# Patient Record
Sex: Female | Born: 1990 | Race: White | Hispanic: No | Smoking: Current every day smoker
Health system: Southern US, Community
[De-identification: ages and names within clinical notes are randomized; demographics above are authoritative.]

## PROBLEM LIST (undated history)

## (undated) ENCOUNTER — Emergency Department (HOSPITAL_COMMUNITY): Admission: EM | Payer: Medicaid Other | Source: Home / Self Care

## (undated) DIAGNOSIS — F329 Major depressive disorder, single episode, unspecified: Secondary | ICD-10-CM

## (undated) DIAGNOSIS — K219 Gastro-esophageal reflux disease without esophagitis: Secondary | ICD-10-CM

## (undated) DIAGNOSIS — K589 Irritable bowel syndrome without diarrhea: Secondary | ICD-10-CM

## (undated) DIAGNOSIS — R519 Headache, unspecified: Secondary | ICD-10-CM

## (undated) DIAGNOSIS — Z8614 Personal history of Methicillin resistant Staphylococcus aureus infection: Secondary | ICD-10-CM

## (undated) DIAGNOSIS — K8020 Calculus of gallbladder without cholecystitis without obstruction: Secondary | ICD-10-CM

## (undated) DIAGNOSIS — F32A Depression, unspecified: Secondary | ICD-10-CM

## (undated) DIAGNOSIS — J45909 Unspecified asthma, uncomplicated: Secondary | ICD-10-CM

## (undated) DIAGNOSIS — F419 Anxiety disorder, unspecified: Secondary | ICD-10-CM

## (undated) DIAGNOSIS — R51 Headache: Secondary | ICD-10-CM

## (undated) DIAGNOSIS — N83209 Unspecified ovarian cyst, unspecified side: Secondary | ICD-10-CM

## (undated) DIAGNOSIS — L309 Dermatitis, unspecified: Secondary | ICD-10-CM

## (undated) HISTORY — DX: Irritable bowel syndrome, unspecified: K58.9

## (undated) HISTORY — PX: WISDOM TOOTH EXTRACTION: SHX21

## (undated) HISTORY — DX: Dermatitis, unspecified: L30.9

## (undated) HISTORY — PX: TUBAL LIGATION: SHX77

## (undated) HISTORY — PX: OVARIAN CYST REMOVAL: SHX89

---
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
## 2010-03-14 ENCOUNTER — Emergency Department (HOSPITAL_COMMUNITY): Admission: EM | Admit: 2010-03-14 | Discharge: 2010-03-14 | Payer: Self-pay | Admitting: Emergency Medicine

## 2010-12-06 LAB — URINALYSIS, ROUTINE W REFLEX MICROSCOPIC
Bilirubin Urine: NEGATIVE
Glucose, UA: NEGATIVE mg/dL
Hgb urine dipstick: NEGATIVE
Nitrite: NEGATIVE
Protein, ur: NEGATIVE mg/dL

## 2010-12-06 LAB — URINE CULTURE

## 2010-12-06 LAB — URINE MICROSCOPIC-ADD ON

## 2011-02-09 ENCOUNTER — Encounter: Payer: Self-pay | Admitting: Advanced Practice Midwife

## 2011-02-09 ENCOUNTER — Encounter: Payer: Self-pay | Admitting: Obstetrics & Gynecology

## 2011-02-09 ENCOUNTER — Other Ambulatory Visit: Payer: Self-pay | Admitting: Obstetrics & Gynecology

## 2011-02-09 DIAGNOSIS — N83202 Unspecified ovarian cyst, left side: Secondary | ICD-10-CM | POA: Insufficient documentation

## 2011-02-09 MED ORDER — VICODIN 5-500 MG PO TABS: 1.0000 | ORAL_TABLET | Freq: Four times a day (QID) | ORAL | Status: DC | PRN

## 2011-02-09 NOTE — Progress Notes (Signed)
  G1P1001 had Mirena IUD placed 2/11.  Ever since then, she has experienced intermittent abdominal pain, which she describes as crampy "like a period".  She also c/o gaining 15 lbs and abd swelling.  U/S shows what appears to be a large cyst.  Dr. Despina Hidden called in for consult.

## 2011-02-09 NOTE — Progress Notes (Signed)
  Drenda Freeze Cresenzo-Dishmon,CNM requested that I perform a vaginal ultrasound on the patient. Vaginal probe ultrasound revealed a normal uterus anteverted with a Mirena IUD appropriately in the endometrial cavity the left ovary was dramatically somewhat complex cyst measuring 5.6 x 5.6 cm was present.  Another smaller cyst was also present on the ovary approximately 1-1/2 cm. Air was no free fluid in the pelvis the right ovary could not be visualized  The patient has been having increasing lower abdominal pain over the past several months she had been relating to her IUD but it is my impression that is due to the ovarian cyst. Also this complex echogenic ultrasound findings and incised and this is very unlikely to be a moderately responsive functional ovarian cyst. As a result her talking with the patient we will proceed with a I prescribed that evaluation on Wednesday, May 30 plans to do a laparoscopic left ovarian cystectomy the patient understands that she may require an oophorectomy but we will certainly try to avoid this.  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 At this point there is no need to remove the Mirena IUD and the patient was to keep it in as well all questions were answered she was scheduled for Wednesday, May 30 at [facility].

## 2011-02-12 ENCOUNTER — Other Ambulatory Visit: Payer: Self-pay | Admitting: Obstetrics & Gynecology

## 2011-02-12 ENCOUNTER — Encounter (HOSPITAL_COMMUNITY): Payer: Medicaid Other

## 2011-02-12 ENCOUNTER — Other Ambulatory Visit: Payer: Self-pay | Admitting: Infectious Diseases

## 2011-02-12 LAB — URINALYSIS, ROUTINE W REFLEX MICROSCOPIC
Ketones, ur: NEGATIVE mg/dL
Nitrite: NEGATIVE
Protein, ur: NEGATIVE mg/dL
Specific Gravity, Urine: 1.03 (ref 1.005–1.030)
Urobilinogen, UA: 0.2 mg/dL (ref 0.0–1.0)
pH: 6 (ref 5.0–8.0)

## 2011-02-12 LAB — COMPREHENSIVE METABOLIC PANEL
AST: 18 U/L (ref 0–37)
Calcium: 10.1 mg/dL (ref 8.4–10.5)
Creatinine, Ser: 0.63 mg/dL (ref 0.4–1.2)
GFR calc Af Amer: >60 mL/min (ref >60)
Glucose, Bld: 105 mg/dL — ABNORMAL HIGH (ref 70–99)
Sodium: 141 mEq/L (ref 135–145)
Total Protein: 6.4 g/dL (ref 6.0–8.3)

## 2011-02-12 LAB — CBC
Hemoglobin: 12.7 g/dL (ref 12.0–15.0)
MCH: 30.5 pg (ref 26.0–34.0)
MCHC: 33.7 g/dL (ref 30.0–36.0)
RBC: 4.16 MIL/uL (ref 3.87–5.11)
WBC: 10.8 10*3/uL — ABNORMAL HIGH (ref 4.0–10.5)

## 2011-02-12 LAB — URINE MICROSCOPIC-ADD ON

## 2011-02-16 ENCOUNTER — Encounter: Payer: Self-pay | Admitting: Obstetrics & Gynecology

## 2011-02-17 ENCOUNTER — Ambulatory Visit (HOSPITAL_COMMUNITY)
Admission: RE | Admit: 2011-02-17 | Discharge: 2011-02-17 | Disposition: A | Discharge status: Home / Self Care | Payer: Medicaid Other | Source: Ambulatory Visit | Attending: Obstetrics & Gynecology | Admitting: Obstetrics & Gynecology

## 2011-02-17 ENCOUNTER — Other Ambulatory Visit: Payer: Self-pay | Admitting: Obstetrics & Gynecology

## 2011-02-17 DIAGNOSIS — N83209 Unspecified ovarian cyst, unspecified side: Secondary | ICD-10-CM | POA: Insufficient documentation

## 2011-03-03 NOTE — Op Note (Signed)
Karen, Shields              ACCOUNT NO.:  [id number]  MEDICAL RECORD NO.:  [id number]           PATIENT TYPE:  O  LOCATION:  DAYP                          FACILITY:  [facility]  PHYSICIAN:  Lazaro Arms, M.D.   DATE OF BIRTH:  12-01-1990  DATE OF PROCEDURE:  02/17/2011 DATE OF DISCHARGE:                              OPERATIVE REPORT   PREOPERATIVE DIAGNOSIS:  A 9-cm left ovarian cyst, complex.  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  PROCEDURE:  Laparoscopic left ovarian cystectomy.  SURGEON:  Lazaro Arms, MD  ANESTHESIA:  General endotracheal.  FINDINGS:  The patient had a 9-cm left ovarian cyst complex found in the office.  It was heterogeneous, no internal septations.  I thought it was probably consistent with a serous cystadenoma.  Today, at surgery that was confirmed.  It was indeed in the left, and I think it was a serous cystadenoma.  The right ovary had a small probably 2 cm simple cyst onit, which was benign.  The uterus was normal.  The intraperitoneal cavity was otherwise normal.  DESCRIPTION OF OPERATION:  The patient was taken to the operating room, placed in supine position where she underwent general endotracheal anesthesia.  She was then placed in lithotomy position, prepped and draped in usual sterile fashion.  A Foley catheter was placed.  Incision was made in the umbilicus, carried down sharply to rectus fascia. Veress needle was placed in peritoneal cavity with one pass without any difficulty.  The peritoneal cavity was insufflated.  A non-bladed video laparoscope trocar was then placed into the peritoneal cavity with one pass without any difficulty, and incision was made in the lower left lower quadrant and also in the midline just above the pubis and non- bladed trocars were then placed into the peritoneal cavity under direct visualization without difficulty.  The Harmonic scalpel counter traction and pressurized  hydrodissection was used and the ovarian shell, cortex shell was opened up with no cyst rupture.  I was able to basically bivalve the ovary and use the hydrodissection in an avascular fashion to shell out the ovarian cyst.  I then ruptured the cyst and removed the fluid and retrieved the ovarian cyst using the EndoCatch without difficulty.  The entire cyst was removed.  The ovarian bed where the cyst was bleeding minimally, little pressure took care of that and no ovarian tissue had to be removed during the procedure.  After I found it to be hemostatic, I folded it back together and put it down in the posterior cul-de-sac and left fluid in there to try to allow healing without adhesion formation.  There was a small probably 2-3 cm simple cyst of the right ovary, which was ovarian was just opened and the fluid removed, and it was straw colored fluid consistent with a corpus luteum. The patient does have an IUD in place.  The instruments were then removed.  All the gas was allowed to escape.  The umbilical fascia was closed with single 0 Vicryl suture, and the skin staples were closed with staples.  Unfortunately, I was going to close it with  subcu suture and Dermabond, but they were all sort of the oozing, so I brought it in her best interest to close it with staples.  The patient tolerated the procedure well.  She experienced about 50 mL blood loss and was taken to the recovery room in good stable condition.  All counts were correct.  She received Ancef and Toradol preoperatively prophylactically.     Lazaro Arms, M.D.     Loraine Maple  D:  02/17/2011  T:  02/18/2011  Job:  [id number]  Electronically Signed by Duane Lope M.D. on 03/03/2011 02:51:08 PM

## 2011-06-11 LAB — URINALYSIS, ROUTINE W REFLEX MICROSCOPIC
Bilirubin Urine: NEGATIVE
Glucose, UA: NEGATIVE
Nitrite: POSITIVE — AB
Protein, ur: NEGATIVE
pH: 7

## 2011-06-11 LAB — BASIC METABOLIC PANEL
CO2: 23
Calcium: 8.8
Chloride: 108
Glucose, Bld: 75
Sodium: 135

## 2011-06-11 LAB — URINE MICROSCOPIC-ADD ON

## 2011-06-15 LAB — URINALYSIS, ROUTINE W REFLEX MICROSCOPIC
Glucose, UA: NEGATIVE
Nitrite: NEGATIVE
Protein, ur: NEGATIVE
Urobilinogen, UA: 0.2

## 2011-06-15 LAB — URINE MICROSCOPIC-ADD ON

## 2011-06-18 LAB — CBC
HCT: 39.1
MCHC: 34.2
MCV: 91.6
RDW: 12.2
WBC: 10

## 2011-06-18 LAB — WET PREP, GENITAL

## 2011-06-18 LAB — URINALYSIS, ROUTINE W REFLEX MICROSCOPIC
Bilirubin Urine: NEGATIVE
Nitrite: NEGATIVE

## 2011-06-18 LAB — DIFFERENTIAL
Basophils Absolute: 0
Eosinophils Absolute: 0.5
Lymphocytes Relative: 35
Monocytes Absolute: 0.8
Monocytes Relative: 8
Neutro Abs: 5.2
Neutrophils Relative %: 52

## 2011-06-18 LAB — BASIC METABOLIC PANEL: Chloride: 108

## 2011-06-18 LAB — URINE MICROSCOPIC-ADD ON

## 2011-06-18 LAB — RPR: RPR Ser Ql: NONREACTIVE

## 2011-08-31 ENCOUNTER — Emergency Department (HOSPITAL_COMMUNITY)
Admission: EM | Admit: 2011-08-31 | Discharge: 2011-09-01 | Disposition: A | Discharge status: Home / Self Care | Payer: BC Managed Care – PPO | Attending: Emergency Medicine | Admitting: Emergency Medicine

## 2011-08-31 ENCOUNTER — Encounter (HOSPITAL_COMMUNITY): Payer: Self-pay | Admitting: *Deleted

## 2011-08-31 DIAGNOSIS — F17200 Nicotine dependence, unspecified, uncomplicated: Secondary | ICD-10-CM | POA: Insufficient documentation

## 2011-08-31 DIAGNOSIS — R1032 Left lower quadrant pain: Secondary | ICD-10-CM | POA: Insufficient documentation

## 2011-08-31 DIAGNOSIS — J45909 Unspecified asthma, uncomplicated: Secondary | ICD-10-CM | POA: Insufficient documentation

## 2011-08-31 HISTORY — DX: Unspecified ovarian cyst, unspecified side: N83.209

## 2011-08-31 NOTE — ED Notes (Signed)
Reports history of ovarian cycts, and bladder pressure.  Reports that she presently is experiencing same cramping as previously experienced with cysts.

## 2011-09-01 ENCOUNTER — Encounter (HOSPITAL_COMMUNITY): Payer: Self-pay | Admitting: Emergency Medicine

## 2011-09-01 LAB — URINALYSIS, ROUTINE W REFLEX MICROSCOPIC
Bilirubin Urine: NEGATIVE
Glucose, UA: NEGATIVE mg/dL
Nitrite: NEGATIVE
Specific Gravity, Urine: >1.03 — ABNORMAL HIGH (ref 1.005–1.030)
pH: 5.5 (ref 5.0–8.0)

## 2011-09-01 LAB — URINE MICROSCOPIC-ADD ON

## 2011-09-01 LAB — PREGNANCY, URINE: Preg Test, Ur: NEGATIVE

## 2011-09-01 MED ORDER — IBUPROFEN 800 MG PO TABS: 800.0000 mg | ORAL_TABLET | Freq: Three times a day (TID) | ORAL | Status: AC

## 2011-09-01 MED ORDER — ONDANSETRON HCL 4 MG PO TABS
4.0000 mg | ORAL_TABLET | Freq: Once | ORAL | Status: AC
  Administered 2011-09-01: 4 mg via ORAL
  Filled 2011-09-01: qty 1

## 2011-09-01 MED ORDER — HYDROCODONE-ACETAMINOPHEN 5-325 MG PO TABS
2.0000 | ORAL_TABLET | Freq: Once | ORAL | Status: AC
  Administered 2011-09-01: 2 via ORAL
  Filled 2011-09-01: qty 2

## 2011-09-01 MED ORDER — IBUPROFEN 800 MG PO TABS
800.0000 mg | ORAL_TABLET | Freq: Once | ORAL | Status: AC
  Administered 2011-09-01: 800 mg via ORAL
  Filled 2011-09-01: qty 1

## 2011-09-01 MED ORDER — HYDROCODONE-ACETAMINOPHEN 5-325 MG PO TABS: ORAL_TABLET | ORAL | Status: DC

## 2011-09-01 NOTE — ED Provider Notes (Signed)
History     CSN: [id number] Arrival date & time: 08/31/2011 11:57 PM   None     Chief Complaint  Patient presents with  . Pelvic Pain    (Consider location/radiation/quality/duration/timing/severity/associated sxs/prior treatment) HPI Comments: Patient reports cramping type pain in the left lower abdomen similar to previous cramping type pain with ovarian cyst. The patient denies any fever or injury to the lower abdomen area. She has a pressure sensation in the bladder area. Denies hematuria. Pain is currently 6/10.  Patient is a 20 y.o. female presenting with pelvic pain. The history is provided by the patient.  Pelvic Pain The current episode started more than 1 month ago. The problem occurs intermittently. The problem has been unchanged. Associated symptoms include abdominal pain. Pertinent negatives include no arthralgias, chest pain, coughing, nausea, neck pain or vomiting. The symptoms are aggravated by walking. She has tried nothing for the symptoms. The treatment provided no relief.    Past Medical History  Diagnosis Date  . Eczema 2008  . Asthma   . Ovarian cyst     History reviewed. No pertinent past surgical history.  Family History  Problem Relation Age of Onset  . Diabetes Mother     History  Substance Use Topics  . Smoking status: Current Everyday Smoker -- 0.1 packs/day for 5 years    Types: Cigarettes  . Smokeless tobacco: Never Used  . Alcohol Use: No    OB History    Grav Para Term Preterm Abortions TAB SAB Ect Mult Living   1 1 0 0 0 0 0 0 0 1       Review of Systems  Constitutional: Negative for activity change.       All ROS Neg except as noted in HPI  HENT: Negative for nosebleeds and neck pain.   Eyes: Negative for photophobia and discharge.  Respiratory: Negative for cough, shortness of breath and wheezing.   Cardiovascular: Negative for chest pain and palpitations.  Gastrointestinal: Positive for abdominal pain. Negative for nausea,  vomiting and blood in stool.  Genitourinary: Positive for pelvic pain. Negative for dysuria, frequency and hematuria.  Musculoskeletal: Negative for back pain and arthralgias.  Skin: Negative.   Neurological: Negative for dizziness, seizures and speech difficulty.  Psychiatric/Behavioral: Negative for hallucinations and confusion.    Allergies  Review of patient's allergies indicates no known allergies.  Home Medications   Current Outpatient Rx  Name Route Sig Dispense Refill  . ALPRAZOLAM 0.5 MG PO TABS Oral Take 0.5 mg by mouth at bedtime as needed.      . ATOMOXETINE HCL 80 MG PO CAPS Oral Take 80 mg by mouth daily.      Marland Kitchen ESCITALOPRAM OXALATE 10 MG PO TABS Oral Take 20 mg by mouth daily.      Marland Kitchen VICODIN 5-500 MG PO TABS Oral Take 1 tablet by mouth every 6 (six) hours as needed for pain. 24 tablet 0    Dispense as written.    BP 134/76  Pulse 80  Temp 98.6 F (37 C)  Resp 18  Ht 5\' 4"  (1.626 m)  Wt 145 lb (65.772 kg)  BMI 24.89 kg/m2  SpO2 100%  LMP 08/17/2011  Physical Exam  Nursing note and vitals reviewed. Constitutional: She is oriented to person, place, and time. She appears well-developed and well-nourished.  Non-toxic appearance.  HENT:  Head: Normocephalic.  Right Ear: Tympanic membrane and external ear normal.  Left Ear: Tympanic membrane and external ear normal.  Eyes: EOM and  lids are normal. Pupils are equal, round, and reactive to light.  Neck: Normal range of motion. Neck supple. Carotid bruit is not present.  Cardiovascular: Normal rate, regular rhythm, normal heart sounds, intact distal pulses and normal pulses.   Pulmonary/Chest: Breath sounds normal. No respiratory distress.  Abdominal: Soft. Bowel sounds are normal. There is no tenderness. There is no guarding and no CVA tenderness.       Mild-to-moderate left lower quadrant abdomen pain extending to the supra-pubic area. No guarding and no rebound.  Musculoskeletal: Normal range of motion.    Lymphadenopathy:       Head (right side): No submandibular adenopathy present.       Head (left side): No submandibular adenopathy present.    She has no cervical adenopathy.  Neurological: She is alert and oriented to person, place, and time. She has normal strength. No cranial nerve deficit or sensory deficit.  Skin: Skin is warm and dry.  Psychiatric: She has a normal mood and affect. Her speech is normal.    ED Course  Procedures (including critical care time)  Labs Reviewed - No data to display No results found.   UJ:WJXB Lower abd pain   MDM  I have examined the patient reviewed the vital signs and the lab work for today's visit. The patient presented with pain similar to ovarian cyst pain in the pass she has not had nausea or vomiting. She's not had hematuria. There is no diarrhea or constipation reported. No fever. No history of trauma. During the visit in the emergency room the patient has been calm has been conversing with family and takes things on the phone without major pain or complication. The urine pregnancy test is negative. The urinalysis is negative for urinary tract infection and or kidney stone. Prescription for ibuprofen 800 mg 13 times daily and Norco one every 4 hours for pain given. Patient to see her OB/GYN physician or physician at the [facility] clinic for ultrasound evaluation. Patient to return to the emergency department if any changes problems or concerns.        Kathie Dike, [place] 09/01/11 [phone]

## 2011-09-01 NOTE — ED Provider Notes (Signed)
Medical screening examination/treatment/procedure(s) were performed by non-physician practitioner and as supervising physician I was immediately available for consultation/collaboration.   Shelda Jakes, MD 09/01/11 [phone]

## 2011-09-03 ENCOUNTER — Emergency Department (HOSPITAL_COMMUNITY): Payer: BC Managed Care – PPO

## 2011-09-03 ENCOUNTER — Encounter (HOSPITAL_COMMUNITY): Payer: Self-pay

## 2011-09-03 ENCOUNTER — Emergency Department (HOSPITAL_COMMUNITY)
Admission: EM | Admit: 2011-09-03 | Discharge: 2011-09-03 | Disposition: A | Discharge status: Home / Self Care | Payer: BC Managed Care – PPO | Attending: Emergency Medicine | Admitting: Emergency Medicine

## 2011-09-03 DIAGNOSIS — J45909 Unspecified asthma, uncomplicated: Secondary | ICD-10-CM | POA: Insufficient documentation

## 2011-09-03 DIAGNOSIS — R1031 Right lower quadrant pain: Secondary | ICD-10-CM | POA: Insufficient documentation

## 2011-09-03 DIAGNOSIS — F17200 Nicotine dependence, unspecified, uncomplicated: Secondary | ICD-10-CM | POA: Insufficient documentation

## 2011-09-03 DIAGNOSIS — N83209 Unspecified ovarian cyst, unspecified side: Secondary | ICD-10-CM | POA: Insufficient documentation

## 2011-09-03 LAB — CBC
MCH: 31.2 pg (ref 26.0–34.0)
MCHC: 33.9 g/dL (ref 30.0–36.0)
Platelets: 340 10*3/uL (ref 150–400)
RBC: 4.43 MIL/uL (ref 3.87–5.11)

## 2011-09-03 LAB — DIFFERENTIAL
Basophils Relative: 0 % (ref 0–1)
Eosinophils Absolute: 0.1 10*3/uL (ref 0.0–0.7)
Neutro Abs: 8.4 10*3/uL — ABNORMAL HIGH (ref 1.7–7.7)
Neutrophils Relative %: 74 % (ref 43–77)

## 2011-09-03 LAB — URINALYSIS, ROUTINE W REFLEX MICROSCOPIC: Protein, ur: NEGATIVE mg/dL

## 2011-09-03 LAB — BASIC METABOLIC PANEL
BUN: 5 mg/dL — ABNORMAL LOW (ref 6–23)
Calcium: 9.7 mg/dL (ref 8.4–10.5)
GFR calc Af Amer: >90 mL/min (ref >90)
GFR calc non Af Amer: >90 mL/min (ref >90)
Glucose, Bld: 92 mg/dL (ref 70–99)
Sodium: 139 mEq/L (ref 135–145)

## 2011-09-03 MED ORDER — ONDANSETRON HCL 4 MG/2ML IJ SOLN
4.0000 mg | Freq: Once | INTRAMUSCULAR | Status: AC
  Administered 2011-09-03: 4 mg via INTRAVENOUS
  Filled 2011-09-03: qty 2

## 2011-09-03 MED ORDER — OXYCODONE-ACETAMINOPHEN 5-325 MG PO TABS: 2.0000 | ORAL_TABLET | ORAL | Status: AC | PRN

## 2011-09-03 MED ORDER — SODIUM CHLORIDE 0.9 % IV SOLN: INTRAVENOUS | Status: DC

## 2011-09-03 MED ORDER — ONDANSETRON HCL 4 MG PO TABS: 4.0000 mg | ORAL_TABLET | Freq: Four times a day (QID) | ORAL | Status: AC

## 2011-09-03 MED ORDER — IOHEXOL 300 MG/ML  SOLN
100.0000 mL | Freq: Once | INTRAMUSCULAR | Status: AC | PRN
  Administered 2011-09-03: 100 mL via INTRAVENOUS

## 2011-09-03 MED ORDER — SODIUM CHLORIDE 0.9 % IV BOLUS (SEPSIS)
500.0000 mL | Freq: Once | INTRAVENOUS | Status: AC
  Administered 2011-09-03: 500 mL via INTRAVENOUS

## 2011-09-03 MED ORDER — FENTANYL CITRATE 0.05 MG/ML IJ SOLN
50.0000 ug | Freq: Once | INTRAMUSCULAR | Status: AC
  Administered 2011-09-03: 50 ug via INTRAVENOUS
  Filled 2011-09-03: qty 2

## 2011-09-03 NOTE — ED Notes (Signed)
Pt to US.

## 2011-09-03 NOTE — ED Provider Notes (Signed)
Scribed for Donnetta Hutching, MD, the patient was seen in room [facility] . This chart was scribed by Ellie Lunch.   CSN: [id number] Arrival date & time: 09/03/2011  3:40 PM   First MD Initiated Contact with Patient 09/03/11 1549      Chief Complaint  Patient presents with  . Abdominal Pain    (Consider location/radiation/quality/duration/timing/severity/associated sxs/prior treatment) HPI KINZA GOUVEIA is a 20 y.o. female who presents to the Emergency Department complaining of 1 month of intermittent RLQ abdominal. Pain does not radiate and is not associated with vaginal discharge, vaginal bleeding, n/v/d, or dysuria. Pt seen for pain in ED 4 days ago and was discharged with planned follow up at [facility] hospital for [place]. Pt says she returns to ED b/c the pain has gradually worsened in the past 4 days. Pt treats pain with hydrocodone (Rx'd in ED) with mild improvement. Pt reports h/o of similar Sx 7 months ago when she was Dx with L ovarian cyst. Pt had cyst removed by 02/17/2011 by Dr. Despina Hidden. There are no other associated symptoms and no other alleviating or aggravating factors.    Past Medical History  Diagnosis Date  . Eczema 2008  . Asthma   . Ovarian cyst       History reviewed. No pertinent past surgical history. 5/30 Laparoscopic cystectomy on L. Ovary  Family History  Problem Relation Age of Onset  . Diabetes Mother     History  Substance Use Topics  . Smoking status: Current Everyday Smoker -- 0.1 packs/day for 5 years    Types: Cigarettes  . Smokeless tobacco: Never Used  . Alcohol Use: No    OB History    Grav Para Term Preterm Abortions TAB SAB Ect Mult Living   1 1 0 0 0 0 0 0 0 1       Review of Systems 10 Systems reviewed and are negative for acute change except as noted in the HPI.   Allergies  Review of patient's allergies indicates no known allergies.  Home Medications   Current Outpatient Rx  Name Route Sig Dispense Refill  . ALPRAZOLAM 0.5 MG  PO TABS Oral Take 0.5 mg by mouth at bedtime as needed.      . ATOMOXETINE HCL 80 MG PO CAPS Oral Take 80 mg by mouth daily.      Marland Kitchen ESCITALOPRAM OXALATE 10 MG PO TABS Oral Take 20 mg by mouth daily.      Marland Kitchen HYDROCODONE-ACETAMINOPHEN 5-325 MG PO TABS  1 po q4h prn pain 15 tablet 0  . IBUPROFEN 800 MG PO TABS Oral Take 1 tablet (800 mg total) by mouth 3 (three) times daily. 21 tablet 0  . VICODIN 5-500 MG PO TABS Oral Take 1 tablet by mouth every 6 (six) hours as needed for pain. 24 tablet 0    Dispense as written.    BP 143/76  Pulse 92  Temp(Src) 98.2 F (36.8 C) (Oral)  Resp 20  SpO2 100%  LMP 08/17/2011  Physical Exam  Nursing note and vitals reviewed. Constitutional: She is oriented to person, place, and time. She appears well-developed and well-nourished.  HENT:  Head: Normocephalic and atraumatic.  Eyes: Conjunctivae and EOM are normal. Pupils are equal, round, and reactive to light.  Neck: Normal range of motion. Neck supple.  Cardiovascular: Normal rate and regular rhythm.   Pulmonary/Chest: Effort normal and breath sounds normal.  Abdominal: Soft. Bowel sounds are normal. There is tenderness.       Minimal  tenderness RLQ  Musculoskeletal: Normal range of motion.  Neurological: She is alert and oriented to person, place, and time.  Skin: Skin is warm and dry.  Psychiatric: She has a normal mood and affect.    ED Course  Procedures (including critical care time) DIAGNOSTIC STUDIES: Oxygen Saturation is 100% on room air, normal by my interpretation.    COORDINATION OF CARE:  Labs Reviewed  BASIC METABOLIC PANEL - Abnormal; Notable for the following:    Potassium 3.4 (*)    BUN 5 (*)    All other components within normal limits  CBC - Abnormal; Notable for the following:    WBC 11.4 (*)    All other components within normal limits  DIFFERENTIAL - Abnormal; Notable for the following:    Neutro Abs 8.4 (*)    All other components within normal limits  URINALYSIS,  ROUTINE W REFLEX MICROSCOPIC  PREGNANCY, URINE   US Transvaginal Non-ob 09/03/2011 IMPRESSION:  1.  Cystic structure or structures anterior to the uterus in the midline of the pelvis.  It is not clear whether this represents a bladder abnormality, bowel abnormality or possible adnexal pathology.  Further evaluation with CT of the abdomen and pelvis with contrast is recommended. 2.  Normal-appearing uterus and right ovary. 3.  Left ovary appears normal but is not well seen.  Original Report Authenticated By: Patterson Hammersmith, M.D.   US Pelvis Complete 09/03/2011 IMPRESSION:  1.  Cystic structure or structures anterior to the uterus in the midline of the pelvis.  It is not clear whether this represents a bladder abnormality, bowel abnormality or possible adnexal pathology.  Further evaluation with CT of the abdomen and pelvis with contrast is recommended. 2.  Normal-appearing uterus and right ovary. 3.  Left ovary appears normal but is not well seen.  Original Report Authenticated By: Patterson Hammersmith, M.D.    ED MEDICATIONS  Medications  ondansetron [facility]) injection 4 mg  fentaNYL (SUBLIMAZE) injection 50 mcg   sodium chloride 0.9 % bolus 500 mL  0.9 %  sodium chloride infusion     No diagnosis found.    MDM  Patient is ambulatory with no acute abdomen. CT scan shows corpus luteum cyst and free fluid in the pelvis. Normal vital signs.  Discharge home with pain medicine and nausea medicine   I personally performed the services described in this documentation, which was scribed in my presence. The recorded information has been reviewed and considered.         Donnetta Hutching, MD 09/03/11 2251

## 2011-09-03 NOTE — ED Notes (Signed)
Pt c/o pain in her right lower quadrant off and on since October. Pt denies nausea, vomiting, diarrhea, urinary symptoms and vaginal discharge. States that the pain is worse when she lies down. Pt alert and oriented x 3. Skin warm and dry. Color pink. Breath sounds clear and equal bilaterally. Sitting on stretcher texting. Family at bedside.

## 2011-09-03 NOTE — ED Notes (Signed)
Pt presents with RLQ abd pain. Pt states she was recently seen by [facility] and was supposed to get and OP [place]. Pt states her OB/GYN wont be able to see her until Monday. Pt is requesting an [place] today.

## 2011-09-03 NOTE — ED Notes (Signed)
Pt ready for ct

## 2011-09-03 NOTE — ED Notes (Signed)
Ct states it will be 9:30 pm before they can do abdominal ct.

## 2011-10-01 ENCOUNTER — Encounter (HOSPITAL_COMMUNITY): Payer: Self-pay

## 2011-10-01 DIAGNOSIS — N83209 Unspecified ovarian cyst, unspecified side: Secondary | ICD-10-CM | POA: Insufficient documentation

## 2011-10-01 DIAGNOSIS — J45909 Unspecified asthma, uncomplicated: Secondary | ICD-10-CM | POA: Insufficient documentation

## 2011-10-01 DIAGNOSIS — N949 Unspecified condition associated with female genital organs and menstrual cycle: Secondary | ICD-10-CM | POA: Insufficient documentation

## 2011-10-01 DIAGNOSIS — R1031 Right lower quadrant pain: Secondary | ICD-10-CM | POA: Insufficient documentation

## 2011-10-01 LAB — URINALYSIS, ROUTINE W REFLEX MICROSCOPIC
Glucose, UA: NEGATIVE mg/dL
Hgb urine dipstick: NEGATIVE
Leukocytes, UA: NEGATIVE
Specific Gravity, Urine: 1.015 (ref 1.005–1.030)
pH: 6.5 (ref 5.0–8.0)

## 2011-10-01 LAB — PREGNANCY, URINE: Preg Test, Ur: NEGATIVE

## 2011-10-01 NOTE — ED Notes (Signed)
Pt presents with right low abdominal/ pelvic pain x 4 days. Pt states she had a ruptured ovarian cyst the last time she was here and this time it feels the same.

## 2011-10-02 ENCOUNTER — Emergency Department (HOSPITAL_COMMUNITY)
Admission: EM | Admit: 2011-10-02 | Discharge: 2011-10-02 | Disposition: A | Discharge status: Home / Self Care | Payer: BC Managed Care – PPO | Attending: Emergency Medicine | Admitting: Emergency Medicine

## 2011-10-02 DIAGNOSIS — R109 Unspecified abdominal pain: Secondary | ICD-10-CM

## 2011-10-02 DIAGNOSIS — N83209 Unspecified ovarian cyst, unspecified side: Secondary | ICD-10-CM

## 2011-10-02 LAB — DIFFERENTIAL
Basophils Absolute: 0 10*3/uL (ref 0.0–0.1)
Basophils Relative: 0 % (ref 0–1)
Lymphocytes Relative: 37 % (ref 12–46)
Monocytes Absolute: 0.9 10*3/uL (ref 0.1–1.0)
Monocytes Relative: 8 % (ref 3–12)
Neutro Abs: 6.1 10*3/uL (ref 1.7–7.7)
Neutrophils Relative %: 52 % (ref 43–77)

## 2011-10-02 LAB — CBC
HCT: 38.9 % (ref 36.0–46.0)
Hemoglobin: 13.3 g/dL (ref 12.0–15.0)
WBC: 11.7 10*3/uL — ABNORMAL HIGH (ref 4.0–10.5)

## 2011-10-02 MED ORDER — HYDROCODONE-ACETAMINOPHEN 5-325 MG PO TABS
2.0000 | ORAL_TABLET | Freq: Once | ORAL | Status: AC
  Administered 2011-10-02: 2 via ORAL
  Filled 2011-10-02: qty 2

## 2011-10-02 MED ORDER — HYDROCODONE-ACETAMINOPHEN 5-500 MG PO TABS: 1.0000 | ORAL_TABLET | Freq: Four times a day (QID) | ORAL | Status: AC | PRN

## 2011-10-02 NOTE — ED Notes (Signed)
Pt stable at discharge and ambulatory. Verbalizes understanding of discharge instructions

## 2011-10-02 NOTE — ED Provider Notes (Signed)
History     CSN: [id number]  Arrival date & time 10/01/11  2200   First MD Initiated Contact with Patient 10/02/11 0144      Chief Complaint  Patient presents with  . Pelvic Pain    (Consider location/radiation/quality/duration/timing/severity/associated sxs/prior treatment) HPI Comments: Was diagnosed with ovarian cyst rupture last month, got better.  Pain returned 3-4 days ago.  No fevers or chills.  No urinary complaints.    Patient is a 21 y.o. female presenting with abdominal pain. The history is provided by the patient.  Abdominal Pain The primary symptoms of the illness include abdominal pain. The primary symptoms of the illness do not include fatigue, nausea, vomiting, diarrhea, dysuria or vaginal discharge. The current episode started more than 2 days ago. The onset of the illness was sudden. The problem has not changed since onset. The patient states that she believes she is currently not pregnant. The patient has not had a change in bowel habit. Symptoms associated with the illness do not include anorexia, constipation, urgency, hematuria or frequency.    Past Medical History  Diagnosis Date  . Eczema 2008  . Asthma   . Ovarian cyst     History reviewed. No pertinent past surgical history.  Family History  Problem Relation Age of Onset  . Diabetes Mother     History  Substance Use Topics  . Smoking status: Current Everyday Smoker -- 0.1 packs/day for 5 years    Types: Cigarettes  . Smokeless tobacco: Never Used  . Alcohol Use: No    OB History    Grav Para Term Preterm Abortions TAB SAB Ect Mult Living   1 1 0 0 0 0 0 0 0 1       Review of Systems  Constitutional: Negative for fatigue.  Gastrointestinal: Positive for abdominal pain. Negative for nausea, vomiting, diarrhea, constipation and anorexia.  Genitourinary: Negative for dysuria, urgency, frequency, hematuria and vaginal discharge.  All other systems reviewed and are negative.    Allergies    Review of patient's allergies indicates no known allergies.  Home Medications   Current Outpatient Rx  Name Route Sig Dispense Refill  . ALPRAZOLAM 0.5 MG PO TABS Oral Take 0.5 mg by mouth at bedtime as needed. For sleep/anxiety    . HYDROCODONE-ACETAMINOPHEN 5-325 MG PO TABS Oral Take 0.5-1 tablets by mouth every 4 (four) hours as needed. 1 po q4h prn pain       BP 117/70  Pulse 77  Temp(Src) 98.6 F (37 C) (Oral)  Resp 20  Ht 5\' 3"  (1.6 m)  Wt 140 lb (63.504 kg)  BMI 24.80 kg/m2  SpO2 100%  LMP 09/18/2011  Physical Exam  Nursing note and vitals reviewed. Constitutional: She is oriented to person, place, and time. She appears well-developed and well-nourished. No distress.  HENT:  Head: Normocephalic and atraumatic.  Neck: Normal range of motion. Neck supple.  Cardiovascular: Normal rate and regular rhythm.  Exam reveals no gallop and no friction rub.   No murmur heard. Pulmonary/Chest: Effort normal and breath sounds normal. No respiratory distress. She has no wheezes.  Abdominal: Soft. Bowel sounds are normal. She exhibits no distension. There is no tenderness.       Very mild rlq ttp.  No rebound or guarding.  Musculoskeletal: Normal range of motion.  Neurological: She is alert and oriented to person, place, and time.  Skin: Skin is warm and dry. She is not diaphoretic.    ED Course  Procedures (including critical  care time)  Labs Reviewed  URINALYSIS, ROUTINE W REFLEX MICROSCOPIC - Abnormal; Notable for the following:    APPearance HAZY (*)    All other components within normal limits  PREGNANCY, URINE   No results found.   No diagnosis found.    MDM  The wbc is mildly elevated.  I am unsure as to why.  I will discharge her to home using the wbc as a comparison should she worsen.  There is no significant ttp at McBurney's point and her symptoms are atypical for appendicitis.  I will prescribe pain medications and arrange an ultrasound for Monday.  She is  to return if she worsens or becomes febrile.          Geoffery Lyons, MD 10/02/11 [phone]

## 2011-10-02 NOTE — ED Notes (Signed)
Triage reassessment:  Patient continues to c/o right lower abdominal pain; states had a cyst rupture 2 weeks ago.

## 2011-10-04 ENCOUNTER — Other Ambulatory Visit (HOSPITAL_COMMUNITY): Payer: Self-pay | Admitting: Emergency Medicine

## 2011-10-04 ENCOUNTER — Ambulatory Visit (HOSPITAL_COMMUNITY)
Admission: RE | Admit: 2011-10-04 | Discharge: 2011-10-04 | Disposition: A | Discharge status: Home / Self Care | Payer: BC Managed Care – PPO | Source: Ambulatory Visit | Attending: Emergency Medicine | Admitting: Emergency Medicine

## 2011-10-04 ENCOUNTER — Inpatient Hospital Stay (HOSPITAL_COMMUNITY): Admit: 2011-10-04 | Payer: Medicaid Other

## 2011-10-04 DIAGNOSIS — R52 Pain, unspecified: Secondary | ICD-10-CM

## 2011-10-04 DIAGNOSIS — R9389 Abnormal findings on diagnostic imaging of other specified body structures: Secondary | ICD-10-CM | POA: Insufficient documentation

## 2011-10-04 DIAGNOSIS — R1011 Right upper quadrant pain: Secondary | ICD-10-CM | POA: Insufficient documentation

## 2011-12-17 ENCOUNTER — Emergency Department (HOSPITAL_COMMUNITY): Payer: Managed Care, Other (non HMO)

## 2011-12-17 ENCOUNTER — Encounter (HOSPITAL_COMMUNITY): Payer: Self-pay

## 2011-12-17 ENCOUNTER — Emergency Department (HOSPITAL_COMMUNITY)
Admission: EM | Admit: 2011-12-17 | Discharge: 2011-12-17 | Disposition: A | Discharge status: Home / Self Care | Payer: Managed Care, Other (non HMO) | Attending: Emergency Medicine | Admitting: Emergency Medicine

## 2011-12-17 DIAGNOSIS — R109 Unspecified abdominal pain: Secondary | ICD-10-CM | POA: Insufficient documentation

## 2011-12-17 DIAGNOSIS — R10819 Abdominal tenderness, unspecified site: Secondary | ICD-10-CM | POA: Insufficient documentation

## 2011-12-17 DIAGNOSIS — F17200 Nicotine dependence, unspecified, uncomplicated: Secondary | ICD-10-CM | POA: Insufficient documentation

## 2011-12-17 DIAGNOSIS — Z975 Presence of (intrauterine) contraceptive device: Secondary | ICD-10-CM | POA: Insufficient documentation

## 2011-12-17 DIAGNOSIS — K5900 Constipation, unspecified: Secondary | ICD-10-CM | POA: Insufficient documentation

## 2011-12-17 DIAGNOSIS — N949 Unspecified condition associated with female genital organs and menstrual cycle: Secondary | ICD-10-CM | POA: Insufficient documentation

## 2011-12-17 DIAGNOSIS — J45909 Unspecified asthma, uncomplicated: Secondary | ICD-10-CM | POA: Insufficient documentation

## 2011-12-17 LAB — URINALYSIS, ROUTINE W REFLEX MICROSCOPIC
Ketones, ur: NEGATIVE mg/dL
Leukocytes, UA: NEGATIVE
Nitrite: NEGATIVE
Protein, ur: NEGATIVE mg/dL
Urobilinogen, UA: 0.2 mg/dL (ref 0.0–1.0)

## 2011-12-17 LAB — WET PREP, GENITAL: Yeast Wet Prep HPF POC: NONE SEEN

## 2011-12-17 LAB — POCT PREGNANCY, URINE: Preg Test, Ur: NEGATIVE

## 2011-12-17 MED ORDER — POLYETHYLENE GLYCOL 3350 17 GM/SCOOP PO POWD: 17.0000 g | Freq: Every day | ORAL | Status: AC

## 2011-12-17 MED ORDER — NAPROXEN 500 MG PO TABS: 500.0000 mg | ORAL_TABLET | Freq: Two times a day (BID) | ORAL | Status: DC

## 2011-12-17 MED ORDER — OXYCODONE-ACETAMINOPHEN 5-325 MG PO TABS
2.0000 | ORAL_TABLET | Freq: Once | ORAL | Status: AC
  Administered 2011-12-17: 2 via ORAL
  Filled 2011-12-17: qty 2

## 2011-12-17 NOTE — Discharge Instructions (Signed)
Abdominal Pain Abdominal pain can be caused by many things. Your caregiver decides the seriousness of your pain by an examination and possibly blood tests and X-rays. Many cases can be observed and treated at home. Most abdominal pain is not caused by a disease and will probably improve without treatment. However, in many cases, more time must pass before a clear cause of the pain can be found. Before that point, it may not be known if you need more testing, or if hospitalization or surgery is needed. HOME CARE INSTRUCTIONS   Do not take laxatives unless directed by your caregiver.   Take pain medicine only as directed by your caregiver.   Only take over-the-counter or prescription medicines for pain, discomfort, or fever as directed by your caregiver.   Try a clear liquid diet (broth, tea, or water) for as long as directed by your caregiver. Slowly move to a bland diet as tolerated.  SEEK IMMEDIATE MEDICAL CARE IF:   The pain does not go away.   You have a fever.   You keep throwing up (vomiting).   The pain is felt only in portions of the abdomen. Pain in the right side could possibly be appendicitis. In an adult, pain in the left lower portion of the abdomen could be colitis or diverticulitis.   You pass bloody or black tarry stools.  MAKE SURE YOU:   Understand these instructions.   Will watch your condition.   Will get help right away if you are not doing well or get worse.  Document Released: 06/16/2005 Document Revised: 08/26/2011 Document Reviewed: 04/24/2008 [facility] Patient Information 2012 [place], [place].  Constipation in Adults Constipation is having fewer than 2 bowel movements per week. Usually, the stools are hard. As we grow older, constipation is more common. If you try to fix constipation with laxatives, the problem may get worse. This is because laxatives taken over a long period of time make the colon muscles weaker. A low-fiber diet, not taking in enough fluids,  and taking some medicines may make these problems worse. MEDICATIONS THAT MAY CAUSE CONSTIPATION  Water pills (diuretics).   Calcium channel blockers (used to control blood pressure and for the heart).   Certain pain medicines (narcotics).   Anticholinergics.   Anti-inflammatory agents.   Antacids that contain aluminum.  DISEASES THAT CONTRIBUTE TO CONSTIPATION  Diabetes.   Parkinson's disease.   Dementia.   Stroke.   Depression.   Illnesses that cause problems with salt and water metabolism.  HOME CARE INSTRUCTIONS   Constipation is usually best cared for without medicines. Increasing dietary fiber and eating more fruits and vegetables is the best way to manage constipation.   Slowly increase fiber intake to 25 to 38 grams per day. Whole grains, fruits, vegetables, and legumes are good sources of fiber. A dietitian can further help you incorporate high-fiber foods into your diet.   Drink enough water and fluids to keep your urine clear or pale yellow.   A fiber supplement may be added to your diet if you cannot get enough fiber from foods.   Increasing your activities also helps improve regularity.   Suppositories, as suggested by your caregiver, will also help. If you are using antacids, such as aluminum or calcium containing products, it will be helpful to switch to products containing magnesium if your caregiver says it is okay.   If you have been given a liquid injection (enema) today, this is only a temporary measure. It should not be relied  on for treatment of longstanding (chronic) constipation.   Stronger measures, such as magnesium sulfate, should be avoided if possible. This may cause uncontrollable diarrhea. Using magnesium sulfate may not allow you time to make it to the bathroom.  SEEK IMMEDIATE MEDICAL CARE IF:   There is bright red blood in the stool.   The constipation stays for more than 4 days.   There is belly (abdominal) or rectal pain.   You  do not seem to be getting better.   You have any questions or concerns.  MAKE SURE YOU:   Understand these instructions.   Will watch your condition.   Will get help right away if you are not doing well or get worse.  Document Released: 06/04/2004 Document Revised: 08/26/2011 Document Reviewed: 08/10/2011 [facility] Patient Information 2012 [place], [place].  Mix an entire bottle of miralax (255g) with 64 oz gatorade and drink the entire contents.

## 2011-12-17 NOTE — ED Notes (Signed)
Pt states she had ovarian cyst. Complain of pain and pressure in her pelvic area

## 2011-12-17 NOTE — ED Provider Notes (Signed)
History     CSN: [id number]  Arrival date & time 12/17/11  1645   First MD Initiated Contact with Patient 12/17/11 1705      Chief Complaint  Patient presents with  . Pelvic Pain    (Consider location/radiation/quality/duration/timing/severity/associated sxs/prior treatment) Patient is a 21 y.o. female presenting with abdominal pain. The history is provided by the patient. No language interpreter was used.  Abdominal Pain The primary symptoms of the illness include abdominal pain. The primary symptoms of the illness do not include fever, fatigue, shortness of breath, nausea, vomiting, diarrhea, hematochezia, dysuria, vaginal discharge or vaginal bleeding. The current episode started 2 days ago. The onset of the illness was gradual. The problem has been gradually worsening.  The pain came on gradually. The abdominal pain has been gradually worsening since its onset. The abdominal pain is located in the suprapubic region. The abdominal pain does not radiate.  The patient states that she believes she is currently not pregnant. The patient has not had a change in bowel habit. Symptoms associated with the illness do not include chills, anorexia, constipation, urgency, frequency or back pain.    Past Medical History  Diagnosis Date  . Eczema 2008  . Asthma   . Ovarian cyst     History reviewed. No pertinent past surgical history.  Family History  Problem Relation Age of Onset  . Diabetes Mother     History  Substance Use Topics  . Smoking status: Current Everyday Smoker -- 0.1 packs/day for 5 years    Types: Cigarettes  . Smokeless tobacco: Never Used  . Alcohol Use: Yes    OB History    Grav Para Term Preterm Abortions TAB SAB Ect Mult Living   1 1 0 0 0 0 0 0 0 1       Review of Systems  Constitutional: Negative for fever, chills, activity change, appetite change and fatigue.  HENT: Negative for congestion, sore throat, rhinorrhea, neck pain and neck stiffness.     Respiratory: Negative for cough and shortness of breath.   Cardiovascular: Negative for chest pain and palpitations.  Gastrointestinal: Positive for abdominal pain. Negative for nausea, vomiting, diarrhea, constipation, hematochezia and anorexia.  Genitourinary: Negative for dysuria, urgency, frequency, flank pain, vaginal bleeding and vaginal discharge.  Musculoskeletal: Negative for myalgias, back pain and arthralgias.  Neurological: Negative for dizziness, weakness, light-headedness, numbness and headaches.  All other systems reviewed and are negative.    Allergies  Review of patient's allergies indicates no known allergies.  Home Medications   Current Outpatient Rx  Name Route Sig Dispense Refill  . CLONAZEPAM 0.5 MG PO TABS Oral Take 0.25-1 mg by mouth 2 (two) times daily as needed.    Marland Kitchen FLUOXETINE HCL 10 MG PO CAPS Oral Take 10 mg by mouth daily.    Marland Kitchen LEVONORGESTREL 20 MCG/24HR IU IUD Intrauterine 1 each by Intrauterine route once.    Marland Kitchen NAPROXEN 500 MG PO TABS Oral Take 1 tablet (500 mg total) by mouth 2 (two) times daily. 30 tablet 0  . POLYETHYLENE GLYCOL 3350 PO POWD Oral Take 17 g by mouth daily. 255 g 0  . PREDNISONE 20 MG PO TABS Oral Take 20 mg by mouth daily. Take three tablets daily for 2 days, then two tablets for 2 days, then one tablet for 2 days, then stop      BP 133/79  Pulse 111  Temp(Src) 97.8 F (36.6 C) (Oral)  Resp 20  Ht 5\' 3"  (1.6 m)  Wt 144 lb (65.318 kg)  BMI 25.51 kg/m2  SpO2 98%  Physical Exam  Nursing note and vitals reviewed. Constitutional: She is oriented to person, place, and time. She appears well-developed and well-nourished. No distress.  HENT:  Head: Normocephalic and atraumatic.  Mouth/Throat: Oropharynx is clear and moist.  Eyes: Conjunctivae and EOM are normal. Pupils are equal, round, and reactive to light.  Neck: Normal range of motion. Neck supple.  Cardiovascular: Normal rate, regular rhythm, normal heart sounds and intact  distal pulses.  Exam reveals no gallop and no friction rub.   No murmur heard. Pulmonary/Chest: Effort normal and breath sounds normal. No respiratory distress. She exhibits no tenderness.  Abdominal: Soft. Bowel sounds are normal. There is tenderness (suprapubic).  Genitourinary: Cervix exhibits discharge (physiologic). Cervix exhibits no motion tenderness. Right adnexum displays no mass, no tenderness and no fullness. Left adnexum displays no mass, no tenderness and no fullness.  Musculoskeletal: Normal range of motion. She exhibits no tenderness.  Neurological: She is alert and oriented to person, place, and time. No cranial nerve deficit.  Skin: Skin is warm and dry. No rash noted.    ED Course  Procedures (including critical care time)  Labs Reviewed  WET PREP, GENITAL - Abnormal; Notable for the following:    Clue Cells Wet Prep HPF POC RARE (*)    WBC, Wet Prep HPF POC FEW (*)    All other components within normal limits  URINALYSIS, ROUTINE W REFLEX MICROSCOPIC  POCT PREGNANCY, URINE  GC/CHLAMYDIA PROBE AMP, GENITAL   Dg Abd 1 View  12/17/2011  *RADIOLOGY REPORT*  Clinical Data: Pelvic pain  ABDOMEN - 1 VIEW  Comparison: 09/03/2011  Findings: The bowel gas pattern appears non-obstructed.  There are no dilated loops of small bowel or fluid levels identified.  IUD is identified within the central portion of the pelvis.  No abnormal abdominal or pelvic calcifications.  The visualized bony structures appear normal.  IMPRESSION:  1.  Normal appearance of the bowel gas pattern.  Original Report Authenticated By: Rosealee Albee, M.D.     1. Constipation   2. Abdominal pain       MDM  Rare clue cells therefore feel this is secondary to normal flora. Do not feel this is secondary to bacterial vaginosis. Gonorrhea and Chlamydia cultures were sent. She has constipation evident on her KUB. She'll be treated for constipation. Naprosyn provided for abdominal pain and ovarian cyst for  comfort. She had no evidence of abnormalities on pelvic examination. There is no indication for ultrasound or CT scan at this time. She is no indication for laboratory studies Esther pain is low. I have no concern about appendicitis as she has no right lower quadrant pain at McBurney's point. She is provided abdominal pain return precautions. Instructed to followup        Dayton Bailiff, MD 12/17/11 [phone]

## 2012-04-02 ENCOUNTER — Emergency Department (HOSPITAL_COMMUNITY)
Admission: EM | Admit: 2012-04-02 | Discharge: 2012-04-03 | Disposition: A | Discharge status: Home / Self Care | Payer: Managed Care, Other (non HMO) | Attending: Emergency Medicine | Admitting: Emergency Medicine

## 2012-04-02 ENCOUNTER — Encounter (HOSPITAL_COMMUNITY): Payer: Self-pay | Admitting: Emergency Medicine

## 2012-04-02 ENCOUNTER — Emergency Department (HOSPITAL_COMMUNITY): Payer: Managed Care, Other (non HMO)

## 2012-04-02 DIAGNOSIS — N83209 Unspecified ovarian cyst, unspecified side: Secondary | ICD-10-CM | POA: Insufficient documentation

## 2012-04-02 DIAGNOSIS — S61219A Laceration without foreign body of unspecified finger without damage to nail, initial encounter: Secondary | ICD-10-CM

## 2012-04-02 DIAGNOSIS — W268XXA Contact with other sharp object(s), not elsewhere classified, initial encounter: Secondary | ICD-10-CM | POA: Insufficient documentation

## 2012-04-02 DIAGNOSIS — S61209A Unspecified open wound of unspecified finger without damage to nail, initial encounter: Secondary | ICD-10-CM | POA: Insufficient documentation

## 2012-04-02 DIAGNOSIS — L259 Unspecified contact dermatitis, unspecified cause: Secondary | ICD-10-CM | POA: Insufficient documentation

## 2012-04-02 DIAGNOSIS — F17200 Nicotine dependence, unspecified, uncomplicated: Secondary | ICD-10-CM | POA: Insufficient documentation

## 2012-04-02 DIAGNOSIS — J45909 Unspecified asthma, uncomplicated: Secondary | ICD-10-CM | POA: Insufficient documentation

## 2012-04-02 DIAGNOSIS — Z833 Family history of diabetes mellitus: Secondary | ICD-10-CM | POA: Insufficient documentation

## 2012-04-02 MED ORDER — LIDOCAINE HCL (PF) 1 % IJ SOLN
5.0000 mL | Freq: Once | INTRAMUSCULAR | Status: AC
  Administered 2012-04-03: 5 mL
  Filled 2012-04-02: qty 5

## 2012-04-02 NOTE — ED Notes (Signed)
Patient states that she cut left third metacarpal on glass while washing dishes. States she feels like glass is stuck in finger.

## 2012-04-02 NOTE — ED Notes (Signed)
Pt had a drinking glass slip out of her hand & broke, 1cm lac to the 3rd left finger. States tetanus is up to date.

## 2012-04-02 NOTE — ED Provider Notes (Signed)
History     CSN: [id number]  Arrival date & time 04/02/12  2140   First MD Initiated Contact with Patient 04/02/12 2300      Chief Complaint  Patient presents with  . Laceration    (Consider location/radiation/quality/duration/timing/severity/associated sxs/prior treatment) Patient is a 21 y.o. female presenting with skin laceration. The history is provided by the patient.  Laceration  The incident occurred 1 to 2 hours ago. The laceration is located on the left hand. The laceration is 1 cm in size. The laceration mechanism was a broken glass. The pain is moderate. The pain has been constant since onset. Her tetanus status is UTD.    Past Medical History  Diagnosis Date  . Eczema 2008  . Asthma   . Ovarian cyst     Past Surgical History  Procedure Date  . Ovarian cyst removal     Family History  Problem Relation Age of Onset  . Diabetes Mother     History  Substance Use Topics  . Smoking status: Current Everyday Smoker -- 0.1 packs/day for 5 years    Types: Cigarettes  . Smokeless tobacco: Never Used  . Alcohol Use: Yes    OB History    Grav Para Term Preterm Abortions TAB SAB Ect Mult Living   1 1 0 0 0 0 0 0 0 1       Review of Systems  Constitutional: Negative for activity change.       All ROS Neg except as noted in HPI  HENT: Negative for nosebleeds and neck pain.   Eyes: Negative for photophobia and discharge.  Respiratory: Positive for wheezing. Negative for cough and shortness of breath.   Cardiovascular: Negative for chest pain and palpitations.  Gastrointestinal: Negative for abdominal pain and blood in stool.  Genitourinary: Negative for dysuria, frequency and hematuria.  Musculoskeletal: Negative for back pain and arthralgias.  Skin: Positive for rash.  Neurological: Negative for dizziness, seizures and speech difficulty.  Psychiatric/Behavioral: Negative for hallucinations and confusion.    Allergies  Review of patient's allergies  indicates no known allergies.  Home Medications   Current Outpatient Rx  Name Route Sig Dispense Refill  . CLONAZEPAM 0.5 MG PO TABS Oral Take 0.25-1 mg by mouth 2 (two) times daily.     Marland Kitchen LEVONORGESTREL 20 MCG/24HR IU IUD Intrauterine 1 each by Intrauterine route once.      BP 141/84  Pulse 120  Temp 98.7 F (37.1 C) (Oral)  Resp 20  Ht 5\' 3"  (1.6 m)  Wt 150 lb (68.04 kg)  BMI 26.57 kg/m2  SpO2 99%  Physical Exam  Nursing note and vitals reviewed. Constitutional: She is oriented to person, place, and time. She appears well-developed and well-nourished.  Non-toxic appearance.  HENT:  Head: Normocephalic.  Right Ear: Tympanic membrane and external ear normal.  Left Ear: Tympanic membrane and external ear normal.  Eyes: EOM and lids are normal. Pupils are equal, round, and reactive to light.  Neck: Normal range of motion. Neck supple. Carotid bruit is not present.  Cardiovascular: Normal rate, regular rhythm, normal heart sounds, intact distal pulses and normal pulses.   Pulmonary/Chest: Breath sounds normal. No respiratory distress.  Abdominal: Soft. Bowel sounds are normal. There is no tenderness. There is no guarding.  Musculoskeletal: Normal range of motion.       1.3cm laceration of the mid left 3rd finger. Bleeding controlled. Good cap refill and sensory.  Lymphadenopathy:       Head (right side): No  submandibular adenopathy present.       Head (left side): No submandibular adenopathy present.    She has no cervical adenopathy.  Neurological: She is alert and oriented to person, place, and time. She has normal strength. No cranial nerve deficit or sensory deficit.  Skin: Skin is warm and dry.  Psychiatric: She has a normal mood and affect. Her speech is normal.    ED Course  Procedures : LACERATION REPAIR - patient identified by arm band. Permission for procedure given by the patient. Procedural time out taken before repair of laceration to the left third finger. The  third finger was painted with Betadine. The laceration site was infiltrated with 1% plain lidocaine. It was then irrigated with saline. No foreign body appreciated. No bone or tendon involvement noted. Capsule was not involved. The wound was then repaired with 3 interrupted sutures of 4-0 nylon. The and measured 1 cm. Sterile bandage applied by me. Patient tolerated procedure without problem.   Labs Reviewed - No data to display No results found.   No diagnosis found.    MDM  I have reviewed nursing notes, vital signs, and all appropriate lab and imaging results for this patient. Patient states she was washing dishes when she accidentally cut her left third finger. There no neurovascular deficits appreciated. X-ray of the finger was negative for and the capsule or foreign body. The patient is to have the sutures removed in 7 days. She is to return sooner if any changes consistent with infection.       Kathie Dike, [place] 04/03/12 [phone]

## 2012-04-02 NOTE — ED Notes (Signed)
Approximately 2 cm laceration noted to knuckle of left third metacarpal with mild swelling around knuckle. Bleeding controlled at this time.

## 2012-04-03 NOTE — ED Notes (Signed)
Pt alert & oriented x4, stable gait. Patient given discharge instructions, paperwork & prescription(s). Patient  instructed to stop at the registration desk to finish any additional paperwork. Patient verbalized understanding. Pt left department w/ no further questions. 

## 2012-04-03 NOTE — ED Provider Notes (Signed)
Medical screening examination/treatment/procedure(s) were performed by non-physician practitioner and as supervising physician I was immediately available for consultation/collaboration.  Sunnie Nielsen, MD 04/03/12 [phone]

## 2012-05-23 ENCOUNTER — Encounter (HOSPITAL_COMMUNITY): Payer: Self-pay

## 2012-05-23 ENCOUNTER — Emergency Department (HOSPITAL_COMMUNITY)
Admission: EM | Admit: 2012-05-23 | Discharge: 2012-05-23 | Disposition: A | Discharge status: Home / Self Care | Payer: Managed Care, Other (non HMO) | Attending: Emergency Medicine | Admitting: Emergency Medicine

## 2012-05-23 DIAGNOSIS — N939 Abnormal uterine and vaginal bleeding, unspecified: Secondary | ICD-10-CM | POA: Insufficient documentation

## 2012-05-23 DIAGNOSIS — F17200 Nicotine dependence, unspecified, uncomplicated: Secondary | ICD-10-CM | POA: Insufficient documentation

## 2012-05-23 DIAGNOSIS — J45909 Unspecified asthma, uncomplicated: Secondary | ICD-10-CM | POA: Insufficient documentation

## 2012-05-23 DIAGNOSIS — N926 Irregular menstruation, unspecified: Secondary | ICD-10-CM | POA: Insufficient documentation

## 2012-05-23 LAB — POCT PREGNANCY, URINE: Preg Test, Ur: NEGATIVE

## 2012-05-23 MED ORDER — ONDANSETRON 8 MG PO TBDP: 8.0000 mg | ORAL_TABLET | Freq: Three times a day (TID) | ORAL | Status: AC | PRN

## 2012-05-23 NOTE — ED Notes (Signed)
No answer in waiting room 

## 2012-05-23 NOTE — ED Provider Notes (Signed)
History  This chart was scribed for Karen Gaskins, MD by Bennett Scrape. This patient was seen in room APA09/APA09 and the patient's care was started at 5:55PM.  CSN: [id number]  Arrival date & time 05/23/12  1455   First MD Initiated Contact with Patient 05/23/12 1755      Chief Complaint  Patient presents with  . Menstrual Problem    Patient is a 21 y.o. female presenting with vaginal bleeding. The history is provided by the patient. No language interpreter was used.  Vaginal Bleeding This is a new problem. The current episode started 2 days ago. The problem occurs constantly. The problem has not changed since onset.Pertinent negatives include no abdominal pain. Nothing aggravates the symptoms. Nothing relieves the symptoms. She has tried nothing for the symptoms.    Karen Shields is a 21 y.o. female who presents to the Emergency Department complaining of 3 days of heavy vaginal bleeding with associated lower back pain, chills, nausea, one episode of emesis, and light-headedness. She reports changing her menstrual pads every 2 hours and states that she has been taking iron pills to help with the light-headedness. She states that she had her LNMP 2 weeks ago and became concerned when she started bleeding again. She reports one prior episode of similar bleeding after she had her daughter. She expresses concern over a possible pregnancy. She denies fever, sore throat, visual disturbance, CP, SOB, abdominal pain, diarrhea, urinary symptoms, HA, and rash as associated symptoms.  She reports that she had a mirena implant removed 2 months ago. She has a h/o asthma. She is a current everyday smoker and occasional alcohol user.  OB-GYN is Dr. Emelda Fear with [facility].  Past Medical History  Diagnosis Date  . Eczema 2008  . Asthma   . Ovarian cyst     Past Surgical History  Procedure Date  . Ovarian cyst removal     Family History  Problem Relation Age of Onset  . Diabetes Mother      History  Substance Use Topics  . Smoking status: Current Everyday Smoker -- 0.1 packs/day for 5 years    Types: Cigarettes  . Smokeless tobacco: Never Used  . Alcohol Use: Yes    OB History    Grav Para Term Preterm Abortions TAB SAB Ect Mult Living   1 1 0 0 0 0 0 0 0 1       Review of Systems  Constitutional: Positive for chills. Negative for fever.  Gastrointestinal: Positive for nausea and vomiting. Negative for abdominal pain and diarrhea.  Genitourinary: Positive for vaginal bleeding.  Musculoskeletal: Positive for back pain.  All other systems reviewed and are negative.    Allergies  Review of patient's allergies indicates no known allergies.  Home Medications   Current Outpatient Rx  Name Route Sig Dispense Refill  . CLONAZEPAM 0.5 MG PO TABS Oral Take 0.25-1 mg by mouth 2 (two) times daily.     Marland Kitchen LEVONORGESTREL 20 MCG/24HR IU IUD Intrauterine 1 each by Intrauterine route once.      Triage Vitals: BP 136/81  Pulse 103  Temp 98.3 F (36.8 C) (Oral)  Resp 20  Ht 5\' 3"  (1.6 m)  Wt 150 lb (68.04 kg)  BMI 26.57 kg/m2  SpO2 100%  LMP 05/03/2012  Physical Exam  Nursing note and vitals reviewed.  CONSTITUTIONAL: Well developed/well nourished HEAD AND FACE: Normocephalic/atraumatic EYES: EOMI/PERRL ENMT: Mucous membranes moist NECK: supple no meningeal signs SPINE:entire spine nontender CV: S1/S2 noted, no  murmurs/rubs/gallops noted LUNGS: Lungs are clear to auscultation bilaterally, no apparent distress ABDOMEN: soft, nontender, no rebound or guarding GU:no cva tenderness, os closed, clotted blood noted, no lacerations, chaperone present NEURO: Pt is awake/alert, moves all extremitiesx4 EXTREMITIES: pulses normal, full ROM SKIN: warm, color normal PSYCH: no abnormalities of mood noted  ED Course  Procedures   DIAGNOSTIC STUDIES: Oxygen Saturation is 100% on room air, normal by my interpretation.    COORDINATION OF CARE: 5:39PM-Informed pt  that her pregnancy test was negative. Discussed treatment plan which includes an pelvic exam with pt at bedside and pt agreed to plan. Pt to f/u with gyn as outpatient   Labs Reviewed  POCT PREGNANCY, URINE     MDM  Nursing notes including past medical history and social history reviewed and considered in documentation Labs/vital reviewed and considered   I personally performed the services described in this documentation, which was scribed in my presence. The recorded information has been reviewed and considered.          Karen Gaskins, MD 05/23/12 2035

## 2012-05-23 NOTE — ED Notes (Signed)
Pt says has been off of period for 2 weeks and started having vaginal bleeding again 4 days ago.  Reports bleeding is heavy, has been wearing pad and tampon.  Reports had IUD removed 2 months ago.    Also c/o pain in lower back.

## 2012-06-02 ENCOUNTER — Encounter (HOSPITAL_COMMUNITY): Payer: Self-pay | Admitting: *Deleted

## 2012-06-02 ENCOUNTER — Emergency Department (HOSPITAL_COMMUNITY)
Admission: EM | Admit: 2012-06-02 | Discharge: 2012-06-02 | Disposition: A | Discharge status: Home / Self Care | Payer: Managed Care, Other (non HMO) | Attending: Emergency Medicine | Admitting: Emergency Medicine

## 2012-06-02 DIAGNOSIS — R109 Unspecified abdominal pain: Secondary | ICD-10-CM

## 2012-06-02 DIAGNOSIS — N898 Other specified noninflammatory disorders of vagina: Secondary | ICD-10-CM | POA: Insufficient documentation

## 2012-06-02 DIAGNOSIS — J45909 Unspecified asthma, uncomplicated: Secondary | ICD-10-CM | POA: Insufficient documentation

## 2012-06-02 DIAGNOSIS — R197 Diarrhea, unspecified: Secondary | ICD-10-CM | POA: Insufficient documentation

## 2012-06-02 DIAGNOSIS — F17200 Nicotine dependence, unspecified, uncomplicated: Secondary | ICD-10-CM | POA: Insufficient documentation

## 2012-06-02 DIAGNOSIS — R1031 Right lower quadrant pain: Secondary | ICD-10-CM | POA: Insufficient documentation

## 2012-06-02 DIAGNOSIS — R112 Nausea with vomiting, unspecified: Secondary | ICD-10-CM | POA: Insufficient documentation

## 2012-06-02 LAB — URINALYSIS, MICROSCOPIC ONLY
Bilirubin Urine: NEGATIVE
Leukocytes, UA: NEGATIVE
Nitrite: NEGATIVE
Specific Gravity, Urine: 1.01 (ref 1.005–1.030)
Urobilinogen, UA: 0.2 mg/dL (ref 0.0–1.0)
pH: 6 (ref 5.0–8.0)

## 2012-06-02 LAB — COMPREHENSIVE METABOLIC PANEL
ALT: 11 U/L (ref 0–35)
AST: 14 U/L (ref 0–37)
Alkaline Phosphatase: 88 U/L (ref 39–117)
CO2: 25 mEq/L (ref 19–32)
Chloride: 104 mEq/L (ref 96–112)
Creatinine, Ser: 0.71 mg/dL (ref 0.50–1.10)
GFR calc non Af Amer: >90 mL/min (ref >90)
Potassium: 3.5 mEq/L (ref 3.5–5.1)
Total Bilirubin: 0.5 mg/dL (ref 0.3–1.2)

## 2012-06-02 LAB — CBC
Hemoglobin: 13.8 g/dL (ref 12.0–15.0)
MCH: 31.2 pg (ref 26.0–34.0)
MCHC: 34.4 g/dL (ref 30.0–36.0)
Platelets: 332 10*3/uL (ref 150–400)
RDW: 12.8 % (ref 11.5–15.5)

## 2012-06-02 LAB — WET PREP, GENITAL: Trich, Wet Prep: NONE SEEN

## 2012-06-02 MED ORDER — OXYCODONE-ACETAMINOPHEN 5-325 MG PO TABS
1.0000 | ORAL_TABLET | Freq: Once | ORAL | Status: AC
  Administered 2012-06-02: 1 via ORAL
  Filled 2012-06-02: qty 1

## 2012-06-02 MED ORDER — OXYCODONE-ACETAMINOPHEN 5-325 MG PO TABS: 1.0000 | ORAL_TABLET | Freq: Four times a day (QID) | ORAL | Status: AC | PRN

## 2012-06-02 NOTE — ED Notes (Signed)
RLQ pain, thinks it is ovarian cyst, Low back pain.  N/V/D .  Seen here recently for same sx.

## 2012-06-02 NOTE — ED Notes (Signed)
Pt is currently drinking water before collecting ua. Pt out of gown and fully clothed. Pt made aware that she needs to put gown on in order to have pelvic obtained.

## 2012-06-02 NOTE — ED Provider Notes (Signed)
History   This chart was scribed for Tobin Chad, MD by Toya Smothers. The patient was seen in room APA14/APA14. Patient's care was started at 1347.  CSN: [id number]  Arrival date & time 06/02/12  1347   First MD Initiated Contact with Patient 06/02/12 1420      Chief Complaint  Patient presents with  . Abdominal Pain   Patient is a 21 y.o. female presenting with abdominal pain. The history is provided by the patient. No language interpreter was used.  Abdominal Pain The primary symptoms of the illness include abdominal pain, nausea, vomiting and diarrhea. The primary symptoms of the illness do not include fever, fatigue, shortness of breath, dysuria, vaginal discharge or vaginal bleeding. The current episode started more than 2 days ago (2 weeks). The onset of the illness was gradual. The problem has been gradually worsening.  Nausea began 3 to 5 days ago.  The vomiting began more than 2 days ago. Vomiting occurs 2 to 5 times per day. The emesis contains stomach contents.  The patient states that she believes she is currently not pregnant. Symptoms associated with the illness do not include chills, urgency, hematuria, frequency or back pain. Significant associated medical issues do not include sickle cell disease.   TAIMI TOWE is a 21 y.o. female with a h/o ovarian cysts who presents to the Emergency Department complaining of 2 weeks of gradual onset moderate constant worsening RLQ abdominal pain. Pain is described similar to previous ovarian cysts. Pt endorses associated constant nausea and mild emesis twice in the past 2 days. Pt has recently discontinued the use of Mirana. She was last seen at [facility] for a heavy flow 2 weeks ago. Pt denies vaginal discharge, dysuria, urgency, and frequency.  Past Medical History  Diagnosis Date  . Eczema 2008  . Asthma   . Ovarian cyst     Past Surgical History  Procedure Date  . Ovarian cyst removal     Family History    Problem Relation Age of Onset  . Diabetes Mother     History  Substance Use Topics  . Smoking status: Current Every Day Smoker -- 0.1 packs/day for 5 years    Types: Cigarettes  . Smokeless tobacco: Never Used  . Alcohol Use: Yes    OB History    Grav Para Term Preterm Abortions TAB SAB Ect Mult Living   1 1 0 0 0 0 0 0 0 1       Review of Systems  Constitutional: Negative for fever, chills, activity change, appetite change and fatigue.  HENT: Negative for rhinorrhea and neck pain.   Eyes: Negative for pain.  Respiratory: Negative for cough and shortness of breath.   Cardiovascular: Negative for chest pain.  Gastrointestinal: Positive for nausea, vomiting, abdominal pain and diarrhea.  Genitourinary: Negative for dysuria, urgency, frequency, hematuria, vaginal bleeding, vaginal discharge and enuresis.  Musculoskeletal: Negative for back pain.  Skin: Negative for rash and wound.  Neurological: Negative for weakness and headaches.  Hematological: Negative.   Psychiatric/Behavioral: Negative.     Allergies  Review of patient's allergies indicates no known allergies.  Home Medications   Current Outpatient Rx  Name Route Sig Dispense Refill  . ALPRAZOLAM 1 MG PO TABS Oral Take 0.5 mg by mouth 2 (two) times daily.      BP 126/74  Pulse 86  Temp 98.2 F (36.8 C) (Oral)  Resp 18  Ht 5\' 3"  (1.6 m)  Wt 147 lb (66.679  kg)  BMI 26.04 kg/m2  SpO2 99%  LMP 05/03/2012  Physical Exam  Nursing note and vitals reviewed. Constitutional: She is oriented to person, place, and time. She appears well-developed and well-nourished. No distress.  HENT:  Head: Normocephalic and atraumatic.  Right Ear: External ear normal.  Left Ear: External ear normal.  Nose: Nose normal.  Mouth/Throat: Oropharynx is clear and moist. No oropharyngeal exudate.  Eyes: Conjunctivae normal and EOM are normal. Pupils are equal, round, and reactive to light. Right eye exhibits no discharge. Left eye  exhibits no discharge. No scleral icterus.  Neck: Normal range of motion. Neck supple. No JVD present. No tracheal deviation present. No thyromegaly present.  Cardiovascular: Normal rate, regular rhythm, normal heart sounds and intact distal pulses.  Exam reveals no gallop and no friction rub.   No murmur heard. Pulmonary/Chest: Effort normal and breath sounds normal. No stridor. No respiratory distress. She has no rales. She exhibits no tenderness.  Abdominal: Soft. Bowel sounds are normal. She exhibits no distension and no mass. There is tenderness. There is no rebound and no guarding. Hernia confirmed negative in the right inguinal area and confirmed negative in the left inguinal area.       Very vague lowe abdominal tenderness  Genitourinary: Uterus normal. Rectal exam shows no external hemorrhoid. Pelvic exam was performed with patient supine. No labial fusion. There is no rash, tenderness, lesion or injury on the right labia. There is no rash, tenderness, lesion or injury on the left labia. Uterus is not deviated, not enlarged, not fixed and not tender. Cervix exhibits no motion tenderness, no discharge and no friability. Right adnexum displays no mass, no tenderness and no fullness. Left adnexum displays no mass, no tenderness and no fullness. No erythema, tenderness or bleeding around the vagina. No foreign body around the vagina. No signs of injury around the vagina. Vaginal discharge found.       Scant discharge.  No adnexal fullness.  Very mild discomfort on palpation of rt adnexa.  Musculoskeletal: Normal range of motion. She exhibits no edema and no tenderness.  Lymphadenopathy:    She has no cervical adenopathy.       Right: No inguinal adenopathy present.       Left: No inguinal adenopathy present.  Neurological: She is alert and oriented to person, place, and time. No cranial nerve deficit.  Skin: Skin is warm and dry. No rash noted. She is not diaphoretic. No erythema. No pallor.    Psychiatric: She has a normal mood and affect. Her behavior is normal.    ED Course  Procedures (including critical care time) DIAGNOSTIC STUDIES: Oxygen Saturation is 99% on room air, normal by my interpretation.    COORDINATION OF CARE: 1720- Evaluated Pt. Pt is awake, alert, and oriented.   Labs Reviewed  URINALYSIS, WITH MICROSCOPIC - Abnormal; Notable for the following:    Squamous Epithelial / LPF FEW (*)     All other components within normal limits  COMPREHENSIVE METABOLIC PANEL - Abnormal; Notable for the following:    BUN 5 (*)     All other components within normal limits  PREGNANCY, URINE  CBC  GC/CHLAMYDIA PROBE AMP, GENITAL  WET PREP, GENITAL   No results found.   No diagnosis found.    MDM  Pt presents for evaluation of lower abdominal pain.  She has been seen in the ER within the last week for evaluation of vaginal bleeding which has since resolved.  Very nonspecific findings noted  on pelvic exam.  Exam is not consistent with a surgical abdomen.  Plan review lab results, U/A, and wet prep.  If normal, plan symptomatic care and close outpt f/u.     I personally performed the services described in this documentation, which was scribed in my presence. The recorded information has been reviewed and considered.      Tobin Chad, MD 06/02/12 [phone]

## 2012-08-18 ENCOUNTER — Encounter (HOSPITAL_COMMUNITY): Payer: Self-pay

## 2012-08-18 ENCOUNTER — Emergency Department (HOSPITAL_COMMUNITY)
Admission: EM | Admit: 2012-08-18 | Discharge: 2012-08-18 | Disposition: A | Discharge status: Home / Self Care | Payer: No Typology Code available for payment source | Attending: Emergency Medicine | Admitting: Emergency Medicine

## 2012-08-18 DIAGNOSIS — Y93I9 Activity, other involving external motion: Secondary | ICD-10-CM | POA: Insufficient documentation

## 2012-08-18 DIAGNOSIS — K219 Gastro-esophageal reflux disease without esophagitis: Secondary | ICD-10-CM | POA: Insufficient documentation

## 2012-08-18 DIAGNOSIS — F17200 Nicotine dependence, unspecified, uncomplicated: Secondary | ICD-10-CM | POA: Insufficient documentation

## 2012-08-18 DIAGNOSIS — M545 Low back pain, unspecified: Secondary | ICD-10-CM | POA: Insufficient documentation

## 2012-08-18 DIAGNOSIS — T148XXA Other injury of unspecified body region, initial encounter: Secondary | ICD-10-CM

## 2012-08-18 DIAGNOSIS — Z331 Pregnant state, incidental: Secondary | ICD-10-CM

## 2012-08-18 DIAGNOSIS — Y92410 Unspecified street and highway as the place of occurrence of the external cause: Secondary | ICD-10-CM | POA: Insufficient documentation

## 2012-08-18 DIAGNOSIS — F411 Generalized anxiety disorder: Secondary | ICD-10-CM | POA: Insufficient documentation

## 2012-08-18 HISTORY — DX: Anxiety disorder, unspecified: F41.9

## 2012-08-18 HISTORY — DX: Gastro-esophageal reflux disease without esophagitis: K21.9

## 2012-08-18 HISTORY — DX: Unspecified asthma, uncomplicated: J45.909

## 2012-08-18 LAB — URINALYSIS, ROUTINE W REFLEX MICROSCOPIC
Glucose, UA: NEGATIVE mg/dL
Hgb urine dipstick: NEGATIVE
Specific Gravity, Urine: >1.03 — ABNORMAL HIGH (ref 1.005–1.030)
pH: 6 (ref 5.0–8.0)

## 2012-08-18 MED ORDER — ACETAMINOPHEN 325 MG PO TABS
ORAL_TABLET | ORAL | Status: AC
  Filled 2012-08-18: qty 2

## 2012-08-18 MED ORDER — ACETAMINOPHEN 325 MG PO TABS
650.0000 mg | ORAL_TABLET | Freq: Once | ORAL | Status: AC
  Administered 2012-08-18: 650 mg via ORAL

## 2012-08-18 NOTE — ED Notes (Signed)
Pt alert & oriented x4, stable gait. Patient given discharge instructions, paperwork & prescription(s). Patient  instructed to stop at the registration desk to finish any additional paperwork. Patient verbalized understanding. Pt left department w/ no further questions. 
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
## 2012-08-21 ENCOUNTER — Encounter (HOSPITAL_COMMUNITY): Payer: Self-pay | Admitting: *Deleted

## 2012-08-21 LAB — URINE CULTURE: Colony Count: 95000

## 2012-09-20 NOTE — L&D Delivery Note (Signed)
Delivery Summary for Karen Shields  Labor Events:   Preterm labor:   Rupture date:   Rupture time:   Rupture type:   Fluid Color: Clear  Induction:   Augmentation:   Complications:   Cervical ripening:          Delivery:   Episiotomy:   Lacerations:   Repair suture:   Repair # of packets:   Blood loss (ml): 450   Information for the patient's newborn:  Camora, Tremain Girl Ameliana [[id number]]    Delivery 04/12/2013 3:54 PM by  Vaginal, Spontaneous Delivery Sex:  female Gestational Age: <None> Delivery Clinician:  Jolyn Lent Living?: Yes        APGARS  One minute Five minutes Ten minutes  Skin color: 1   1      Heart rate: 2   2      Grimace: 2   2      Muscle tone: 2   2      Breathing: 2   2      Totals: 9  9      Presentation/position: Vertex  Left Occiput Anterior Resuscitation:   Cord information: 3 vessels   Disposition of cord blood: No    Blood gases sent? No Complications: None  Placenta: Delivered: 04/12/2013 4:03 PM  Spontaneous  Intact appearance Newborn Measurements: Weight:   Height:   Head circumference:   Chest circumference:   Other providers: Delivery Nurse Delivery Assist Vevalyn S Hatch Hedda Slade  Additional  information: Forceps:   Vacuum:   Breech:   Observed anomalies        SVD of vigorous female infant over intact peritoneum. Active management of third stage of labor with pit and traction. EBL 450cc. Hemostatic after delivery of placenta. Appears intact 3v cord .Dorothea Ogle present for delviery. No repair required.   Tawana Scale, MD OB Fellow

## 2013-04-04 ENCOUNTER — Other Ambulatory Visit: Payer: Self-pay | Admitting: Family Medicine

## 2013-04-04 NOTE — Telephone Encounter (Signed)
Need chart tom

## 2013-04-04 NOTE — Telephone Encounter (Signed)
Not seen here this year- Patient is 30 + weeks pregnant

## 2013-04-05 NOTE — Telephone Encounter (Signed)
i thought i did this and the other two last eve--just in case my refills are somewhat different ok plus two ref

## 2013-04-06 ENCOUNTER — Telehealth: Payer: Self-pay | Admitting: Family Medicine

## 2013-04-06 NOTE — Telephone Encounter (Signed)
RX called in for xanax to [facility]. Patient notified. Will call back on Monday to schedule OV.

## 2013-04-06 NOTE — Telephone Encounter (Signed)
Patient needs Rx for xanax to [facility]

## 2013-04-06 NOTE — Telephone Encounter (Signed)
Ok times one. No further rx until seen. rec f u ov with carolyn.

## 2013-04-06 NOTE — Telephone Encounter (Signed)
Last office visit (sick) 07/12/13

## 2013-04-12 ENCOUNTER — Encounter (HOSPITAL_COMMUNITY): Payer: Self-pay | Admitting: Anesthesiology

## 2013-04-12 ENCOUNTER — Inpatient Hospital Stay (HOSPITAL_COMMUNITY)
Admission: AD | Admit: 2013-04-12 | Discharge: 2013-04-14 | DRG: 775 | Disposition: A | Discharge status: Home / Self Care | Payer: Medicaid Other | Source: Ambulatory Visit | Attending: Obstetrics & Gynecology | Admitting: Obstetrics & Gynecology

## 2013-04-12 ENCOUNTER — Encounter (HOSPITAL_COMMUNITY): Payer: Self-pay

## 2013-04-12 ENCOUNTER — Inpatient Hospital Stay (HOSPITAL_COMMUNITY): Payer: Medicaid Other | Admitting: Anesthesiology

## 2013-04-12 DIAGNOSIS — IMO0002 Reserved for concepts with insufficient information to code with codable children: Secondary | ICD-10-CM | POA: Clinically undetermined

## 2013-04-12 DIAGNOSIS — Z8619 Personal history of other infectious and parasitic diseases: Secondary | ICD-10-CM | POA: Diagnosis not present

## 2013-04-12 DIAGNOSIS — O99320 Drug use complicating pregnancy, unspecified trimester: Secondary | ICD-10-CM | POA: Clinically undetermined

## 2013-04-12 DIAGNOSIS — IMO0001 Reserved for inherently not codable concepts without codable children: Secondary | ICD-10-CM

## 2013-04-12 LAB — COMPREHENSIVE METABOLIC PANEL
AST: 24 U/L (ref 0–37)
Albumin: 3 g/dL — ABNORMAL LOW (ref 3.5–5.2)
Alkaline Phosphatase: 178 U/L — ABNORMAL HIGH (ref 39–117)
BUN: 3 mg/dL — ABNORMAL LOW (ref 6–23)
Chloride: 98 mEq/L (ref 96–112)
Potassium: 3.6 mEq/L (ref 3.5–5.1)
Total Bilirubin: 0.4 mg/dL (ref 0.3–1.2)
Total Protein: 6.1 g/dL (ref 6.0–8.3)

## 2013-04-12 LAB — OB RESULTS CONSOLE RPR: RPR: NONREACTIVE

## 2013-04-12 LAB — RAPID URINE DRUG SCREEN, HOSP PERFORMED
Cocaine: NOT DETECTED
Opiates: NOT DETECTED

## 2013-04-12 LAB — OB RESULTS CONSOLE GBS: GBS: NEGATIVE

## 2013-04-12 LAB — RPR: RPR Ser Ql: NONREACTIVE

## 2013-04-12 LAB — OB RESULTS CONSOLE RUBELLA ANTIBODY, IGM: Rubella: IMMUNE

## 2013-04-12 LAB — CBC
MCV: 86.8 fL (ref 78.0–100.0)
Platelets: 233 10*3/uL (ref 150–400)
RBC: 3.93 MIL/uL (ref 3.87–5.11)
RDW: 13.4 % (ref 11.5–15.5)
WBC: 17.5 10*3/uL — ABNORMAL HIGH (ref 4.0–10.5)

## 2013-04-12 LAB — PROTEIN / CREATININE RATIO, URINE
Creatinine, Urine: 97.93 mg/dL
Total Protein, Urine: 11.8 mg/dL

## 2013-04-12 LAB — GROUP B STREP BY PCR: Group B strep by PCR: NEGATIVE

## 2013-04-12 LAB — OB RESULTS CONSOLE GC/CHLAMYDIA: Gonorrhea: NEGATIVE

## 2013-04-12 LAB — OB RESULTS CONSOLE ABO/RH: RH Type: POSITIVE

## 2013-04-12 MED ORDER — LIDOCAINE HCL (PF) 1 % IJ SOLN
30.0000 mL | INTRAMUSCULAR | Status: DC | PRN
  Filled 2013-04-12: qty 30

## 2013-04-12 MED ORDER — OXYCODONE-ACETAMINOPHEN 5-325 MG PO TABS: 1.0000 | ORAL_TABLET | ORAL | Status: DC | PRN

## 2013-04-12 MED ORDER — DIBUCAINE 1 % RE OINT: 1.0000 "application " | TOPICAL_OINTMENT | RECTAL | Status: DC | PRN

## 2013-04-12 MED ORDER — FENTANYL CITRATE 0.05 MG/ML IJ SOLN
INTRAMUSCULAR | Status: AC
  Administered 2013-04-12: 100 ug
  Filled 2013-04-12: qty 2

## 2013-04-12 MED ORDER — CITRIC ACID-SODIUM CITRATE 334-500 MG/5ML PO SOLN: 30.0000 mL | ORAL | Status: DC | PRN

## 2013-04-12 MED ORDER — PHENYLEPHRINE 40 MCG/ML (10ML) SYRINGE FOR IV PUSH (FOR BLOOD PRESSURE SUPPORT)
80.0000 ug | PREFILLED_SYRINGE | INTRAVENOUS | Status: DC | PRN
  Filled 2013-04-12: qty 2

## 2013-04-12 MED ORDER — DIPHENHYDRAMINE HCL 25 MG PO CAPS: 25.0000 mg | ORAL_CAPSULE | Freq: Four times a day (QID) | ORAL | Status: DC | PRN

## 2013-04-12 MED ORDER — ONDANSETRON HCL 4 MG/2ML IJ SOLN: 4.0000 mg | Freq: Four times a day (QID) | INTRAMUSCULAR | Status: DC | PRN

## 2013-04-12 MED ORDER — FENTANYL CITRATE 0.05 MG/ML IJ SOLN: 100.0000 ug | INTRAMUSCULAR | Status: DC | PRN

## 2013-04-12 MED ORDER — DIPHENHYDRAMINE HCL 50 MG/ML IJ SOLN: 12.5000 mg | INTRAMUSCULAR | Status: DC | PRN

## 2013-04-12 MED ORDER — FENTANYL 2.5 MCG/ML BUPIVACAINE 1/10 % EPIDURAL INFUSION (WH - ANES)
14.0000 mL/h | INTRAMUSCULAR | Status: DC | PRN
  Filled 2013-04-12: qty 125

## 2013-04-12 MED ORDER — LACTATED RINGERS IV SOLN
500.0000 mL | INTRAVENOUS | Status: DC | PRN
Start: 2013-04-12 — End: 2013-04-12

## 2013-04-12 MED ORDER — OXYCODONE-ACETAMINOPHEN 5-325 MG PO TABS
1.0000 | ORAL_TABLET | ORAL | Status: DC | PRN
  Administered 2013-04-12 – 2013-04-14 (×4): 1 via ORAL
  Filled 2013-04-12 (×4): qty 1

## 2013-04-12 MED ORDER — SENNOSIDES-DOCUSATE SODIUM 8.6-50 MG PO TABS
2.0000 | ORAL_TABLET | Freq: Every day | ORAL | Status: DC
  Administered 2013-04-12: 2 via ORAL

## 2013-04-12 MED ORDER — EPHEDRINE 5 MG/ML INJ
10.0000 mg | INTRAVENOUS | Status: DC | PRN
  Filled 2013-04-12: qty 2

## 2013-04-12 MED ORDER — ACETAMINOPHEN 325 MG PO TABS: 650.0000 mg | ORAL_TABLET | ORAL | Status: DC | PRN

## 2013-04-12 MED ORDER — SIMETHICONE 80 MG PO CHEW: 80.0000 mg | CHEWABLE_TABLET | ORAL | Status: DC | PRN

## 2013-04-12 MED ORDER — METHYLERGONOVINE MALEATE 0.2 MG/ML IJ SOLN
INTRAMUSCULAR | Status: AC
  Filled 2013-04-12: qty 1

## 2013-04-12 MED ORDER — ZOLPIDEM TARTRATE 5 MG PO TABS: 5.0000 mg | ORAL_TABLET | Freq: Every evening | ORAL | Status: DC | PRN

## 2013-04-12 MED ORDER — PHENYLEPHRINE 40 MCG/ML (10ML) SYRINGE FOR IV PUSH (FOR BLOOD PRESSURE SUPPORT)
80.0000 ug | PREFILLED_SYRINGE | INTRAVENOUS | Status: DC | PRN
  Filled 2013-04-12: qty 2
  Filled 2013-04-12: qty 5

## 2013-04-12 MED ORDER — BENZOCAINE-MENTHOL 20-0.5 % EX AERO
1.0000 "application " | INHALATION_SPRAY | CUTANEOUS | Status: DC | PRN
  Administered 2013-04-13: 1 via TOPICAL
  Filled 2013-04-12: qty 56

## 2013-04-12 MED ORDER — LANOLIN HYDROUS EX OINT: TOPICAL_OINTMENT | CUTANEOUS | Status: DC | PRN

## 2013-04-12 MED ORDER — LACTATED RINGERS IV SOLN
500.0000 mL | Freq: Once | INTRAVENOUS | Status: AC
  Administered 2013-04-12: 500 mL via INTRAVENOUS

## 2013-04-12 MED ORDER — LACTATED RINGERS IV SOLN
INTRAVENOUS | Status: DC
  Administered 2013-04-12: 125 mL/h via INTRAVENOUS

## 2013-04-12 MED ORDER — OXYTOCIN 40 UNITS IN LACTATED RINGERS INFUSION - SIMPLE MED
62.5000 mL/h | INTRAVENOUS | Status: DC
  Filled 2013-04-12: qty 1000

## 2013-04-12 MED ORDER — LIDOCAINE HCL (PF) 1 % IJ SOLN
INTRAMUSCULAR | Status: DC | PRN
  Administered 2013-04-12 (×2): 4 mL

## 2013-04-12 MED ORDER — PRENATAL MULTIVITAMIN CH
1.0000 | ORAL_TABLET | Freq: Every day | ORAL | Status: DC
  Administered 2013-04-13 – 2013-04-14 (×2): 1 via ORAL
  Filled 2013-04-12 (×2): qty 1

## 2013-04-12 MED ORDER — EPHEDRINE 5 MG/ML INJ
10.0000 mg | INTRAVENOUS | Status: DC | PRN
  Filled 2013-04-12: qty 4
  Filled 2013-04-12: qty 2

## 2013-04-12 MED ORDER — ONDANSETRON HCL 4 MG PO TABS
4.0000 mg | ORAL_TABLET | ORAL | Status: DC | PRN
  Administered 2013-04-14: 4 mg via ORAL
  Filled 2013-04-12: qty 1

## 2013-04-12 MED ORDER — WITCH HAZEL-GLYCERIN EX PADS: 1.0000 "application " | MEDICATED_PAD | CUTANEOUS | Status: DC | PRN

## 2013-04-12 MED ORDER — OXYTOCIN BOLUS FROM INFUSION: 500.0000 mL | INTRAVENOUS | Status: DC

## 2013-04-12 MED ORDER — FENTANYL CITRATE 0.05 MG/ML IJ SOLN
100.0000 ug | Freq: Once | INTRAMUSCULAR | Status: AC
  Administered 2013-04-12: 100 ug via INTRAVENOUS
  Filled 2013-04-12: qty 2

## 2013-04-12 MED ORDER — IBUPROFEN 600 MG PO TABS
600.0000 mg | ORAL_TABLET | Freq: Four times a day (QID) | ORAL | Status: DC
  Administered 2013-04-13 – 2013-04-14 (×8): 600 mg via ORAL
  Filled 2013-04-12 (×7): qty 1

## 2013-04-12 MED ORDER — IBUPROFEN 600 MG PO TABS
600.0000 mg | ORAL_TABLET | Freq: Four times a day (QID) | ORAL | Status: DC | PRN
  Administered 2013-04-12: 600 mg via ORAL
  Filled 2013-04-12: qty 1

## 2013-04-12 MED ORDER — ONDANSETRON HCL 4 MG/2ML IJ SOLN: 4.0000 mg | INTRAMUSCULAR | Status: DC | PRN

## 2013-04-12 MED ORDER — FENTANYL 2.5 MCG/ML BUPIVACAINE 1/10 % EPIDURAL INFUSION (WH - ANES)
INTRAMUSCULAR | Status: DC | PRN
  Administered 2013-04-12: 14 mL/h via EPIDURAL

## 2013-04-12 MED ORDER — TETANUS-DIPHTH-ACELL PERTUSSIS 5-2.5-18.5 LF-MCG/0.5 IM SUSP
0.5000 mL | Freq: Once | INTRAMUSCULAR | Status: AC
  Administered 2013-04-13: 0.5 mL via INTRAMUSCULAR

## 2013-04-12 MED ORDER — MISOPROSTOL 200 MCG PO TABS
ORAL_TABLET | ORAL | Status: AC
  Filled 2013-04-12: qty 4

## 2013-04-12 NOTE — Progress Notes (Signed)
Monitoring Bp q 5 minutes.  Pt. Remains in sitting position on BS.  S.O. In front of her at [facility].

## 2013-04-12 NOTE — Anesthesia Preprocedure Evaluation (Signed)
Anesthesia Evaluation  Patient identified by MRN, date of birth, ID band Patient awake    Reviewed: Allergy & Precautions, H&P , Patient's Chart, lab work & pertinent test results  Airway Mallampati: III TM Distance: >3 FB Neck ROM: full    Dental no notable dental hx. (+) Teeth Intact   Pulmonary neg pulmonary ROS, asthma , Current Smoker,  breath sounds clear to auscultation  Pulmonary exam normal       Cardiovascular negative cardio ROS  Rhythm:regular Rate:Normal     Neuro/Psych negative neurological ROS  negative psych ROS   GI/Hepatic Neg liver ROS, GERD-  Medicated and Controlled,  Endo/Other  negative endocrine ROS  Renal/GU negative Renal ROS  negative genitourinary   Musculoskeletal   Abdominal   Peds  Hematology negative hematology ROS (+)   Anesthesia Other Findings   Reproductive/Obstetrics (+) Pregnancy Ovarian Cyst                           Anesthesia Physical Anesthesia Plan  ASA: II  Anesthesia Plan: Epidural   Post-op Pain Management:    Induction:   Airway Management Planned:   Additional Equipment:   Intra-op Plan:   Post-operative Plan:   Informed Consent: I have reviewed the patients History and Physical, chart, labs and discussed the procedure including the risks, benefits and alternatives for the proposed anesthesia with the patient or authorized representative who has indicated his/her understanding and acceptance.     Plan Discussed with: Anesthesiologist  Anesthesia Plan Comments:         Anesthesia Quick Evaluation

## 2013-04-12 NOTE — Progress Notes (Signed)
Dr. Malen Gauze explained the effects of epidural therapy.  Pt. Instructed not to try to walk after epidural insertion.  Pt. Verbalized understanding of instructions and agreed to comply.

## 2013-04-12 NOTE — Progress Notes (Signed)
Pt. With small vaginal skin tears without repair.  Pitocin infusing at 999cc/hr for 500 cc on alaris pump.

## 2013-04-12 NOTE — H&P (Signed)
Karen Shields is a 22 y.o. female G2P0001 @ [redacted]w[redacted]d presenting for labor evaluation.  She started contracting regularly this morning, with contractions getting stronger over time.  She received her prenatal care in [place] but came to [facility] because her husband wants her to deliver here.  Her records were requested upon arrival. She reports good fetal movement, denies LOF, vaginal bleeding, vaginal itching/burning, urinary symptoms, h/a, dizziness, n/v, or fever/chills.    Per the pt, her hx is significant for an uncomplicated NSVD of her first child, anxiety which she takes Xanax for 3x/week, and mild asthma but she has not used an inhaler in months. She reports her GBS screening was negative a few weeks ago in her prenatal office.  She also reports a normal 1 hour GTT, denies HTN during the pregnancy, and reports her anatomy U/S was normal except they were not able to view the spine very well.  She was told she was low risk for Down's Syndrome after labs were drawn in early pregnancy.   Maternal Medical History:  Reason for admission: Contractions.     OB History   Grav Para Term Preterm Abortions TAB SAB Ect Mult Living   1 1 0 0 0 0 0 0 0 1      Past Medical History  Diagnosis Date  . Eczema 2008  . Asthma   . Ovarian cyst   . Asthma   . Anxiety   . GERD (gastroesophageal reflux disease)    Past Surgical History  Procedure Laterality Date  . Ovarian cyst removal    . Ovarian cyst removal  2011   Family History: family history includes Diabetes in her mother. Social History:  reports that she has been smoking Cigarettes.  She has a 2.5 pack-year smoking history. She does not have any smokeless tobacco history on file. She reports that she does not drink alcohol or use illicit drugs.   Prenatal Transfer Tool  Maternal Diabetes: No Genetic Screening: Normal Maternal Ultrasounds/Referrals: Normal Fetal Ultrasounds or other Referrals:  None Maternal Substance Abuse:   No Significant Maternal Medications:  None Significant Maternal Lab Results:  Lab values include: Group B Strep negative Other Comments:  History provided by pt, prenatal records requested upon admission  Review of Systems  Constitutional: Negative for fever, chills and malaise/fatigue.  Eyes: Negative for blurred vision.  Respiratory: Negative for cough and shortness of breath.   Cardiovascular: Negative for chest pain.  Gastrointestinal: Negative for heartburn and vomiting.  Genitourinary: Negative for dysuria, urgency and frequency.  Musculoskeletal: Negative.   Neurological: Negative for dizziness and headaches.  Psychiatric/Behavioral: Negative for depression.    Dilation: 4.5 Effacement (%): 80 Station: -1 Exam by:: Peace, rn Blood pressure 143/77, pulse 115, temperature 97 F (36.1 C), temperature source Oral, resp. rate 18, height 5' 3.5" (1.613 m), weight 73.846 kg (162 lb 12.8 oz), last menstrual period 07/03/2012, SpO2 99.00%. Maternal Exam:  Uterine Assessment: Contraction strength is moderate.  Contraction duration is 1 minute. Contraction frequency is regular.   Abdomen: Patient reports no abdominal tenderness. Fundal height is 39 cm.   Estimated fetal weight is ~7lbs by Leopolds.    Cervix: Cervix evaluated by digital exam.     Fetal Exam Fetal Monitor Review: Mode: ultrasound.   Baseline rate: 135.  Variability: moderate (6-25 bpm).   Pattern: accelerations present and no decelerations.    Fetal State Assessment: Category I - tracings are normal.     Physical Exam  Nursing note and vitals  reviewed. Constitutional: She is oriented to person, place, and time. She appears well-developed and well-nourished.  Neck: Normal range of motion.  Cardiovascular: Normal rate and regular rhythm.   Respiratory: Effort normal and breath sounds normal.  GI: Soft.  Musculoskeletal: Normal range of motion.  Neurological: She is alert and oriented to person, place, and  time. She has normal reflexes.  Skin: Skin is warm and dry.  Psychiatric: She has a normal mood and affect. Her behavior is normal. Judgment and thought content normal.      Prenatal labs (Records requested from [facility] practice): ABO, Rh:   Antibody:   Rubella:   RPR:    HBsAg:    HIV:    GBS:   Negative per pt  Assessment/Plan: Active labor at term GBS negative Elevated BP  Admit to [employer] Preeclampsia labs ordered IV pain medication per pt preference May have epidural when desired  Expectant management Anticipate NSVD   LEFTWICH-KIRBY, LISA 04/12/2013, 10:39 AM

## 2013-04-12 NOTE — Progress Notes (Signed)
Dr. Ike Bene here for delivery.

## 2013-04-12 NOTE — H&P (Signed)
Attestation of Attending Supervision of Advanced Practitioner (PA/CNM/NP): Evaluation and management procedures were performed by the Advanced Practitioner under my supervision and collaboration.  I have reviewed the Advanced Practitioner's note and chart, and I agree with the management and plan.  Euretha Najarro, MD, FACOG Attending Obstetrician & Gynecologist Faculty Practice, [facility]  

## 2013-04-12 NOTE — MAU Note (Signed)
Patient states she gets her prenatal care in [place]. States she is having contractions every 3-5 minutes. Denies bleeding or leaking and reports good fetal movement.

## 2013-04-12 NOTE — Anesthesia Procedure Notes (Signed)
Epidural Patient location during procedure: OB Start time: 04/12/2013 2:25 PM  Staffing Anesthesiologist: Teigan Sahli A. Performed by: anesthesiologist   Preanesthetic Checklist Completed: patient identified, site marked, surgical consent, pre-op evaluation, timeout performed, IV checked, risks and benefits discussed and monitors and equipment checked  Epidural Patient position: sitting Prep: site prepped and draped and DuraPrep Patient monitoring: continuous pulse ox and blood pressure Approach: midline Injection technique: LOR air  Needle:  Needle type: Tuohy  Needle gauge: 17 G Needle length: 9 cm and 9 Needle insertion depth: 5 cm cm Catheter type: closed end flexible Catheter size: 19 Gauge Catheter at skin depth: 10 cm Test dose: negative and Other  Assessment Events: blood not aspirated, injection not painful, no injection resistance, negative IV test and no paresthesia  Additional Notes Patient identified. Risks and benefits discussed including failed block, incomplete  Pain control, post dural puncture headache, nerve damage, paralysis, blood pressure Changes, nausea, vomiting, reactions to medications-both toxic and allergic and post Partum back pain. All questions were answered. Patient expressed understanding and wished to proceed. Sterile technique was used throughout procedure. Epidural site was Dressed with sterile barrier dressing. No paresthesias, signs of intravascular injection Or signs of intrathecal spread were encountered.  Patient was more comfortable after the epidural was dosed. Please see RN's note for documentation of vital signs and FHR which are stable.

## 2013-04-12 NOTE — Progress Notes (Signed)
Cervix exam performed by Dr. Ike Bene.  Cervix 9 with a lip and bulging bag.  Setting up the delivery table at present.  Anesthesia called b/c pt. Desires an epidural.

## 2013-04-12 NOTE — Progress Notes (Signed)
Dr Ike Bene notified of patient

## 2013-04-12 NOTE — Progress Notes (Signed)
Will come to see patient as soon as possible

## 2013-04-12 NOTE — Progress Notes (Addendum)
Transferred pt. To [facility] room 146 via w/c.  Accompanied by family and myself.  Assisted into bed.  Pt. Reports that her legs still feel weak when trying to stand up.  Reported that she did not need to void prior to transfer.  IV pitocin infusing well at 62.5 ml/hr.  No s/s of infiltration or c/o pain at IV site,.  Report given to nurse Lassiter, RN.

## 2013-04-12 NOTE — Progress Notes (Addendum)
Karen Shields is a 22 y.o. G2P0001 at [redacted]w[redacted]d admitted for active labor  PNL Neg on review of chart, O+, RI/HIV -/GBS neg/ HepBAb Neg/ GC/C neg Pt does have several UDS + for amphetamines, cannabis, opiates Pt on Valtrex? Not reported  Subjective: Pt in pain currently. Requests additional IV meds  Objective: BP 101/73  Pulse 97  Temp(Src) 97.9 F (36.6 C) (Oral)  Resp 22  Ht 5' 3.5" (1.613 m)  Wt 162 lb 12.8 oz (73.846 kg)  BMI 28.38 kg/m2  SpO2 96%  LMP 07/03/2012      FHT:  FHR: 130s bpm, variability: moderate,  accelerations:  Present,  decelerations:  Absent UC:   regular, every 3-4 minutes SVE:   Dilation: 8 Effacement (%): 90 Station: -2 Exam by:: Thayer Ohm by myself SSE: No HSV lesions seen on external or internal exam   Labs: Lab Results  Component Value Date   WBC 17.5* 04/12/2013   HGB 12.1 04/12/2013   HCT 34.1* 04/12/2013   MCV 86.8 04/12/2013   PLT 233 04/12/2013    Assessment / Plan: Spontaneous labor, progressing normally  Labor: Progressing normally Preeclampsia:  no signs or symptoms of toxicity Fetal Wellbeing:  Category I Pain Control:  Fentanyl, desires epidural I/D:  GBS Neg, 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 Continue valtrex. No lesions seen on exam Anticipated MOD:  NSVD Drug Hx:  UDS (prev + amphetamines, Opiates, Cannabis)   Cerissa Zeiger, RYAN 04/12/2013, 1:51 PM

## 2013-04-12 NOTE — Progress Notes (Signed)
Order given per Dr. Ike Bene to adm. Fentanyl 100 mcg. x1 for pain.  Pt. Reports pain as 8/10.    Dr. Ike Bene talking to her about POC in labor.  Marland Kitchen

## 2013-04-13 LAB — CBC
MCHC: 35 g/dL (ref 30.0–36.0)
RDW: 13.5 % (ref 11.5–15.5)

## 2013-04-13 NOTE — Progress Notes (Signed)
UR chart review completed.  

## 2013-04-13 NOTE — Progress Notes (Signed)
Clinical Social Work Department PSYCHOSOCIAL ASSESSMENT - MATERNAL/CHILD 04/13/2013  Patient:  Preyer,Caraline B  Account Number:  [id number]  Admit Date:  04/12/2013  Childs Name:   Journie Groene    Clinical Social Worker:  Halea Lieb, LCSW   Date/Time:  04/13/2013 03:00 PM  Date Referred:  04/13/2013   Referral source  CN     Referred reason  Substance Abuse  Behavioral Health Issues   Other referral source:    I:  FAMILY / HOME ENVIRONMENT Child's legal guardian:  PARENT  Guardian - Name Guardian - Age Guardian - Address  Jailyn Billingham [address] [place], [place] [address]  Scotty Galindez  same   Other household support members/support persons Name Relationship DOB  Arianna DAUGHTER 5  Kailey DAUGHTER 2.5   Other support:   MOB states she has a great support system.    II  PSYCHOSOCIAL DATA Information Source:  Patient Interview  Financial and Community Resources Employment:   Financial resources:  Medicaid If Medicaid - County:  [place]  School / Grade:   Maternity Care Coordinator / Child Services Coordination / Early Interventions:  Cultural issues impacting care:   None stated    III  STRENGTHS Strengths  Adequate Resources  Compliance with medical plan  Home prepared for Child (including basic supplies)  Other - See comment  Supportive family/friends   Strength comment:  Pediatric follow up will be with Dr. Scott Luking   IV  RISK FACTORS AND CURRENT PROBLEMS Current Problem:  YES   Risk Factor & Current Problem Patient Issue Family Issue Risk Factor / Current Problem Comment  Mental Illness Y N MOB-Anx  Substance Abuse Y N MOB-marijuana use   N N     V  SOCIAL WORK ASSESSMENT CSW met with MOB in her first floor room/146 to complete assessment for hx of anx and postive UDS during pregnancy for marijuana, amphetamines and opiates.  MOB was extremely pleasant and welcomed CSW in to speak with her.  She states she and baby are doing  well.  CSW inquired about her hx of anxiety and reviewed signs and sypmtoms of PPD.  She currently has an rx for Xanax and takes when needed.  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 CSW offered to make referral for outpatient counseling, but MOB declined at this time.  CSW asked about marijuana use and possible other drugs showing on a UDS during pregnancy. MOB states she smoked marijuana approximately 2-3 times during pregnancy for nausea and admits to taking a hydrocodone not prescribed to her for back pain once.  She does not know what would have caused a positive amphetamine and denies all other drug use.  (CSW notes in patient's record that she had an rx for Strattera at one time, which could cause a positive for amphetamine, but is concerned that patient may not be completely forthcoming with information as CSW noted a hx of Cocaine use in 2008.)  CSW explained hospital drug screen policy and mandatory reporting to Child Protective Services for positive results.  MOB was understanding, tearful and concerned. CSW asked if she has had a hx with CPS at any time and she said no. (CSW confirmed this with CPS in [place] CSW has no further questions at this time and does not identify barriers to discharge.  CSW will monitor MDS results and make report if necessary.        VI SOCIAL WORK PLAN Social Work Plan  No Further Intervention Required / No Barriers to Discharge  Patient/Family Education   Type of pt/family education:   PPD signs and symptoms  Hospital drug screen policy   If child protective services report - county:   If child protective services report - date:   Information/referral to community resources comment:   No referrals made at this time.   Other social work plan:   CSW will monitor MDS results.    

## 2013-04-13 NOTE — Lactation Note (Signed)
This note was copied from the chart of Karen Lorane Penaloza. Lactation Consultation Note Mom states br feeding is going well; states baby latches well; mom denies pain. Mom states she is already leaking milk and requests breast pads (provided). Mom also request hand pump (provided, with instructions). Reviewed Baby and Me book; discharge teaching completed, all questions answered. Enc mom to call lactation office if she has any concerns, and to attend the BFSG. Follow up PRN.  Patient Name: Karen Shields Date: 04/13/2013 Reason for consult: Follow-up assessment   Maternal Data    Feeding    LATCH Score/Interventions                      Lactation Tools Discussed/Used     Consult Status Consult Status: PRN    Lenard Forth 04/13/2013, 12:01 PM

## 2013-04-13 NOTE — Anesthesia Postprocedure Evaluation (Signed)
  Anesthesia Post-op Note  Patient: Karen Shields  Procedure(s) Performed: * No procedures listed *  Patient Location: PACU and Mother/Baby  Anesthesia Type:Epidural  Level of Consciousness: awake, alert , oriented and patient cooperative  Airway and Oxygen Therapy: Patient Spontanous Breathing  Post-op Pain: mild  Post-op Assessment: Patient's Cardiovascular Status Stable, Respiratory Function Stable, Patent Airway, No signs of Nausea or vomiting and Adequate PO intake  Post-op Vital Signs: Reviewed and stable  Complications: No apparent anesthesia complications

## 2013-04-13 NOTE — Progress Notes (Addendum)
Post Partum Day 1 Subjective: no complaints, up ad lib, voiding, tolerating PO and + flatus  Objective: Blood pressure 125/69, pulse 84, temperature 97.9 F (36.6 C), temperature source Oral, resp. rate 18, height 5' 3.5" (1.613 m), weight 162 lb 12.8 oz (73.846 kg), last menstrual period 07/03/2012, SpO2 100.00%.  Physical Exam:  General: alert, cooperative and no distress Lochia: appropriate Uterine Fundus: firm @U -1 Incision: NA DVT Evaluation: No evidence of DVT seen on physical exam. Negative Homan's sign. No cords or calf tenderness. No significant calf/ankle edema.   Recent Labs  04/12/13 1120 04/13/13 0545  HGB 12.1 11.0*  HCT 34.1* 31.4*   Drugs of Abuse     Component Value Date/Time   LABOPIA NONE DETECTED 04/12/2013 2005   COCAINSCRNUR NONE DETECTED 04/12/2013 2005   LABBENZ NONE DETECTED 04/12/2013 2005   AMPHETMU NONE DETECTED 04/12/2013 2005   THCU NONE DETECTED 04/12/2013 2005   LABBARB NONE DETECTED 04/12/2013 2005     Assessment/Plan: Plan for discharge tomorrow, Breastfeeding, Lactation consult and Social Work consult Plan for out patient BTL with primary OB UDS neg, pending social work consult Desires tubal but paperwork not with PN chart, may have as outpatient.   LOS: 1 day   Iqra Rotundo, RYAN 04/13/2013, 7:20 AM

## 2013-04-14 DIAGNOSIS — O99320 Drug use complicating pregnancy, unspecified trimester: Secondary | ICD-10-CM | POA: Clinically undetermined

## 2013-04-14 DIAGNOSIS — IMO0002 Reserved for concepts with insufficient information to code with codable children: Secondary | ICD-10-CM | POA: Clinically undetermined

## 2013-04-14 MED ORDER — IBUPROFEN 600 MG PO TABS: 600.0000 mg | ORAL_TABLET | Freq: Four times a day (QID) | ORAL | Status: DC

## 2013-04-14 NOTE — Discharge Summary (Signed)
Obstetric Discharge Summary Reason for Admission: onset of labor Prenatal Procedures: NST Intrapartum Procedures: spontaneous vaginal delivery Postpartum Procedures: none Complications-Operative and Postpartum: none Hemoglobin  Date Value Range Status  04/13/2013 11.0* 12.0 - 15.0 g/dL Final     HCT  Date Value Range Status  04/13/2013 31.4* 36.0 - 46.0 % Final  Hospital Course: Karen Shields is a 22 y.o. female G2P0001 @ [redacted]w[redacted]d presenting for labor evaluation. She started contracting regularly this morning, with contractions getting stronger over time. She received her prenatal care in [place] but came to [facility] because her husband wants her to deliver here. Her records were requested upon arrival. She reports good fetal movement, denies LOF, vaginal bleeding, vaginal itching/burning, urinary symptoms, h/a, dizziness, n/v, or fever/chills.  Per the pt, her hx is significant for an uncomplicated NSVD of her first child, anxiety which she takes Xanax for 3x/week, and mild asthma but she has not used an inhaler in months. She reports her GBS screening was negative a few weeks ago in her prenatal office. She also reports a normal 1 hour GTT, denies HTN during the pregnancy, and reports her anatomy U/S was normal except they were not able to view the spine very well. She was told she was low risk for Down's Syndrome after labs were drawn in early pregnancy.  Delivery  04/12/2013 3:54 PM by Vaginal, Spontaneous Delivery  Sex: female Gestational Age: <None>  Delivery Clinician: Jolyn Lent  Living?: Yes  APGARS  One minute  Five minutes  Ten minutes   Skin color:  1  1    Heart rate:  2  2    Grimace:  2  2    Muscle tone:  2  2    Breathing:  2  2    Totals:  9  9    Presentation/position: Vertex Left Occiput Anterior  Resuscitation:    Cord information: 3 vessels Disposition of cord blood: No Blood gases sent? No  Complications:  None   Placenta: Delivered: 04/12/2013 4:03 PM  Spontaneous Intact appearance     Physical Exam:  General: alert, cooperative and no distress Lochia: appropriate Uterine Fundus: firm Incision: n/a DVT Evaluation: No evidence of DVT seen on physical exam.  Discharge Diagnoses: Term Pregnancy-delivered  Discharge Information: Date: 04/14/2013 Activity: pelvic rest Diet: routine Medications: PNV and Ibuprofen Condition: stable and improved Instructions: refer to practice specific booklet Discharge to: home Follow-up Information   [facility] with [facility]. Schedule an appointment as soon as possible for a visit in 6 weeks.   Contact information:   [phone]       Newborn Data: Live born female  Birth Weight: 6 lb 4 oz (2835 g) APGAR: 9, 9  Home with mother.  Social work has seen her, will followup.  [facility] 04/14/2013, 6:47 AM

## 2013-04-15 NOTE — Discharge Summary (Signed)
Attestation of Attending Supervision of Advanced Practitioner: Evaluation and management procedures were performed by the PA/NP/CNM/OB Fellow under my supervision/collaboration. Chart reviewed and agree with management and plan.  Margree Gimbel V 04/15/2013 9:35 PM    

## 2013-04-16 ENCOUNTER — Ambulatory Visit (INDEPENDENT_AMBULATORY_CARE_PROVIDER_SITE_OTHER): Payer: Medicaid Other | Admitting: Family Medicine

## 2013-04-16 ENCOUNTER — Encounter: Payer: Self-pay | Admitting: Family Medicine

## 2013-04-16 VITALS — Temp 98.7°F | Wt 152.8 lb

## 2013-04-16 DIAGNOSIS — R300 Dysuria: Secondary | ICD-10-CM

## 2013-04-16 DIAGNOSIS — N390 Urinary tract infection, site not specified: Secondary | ICD-10-CM

## 2013-04-16 LAB — POCT URINALYSIS DIPSTICK: pH, UA: 8

## 2013-04-16 MED ORDER — CEFPROZIL 500 MG PO TABS: 500.0000 mg | ORAL_TABLET | Freq: Two times a day (BID) | ORAL | Status: DC

## 2013-04-16 NOTE — Progress Notes (Signed)
  Subjective:    Patient ID: Karen Shields, female    DOB: May 13, 1991, 22 y.o.   MRN: [id number]  HPI Having UTI symptoms x 2 days. Patient with some lower abdominal cramping discomfort recently gave birth. Having dysuria urinary free to see. Denies high fever chills or flank pain no other particular problems. No nausea vomiting or diarrhea. PMH benign recent birth of child family history noncontributory ROS see per above  Review of Systems     Objective:   Physical Exam  Lungs clear no crackles heart is regular abdomen is soft no guarding or rebound some lower abdominal tenderness minimal. Urinalysis with wbc's TNTC      Assessment & Plan:  UTI-urine culture recommended plus also recommend antibiotics Cefzil 500 mg twice a day for the next 5 days call us if problems. Warning signs were discussed.

## 2013-04-19 ENCOUNTER — Encounter (HOSPITAL_COMMUNITY): Payer: Self-pay | Admitting: *Deleted

## 2013-04-19 LAB — URINE CULTURE: Colony Count: >=100000

## 2013-05-18 ENCOUNTER — Telehealth: Payer: Self-pay | Admitting: Family Medicine

## 2013-05-18 NOTE — Telephone Encounter (Signed)
Pt is having an issues with hives/rash breaking out on her in the evening time on her arms an legs. She has run out of her xanax and her nights are stressful, she isn't sure if the two coralate with one another. She made an appt for next Thursday and wants to know if she can have a cream to try and possibly enough xanax to make it through to the appt on Thursday?  [facility]

## 2013-05-18 NOTE — Telephone Encounter (Signed)
Refilled Xanax 0.5 mg qd prn (30 no refills).  Clobetasol 0.05% cream BID to rash prn.  Follow up as planned.

## 2013-05-20 ENCOUNTER — Emergency Department (HOSPITAL_COMMUNITY)
Admission: EM | Admit: 2013-05-20 | Discharge: 2013-05-20 | Disposition: A | Discharge status: Home / Self Care | Payer: Medicaid Other | Attending: Emergency Medicine | Admitting: Emergency Medicine

## 2013-05-20 ENCOUNTER — Encounter (HOSPITAL_COMMUNITY): Payer: Self-pay | Admitting: *Deleted

## 2013-05-20 DIAGNOSIS — F17200 Nicotine dependence, unspecified, uncomplicated: Secondary | ICD-10-CM | POA: Insufficient documentation

## 2013-05-20 DIAGNOSIS — F411 Generalized anxiety disorder: Secondary | ICD-10-CM | POA: Insufficient documentation

## 2013-05-20 DIAGNOSIS — J45909 Unspecified asthma, uncomplicated: Secondary | ICD-10-CM | POA: Insufficient documentation

## 2013-05-20 DIAGNOSIS — Z8719 Personal history of other diseases of the digestive system: Secondary | ICD-10-CM | POA: Insufficient documentation

## 2013-05-20 DIAGNOSIS — Z8742 Personal history of other diseases of the female genital tract: Secondary | ICD-10-CM | POA: Insufficient documentation

## 2013-05-20 DIAGNOSIS — Z792 Long term (current) use of antibiotics: Secondary | ICD-10-CM | POA: Insufficient documentation

## 2013-05-20 DIAGNOSIS — L239 Allergic contact dermatitis, unspecified cause: Secondary | ICD-10-CM

## 2013-05-20 DIAGNOSIS — L259 Unspecified contact dermatitis, unspecified cause: Secondary | ICD-10-CM | POA: Insufficient documentation

## 2013-05-20 MED ORDER — HYDROXYZINE PAMOATE 25 MG PO CAPS
25.0000 mg | ORAL_CAPSULE | Freq: Three times a day (TID) | ORAL | Status: DC | PRN
Start: 2013-05-20 — End: 2013-06-11

## 2013-05-20 NOTE — ED Notes (Signed)
Pt c/o rash for the past 5 days, states that it is worse at night and that it itches, pt also states that she ran out of her xanax five days ago and she noticed that she has been scratching more since her last dose.

## 2013-05-20 NOTE — ED Notes (Signed)
According to [facility] notes, pt had prescription for xanax and a cream for the rash called into her pharmacy Friday evening,

## 2013-05-20 NOTE — ED Provider Notes (Signed)
CSN: [id number]     Arrival date & time 05/20/13  1517 History   First MD Initiated Contact with Patient 05/20/13 1638     Chief Complaint  Patient presents with  . Rash   (Consider location/radiation/quality/duration/timing/severity/associated sxs/prior Treatment) Patient is a 22 y.o. female presenting with rash. The history is provided by the patient.  Rash Location:  Leg (bilateral upper arms) Leg rash location:  L upper leg and R upper leg Quality: itchiness   Severity:  Moderate Onset quality:  Gradual Duration:  5 days Timing:  Constant Chronicity:  New Relieved by:  None tried Worsened by:  Nothing tried Ineffective treatments:  Topical steroids Associated symptoms: no abdominal pain, no fever, no headaches, no nausea, no sore throat and not vomiting    Karen Shields is a 22 y.o. female who presents to the ED with a rash that started five days ago. The rash is worse at night. The rash started after she ran out of her xanax five days ago and she thinks that she has hives due to stress. Her PCP called in a refill on her xanax and cream for the rash 2 days ago but the patient has not picked up the Rx. The itching is worse at night.  Past Medical History  Diagnosis Date  . Eczema 2008  . Asthma   . Ovarian cyst   . Asthma   . Anxiety   . GERD (gastroesophageal reflux disease)    Past Surgical History  Procedure Laterality Date  . Ovarian cyst removal    . Ovarian cyst removal  2011   Family History  Problem Relation Age of Onset  . Diabetes Mother    History  Substance Use Topics  . Smoking status: Current Every Day Smoker -- 0.50 packs/day for 5 years    Types: Cigarettes  . Smokeless tobacco: Not on file  . Alcohol Use: No   OB History   Grav Para Term Preterm Abortions TAB SAB Ect Mult Living   2 2 1  0 0 0 0 0 0 2     Review of Systems  Constitutional: Negative for fever and chills.  HENT: Negative for sore throat, trouble swallowing and neck pain.    Gastrointestinal: Negative for nausea, vomiting and abdominal pain.  Skin: Positive for rash.  Neurological: Negative for headaches.  Psychiatric/Behavioral: The patient is nervous/anxious.     Allergies  Review of patient's allergies indicates no known allergies.  Home Medications   Current Outpatient Rx  Name  Route  Sig  Dispense  Refill  . albuterol (PROVENTIL HFA;VENTOLIN HFA) 108 (90 BASE) MCG/ACT inhaler   Inhalation   Inhale 2 puffs into the lungs every 6 (six) hours as needed for wheezing.         Marland Kitchen ALPRAZolam (XANAX) 1 MG tablet   Oral   Take 0.5 mg by mouth daily as needed for anxiety.          . cefPROZIL (CEFZIL) 500 MG tablet   Oral   Take 1 tablet (500 mg total) by mouth 2 (two) times daily.   10 tablet   0   . ibuprofen (ADVIL,MOTRIN) 600 MG tablet   Oral   Take 1 tablet (600 mg total) by mouth every 6 (six) hours.   30 tablet   0    BP 136/84  Pulse 90  Temp(Src) 97.9 F (36.6 C) (Oral)  Resp 18  SpO2 100% Physical Exam  Nursing note and vitals reviewed. Constitutional: She  is oriented to person, place, and time. She appears well-developed and well-nourished. No distress.  HENT:  Head: Normocephalic.  Right Ear: Tympanic membrane normal.  Left Ear: Tympanic membrane normal.  Nose: Nose normal.  Mouth/Throat: Uvula is midline, oropharynx is clear and moist and mucous membranes are normal.  Eyes: Conjunctivae and EOM are normal. Pupils are equal, round, and reactive to light.  Neck: Neck supple.  Cardiovascular: Normal rate, regular rhythm and normal heart sounds.   Pulmonary/Chest: Effort normal and breath sounds normal. She has no wheezes.  Abdominal: Soft. There is no tenderness.  Musculoskeletal: Normal range of motion.  Neurological: She is alert and oriented to person, place, and time. No cranial nerve deficit.  Skin: Rash noted.  Rash noted to inner aspect of palmar aspect bilateral upper arms. Rash to anterior aspect of thighs and  right lower leg.  Psychiatric: She has a normal mood and affect. Her behavior is normal.    ED Course  Procedures   MDM  22 y.o. female with rash and itching. Will treat with Vistaril and she will pick up her prescriptions that are waiting for her at the pharmacy that were called in by her PCP 2 days ago.  Discussed with the patient and all questioned fully answered.    Medication List    TAKE these medications       hydrOXYzine 25 MG capsule  Commonly known as:  VISTARIL  Take 1 capsule (25 mg total) by mouth 3 (three) times daily as needed for itching.      ASK your doctor about these medications       albuterol 108 (90 BASE) MCG/ACT inhaler  Commonly known as:  PROVENTIL HFA;VENTOLIN HFA  Inhale 2 puffs into the lungs every 6 (six) hours as needed for wheezing.     ALPRAZolam 1 MG tablet  Commonly known as:  XANAX  Take 0.5 mg by mouth daily as needed for anxiety.         [facility] Orlene Och, NP 05/20/13 1700

## 2013-05-21 NOTE — ED Provider Notes (Signed)
Medical screening examination/treatment/procedure(s) were performed by non-physician practitioner and as supervising physician I was immediately available for consultation/collaboration. Malan Werk, MD, FACEP   Ghina Bittinger L Kimerly Rowand, MD 05/21/13 0023 

## 2013-05-24 ENCOUNTER — Ambulatory Visit: Payer: Self-pay | Admitting: Nurse Practitioner

## 2013-06-09 ENCOUNTER — Encounter: Payer: Self-pay | Admitting: *Deleted

## 2013-06-11 ENCOUNTER — Encounter: Payer: Self-pay | Admitting: Nurse Practitioner

## 2013-06-11 ENCOUNTER — Ambulatory Visit (INDEPENDENT_AMBULATORY_CARE_PROVIDER_SITE_OTHER): Payer: Medicaid Other | Admitting: Nurse Practitioner

## 2013-06-11 VITALS — BP 110/62 | Ht 63.0 in | Wt 145.0 lb

## 2013-06-11 DIAGNOSIS — F419 Anxiety disorder, unspecified: Secondary | ICD-10-CM

## 2013-06-11 DIAGNOSIS — F411 Generalized anxiety disorder: Secondary | ICD-10-CM

## 2013-06-11 MED ORDER — PAROXETINE HCL ER 25 MG PO TB24: 25.0000 mg | ORAL_TABLET | ORAL | Status: DC

## 2013-06-11 MED ORDER — ALPRAZOLAM 1 MG PO TABS: ORAL_TABLET | ORAL | Status: DC

## 2013-06-14 ENCOUNTER — Encounter: Payer: Self-pay | Admitting: Nurse Practitioner

## 2013-06-14 DIAGNOSIS — F419 Anxiety disorder, unspecified: Secondary | ICD-10-CM | POA: Insufficient documentation

## 2013-06-14 NOTE — Progress Notes (Signed)
Subjective:  Presents for complaints of feeling overwhelmed. Has a newborn. Not breast-feeding. Had a tubal ligation. Has 2 biological children and a stepdaughter. Complaints of emotional lability. Getting angry or crying at times. Does not take this out on her children, states she walks away when she gets angry or frustrated. Overall sleeping well. Denies any suicidal or homicidal thoughts or ideation. Has been treated for anxiety and panic attacks in the past.  Objective:   BP 110/62  Ht 5\' 3"  (1.6 m)  Wt 145 lb (65.772 kg)  BMI 25.69 kg/m2 NAD. Alert, oriented. Mildly anxious affect. Lungs clear. Heart regular rate rhythm.  Assessment:Anxiety  Plan: Meds ordered this encounter  Medications  . PARoxetine (PAXIL-CR) 25 MG 24 hr tablet    Sig: Take 1 tablet (25 mg total) by mouth every morning.    Dispense:  30 tablet    Refill:  2    Order Specific Question:  Supervising Provider    Answer:  Merlyn Albert [[id number]]  . ALPRAZolam (XANAX) 1 MG tablet    Sig: 1/2-1 po BID prn anxiety    Dispense:  60 tablet    Refill:  2    May refill monthly    Order Specific Question:  Supervising Provider    Answer:  Merlyn Albert [[id number]]   Patient defers counseling at this time. Recheck in one month, call back sooner if any problems. Note the patient is taken Paxil without difficulty in the past.

## 2013-06-14 NOTE — Assessment & Plan Note (Signed)
.   PARoxetine (PAXIL-CR) 25 MG 24 hr tablet    Sig: Take 1 tablet (25 mg total) by mouth every morning.    Dispense:  30 tablet    Refill:  2    Order Specific Question:  Supervising Provider    Answer:  Merlyn Albert [[id number]]  . ALPRAZolam (XANAX) 1 MG tablet    Sig: 1/2-1 po BID prn anxiety    Dispense:  60 tablet    Refill:  2    May refill monthly    Order Specific Question:  Supervising Provider    Answer:  Merlyn Albert [[id number]]   Patient defers counseling at this time. Recheck in one month, call back sooner if any problems. Note the patient is taken Paxil without difficulty in the past.

## 2013-09-01 IMAGING — CT CT ABD-PELV W/ CM
2 of 3 series · 16 of 46 positions shown, 18 images · IV contrast (Omnipaque 300)
Comparison: Ultrasound 09/03/2011

CLINICAL DATA: Right lower quadrant pain

CT ABDOMEN AND PELVIS WITH CONTRAST
TECHNIQUE: Multidetector CT imaging of the abdomen and pelvis was
performed following the standard protocol during bolus
administration of intravenous contrast.
Contrast: 100mL OMNIPAQUE IOHEXOL 300 MG/ML IV SOLN

[Series 2: abd_pel_with 5.0 b40f · axial · 0.61mm/px · z∈[+810,+1200]mm · 13 of 90 slices shown, 15 images]
[im 6/90  soft-tissue]
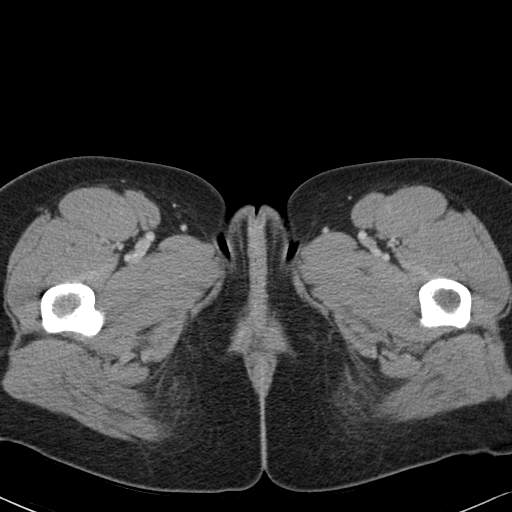
[im 6/90  bone]
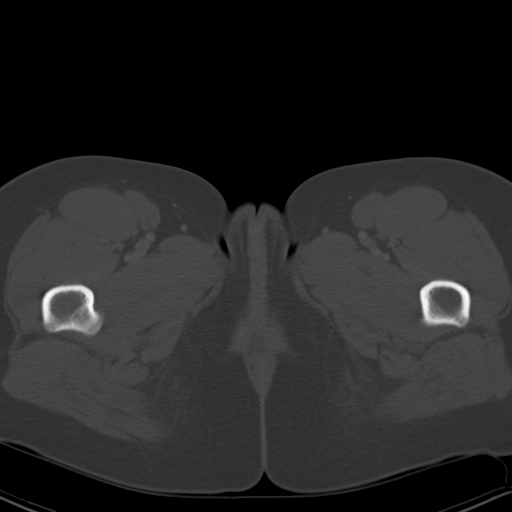
[im 12/90  soft-tissue]
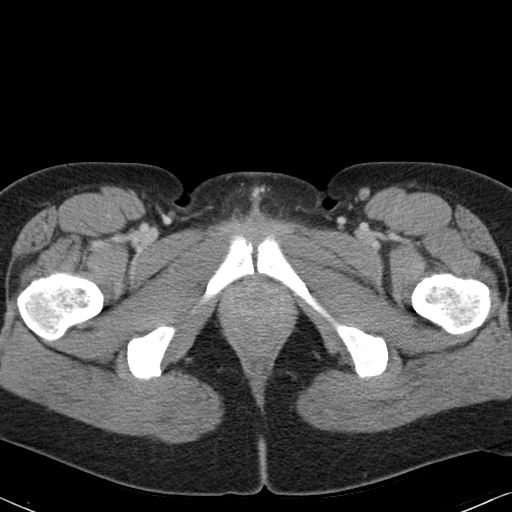
[im 18/90  soft-tissue]
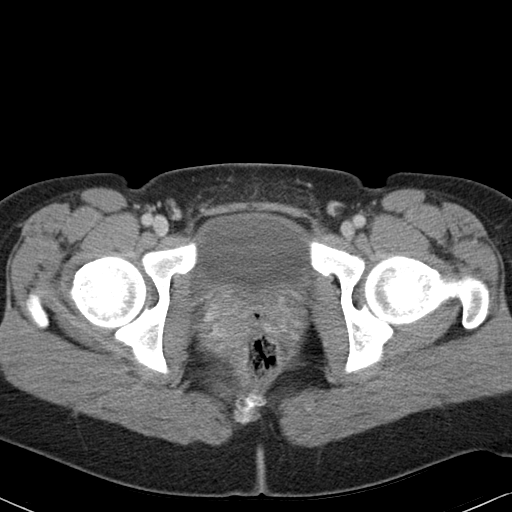
[im 26/90  soft-tissue]
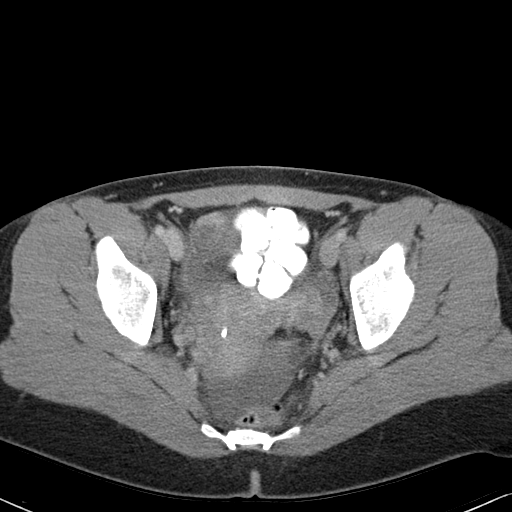
[im 32/90  soft-tissue]
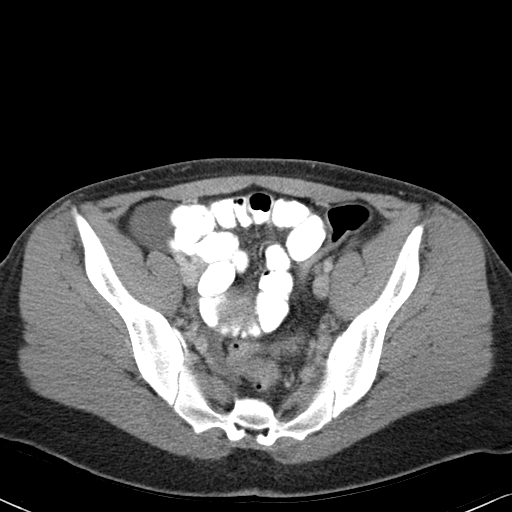
[im 38/90  soft-tissue]
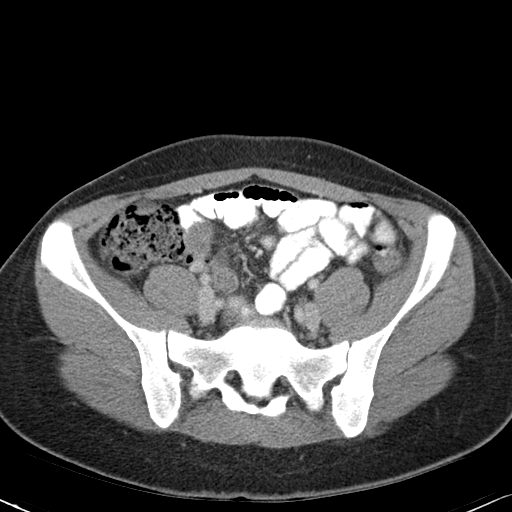
[im 46/90  soft-tissue]
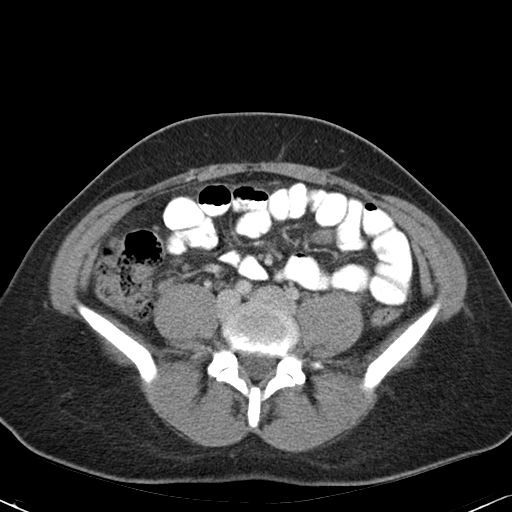
[im 52/90  soft-tissue]
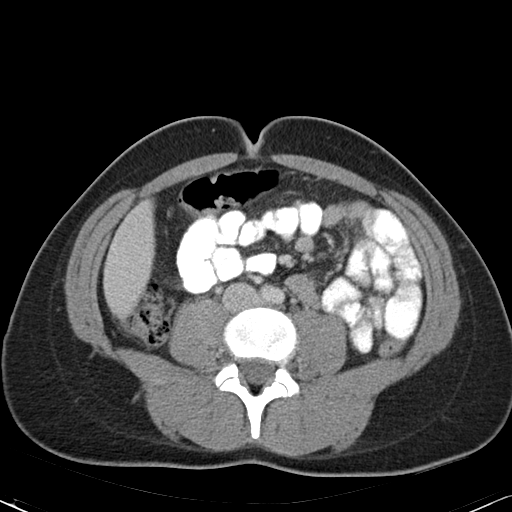
[im 58/90  soft-tissue]
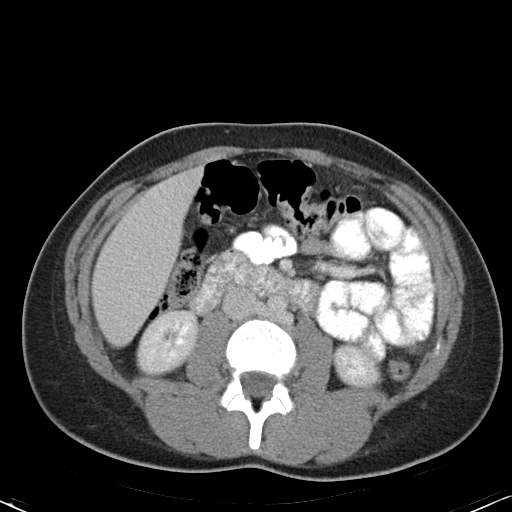
[im 58/90  bone]
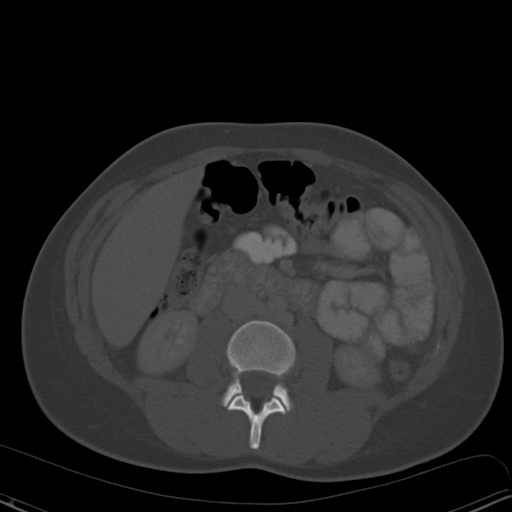
[im 64/90  soft-tissue]
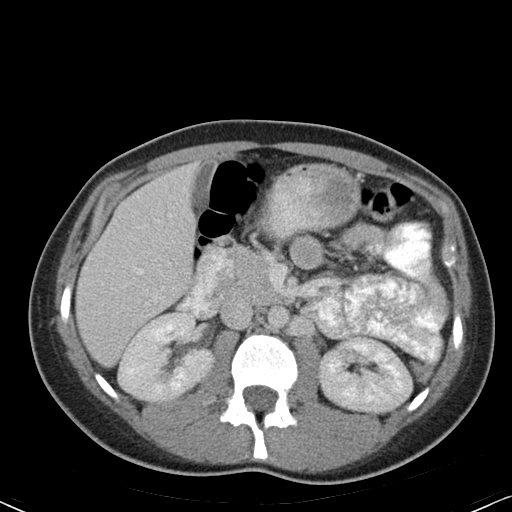
[im 72/90  soft-tissue]
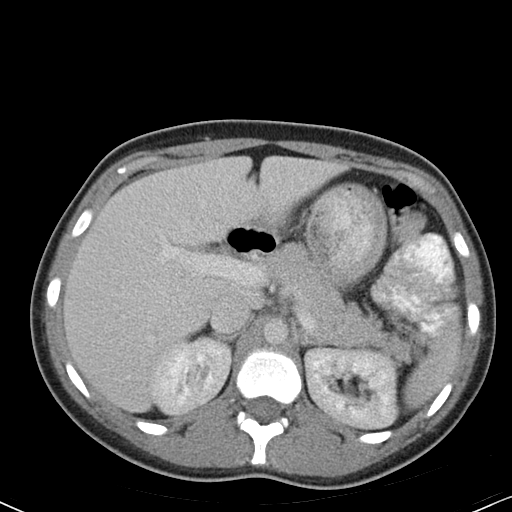
[im 78/90  soft-tissue]
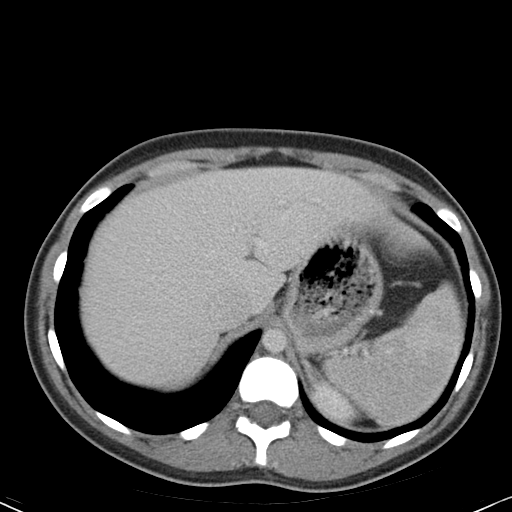
[im 84/90  soft-tissue]
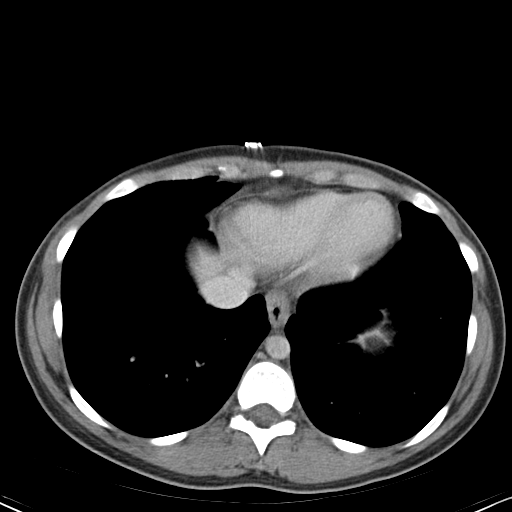

[Series 4: abd_pel_with 3.0 spo cor · coronal · 0.67mm/px · 3 of 80 slices shown]
[im 27/80  soft-tissue]
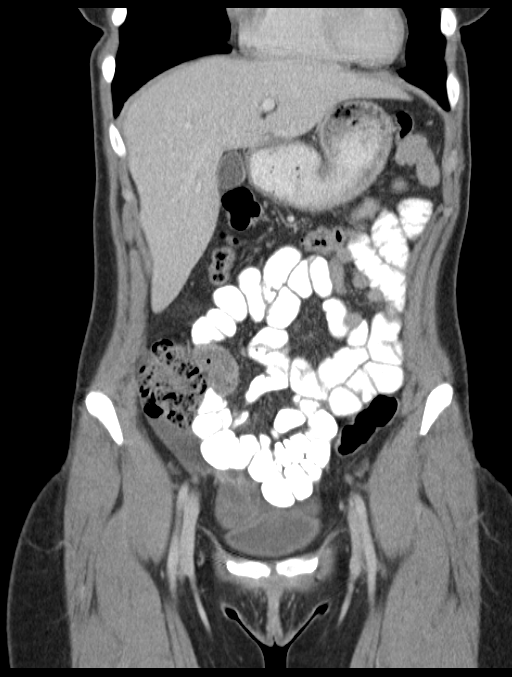
[im 36/80  soft-tissue]
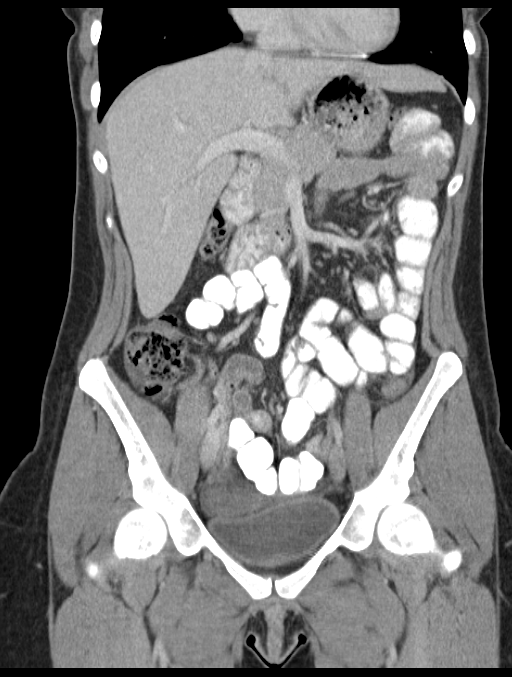
[im 44/80  soft-tissue]
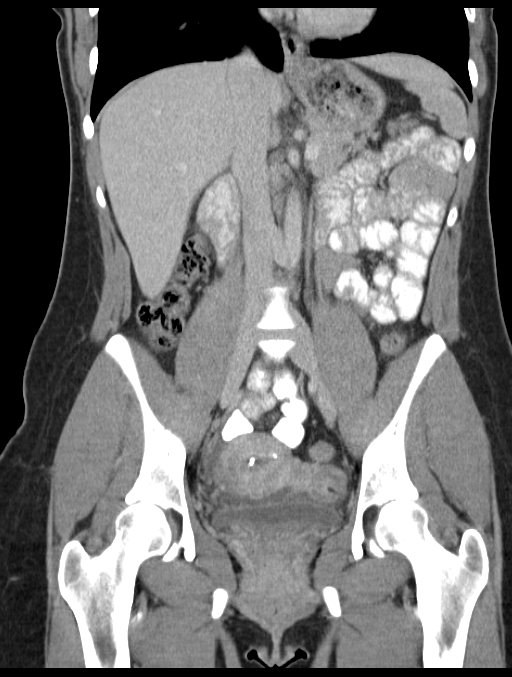

[16 of 46 positions shown; findings below may reference images not displayed]

FINDINGS: As noted on the ultrasound, there is a moderate amount of
free fluid in the pelvis.  There is a cystic structure in the right
anterior pelvis located superior to the bladder anteriorly.  This
has enhancement of the wall and measures approximately 2.8 cm in
diameter.  This may represent a corpus luteum cyst with associated
free fluid from leaking fluid or hemorrhage.

Uterus is normal.  IUD is in good position.

Appendix is normal.  No bowel obstruction.

Liver is normal.  Mild gallbladder wall thickening may be within
normal limits.  No definite gallstones or biliary dilatation.
Pancreas spleen kidneys are normal.
IMPRESSION: Moderate free fluid in the pelvis.  2.8 cm cystic structure right
anterior pelvis may represent a corpus luteum cyst.

Normal appendix.

## 2013-09-11 ENCOUNTER — Other Ambulatory Visit: Payer: Self-pay | Admitting: Nurse Practitioner

## 2013-09-14 ENCOUNTER — Telehealth: Payer: Self-pay | Admitting: Family Medicine

## 2013-09-14 MED ORDER — ALPRAZOLAM 1 MG PO TABS: ORAL_TABLET | ORAL | Status: DC

## 2013-09-14 NOTE — Telephone Encounter (Signed)
Left message on voicemail notifying patient script faxed to pharmacy and to schedule follow up for any further refills.

## 2013-09-14 NOTE — Telephone Encounter (Signed)
Patients needs Rx for Xanax-She is hoping Dr Lorin Picket would do this for her since Eber Jones is not in. Also, she would like a phone call when this is complete.   [place]

## 2013-09-14 NOTE — Telephone Encounter (Signed)
1 refill, needs follow up last seen sept with carolyn

## 2013-09-14 NOTE — Telephone Encounter (Signed)
Patients needs Rx for Xanax-She is hoping Dr Scott would do this for her since Carolyn is not in. Also, she would like a phone call when this is complete. ° ° °[facility] °

## 2013-09-15 NOTE — Telephone Encounter (Signed)
Ok times one 

## 2013-11-14 ENCOUNTER — Telehealth: Payer: Self-pay | Admitting: Family Medicine

## 2013-11-14 ENCOUNTER — Other Ambulatory Visit: Payer: Self-pay

## 2013-11-14 ENCOUNTER — Other Ambulatory Visit: Payer: Self-pay | Admitting: *Deleted

## 2013-11-14 MED ORDER — ALPRAZOLAM 1 MG PO TABS: ORAL_TABLET | ORAL | Status: DC

## 2013-11-14 NOTE — Telephone Encounter (Signed)
Need to know which dose patient is on (1 mg or 0.5 mg). Left message on voicemail for patient to return my call.

## 2013-11-14 NOTE — Telephone Encounter (Signed)
Refill on xanax 0.1mg  to [facility].

## 2013-11-14 NOTE — Telephone Encounter (Signed)
Write only for 15, will need to f u with carolyn before any furher written (we said that in dec, too)

## 2013-11-14 NOTE — Telephone Encounter (Signed)
Left message notifying patient that RX was faxed to pharmacy.

## 2013-11-16 ENCOUNTER — Encounter (HOSPITAL_COMMUNITY): Payer: Self-pay | Admitting: Emergency Medicine

## 2013-11-16 ENCOUNTER — Emergency Department (HOSPITAL_COMMUNITY)
Admission: EM | Admit: 2013-11-16 | Discharge: 2013-11-16 | Disposition: A | Discharge status: Home / Self Care | Payer: Medicaid Other | Attending: Emergency Medicine | Admitting: Emergency Medicine

## 2013-11-16 DIAGNOSIS — Z8719 Personal history of other diseases of the digestive system: Secondary | ICD-10-CM | POA: Insufficient documentation

## 2013-11-16 DIAGNOSIS — Z0000 Encounter for general adult medical examination without abnormal findings: Secondary | ICD-10-CM

## 2013-11-16 DIAGNOSIS — Z711 Person with feared health complaint in whom no diagnosis is made: Secondary | ICD-10-CM | POA: Insufficient documentation

## 2013-11-16 DIAGNOSIS — J45909 Unspecified asthma, uncomplicated: Secondary | ICD-10-CM | POA: Insufficient documentation

## 2013-11-16 DIAGNOSIS — F17200 Nicotine dependence, unspecified, uncomplicated: Secondary | ICD-10-CM | POA: Insufficient documentation

## 2013-11-16 DIAGNOSIS — Z8742 Personal history of other diseases of the female genital tract: Secondary | ICD-10-CM | POA: Insufficient documentation

## 2013-11-16 DIAGNOSIS — Z872 Personal history of diseases of the skin and subcutaneous tissue: Secondary | ICD-10-CM | POA: Insufficient documentation

## 2013-11-16 DIAGNOSIS — Z79899 Other long term (current) drug therapy: Secondary | ICD-10-CM | POA: Insufficient documentation

## 2013-11-16 DIAGNOSIS — F411 Generalized anxiety disorder: Secondary | ICD-10-CM | POA: Insufficient documentation

## 2013-11-16 LAB — RAPID URINE DRUG SCREEN, HOSP PERFORMED
Amphetamines: NOT DETECTED
BARBITURATES: NOT DETECTED
BENZODIAZEPINES: POSITIVE — AB
COCAINE: NOT DETECTED
Opiates: NOT DETECTED
TETRAHYDROCANNABINOL: NOT DETECTED

## 2013-11-16 NOTE — ED Notes (Signed)
Patient requesting drug screen.

## 2013-11-16 NOTE — ED Provider Notes (Signed)
CSN: [id number]     Arrival date & time 11/16/13  0051 History   First MD Initiated Contact with Patient 11/16/13 [phone]     Chief complaint: Wants drug screen done  (Consider location/radiation/quality/duration/timing/severity/associated sxs/prior Treatment) The history is provided by the patient.   23 year old female comes in requesting a drug screen and. She states that she has a prescription for alprazolam and keeps it in her bra so that people might when a steel do not have access to it. Her husband thought that she might have had another medication hidden in her proximal and looking her nose and thought he saw cocaine powder. Patient denies using cocaine but she says she did use a narcotic painkiller about one to 2 days ago. She states she's not use cocaine since she was 16 or 17 and she denies other drug use.  Past Medical History  Diagnosis Date  . Eczema 2008  . Asthma   . Ovarian cyst   . Asthma   . Anxiety   . GERD (gastroesophageal reflux disease)    Past Surgical History  Procedure Laterality Date  . Ovarian cyst removal    . Ovarian cyst removal  2011   Family History  Problem Relation Age of Onset  . Diabetes Mother    History  Substance Use Topics  . Smoking status: Current Every Day Smoker -- 0.50 packs/day for 5 years    Types: Cigarettes  . Smokeless tobacco: Not on file  . Alcohol Use: No   OB History   Grav Para Term Preterm Abortions TAB SAB Ect Mult Living   2 2 1  0 0 0 0 0 0 2     Review of Systems  All other systems reviewed and are negative.      Allergies  Review of patient's allergies indicates no known allergies.  Home Medications   Current Outpatient Rx  Name  Route  Sig  Dispense  Refill  . albuterol (PROVENTIL HFA;VENTOLIN HFA) 108 (90 BASE) MCG/ACT inhaler   Inhalation   Inhale 2 puffs into the lungs every 6 (six) hours as needed for wheezing.         Marland Kitchen. ALPRAZolam (XANAX) 1 MG tablet      1/2-1 po BID prn anxiety   15  tablet   0     NEEDS OFFICE VISIT BEFORE FURTHER REFILLS   . PARoxetine (PAXIL-CR) 25 MG 24 hr tablet   Oral   Take 1 tablet (25 mg total) by mouth every morning.   30 tablet   2    BP 112/59  Pulse 85  Temp(Src) 97.8 F (36.6 C) (Oral)  Resp 20  Ht 5\' 4"  (1.626 m)  Wt 150 lb (68.04 kg)  BMI 25.73 kg/m2  SpO2 100%  LMP 10/19/2013 Physical Exam  Nursing note and vitals reviewed.  23 year old female, resting comfortably and in no acute distress. Vital signs are normal. Oxygen saturation is 100%, which is normal. Head is normocephalic and atraumatic. PERRLA, EOMI. Oropharynx is clear. Nasal cavity appears normal. Neck is nontender and supple without adenopathy or JVD. Back is nontender and there is no CVA tenderness. Lungs are clear without rales, wheezes, or rhonchi. Chest is nontender. Heart has regular rate and rhythm without murmur. Abdomen is soft, flat, nontender without masses or hepatosplenomegaly and peristalsis is normoactive. Extremities have no cyanosis or edema, full range of motion is present. Skin is warm and dry without rash. Neurologic: Mental status is normal, cranial nerves are intact,  there are no motor or sensory deficits.  ED Course  Procedures (including critical care time) Labs Review Results for orders placed during the hospital encounter of 11/16/13  URINE RAPID DRUG SCREEN (HOSP PERFORMED)      Result Value Ref Range   Opiates NONE DETECTED  NONE DETECTED   Cocaine NONE DETECTED  NONE DETECTED   Benzodiazepines POSITIVE (*) NONE DETECTED   Amphetamines NONE DETECTED  NONE DETECTED   Tetrahydrocannabinol NONE DETECTED  NONE DETECTED   Barbiturates NONE DETECTED  NONE DETECTED    MDM   Final diagnoses:  None    Patient wishing a drug screen to prove no use of cocaine. Advised her that alprazolam and narcotic painkillers would likely show up as positives in her drug screen. Urine is sent for drug screen.  Drug screen is positive for  benzodiazepines which is consistent with her a prescription for alprazolam. It is negative for cocaine.  Dione Booze, MD 11/16/13 [phone]

## 2013-11-16 NOTE — Discharge Instructions (Signed)
Your drug screen did not show any cocaine in your system.

## 2014-01-04 ENCOUNTER — Telehealth: Payer: Self-pay | Admitting: Family Medicine

## 2014-01-04 NOTE — Telephone Encounter (Signed)
ERROR

## 2014-01-11 ENCOUNTER — Ambulatory Visit: Payer: Medicaid Other | Admitting: Nurse Practitioner

## 2014-01-17 ENCOUNTER — Encounter: Payer: Self-pay | Admitting: Nurse Practitioner

## 2014-01-17 ENCOUNTER — Ambulatory Visit (INDEPENDENT_AMBULATORY_CARE_PROVIDER_SITE_OTHER): Payer: Medicaid Other | Admitting: Nurse Practitioner

## 2014-01-17 VITALS — BP 132/82 | Ht 63.0 in | Wt 153.0 lb

## 2014-01-17 DIAGNOSIS — R300 Dysuria: Secondary | ICD-10-CM

## 2014-01-17 DIAGNOSIS — L301 Dyshidrosis [pompholyx]: Secondary | ICD-10-CM

## 2014-01-17 DIAGNOSIS — F411 Generalized anxiety disorder: Secondary | ICD-10-CM

## 2014-01-17 DIAGNOSIS — F419 Anxiety disorder, unspecified: Secondary | ICD-10-CM

## 2014-01-17 MED ORDER — PAROXETINE HCL 20 MG PO TABS: 20.0000 mg | ORAL_TABLET | Freq: Every day | ORAL | Status: DC

## 2014-01-17 MED ORDER — TRIAMCINOLONE ACETONIDE 0.1 % EX CREA
1.0000 | TOPICAL_CREAM | Freq: Two times a day (BID) | CUTANEOUS | Status: DC
Start: 2014-01-17 — End: 2014-08-22

## 2014-01-17 MED ORDER — ALPRAZOLAM 1 MG PO TABS: ORAL_TABLET | ORAL | Status: DC

## 2014-01-17 MED ORDER — SULFAMETHOXAZOLE-TMP DS 800-160 MG PO TABS: 1.0000 | ORAL_TABLET | Freq: Two times a day (BID) | ORAL | Status: DC

## 2014-01-19 ENCOUNTER — Encounter: Payer: Self-pay | Admitting: Nurse Practitioner

## 2014-01-20 LAB — POCT UA - MICROSCOPIC ONLY
Bacteria, U Microscopic: POSITIVE
RBC, urine, microscopic: 0
WBC, Ur, HPF, POC: 0

## 2014-01-20 NOTE — Progress Notes (Signed)
Subjective:  Presents for several complaints. Dry itchy rash on fingers and hands, small clear fluid filled bumps at times. Mainly on right hand. No known allergens. Foul smelling, dark, cloudy urine off/on x 3 months. No fever. No dysuria, urgency or frequency. Same sexual partner. occas mid left back pain, none this week. No vaginal discharge or pelvic pain. BTL for birth control. Off Paxil x 3 months. Has increased anxiety. Also some relationship issues. Out of xanax, takes 0-3 per day depending on level of anxiety. Uninsured.   Objective:   BP 132/82  Ht 5\' 3"  (1.6 m)  Wt 153 lb (69.4 kg)  BMI 27.11 kg/m2 NAD. Alert, oriented. Mildly anxious affect. Lungs clear. No CVA or flank tenderness. Heart RRR. Abdomen soft, nondistended, nontender. Small areas of nonerythematous dry skin on right hand and fingers. Rare small papule.   Results for orders placed in visit on 01/17/14  POCT UA - MICROSCOPIC ONLY      Result Value Ref Range   WBC, Ur, HPF, POC 0     RBC, urine, microscopic 0     Bacteria, U Microscopic pos     Mucus, UA       Epithelial cells, urine per micros rare     Crystals, Ur, HPF, POC       Casts, Ur, LPF, POC       Yeast, UA        Assessment:  Problem List Items Addressed This Visit     Other   Anxiety - Primary   Relevant Medications      PARoxetine (PAXIL) tablet      ALPRAZolam Prudy Feeler(XANAX) tablet    Other Visit Diagnoses   Dysuria        Relevant Orders       POCT UA - Microscopic Only       POCT urinalysis dipstick    Dyshidrotic eczema        Relevant Medications       triamcinolone cream (KENALOG) 0.1 %      Plan: Meds ordered this encounter  Medications  . triamcinolone cream (KENALOG) 0.1 %    Sig: Apply 1 application topically 2 (two) times daily. Prn rash; use up to 2 weeks    Dispense:  30 g    Refill:  0    Order Specific Question:  Supervising Provider    Answer:  Merlyn AlbertLUKING, WILLIAM S [[id number]]  . PARoxetine (PAXIL) 20 MG tablet    Sig: Take 1  tablet (20 mg total) by mouth daily.    Dispense:  90 tablet    Refill:  1    Order Specific Question:  Supervising Provider    Answer:  Merlyn AlbertLUKING, WILLIAM S [[id number]]  . ALPRAZolam (XANAX) 1 MG tablet    Sig: 1/2-1 po BID prn anxiety    Dispense:  60 tablet    Refill:  2    May refill monthly    Order Specific Question:  Supervising Provider    Answer:  Merlyn AlbertLUKING, WILLIAM S [[id number]]  . sulfamethoxazole-trimethoprim (BACTRIM DS) 800-160 MG per tablet    Sig: Take 1 tablet by mouth 2 (two) times daily.    Dispense:  14 tablet    Refill:  0    Order Specific Question:  Supervising Provider    Answer:  Merlyn AlbertLUKING, WILLIAM S [[id number]]  due to finances, hold on urine culture. Also recommend STD testing at local HD if that becomes a concern. Limit Xanax to no more than 2 per  day. Restart Paxil as directed. Call back if worsens or persists. Return in about 6 months (around 07/19/2014).

## 2014-01-21 LAB — POCT URINALYSIS DIPSTICK
Spec Grav, UA: 1.015
pH, UA: 6

## 2014-03-18 ENCOUNTER — Encounter (HOSPITAL_COMMUNITY): Payer: Self-pay | Admitting: Emergency Medicine

## 2014-03-18 ENCOUNTER — Emergency Department (HOSPITAL_COMMUNITY)
Admission: EM | Admit: 2014-03-18 | Discharge: 2014-03-18 | Disposition: A | Discharge status: Home / Self Care | Payer: Medicaid Other | Attending: Emergency Medicine | Admitting: Emergency Medicine

## 2014-03-18 DIAGNOSIS — IMO0002 Reserved for concepts with insufficient information to code with codable children: Secondary | ICD-10-CM | POA: Insufficient documentation

## 2014-03-18 DIAGNOSIS — F17200 Nicotine dependence, unspecified, uncomplicated: Secondary | ICD-10-CM | POA: Insufficient documentation

## 2014-03-18 DIAGNOSIS — Z8742 Personal history of other diseases of the female genital tract: Secondary | ICD-10-CM | POA: Insufficient documentation

## 2014-03-18 DIAGNOSIS — Z792 Long term (current) use of antibiotics: Secondary | ICD-10-CM | POA: Insufficient documentation

## 2014-03-18 DIAGNOSIS — Z8719 Personal history of other diseases of the digestive system: Secondary | ICD-10-CM | POA: Insufficient documentation

## 2014-03-18 DIAGNOSIS — H7201 Central perforation of tympanic membrane, right ear: Secondary | ICD-10-CM

## 2014-03-18 DIAGNOSIS — H60399 Other infective otitis externa, unspecified ear: Secondary | ICD-10-CM | POA: Insufficient documentation

## 2014-03-18 DIAGNOSIS — H6091 Unspecified otitis externa, right ear: Secondary | ICD-10-CM

## 2014-03-18 DIAGNOSIS — F411 Generalized anxiety disorder: Secondary | ICD-10-CM | POA: Insufficient documentation

## 2014-03-18 DIAGNOSIS — H7200 Central perforation of tympanic membrane, unspecified ear: Secondary | ICD-10-CM | POA: Insufficient documentation

## 2014-03-18 DIAGNOSIS — Z79899 Other long term (current) drug therapy: Secondary | ICD-10-CM | POA: Insufficient documentation

## 2014-03-18 DIAGNOSIS — Z872 Personal history of diseases of the skin and subcutaneous tissue: Secondary | ICD-10-CM | POA: Insufficient documentation

## 2014-03-18 DIAGNOSIS — J45909 Unspecified asthma, uncomplicated: Secondary | ICD-10-CM | POA: Insufficient documentation

## 2014-03-18 MED ORDER — HYDROCODONE-ACETAMINOPHEN 5-325 MG PO TABS: 1.0000 | ORAL_TABLET | ORAL | Status: DC | PRN

## 2014-03-18 MED ORDER — NEOMYCIN-POLYMYXIN-HC 3.5-10000-1 OT SUSP: 4.0000 [drp] | Freq: Three times a day (TID) | OTIC | Status: DC

## 2014-03-18 MED ORDER — NAPROXEN 375 MG PO TABS: 375.0000 mg | ORAL_TABLET | Freq: Two times a day (BID) | ORAL | Status: DC

## 2014-03-18 MED ORDER — AMOXICILLIN-POT CLAVULANATE 875-125 MG PO TABS: 1.0000 | ORAL_TABLET | Freq: Two times a day (BID) | ORAL | Status: DC

## 2014-03-18 NOTE — ED Provider Notes (Signed)
CSN: [id number]     Arrival date & time 03/18/14  1446 History   First MD Initiated Contact with Patient 03/18/14 1702  This chart was scribed for non-physician practitioner Janne NapoleonHope M Neese working with Hilario Quarryanielle S Ray, MD by Valera CastleSteven Perry, ED scribe. This patient was seen in room APFT21/APFT21 and the patient's care was started at 5:53 PM.    Chief Complaint  Patient presents with  . Otalgia    (Consider location/radiation/quality/duration/timing/severity/associated sxs/prior Treatment) Patient is a 23 y.o. female presenting with ear pain. The history is provided by the patient. No language interpreter was used.  Otalgia Location:  Right Quality:  Sore Severity:  Moderate Onset quality:  Sudden Duration:  1 week Timing:  Intermittent Progression:  Unchanged Chronicity:  New Context: water   Relieved by: vinegar. Associated symptoms: hearing loss   Associated symptoms: no fever    HPI Comments: Titus DubinKendall B Spells is a 23 y.o. female who presents to the Emergency Department complaining of intermittent right otalgia, onset 1 week ago, with an associated echo in her ear and decreased hearing. She reports swimming recently. She states she did not spend much time under the water, stating she mostly sun bathed. She states she put 1 ml vinegar in her ear with some relief, but states a relative squired a syringe of hot water in her ear at that time. She states prior to this incident she was only experiencing the hearing loss, but since she has experienced the pain. She reports h/o swimmers ear when she was younger. She denies fever, chills, and any other associated symptoms.   Past Medical History  Diagnosis Date  . Eczema 2008  . Asthma   . Ovarian cyst   . Asthma   . Anxiety   . GERD (gastroesophageal reflux disease)    Past Surgical History  Procedure Laterality Date  . Ovarian cyst removal    . Ovarian cyst removal  2011  . Tubal ligation     Family History  Problem Relation Age of  Onset  . Diabetes Mother    History  Substance Use Topics  . Smoking status: Current Every Day Smoker -- 0.50 packs/day for 5 years    Types: Cigarettes  . Smokeless tobacco: Not on file  . Alcohol Use: No   OB History   Grav Para Term Preterm Abortions TAB SAB Ect Mult Living   2 2 1  0 0 0 0 0 0 2     Review of Systems  Constitutional: Negative for fever and chills.  HENT: Positive for ear pain and hearing loss.   all other systems negative  Allergies  Review of patient's allergies indicates no known allergies.  Home Medications   Prior to Admission medications   Medication Sig Start Date End Date Taking? Authorizing Provider  albuterol (PROVENTIL HFA;VENTOLIN HFA) 108 (90 BASE) MCG/ACT inhaler Inhale 2 puffs into the lungs every 6 (six) hours as needed for wheezing.    Historical Provider, MD  ALPRAZolam Prudy Feeler(XANAX) 1 MG tablet 1/2-1 po BID prn anxiety 01/17/14   Campbell Richesarolyn C Hoskins, NP  PARoxetine (PAXIL) 20 MG tablet Take 1 tablet (20 mg total) by mouth daily. 01/17/14   Campbell Richesarolyn C Hoskins, NP  sulfamethoxazole-trimethoprim (BACTRIM DS) 800-160 MG per tablet Take 1 tablet by mouth 2 (two) times daily. 01/17/14   Campbell Richesarolyn C Hoskins, NP  triamcinolone cream (KENALOG) 0.1 % Apply 1 application topically 2 (two) times daily. Prn rash; use up to 2 weeks 01/17/14   Presley Raddlearolyn C  Hoskins, NP   BP 123/106  Pulse 94  Temp(Src) 97.8 F (36.6 C)  Resp 20  Ht 5\' 4"  (1.626 m)  Wt 155 lb (70.308 kg)  BMI 26.59 kg/m2  SpO2 98%  LMP 03/08/2014  Breastfeeding? No Physical Exam  Nursing note and vitals reviewed. Constitutional: She is oriented to person, place, and time. She appears well-developed and well-nourished. No distress.  HENT:  Head: Normocephalic and atraumatic.  Right Ear: There is swelling. Tympanic membrane is perforated.  Mouth/Throat: Uvula is midline, oropharynx is clear and moist and mucous membranes are normal.  Eyes: Conjunctivae and EOM are normal.  Neck: Neck supple. No  tracheal deviation present.  Cardiovascular: Normal rate.   Pulmonary/Chest: Effort normal. No respiratory distress.  Musculoskeletal: Normal range of motion.  Neurological: She is alert and oriented to person, place, and time.  Skin: Skin is warm and dry.  Psychiatric: She has a normal mood and affect. Her behavior is normal.    ED Course  Procedures (including critical care time)  DIAGNOSTIC STUDIES: Oxygen Saturation is 98% on room air, normal by my interpretation.    COORDINATION OF CARE: 5:56 PM-Discussed treatment plan which includes antibiotics with pt at bedside and pt agreed to plan.    MDM  23 y.o. female with acute otitis externa and TM perforation will treat with antibiotics and ear drop. She will follow up with her PCP to be sure the ear is improving and for recheck of her elevated BP. She will return here for worsening symptoms. Stable for discharge without fever.    Medication List    TAKE these medications       amoxicillin-clavulanate 875-125 MG per tablet  Commonly known as:  AUGMENTIN  Take 1 tablet by mouth 2 (two) times daily.     HYDROcodone-acetaminophen 5-325 MG per tablet  Commonly known as:  NORCO/VICODIN  Take 1 tablet by mouth every 4 (four) hours as needed.     naproxen 375 MG tablet  Commonly known as:  NAPROSYN  Take 1 tablet (375 mg total) by mouth 2 (two) times daily.     neomycin-polymyxin-hydrocortisone 3.5-10000-1 otic suspension  Commonly known as:  CORTISPORIN  Place 4 drops into the right ear 3 (three) times daily.      ASK your doctor about these medications       albuterol 108 (90 BASE) MCG/ACT inhaler  Commonly known as:  PROVENTIL HFA;VENTOLIN HFA  Inhale 2 puffs into the lungs every 6 (six) hours as needed for wheezing.     ALPRAZolam 1 MG tablet  Commonly known as:  XANAX  1/2-1 po BID prn anxiety     PARoxetine 20 MG tablet  Commonly known as:  PAXIL  Take 1 tablet (20 mg total) by mouth daily.      sulfamethoxazole-trimethoprim 800-160 MG per tablet  Commonly known as:  BACTRIM DS  Take 1 tablet by mouth 2 (two) times daily.     triamcinolone cream 0.1 %  Commonly known as:  KENALOG  Apply 1 application topically 2 (two) times daily. Prn rash; use up to 2 weeks       I personally performed the services described in this documentation, which was scribed in my presence. The recorded information has been reviewed and is accurate.     [facility] Orlene OchM Neese, [place]NP 03/19/14 [phone]

## 2014-03-18 NOTE — Discharge Instructions (Signed)
Follow up with your doctor in the next week or 2 to be sure the ear has healed. Return here as needed.

## 2014-03-18 NOTE — ED Notes (Signed)
Decreased hearing rt ear, has put vinegar, peroxide, and hot water into her ear, Now her ear is "sore"

## 2014-03-18 NOTE — ED Notes (Signed)
Pt verbalized understanding of no driving within 4 hours of taking vicodin due to med causes drowsiness  

## 2014-03-19 NOTE — ED Provider Notes (Signed)
History/physical exam/procedure(s) were performed by non-physician practitioner and as supervising physician I was immediately available for consultation/collaboration. I have reviewed all notes and am in agreement with care and plan.   Hilario Quarryanielle S Ray, MD 03/19/14 1328

## 2014-04-11 ENCOUNTER — Telehealth: Payer: Self-pay | Admitting: Family Medicine

## 2014-04-11 ENCOUNTER — Other Ambulatory Visit: Payer: Self-pay | Admitting: Nurse Practitioner

## 2014-04-11 MED ORDER — ALPRAZOLAM 1 MG PO TABS: ORAL_TABLET | ORAL | Status: DC

## 2014-04-11 NOTE — Telephone Encounter (Signed)
Will send in one refill for one month. Needs office visit.

## 2014-04-11 NOTE — Telephone Encounter (Signed)
Patient needs Rx for [facility]razolam to [facility]

## 2014-04-11 NOTE — Telephone Encounter (Signed)
Seen 01/17/14

## 2014-04-12 NOTE — Telephone Encounter (Signed)
Notified patient via VM stating we sent in the medicine.

## 2014-04-28 ENCOUNTER — Emergency Department (HOSPITAL_COMMUNITY)
Admission: EM | Admit: 2014-04-28 | Discharge: 2014-04-29 | Disposition: A | Discharge status: Home / Self Care | Payer: Medicaid Other | Attending: Emergency Medicine | Admitting: Emergency Medicine

## 2014-04-28 DIAGNOSIS — Z3202 Encounter for pregnancy test, result negative: Secondary | ICD-10-CM | POA: Insufficient documentation

## 2014-04-28 DIAGNOSIS — Z8739 Personal history of other diseases of the musculoskeletal system and connective tissue: Secondary | ICD-10-CM | POA: Insufficient documentation

## 2014-04-28 DIAGNOSIS — Z791 Long term (current) use of non-steroidal anti-inflammatories (NSAID): Secondary | ICD-10-CM | POA: Insufficient documentation

## 2014-04-28 DIAGNOSIS — Z79899 Other long term (current) drug therapy: Secondary | ICD-10-CM | POA: Insufficient documentation

## 2014-04-28 DIAGNOSIS — Z8719 Personal history of other diseases of the digestive system: Secondary | ICD-10-CM | POA: Insufficient documentation

## 2014-04-28 DIAGNOSIS — J45909 Unspecified asthma, uncomplicated: Secondary | ICD-10-CM | POA: Insufficient documentation

## 2014-04-28 DIAGNOSIS — F17200 Nicotine dependence, unspecified, uncomplicated: Secondary | ICD-10-CM | POA: Insufficient documentation

## 2014-04-28 DIAGNOSIS — Z8742 Personal history of other diseases of the female genital tract: Secondary | ICD-10-CM | POA: Insufficient documentation

## 2014-04-28 DIAGNOSIS — N760 Acute vaginitis: Secondary | ICD-10-CM

## 2014-04-28 DIAGNOSIS — F411 Generalized anxiety disorder: Secondary | ICD-10-CM | POA: Insufficient documentation

## 2014-04-28 DIAGNOSIS — Z872 Personal history of diseases of the skin and subcutaneous tissue: Secondary | ICD-10-CM | POA: Insufficient documentation

## 2014-04-28 DIAGNOSIS — M549 Dorsalgia, unspecified: Secondary | ICD-10-CM | POA: Insufficient documentation

## 2014-04-29 ENCOUNTER — Encounter (HOSPITAL_COMMUNITY): Payer: Self-pay | Admitting: Emergency Medicine

## 2014-04-29 LAB — CBC WITH DIFFERENTIAL/PLATELET
BASOS ABS: 0 10*3/uL (ref 0.0–0.1)
BASOS PCT: 0 % (ref 0–1)
EOS ABS: 0.4 10*3/uL (ref 0.0–0.7)
Eosinophils Relative: 3 % (ref 0–5)
HCT: 38.9 % (ref 36.0–46.0)
Hemoglobin: 13.3 g/dL (ref 12.0–15.0)
LYMPHS ABS: 3.3 10*3/uL (ref 0.7–4.0)
Lymphocytes Relative: 30 % (ref 12–46)
MCH: 30 pg (ref 26.0–34.0)
MCHC: 34.2 g/dL (ref 30.0–36.0)
MCV: 87.6 fL (ref 78.0–100.0)
Monocytes Absolute: 0.6 10*3/uL (ref 0.1–1.0)
Monocytes Relative: 5 % (ref 3–12)
NEUTROS PCT: 62 % (ref 43–77)
Neutro Abs: 6.9 10*3/uL (ref 1.7–7.7)
PLATELETS: 323 10*3/uL (ref 150–400)
RBC: 4.44 MIL/uL (ref 3.87–5.11)
RDW: 12.7 % (ref 11.5–15.5)
WBC: 11.2 10*3/uL — ABNORMAL HIGH (ref 4.0–10.5)

## 2014-04-29 LAB — BASIC METABOLIC PANEL
ANION GAP: 12 (ref 5–15)
BUN: 10 mg/dL (ref 6–23)
CO2: 23 mEq/L (ref 19–32)
Calcium: 8.9 mg/dL (ref 8.4–10.5)
Chloride: 104 mEq/L (ref 96–112)
Creatinine, Ser: 0.93 mg/dL (ref 0.50–1.10)
GFR, EST NON AFRICAN AMERICAN: 86 mL/min — AB (ref >90)
GLUCOSE: 102 mg/dL — AB (ref 70–99)
POTASSIUM: 4.1 meq/L (ref 3.7–5.3)
SODIUM: 139 meq/L (ref 137–147)

## 2014-04-29 LAB — PREGNANCY, URINE: PREG TEST UR: NEGATIVE

## 2014-04-29 LAB — URINALYSIS, ROUTINE W REFLEX MICROSCOPIC
Bilirubin Urine: NEGATIVE
Glucose, UA: NEGATIVE mg/dL
Hgb urine dipstick: NEGATIVE
KETONES UR: NEGATIVE mg/dL
LEUKOCYTES UA: NEGATIVE
Nitrite: NEGATIVE
PROTEIN: NEGATIVE mg/dL
Specific Gravity, Urine: <1.005 — ABNORMAL LOW (ref 1.005–1.030)
UROBILINOGEN UA: 0.2 mg/dL (ref 0.0–1.0)
pH: 6 (ref 5.0–8.0)

## 2014-04-29 LAB — WET PREP, GENITAL
CLUE CELLS WET PREP: NONE SEEN
TRICH WET PREP: NONE SEEN
WBC WET PREP: NONE SEEN
Yeast Wet Prep HPF POC: NONE SEEN

## 2014-04-29 LAB — HIV ANTIBODY (ROUTINE TESTING W REFLEX): HIV 1&2 Ab, 4th Generation: NONREACTIVE

## 2014-04-29 LAB — RPR

## 2014-04-29 MED ORDER — HYDROCODONE-ACETAMINOPHEN 5-325 MG PO TABS
1.0000 | ORAL_TABLET | Freq: Once | ORAL | Status: AC
  Administered 2014-04-29: 1 via ORAL
  Filled 2014-04-29: qty 1

## 2014-04-29 MED ORDER — METRONIDAZOLE 500 MG PO TABS
2000.0000 mg | ORAL_TABLET | Freq: Once | ORAL | Status: DC
Start: 2014-04-29 — End: 2014-07-05

## 2014-04-29 MED ORDER — AZITHROMYCIN 250 MG PO TABS
1000.0000 mg | ORAL_TABLET | Freq: Once | ORAL | Status: AC
  Administered 2014-04-29: 1000 mg via ORAL
  Filled 2014-04-29: qty 4

## 2014-04-29 NOTE — ED Provider Notes (Signed)
CSN: [id number]     Arrival date & time 04/28/14  2352 History   First MD Initiated Contact with Patient 04/29/14 0016     Chief Complaint  Patient presents with  . Back Pain    HPI Pt has noticed a foul odor to her urine.  She has been having trouble off an on since June.  Last night she started to have pain in her left flank, kidney area.  In May she was treated for a urinary tract infection.  She felt her symptoms started again after taking antibiotics for one week.  She never saw anyone again after that until tonight.  No vaginal bleeding.  No discharge right now although she has noticed some off an on. Past Medical History  Diagnosis Date  . Eczema 2008  . Asthma   . Ovarian cyst   . Asthma   . Anxiety   . GERD (gastroesophageal reflux disease)    Past Surgical History  Procedure Laterality Date  . Ovarian cyst removal    . Ovarian cyst removal  2011  . Tubal ligation     Family History  Problem Relation Age of Onset  . Diabetes Mother    History  Substance Use Topics  . Smoking status: Current Every Day Smoker -- 0.50 packs/day for 5 years    Types: Cigarettes  . Smokeless tobacco: Not on file  . Alcohol Use: No   OB History   Grav Para Term Preterm Abortions TAB SAB Ect Mult Living   2 2 1  0 0 0 0 0 0 2     Review of Systems  All other systems reviewed and are negative.     Allergies  Review of patient's allergies indicates no known allergies.  Home Medications   Prior to Admission medications   Medication Sig Start Date End Date Taking? Authorizing Provider  albuterol (PROVENTIL HFA;VENTOLIN HFA) 108 (90 BASE) MCG/ACT inhaler Inhale 2 puffs into the lungs every 6 (six) hours as needed for wheezing.   Yes Historical Provider, MD  ALPRAZolam Prudy Feeler(XANAX) 1 MG tablet 1/2-1 po BID prn anxiety 04/11/14  Yes Campbell Richesarolyn C Hoskins, NP  PARoxetine (PAXIL) 20 MG tablet Take 1 tablet (20 mg total) by mouth daily. 01/17/14  Yes Campbell Richesarolyn C Hoskins, NP  amoxicillin-clavulanate  (AUGMENTIN) 875-125 MG per tablet Take 1 tablet by mouth 2 (two) times daily. 03/18/14   Hope Orlene OchM Neese, NP  HYDROcodone-acetaminophen (NORCO/VICODIN) 5-325 MG per tablet Take 1 tablet by mouth every 4 (four) hours as needed. 03/18/14   Hope Orlene OchM Neese, NP  metroNIDAZOLE (FLAGYL) 500 MG tablet Take 4 tablets (2,000 mg total) by mouth once. 04/29/14   Md Linwood DibblesJon Nikko Quast, MD  naproxen (NAPROSYN) 375 MG tablet Take 1 tablet (375 mg total) by mouth 2 (two) times daily. 03/18/14   Hope Orlene OchM Neese, NP  neomycin-polymyxin-hydrocortisone (CORTISPORIN) 3.5-10000-1 otic suspension Place 4 drops into the right ear 3 (three) times daily. 03/18/14   Hope Orlene OchM Neese, NP  sulfamethoxazole-trimethoprim (BACTRIM DS) 800-160 MG per tablet Take 1 tablet by mouth 2 (two) times daily. 01/17/14   Campbell Richesarolyn C Hoskins, NP  triamcinolone cream (KENALOG) 0.1 % Apply 1 application topically 2 (two) times daily. Prn rash; use up to 2 weeks 01/17/14   Campbell Richesarolyn C Hoskins, NP   BP 134/91  Pulse 108  Temp(Src) 98.9 F (37.2 C) (Oral)  Resp 16  Ht 5\' 4"  (1.626 m)  Wt 150 lb (68.04 kg)  BMI 25.73 kg/m2  SpO2 100%  LMP  04/07/2014 Physical Exam  Nursing note and vitals reviewed. Constitutional: She appears well-developed and well-nourished. No distress.  HENT:  Head: Normocephalic and atraumatic.  Right Ear: External ear normal.  Left Ear: External ear normal.  Eyes: Conjunctivae are normal. Right eye exhibits no discharge. Left eye exhibits no discharge. No scleral icterus.  Neck: Neck supple. No tracheal deviation present.  Cardiovascular: Normal rate, regular rhythm and intact distal pulses.   Pulmonary/Chest: Effort normal and breath sounds normal. No stridor. No respiratory distress. She has no wheezes. She has no rales.  Abdominal: Soft. Bowel sounds are normal. She exhibits no distension. There is no tenderness. There is CVA tenderness (left sided). There is no rebound and no guarding.  Genitourinary: Uterus is not enlarged. Cervix exhibits  discharge (small amount of white discharge). Cervix exhibits no motion tenderness. Right adnexum displays no mass and no tenderness. Left adnexum displays no mass and no tenderness. No erythema or bleeding around the vagina. No foreign body around the vagina. Vaginal discharge found.  Musculoskeletal: She exhibits no edema and no tenderness.  Neurological: She is alert. She has normal strength. No cranial nerve deficit (no facial droop, extraocular movements intact, no slurred speech) or sensory deficit. She exhibits normal muscle tone. She displays no seizure activity. Coordination normal.  Skin: Skin is warm and dry. No rash noted.  Psychiatric: She has a normal mood and affect.    ED Course  Procedures (including critical care time) Labs Review Labs Reviewed  CBC WITH DIFFERENTIAL - Abnormal; Notable for the following:    WBC 11.2 (*)    All other components within normal limits  BASIC METABOLIC PANEL - Abnormal; Notable for the following:    Glucose, Bld 102 (*)    GFR calc non Af Amer 86 (*)    All other components within normal limits  URINALYSIS, ROUTINE W REFLEX MICROSCOPIC - Abnormal; Notable for the following:    Specific Gravity, Urine <1.005 (*)    All other components within normal limits  WET PREP, GENITAL  GC/CHLAMYDIA PROBE AMP  PREGNANCY, URINE  RPR  HIV ANTIBODY (ROUTINE TESTING)    Medications  azithromycin (ZITHROMAX) tablet 1,000 mg (not administered)  HYDROcodone-acetaminophen (NORCO/VICODIN) 5-325 MG per tablet 1 tablet (1 tablet Oral Given 04/29/14 0119)    MDM   Final diagnoses:  Vaginitis   UA does not suggest a UTI.  Pt has noticed some urine and vaginal odor.  Will treat for vaginitis.  Rx flagyl. Will add zithromax for possible chlamydial urethritis.  GC and Chlam culture sent off.  Follow up with PCP     Md Linwood Dibbles, MD 04/29/14 [phone]

## 2014-04-29 NOTE — Discharge Instructions (Signed)
Bacterial Vaginosis Bacterial vaginosis is an infection of the vagina. It happens when too many of certain germs (bacteria) grow in the vagina. HOME CARE  Take your medicine as told by your doctor.  Finish your medicine even if you start to feel better.  Do not have sex until you finish your medicine and are better.  Tell your sex partner that you have an infection. They should see their doctor for treatment.  Practice safe sex. Use condoms. Have only one sex partner. GET HELP IF:  You are not getting better after 3 days of treatment.  You have more grey fluid (discharge) coming from your vagina than before.  You have more pain than before.  You have a fever. MAKE SURE YOU:   Understand these instructions.  Will watch your condition.  Will get help right away if you are not doing well or get worse. Document Released: 06/15/2008 Document Revised: 06/27/2013 Document Reviewed: 04/18/2013 Exit[employer] Patient Information 2015 [employer]. This information is not intended to replace advice given to you by your health care provider. Make sure you discuss any questions you have with your health care provider.  

## 2014-04-29 NOTE — ED Notes (Signed)
Pt reports chronic uti's in the past, states her usual symptoms are lower back pain and foul smelling urine.  Pt states she started hurting in her back last night.

## 2014-04-29 NOTE — ED Notes (Signed)
Pt alert & oriented x4, stable gait. Patient given discharge instructions, paperwork & prescription(s). Patient  instructed to stop at the registration desk to finish any additional paperwork. Patient verbalized understanding. Pt left department w/ no further questions. 

## 2014-04-30 LAB — GC/CHLAMYDIA PROBE AMP
CT Probe RNA: NEGATIVE
GC Probe RNA: NEGATIVE

## 2014-07-05 ENCOUNTER — Ambulatory Visit (INDEPENDENT_AMBULATORY_CARE_PROVIDER_SITE_OTHER): Payer: Medicaid Other | Admitting: Nurse Practitioner

## 2014-07-05 ENCOUNTER — Encounter: Payer: Self-pay | Admitting: Nurse Practitioner

## 2014-07-05 VITALS — BP 128/74 | Ht 63.0 in | Wt 154.0 lb

## 2014-07-05 DIAGNOSIS — N943 Premenstrual tension syndrome: Secondary | ICD-10-CM

## 2014-07-05 DIAGNOSIS — G43829 Menstrual migraine, not intractable, without status migrainosus: Secondary | ICD-10-CM

## 2014-07-05 DIAGNOSIS — F419 Anxiety disorder, unspecified: Secondary | ICD-10-CM

## 2014-07-05 MED ORDER — PAROXETINE HCL 20 MG PO TABS: 20.0000 mg | ORAL_TABLET | Freq: Every day | ORAL | Status: DC

## 2014-07-05 MED ORDER — ALPRAZOLAM 1 MG PO TABS: ORAL_TABLET | ORAL | Status: DC

## 2014-07-05 MED ORDER — NORGESTIMATE-ETH ESTRADIOL 0.25-35 MG-MCG PO TABS: 1.0000 | ORAL_TABLET | Freq: Every day | ORAL | Status: DC

## 2014-07-10 ENCOUNTER — Encounter: Payer: Self-pay | Admitting: Nurse Practitioner

## 2014-07-10 DIAGNOSIS — G43829 Menstrual migraine, not intractable, without status migrainosus: Secondary | ICD-10-CM | POA: Insufficient documentation

## 2014-07-10 NOTE — Progress Notes (Signed)
Subjective:  Presents for several issues. Having a headache starting about 5 days before cycle, lasting most of the cycle. Describes as a "squeezing" pain 7/10 on a pain scale. Nausea, no vomiting. Photosensitivity and phonophobia. Slight blurred vision. Only occurs with cycle. Has had a tubal ligation. Cycle regular with some spotting in between. Married, same sexual partner. No relief with Aleve, Hydrocodone. Has stress being a mother at home. Paxil and xanax working well for anxiety. Limits xanax to no more than 2 per day. Uninsured at this time, cannot afford physical.   Objective:   BP 128/74  Ht 5\' 3"  (1.6 m)  Wt 154 lb (69.854 kg)  BMI 27.29 kg/m2 NAD. Alert, oriented. Lungs clear. Heart RRR.   Assessment:  Problem List Items Addressed This Visit     Cardiovascular and Mediastinum   Menstrual migraine without status migrainosus, not intractable   Relevant Medications      PARoxetine (PAXIL) tablet     Other   Anxiety - Primary   Relevant Medications      ALPRAZolam (XANAX) tablet      PARoxetine (PAXIL) tablet       Plan:  Meds ordered this encounter  Medications  . ALPRAZolam (XANAX) 1 MG tablet    Sig: 1/2-1 po BID prn anxiety    Dispense:  60 tablet    Refill:  5    May refill monthly    Order Specific Question:  Supervising Provider    Answer:  Merlyn AlbertLUKING, WILLIAM S [[id number]]  . PARoxetine (PAXIL) 20 MG tablet    Sig: Take 1 tablet (20 mg total) by mouth daily.    Dispense:  90 tablet    Refill:  1    Order Specific Question:  Supervising Provider    Answer:  Merlyn AlbertLUKING, WILLIAM S [[id number]]  . norgestimate-ethinyl estradiol (ORTHO-CYCLEN,SPRINTEC,PREVIFEM) 0.25-35 MG-MCG tablet    Sig: Take 1 tablet by mouth daily.    Dispense:  2 Package    Refill:  6    Order Specific Question:  Supervising Provider    Answer:  Merlyn AlbertLUKING, WILLIAM S [[id number]]   Discussed options for migraines including: continuous oc's or pills with estrogen patch at time of cycle. Abortive therapy such as  imitrex or midrin. Daily med for migraines such as topamax or antihypertensive.  Trial of continuous oc's. Call back if headaches worsen or persist.  Return in about 6 months (around 01/04/2015).

## 2014-07-16 ENCOUNTER — Encounter (HOSPITAL_COMMUNITY): Payer: Self-pay | Admitting: Emergency Medicine

## 2014-07-16 ENCOUNTER — Emergency Department (HOSPITAL_COMMUNITY)
Admission: EM | Admit: 2014-07-16 | Discharge: 2014-07-16 | Disposition: A | Discharge status: Home / Self Care | Payer: Medicaid Other | Attending: Emergency Medicine | Admitting: Emergency Medicine

## 2014-07-16 DIAGNOSIS — Y929 Unspecified place or not applicable: Secondary | ICD-10-CM | POA: Insufficient documentation

## 2014-07-16 DIAGNOSIS — Z8719 Personal history of other diseases of the digestive system: Secondary | ICD-10-CM | POA: Insufficient documentation

## 2014-07-16 DIAGNOSIS — T148XXA Other injury of unspecified body region, initial encounter: Secondary | ICD-10-CM

## 2014-07-16 DIAGNOSIS — W458XXA Other foreign body or object entering through skin, initial encounter: Secondary | ICD-10-CM | POA: Insufficient documentation

## 2014-07-16 DIAGNOSIS — Z7952 Long term (current) use of systemic steroids: Secondary | ICD-10-CM | POA: Insufficient documentation

## 2014-07-16 DIAGNOSIS — Y9389 Activity, other specified: Secondary | ICD-10-CM | POA: Insufficient documentation

## 2014-07-16 DIAGNOSIS — S71131A Puncture wound without foreign body, right thigh, initial encounter: Secondary | ICD-10-CM | POA: Insufficient documentation

## 2014-07-16 DIAGNOSIS — J45909 Unspecified asthma, uncomplicated: Secondary | ICD-10-CM | POA: Insufficient documentation

## 2014-07-16 DIAGNOSIS — L03115 Cellulitis of right lower limb: Secondary | ICD-10-CM

## 2014-07-16 DIAGNOSIS — Z8742 Personal history of other diseases of the female genital tract: Secondary | ICD-10-CM | POA: Insufficient documentation

## 2014-07-16 DIAGNOSIS — Z79899 Other long term (current) drug therapy: Secondary | ICD-10-CM | POA: Insufficient documentation

## 2014-07-16 DIAGNOSIS — F419 Anxiety disorder, unspecified: Secondary | ICD-10-CM | POA: Insufficient documentation

## 2014-07-16 DIAGNOSIS — Z720 Tobacco use: Secondary | ICD-10-CM | POA: Insufficient documentation

## 2014-07-16 MED ORDER — OXYCODONE-ACETAMINOPHEN 5-325 MG PO TABS
2.0000 | ORAL_TABLET | Freq: Once | ORAL | Status: AC
  Administered 2014-07-16: 2 via ORAL
  Filled 2014-07-16: qty 2

## 2014-07-16 MED ORDER — IBUPROFEN 400 MG PO TABS
600.0000 mg | ORAL_TABLET | Freq: Once | ORAL | Status: AC
  Administered 2014-07-16: 600 mg via ORAL
  Filled 2014-07-16: qty 2

## 2014-07-16 MED ORDER — CLINDAMYCIN HCL 150 MG PO CAPS
600.0000 mg | ORAL_CAPSULE | Freq: Once | ORAL | Status: AC
  Administered 2014-07-16: 600 mg via ORAL
  Filled 2014-07-16: qty 4

## 2014-07-16 MED ORDER — OXYCODONE-ACETAMINOPHEN 5-325 MG PO TABS: 1.0000 | ORAL_TABLET | ORAL | Status: DC | PRN

## 2014-07-16 MED ORDER — CLINDAMYCIN HCL 150 MG PO CAPS: 300.0000 mg | ORAL_CAPSULE | Freq: Three times a day (TID) | ORAL | Status: DC

## 2014-07-16 NOTE — ED Notes (Addendum)
MD at the bedside with u/s

## 2014-07-16 NOTE — ED Notes (Signed)
Patient given discharge instruction, verbalized understand. Patient ambulatory out of the department.  

## 2014-07-16 NOTE — Discharge Instructions (Signed)

## 2014-07-16 NOTE — ED Notes (Signed)
Patient c/o right thigh pain. Per patient fell on a piece of wood Saturday with a puncture wound to right inner thigh. Red and warm to touch. Patient reports yellow-green drainage.

## 2014-07-16 NOTE — ED Provider Notes (Signed)
CSN: [id number]     Arrival date & time 07/16/14  1246 History  This chart was scribed for Raeford RazorStephen Handsome Anglin, MD by Annye AsaAnna Dorsett, ED Scribe. This patient was seen in room APA19/[facility]19 and the patient's care was started at 3:49 PM.    Chief Complaint  Patient presents with  . Puncture Wound   The history is provided by the patient. No language interpreter was used.    HPI Comments: Karen Shields is a 23 y.o. female who presents to the Emergency Department complaining of right thigh pain. Patient fell on a piece of wood 4 days PTA; she reports a puncture wound to the area that is red and warm to the touch. She explains that after a shower last night she saw pus around the wound. She reports occasional chills. She denies a fever. She denies any significant medical problems.    Past Medical History  Diagnosis Date  . Eczema 2008  . Asthma   . Ovarian cyst   . Asthma   . Anxiety   . GERD (gastroesophageal reflux disease)    Past Surgical History  Procedure Laterality Date  . Ovarian cyst removal    . Ovarian cyst removal  2011  . Tubal ligation     Family History  Problem Relation Age of Onset  . Diabetes Mother    History  Substance Use Topics  . Smoking status: Current Every Day Smoker -- 0.50 packs/day for 5 years    Types: Cigarettes  . Smokeless tobacco: Never Used  . Alcohol Use: No   OB History   Grav Para Term Preterm Abortions TAB SAB Ect Mult Living   2 2 1  0 0 0 0 0 0 2     Review of Systems  Skin: Positive for wound.  All other systems reviewed and are negative.    Allergies  Review of patient's allergies indicates no known allergies.  Home Medications   Prior to Admission medications   Medication Sig Start Date End Date Taking? Authorizing Provider  albuterol (PROVENTIL HFA;VENTOLIN HFA) 108 (90 BASE) MCG/ACT inhaler Inhale 2 puffs into the lungs every 6 (six) hours as needed for wheezing.   Yes Historical Provider, MD  ALPRAZolam Prudy Feeler(XANAX) 1 MG tablet  Take 0.5-1 mg by mouth 2 (two) times daily as needed for anxiety.   Yes Historical Provider, MD  norgestimate-ethinyl estradiol (ORTHO-CYCLEN,SPRINTEC,PREVIFEM) 0.25-35 MG-MCG tablet Take 1 tablet by mouth daily. 07/05/14  Yes Campbell Richesarolyn C Hoskins, NP  PARoxetine (PAXIL) 20 MG tablet Take 1 tablet (20 mg total) by mouth daily. 07/05/14  Yes Campbell Richesarolyn C Hoskins, NP  triamcinolone cream (KENALOG) 0.1 % Apply 1 application topically 2 (two) times daily. Prn rash; use up to 2 weeks 01/17/14  Yes Campbell Richesarolyn C Hoskins, NP   BP 124/76  Pulse 88  Resp 14  SpO2 100%  LMP 06/25/2014 Physical Exam  Nursing note and vitals reviewed. Constitutional: She is oriented to person, place, and time. She appears well-developed and well-nourished.  HENT:  Head: Normocephalic and atraumatic.  Neck: No tracheal deviation present.  Cardiovascular: Normal rate.   Pulmonary/Chest: Effort normal and breath sounds normal. No respiratory distress. She has no wheezes. She has no rales.  Neurological: She is alert and oriented to person, place, and time.  Skin: Skin is warm and dry.  Small scabbed wound to mid aspect of posterior right thigh with surrounding erythema, tenderness; increased swelling consistent with cellulitis   Psychiatric: She has a normal mood and affect. Her behavior  is normal.    ED Course  Procedures   DIAGNOSTIC STUDIES: Oxygen Saturation is 100% on RA, normal by my interpretation.    COORDINATION OF CARE: 3:50 PM Discussed treatment plan with pt at bedside and pt agreed to plan.   Labs Review Labs Reviewed - No data to display  Imaging Review No results found.   EKG Interpretation None      MDM   Final diagnoses:  Puncture wound  Cellulitis of right thigh    23 year old female with cellulitis to her right thigh most likely related to small wound she sustained while falling on a piece of wood. She reports her tetanus is current. Bedside ultrasound did not show any significant  underlying fluid collection or foreign body. Plan ST depression medication. Antibiotics. Continued wound care and return precautions were discussed.  I personally preformed the services scribed in my presence. The recorded information has been reviewed is accurate. Raeford RazorStephen Tosca Pletz, MD.      Raeford RazorStephen Jarica Plass, MD 07/17/14 1130

## 2014-07-22 ENCOUNTER — Encounter (HOSPITAL_COMMUNITY): Payer: Self-pay | Admitting: Emergency Medicine

## 2014-08-22 ENCOUNTER — Encounter (HOSPITAL_COMMUNITY): Payer: Self-pay | Admitting: Emergency Medicine

## 2014-08-22 ENCOUNTER — Emergency Department (HOSPITAL_COMMUNITY)
Admission: EM | Admit: 2014-08-22 | Discharge: 2014-08-23 | Disposition: A | Discharge status: Home / Self Care | Payer: Medicaid Other | Attending: Emergency Medicine | Admitting: Emergency Medicine

## 2014-08-22 DIAGNOSIS — Z8719 Personal history of other diseases of the digestive system: Secondary | ICD-10-CM | POA: Insufficient documentation

## 2014-08-22 DIAGNOSIS — Z79899 Other long term (current) drug therapy: Secondary | ICD-10-CM | POA: Insufficient documentation

## 2014-08-22 DIAGNOSIS — Z720 Tobacco use: Secondary | ICD-10-CM | POA: Insufficient documentation

## 2014-08-22 DIAGNOSIS — J45909 Unspecified asthma, uncomplicated: Secondary | ICD-10-CM | POA: Insufficient documentation

## 2014-08-22 DIAGNOSIS — F419 Anxiety disorder, unspecified: Secondary | ICD-10-CM | POA: Insufficient documentation

## 2014-08-22 DIAGNOSIS — R112 Nausea with vomiting, unspecified: Secondary | ICD-10-CM

## 2014-08-22 DIAGNOSIS — Z8742 Personal history of other diseases of the female genital tract: Secondary | ICD-10-CM | POA: Insufficient documentation

## 2014-08-22 DIAGNOSIS — Z3202 Encounter for pregnancy test, result negative: Secondary | ICD-10-CM | POA: Insufficient documentation

## 2014-08-22 DIAGNOSIS — Z872 Personal history of diseases of the skin and subcutaneous tissue: Secondary | ICD-10-CM | POA: Insufficient documentation

## 2014-08-22 LAB — COMPREHENSIVE METABOLIC PANEL
ALBUMIN: 4.9 g/dL (ref 3.5–5.2)
ALT: 25 U/L (ref 0–35)
ANION GAP: 15 (ref 5–15)
AST: 20 U/L (ref 0–37)
Alkaline Phosphatase: 106 U/L (ref 39–117)
BILIRUBIN TOTAL: 0.3 mg/dL (ref 0.3–1.2)
BUN: 10 mg/dL (ref 6–23)
CO2: 24 mEq/L (ref 19–32)
CREATININE: 0.59 mg/dL (ref 0.50–1.10)
Calcium: 10.1 mg/dL (ref 8.4–10.5)
Chloride: 100 mEq/L (ref 96–112)
GFR calc non Af Amer: >90 mL/min (ref >90)
GLUCOSE: 104 mg/dL — AB (ref 70–99)
Potassium: 3.9 mEq/L (ref 3.7–5.3)
Sodium: 139 mEq/L (ref 137–147)
Total Protein: 8.5 g/dL — ABNORMAL HIGH (ref 6.0–8.3)

## 2014-08-22 LAB — CBC WITH DIFFERENTIAL/PLATELET
BASOS PCT: 0 % (ref 0–1)
Basophils Absolute: 0 10*3/uL (ref 0.0–0.1)
EOS ABS: 0 10*3/uL (ref 0.0–0.7)
Eosinophils Relative: 0 % (ref 0–5)
HEMATOCRIT: 42.1 % (ref 36.0–46.0)
HEMOGLOBIN: 14.5 g/dL (ref 12.0–15.0)
LYMPHS ABS: 2.3 10*3/uL (ref 0.7–4.0)
Lymphocytes Relative: 13 % (ref 12–46)
MCH: 29.8 pg (ref 26.0–34.0)
MCHC: 34.4 g/dL (ref 30.0–36.0)
MCV: 86.4 fL (ref 78.0–100.0)
MONOS PCT: 4 % (ref 3–12)
Monocytes Absolute: 0.8 10*3/uL (ref 0.1–1.0)
Neutro Abs: 15.6 10*3/uL — ABNORMAL HIGH (ref 1.7–7.7)
Neutrophils Relative %: 83 % — ABNORMAL HIGH (ref 43–77)
Platelets: 396 10*3/uL (ref 150–400)
RBC: 4.87 MIL/uL (ref 3.87–5.11)
RDW: 12.5 % (ref 11.5–15.5)
WBC: 18.8 10*3/uL — ABNORMAL HIGH (ref 4.0–10.5)

## 2014-08-22 LAB — URINALYSIS, ROUTINE W REFLEX MICROSCOPIC
BILIRUBIN URINE: NEGATIVE
GLUCOSE, UA: NEGATIVE mg/dL
Hgb urine dipstick: NEGATIVE
Ketones, ur: NEGATIVE mg/dL
Leukocytes, UA: NEGATIVE
Nitrite: NEGATIVE
PH: 6.5 (ref 5.0–8.0)
Protein, ur: NEGATIVE mg/dL
Specific Gravity, Urine: 1.01 (ref 1.005–1.030)
Urobilinogen, UA: 0.2 mg/dL (ref 0.0–1.0)

## 2014-08-22 LAB — POC URINE PREG, ED: PREG TEST UR: NEGATIVE

## 2014-08-22 MED ORDER — ONDANSETRON HCL 4 MG/2ML IJ SOLN
INTRAMUSCULAR | Status: AC
  Filled 2014-08-22: qty 2

## 2014-08-22 MED ORDER — SODIUM CHLORIDE 0.9 % IV BOLUS (SEPSIS)
1000.0000 mL | Freq: Once | INTRAVENOUS | Status: AC
  Administered 2014-08-22: 1000 mL via INTRAVENOUS

## 2014-08-22 MED ORDER — FAMOTIDINE IN NACL 20-0.9 MG/50ML-% IV SOLN
20.0000 mg | Freq: Once | INTRAVENOUS | Status: AC
  Administered 2014-08-22: 20 mg via INTRAVENOUS

## 2014-08-22 MED ORDER — FAMOTIDINE IN NACL 20-0.9 MG/50ML-% IV SOLN
INTRAVENOUS | Status: AC
  Filled 2014-08-22: qty 50

## 2014-08-22 MED ORDER — ONDANSETRON HCL 4 MG/2ML IJ SOLN
4.0000 mg | Freq: Once | INTRAMUSCULAR | Status: AC
  Administered 2014-08-22: 4 mg via INTRAVENOUS

## 2014-08-22 NOTE — ED Notes (Signed)
Pt c/o vomiting since 1030 this am.

## 2014-08-22 NOTE — ED Notes (Signed)
Pt given ginger ale to sip 

## 2014-08-22 NOTE — ED Provider Notes (Signed)
CSN: [id number]     Arrival date & time 08/22/14  2122 History   First MD Initiated Contact with Patient 08/22/14 2154     Chief Complaint  Patient presents with  . Emesis     (Consider location/radiation/quality/duration/timing/severity/associated sxs/prior Treatment) Patient is a 23 y.o. female presenting with vomiting. The history is provided by the patient.  Emesis Severity:  Moderate Duration:  10 hours Timing:  Intermittent Quality:  Stomach contents Progression:  Worsening Chronicity:  New Relieved by:  None tried Associated symptoms: chills    Karen Shields is a 23 y.o. female who presents to the ED with nausea and vomiting that started earlier today. She states that she has vomited every 30 minutes throughout the day. She last ate early this morning. She has tried to take liquids but vomits after. She ate chicken salad but her daughter did also and she is not sick.   Past Medical History  Diagnosis Date  . Eczema 2008  . Asthma   . Ovarian cyst   . Asthma   . Anxiety   . GERD (gastroesophageal reflux disease)    Past Surgical History  Procedure Laterality Date  . Ovarian cyst removal    . Ovarian cyst removal  2011  . Tubal ligation     Family History  Problem Relation Age of Onset  . Diabetes Mother    History  Substance Use Topics  . Smoking status: Current Every Day Smoker -- 0.50 packs/day for 5 years    Types: Cigarettes  . Smokeless tobacco: Never Used  . Alcohol Use: No   OB History    Gravida Para Term Preterm AB TAB SAB Ectopic Multiple Living   2 2 1  0 0 0 0 0 0 2     Review of Systems  Constitutional: Positive for chills. Fever: ?  Gastrointestinal: Positive for nausea and vomiting.  Genitourinary: Positive for urgency. Negative for dysuria.  Neurological: Weakness: generalized   all other systems negative    Allergies  Review of patient's allergies indicates no known allergies.  Home Medications   Prior to Admission  medications   Medication Sig Start Date End Date Taking? Authorizing Provider  albuterol (PROVENTIL HFA;VENTOLIN HFA) 108 (90 BASE) MCG/ACT inhaler Inhale 2 puffs into the lungs every 6 (six) hours as needed for wheezing.   Yes Historical Provider, MD  ALPRAZolam Prudy Feeler) 1 MG tablet Take 0.5-1 mg by mouth 2 (two) times daily as needed for anxiety.   Yes Historical Provider, MD  clindamycin (CLEOCIN) 150 MG capsule Take 2 capsules (300 mg total) by mouth 3 (three) times daily. Patient not taking: Reported on 08/22/2014 07/16/14   Raeford Razor, MD  norgestimate-ethinyl estradiol (ORTHO-CYCLEN,SPRINTEC,PREVIFEM) 0.25-35 MG-MCG tablet Take 1 tablet by mouth daily. Patient not taking: Reported on 08/22/2014 07/05/14   Campbell Riches, NP  oxyCODONE-acetaminophen (PERCOCET/ROXICET) 5-325 MG per tablet Take 1 tablet by mouth every 4 (four) hours as needed for severe pain. Patient not taking: Reported on 08/22/2014 07/16/14   Raeford Razor, MD  PARoxetine (PAXIL) 20 MG tablet Take 1 tablet (20 mg total) by mouth daily. Patient not taking: Reported on 08/22/2014 07/05/14   Campbell Riches, NP   BP 116/88 mmHg  Pulse 116  Temp(Src) 98.2 F (36.8 C) (Oral)  Resp 18  Ht 5\' 4"  (1.626 m)  Wt 156 lb 1.6 oz (70.806 kg)  BMI 26.78 kg/m2  SpO2 100%  LMP 07/28/2014 Physical Exam  Constitutional: She is oriented to person, place, and  time. She appears well-developed and well-nourished. No distress.  HENT:  Head: Normocephalic.  Mouth/Throat: Uvula is midline, oropharynx is clear and moist and mucous membranes are normal.  Eyes: EOM are normal.  Neck: Neck supple.  Cardiovascular: Tachycardia present.   Pulmonary/Chest: Effort normal.  Abdominal: Soft. Bowel sounds are normal. There is tenderness in the epigastric area.  Musculoskeletal: Normal range of motion.  Neurological: She is alert and oriented to person, place, and time. No cranial nerve deficit.  Skin: Skin is warm and dry.  Psychiatric: She  has a normal mood and affect. Her behavior is normal.  Nursing note and vitals reviewed.   ED Course  Procedures  IV NS, Zofran, Pepcid  Results for orders placed or performed during the hospital encounter of 08/22/14 (from the past 24 hour(s))  Urinalysis, Routine w reflex microscopic     Status: None   Collection Time: 08/22/14  9:58 PM  Result Value Ref Range   Color, Urine YELLOW YELLOW   APPearance CLEAR CLEAR   Specific Gravity, Urine 1.010 1.005 - 1.030   pH 6.5 5.0 - 8.0   Glucose, UA NEGATIVE NEGATIVE mg/dL   Hgb urine dipstick NEGATIVE NEGATIVE   Bilirubin Urine NEGATIVE NEGATIVE   Ketones, ur NEGATIVE NEGATIVE mg/dL   Protein, ur NEGATIVE NEGATIVE mg/dL   Urobilinogen, UA 0.2 0.0 - 1.0 mg/dL   Nitrite NEGATIVE NEGATIVE   Leukocytes, UA NEGATIVE NEGATIVE  POC urine preg, ED ([facility])     Status: None   Collection Time: 08/22/14 10:29 PM  Result Value Ref Range   Preg Test, Ur NEGATIVE NEGATIVE  CBC with Differential     Status: Abnormal   Collection Time: 08/22/14 10:33 PM  Result Value Ref Range   WBC 18.8 (H) 4.0 - 10.5 K/uL   RBC 4.87 3.87 - 5.11 MIL/uL   Hemoglobin 14.5 12.0 - 15.0 g/dL   HCT 40.942.1 81.136.0 - 91.446.0 %   MCV 86.4 78.0 - 100.0 fL   MCH 29.8 26.0 - 34.0 pg   MCHC 34.4 30.0 - 36.0 g/dL   RDW 78.212.5 95.611.5 - 21.315.5 %   Platelets 396 150 - 400 K/uL   Neutrophils Relative % 83 (H) 43 - 77 %   Neutro Abs 15.6 (H) 1.7 - 7.7 K/uL   Lymphocytes Relative 13 12 - 46 %   Lymphs Abs 2.3 0.7 - 4.0 K/uL   Monocytes Relative 4 3 - 12 %   Monocytes Absolute 0.8 0.1 - 1.0 K/uL   Eosinophils Relative 0 0 - 5 %   Eosinophils Absolute 0.0 0.0 - 0.7 K/uL   Basophils Relative 0 0 - 1 %   Basophils Absolute 0.0 0.0 - 0.1 K/uL  Comprehensive metabolic panel     Status: Abnormal   Collection Time: 08/22/14 10:33 PM  Result Value Ref Range   Sodium 139 137 - 147 mEq/L   Potassium 3.9 3.7 - 5.3 mEq/L   Chloride 100 96 - 112 mEq/L   CO2 24 19 - 32 mEq/L   Glucose, Bld  104 (H) 70 - 99 mg/dL   BUN 10 6 - 23 mg/dL   Creatinine, Ser 0.860.59 0.50 - 1.10 mg/dL   Calcium 57.810.1 8.4 - 46.910.5 mg/dL   Total Protein 8.5 (H) 6.0 - 8.3 g/dL   Albumin 4.9 3.5 - 5.2 g/dL   AST 20 0 - 37 U/L   ALT 25 0 - 35 U/L   Alkaline Phosphatase 106 39 - 117 U/L  Total Bilirubin 0.3 0.3 - 1.2 mg/dL   GFR calc non Af Amer >90 >90 mL/min   GFR calc Af Amer >90 >90 mL/min   Anion gap 15 5 - 15    Dr. Bebe ShaggyWickline in to examine the patient. She is feeling better and taking PO fluids without difficulty.  MDM  23 y.o. female with n/v that started earlier today. Stable for discharge without lower abdominal pain and no n/v at this time. Detailed instructions given to the patient by Dr. Bebe ShaggyWickline regarding abdominal pain and appendicitis and signs and symptoms that she would need to return for.     Medication List    TAKE these medications        promethazine 12.5 MG tablet  Commonly known as:  PHENERGAN  Take 1 tablet (12.5 mg total) by mouth every 6 (six) hours as needed for nausea or vomiting.     ranitidine 150 MG tablet  Commonly known as:  ZANTAC  Take 1 tablet (150 mg total) by mouth 2 (two) times daily.      ASK your doctor about these medications        albuterol 108 (90 BASE) MCG/ACT inhaler  Commonly known as:  PROVENTIL HFA;VENTOLIN HFA  Inhale 2 puffs into the lungs every 6 (six) hours as needed for wheezing.     ALPRAZolam 1 MG tablet  Commonly known as:  XANAX  Take 0.5-1 mg by mouth 2 (two) times daily as needed for anxiety.     clindamycin 150 MG capsule  Commonly known as:  CLEOCIN  Take 2 capsules (300 mg total) by mouth 3 (three) times daily.     norgestimate-ethinyl estradiol 0.25-35 MG-MCG tablet  Commonly known as:  ORTHO-CYCLEN,SPRINTEC,PREVIFEM  Take 1 tablet by mouth daily.     oxyCODONE-acetaminophen 5-325 MG per tablet  Commonly known as:  PERCOCET/ROXICET  Take 1 tablet by mouth every 4 (four) hours as needed for severe pain.     PARoxetine 20  MG tablet  Commonly known as:  PAXIL  Take 1 tablet (20 mg total) by mouth daily.            [address]Hope LafontaineM Neese, NP 08/23/14 [id number]  Joya Gaskinsonald W Wickline, MD 08/23/14 [phone]

## 2014-08-23 MED ORDER — RANITIDINE HCL 150 MG PO TABS: 150.0000 mg | ORAL_TABLET | Freq: Two times a day (BID) | ORAL | Status: DC

## 2014-08-23 MED ORDER — PROMETHAZINE HCL 12.5 MG PO TABS: 12.5000 mg | ORAL_TABLET | Freq: Four times a day (QID) | ORAL | Status: DC | PRN

## 2014-08-23 NOTE — Discharge Instructions (Signed)

## 2014-08-23 NOTE — ED Notes (Signed)
Pt verbalized discharge instructions, but e sign pad is not working at this time.

## 2014-08-23 NOTE — ED Provider Notes (Signed)
Patient seen/examined in the Emergency Department in conjunction with Midlevel Provider Neese Patient reports vomiting Exam : awake/alert, drinking ginger ale, no focal RLQ tenderness Plan: appropriate for d/c home.  We discussed strict return precautions including abdominal pain that migrates to RLQ, fever >100.28F with repetitive vomiting over next 8-12 hours   Joya Gaskinsonald W Fidelis Loth, MD 08/23/14 0003

## 2014-09-20 DIAGNOSIS — Z8614 Personal history of Methicillin resistant Staphylococcus aureus infection: Secondary | ICD-10-CM

## 2014-09-20 HISTORY — DX: Personal history of Methicillin resistant Staphylococcus aureus infection: Z86.14

## 2014-09-28 ENCOUNTER — Encounter (HOSPITAL_COMMUNITY): Payer: Self-pay | Admitting: Emergency Medicine

## 2014-09-28 ENCOUNTER — Emergency Department (HOSPITAL_COMMUNITY)
Admission: EM | Admit: 2014-09-28 | Discharge: 2014-09-28 | Disposition: A | Discharge status: Home / Self Care | Payer: 59 | Attending: Emergency Medicine | Admitting: Emergency Medicine

## 2014-09-28 DIAGNOSIS — L0291 Cutaneous abscess, unspecified: Secondary | ICD-10-CM

## 2014-09-28 DIAGNOSIS — F419 Anxiety disorder, unspecified: Secondary | ICD-10-CM | POA: Diagnosis not present

## 2014-09-28 DIAGNOSIS — J45909 Unspecified asthma, uncomplicated: Secondary | ICD-10-CM | POA: Diagnosis not present

## 2014-09-28 DIAGNOSIS — L02213 Cutaneous abscess of chest wall: Secondary | ICD-10-CM | POA: Insufficient documentation

## 2014-09-28 DIAGNOSIS — Z8719 Personal history of other diseases of the digestive system: Secondary | ICD-10-CM | POA: Diagnosis not present

## 2014-09-28 DIAGNOSIS — Z792 Long term (current) use of antibiotics: Secondary | ICD-10-CM | POA: Insufficient documentation

## 2014-09-28 DIAGNOSIS — Z79899 Other long term (current) drug therapy: Secondary | ICD-10-CM | POA: Insufficient documentation

## 2014-09-28 MED ORDER — HYDROCODONE-ACETAMINOPHEN 5-325 MG PO TABS: 1.0000 | ORAL_TABLET | ORAL | Status: DC | PRN

## 2014-09-28 MED ORDER — LIDOCAINE HCL (PF) 1 % IJ SOLN
5.0000 mL | Freq: Once | INTRAMUSCULAR | Status: AC
  Administered 2014-09-28: 17:00:00

## 2014-09-28 MED ORDER — SULFAMETHOXAZOLE-TRIMETHOPRIM 800-160 MG PO TABS: 1.0000 | ORAL_TABLET | Freq: Two times a day (BID) | ORAL | Status: DC

## 2014-09-28 MED ORDER — LIDOCAINE HCL (PF) 1 % IJ SOLN
INTRAMUSCULAR | Status: AC
  Filled 2014-09-28: qty 5

## 2014-09-28 NOTE — Discharge Instructions (Signed)

## 2014-09-28 NOTE — ED Notes (Signed)
Abscess noted to right sided upper abdomen x7 days. PT states some drainage from area today in the shower. PT denies any fevers.

## 2014-09-28 NOTE — ED Provider Notes (Signed)
CSN: [id number]     Arrival date & time 09/28/14  1430 History   First MD Initiated Contact with Patient 09/28/14 1621     Chief Complaint  Patient presents with  . Abscess     (Consider location/radiation/quality/duration/timing/severity/associated sxs/prior Treatment) Patient is a 24 y.o. female presenting with abscess. The history is provided by the patient.  Abscess Location:  Torso Torso abscess location:  R chest Size:  2 cm Abscess quality: draining, fluctuance and painful   Red streaking: no   Duration:  7 days Progression:  Worsening Pain details:    Quality:  Throbbing and sharp   Severity:  Moderate   Duration:  3 days   Timing:  Constant   Progression:  Worsening Chronicity:  New Context: not diabetes, not injected drug use, not insect bite/sting and not skin injury   Relieved by:  Nothing Worsened by:  Nothing tried Ineffective treatments:  Draining/squeezing and warm compresses Associated symptoms: no fever, no nausea and no vomiting     Past Medical History  Diagnosis Date  . Eczema 2008  . Asthma   . Ovarian cyst   . Asthma   . Anxiety   . GERD (gastroesophageal reflux disease)    Past Surgical History  Procedure Laterality Date  . Ovarian cyst removal    . Ovarian cyst removal  2011  . Tubal ligation     Family History  Problem Relation Age of Onset  . Diabetes Mother    History  Substance Use Topics  . Smoking status: Current Every Day Smoker -- 0.50 packs/day for 5 years    Types: Cigarettes  . Smokeless tobacco: Never Used  . Alcohol Use: No   OB History    Gravida Para Term Preterm AB TAB SAB Ectopic Multiple Living   0 0 0 0 0 0 2     Review of Systems  Constitutional: Negative for fever and chills.  Respiratory: Negative for shortness of breath and wheezing.   Gastrointestinal: Negative for nausea and vomiting.  Skin: Positive for wound.  Neurological: Negative for numbness.      Allergies  Review of patient's  allergies indicates no known allergies.  Home Medications   Prior to Admission medications   Medication Sig Start Date End Date Taking? Authorizing Provider  albuterol (PROVENTIL HFA;VENTOLIN HFA) 108 (90 BASE) MCG/ACT inhaler Inhale 2 puffs into the lungs every 6 (six) hours as needed for wheezing.    Historical Provider, MD  ALPRAZolam Prudy Feeler) 1 MG tablet Take 0.5-1 mg by mouth 2 (two) times daily as needed for anxiety.    Historical Provider, MD  clindamycin (CLEOCIN) 150 MG capsule Take 2 capsules (300 mg total) by mouth 3 (three) times daily. Patient not taking: Reported on 08/22/2014 07/16/14   Raeford Razor, MD  HYDROcodone-acetaminophen (NORCO/VICODIN) 5-325 MG per tablet Take 1 tablet by mouth every 4 (four) hours as needed. 09/28/14   Burgess Amor, PA-C  norgestimate-ethinyl estradiol (ORTHO-CYCLEN,SPRINTEC,PREVIFEM) 0.25-35 MG-MCG tablet Take 1 tablet by mouth daily. Patient not taking: Reported on 08/22/2014 07/05/14   Campbell Riches, NP  oxyCODONE-acetaminophen (PERCOCET/ROXICET) 5-325 MG per tablet Take 1 tablet by mouth every 4 (four) hours as needed for severe pain. Patient not taking: Reported on 08/22/2014 07/16/14   Raeford Razor, MD  PARoxetine (PAXIL) 20 MG tablet Take 1 tablet (20 mg total) by mouth daily. Patient not taking: Reported on 08/22/2014 07/05/14   Campbell Riches, NP  promethazine (PHENERGAN) 12.5 MG tablet Take 1  tablet (12.5 mg total) by mouth every 6 (six) hours as needed for nausea or vomiting. 08/23/14   Hope Orlene OchM Neese, NP  ranitidine (ZANTAC) 150 MG tablet Take 1 tablet (150 mg total) by mouth 2 (two) times daily. 08/23/14   Hope Orlene OchM Neese, NP  sulfamethoxazole-trimethoprim (SEPTRA DS) 800-160 MG per tablet Take 1 tablet by mouth every 12 (twelve) hours. 09/28/14   Burgess AmorJulie Lorey Pallett, PA-C   BP 130/86 mmHg  Pulse 96  Temp(Src) 98.9 F (37.2 C) (Core (Comment))  Resp 18  Ht 5\' 3"  (1.6 m)  Wt 160 lb (72.576 kg)  BMI 28.35 kg/m2  SpO2 100%  LMP 09/20/2014 Physical  Exam  Constitutional: She appears well-developed and well-nourished. No distress.  HENT:  Head: Normocephalic.  Neck: Neck supple.  Cardiovascular: Normal rate.   Pulmonary/Chest: Effort normal. She has no wheezes.  Musculoskeletal: Normal range of motion. She exhibits no edema.  Skin:  2 cm fluctuant raised abscess right lower chest wall.  No red streaking, well circumscribed with no surrounding erythema.    ED Course  Procedures (including critical care time)  INCISION AND DRAINAGE Performed by: Burgess AmorIDOL, Keigo Whalley Consent: Verbal consent obtained. Risks and benefits: risks, benefits and alternatives were discussed Type: abscess  Body area: right chest wall  Anesthesia: local infiltration  Incision was made with a scalpel.  Local anesthetic: lidocaine 1% without epinephrine  Anesthetic total: 4 ml  Complexity: complex Blunt dissection to break up loculations  Drainage: purulent  Drainage amount: moderate  Packing material: no gauze  Patient tolerance: Patient tolerated the procedure well with no immediate complications.    Labs Review Labs Reviewed  CULTURE, ROUTINE-ABSCESS    Imaging Review No results found.   EKG Interpretation None      MDM   Final diagnoses:  Abscess    Bactrim,  Hydrocodone, warm soaks.  Prn f/u with pcp and/or return here for any worsening sx.    Burgess AmorJulie Najae Rathert, PA-C 09/28/14 1723  Donnetta HutchingBrian Cook, MD 09/28/14 2130

## 2014-10-01 ENCOUNTER — Telehealth (HOSPITAL_BASED_OUTPATIENT_CLINIC_OR_DEPARTMENT_OTHER): Payer: Self-pay | Admitting: Emergency Medicine

## 2014-10-01 LAB — CULTURE, ROUTINE-ABSCESS: Gram Stain: NONE SEEN

## 2014-10-01 NOTE — Telephone Encounter (Signed)
Post ED Visit - Positive Culture Follow-up  Culture report reviewed by antimicrobial stewardship pharmacist: []  Karen Shields, Pharm.D., BCPS []  Karen Shields, Pharm.D., BCPS [x]  Karen Shields, [address] BCPS []  Karen Shields, [place] BCPS, AAHIVP []  Karen Shields, Pharm.D., BCPS, AAHIVP []  Karen Shields, [address]Ph[address] BCPS  Positive abcess culture + MRSA Treated with Bactrim, organism sensitive to the same and no further patient follow-up is required at this time.  Karen Shields, Karen Shields 10/01/2014, 10:02 AM

## 2014-10-02 ENCOUNTER — Telehealth (HOSPITAL_BASED_OUTPATIENT_CLINIC_OR_DEPARTMENT_OTHER): Payer: Self-pay | Admitting: Emergency Medicine

## 2014-10-02 NOTE — Telephone Encounter (Signed)
Post ED Visit - Positive Culture Follow-up  Culture report reviewed by antimicrobial stewardship pharmacist: []  Karen Shields, Pharm.D., BCPS []  Karen Shields, Pharm.D., BCPS [x]  Karen Shields, Pharm.D., BCPS []  Karen Shields, [address]Ph[address] BCPS, AAHIVP []  Karen Shields, Pharm.D., BCPS, AAHIVP []  Karen Shields, [address]Ph[address] BCPS  Positive abcess culture MRSA Treated with sulfamethoxazole-trimethoprim 800-160mg  po tabs one tab q 12 x 20 tabs, organism sensitive to the same and no further patient follow-up is required at this time.  Karen Shields, Karen Shields 10/02/2014, 3:02 PM

## 2014-11-05 ENCOUNTER — Emergency Department (HOSPITAL_COMMUNITY)
Admission: EM | Admit: 2014-11-05 | Discharge: 2014-11-05 | Disposition: A | Discharge status: Home / Self Care | Payer: 59 | Attending: Emergency Medicine | Admitting: Emergency Medicine

## 2014-11-05 ENCOUNTER — Encounter (HOSPITAL_COMMUNITY): Payer: Self-pay | Admitting: Emergency Medicine

## 2014-11-05 DIAGNOSIS — F419 Anxiety disorder, unspecified: Secondary | ICD-10-CM | POA: Diagnosis not present

## 2014-11-05 DIAGNOSIS — Z79899 Other long term (current) drug therapy: Secondary | ICD-10-CM | POA: Diagnosis not present

## 2014-11-05 DIAGNOSIS — K219 Gastro-esophageal reflux disease without esophagitis: Secondary | ICD-10-CM | POA: Insufficient documentation

## 2014-11-05 DIAGNOSIS — Z792 Long term (current) use of antibiotics: Secondary | ICD-10-CM | POA: Diagnosis not present

## 2014-11-05 DIAGNOSIS — Z720 Tobacco use: Secondary | ICD-10-CM | POA: Insufficient documentation

## 2014-11-05 DIAGNOSIS — L309 Dermatitis, unspecified: Secondary | ICD-10-CM | POA: Insufficient documentation

## 2014-11-05 DIAGNOSIS — J45901 Unspecified asthma with (acute) exacerbation: Secondary | ICD-10-CM | POA: Diagnosis not present

## 2014-11-05 DIAGNOSIS — Z8742 Personal history of other diseases of the female genital tract: Secondary | ICD-10-CM | POA: Diagnosis not present

## 2014-11-05 DIAGNOSIS — R21 Rash and other nonspecific skin eruption: Secondary | ICD-10-CM | POA: Diagnosis present

## 2014-11-05 MED ORDER — TRIAMCINOLONE ACETONIDE 0.025 % EX OINT: 1.0000 "application " | TOPICAL_OINTMENT | Freq: Two times a day (BID) | CUTANEOUS | Status: DC

## 2014-11-05 MED ORDER — DEXAMETHASONE 4 MG PO TABS: 4.0000 mg | ORAL_TABLET | Freq: Two times a day (BID) | ORAL | Status: DC

## 2014-11-05 MED ORDER — ACETAMINOPHEN-CODEINE #3 300-30 MG PO TABS: 1.0000 | ORAL_TABLET | Freq: Four times a day (QID) | ORAL | Status: DC | PRN

## 2014-11-05 NOTE — ED Provider Notes (Signed)
CSN: [id number]     Arrival date & time 11/05/14  1714 History   First MD Initiated Contact with Patient 11/05/14 1910     Chief Complaint  Patient presents with  . Rash     (Consider location/radiation/quality/duration/timing/severity/associated sxs/prior Treatment) Patient is a 24 y.o. female presenting with rash. The history is provided by the patient.  Rash Location:  Hand and shoulder/arm Shoulder/arm rash location:  L hand, R hand, L forearm and R forearm Quality: dryness, itchiness, painful, peeling, redness and scaling   Pain details:    Severity:  Moderate   Onset quality:  Gradual   Duration:  1 month   Timing:  Intermittent   Progression:  Worsening Severity:  Moderate Onset quality:  Gradual Duration:  1 month Timing:  Intermittent Progression:  Worsening Chronicity:  Recurrent Context comment:  History of eczema Relieved by:  Nothing Ineffective treatments:  Moisturizers Associated symptoms: wheezing   Associated symptoms: no abdominal pain, no fever, no induration, no joint pain and no shortness of breath     Past Medical History  Diagnosis Date  . Eczema 2008  . Asthma   . Ovarian cyst   . Asthma   . Anxiety   . GERD (gastroesophageal reflux disease)    Past Surgical History  Procedure Laterality Date  . Ovarian cyst removal    . Ovarian cyst removal  2011  . Tubal ligation     Family History  Problem Relation Age of Onset  . Diabetes Mother    History  Substance Use Topics  . Smoking status: Current Every Day Smoker -- 0.50 packs/day for 5 years    Types: Cigarettes  . Smokeless tobacco: Never Used  . Alcohol Use: Yes     Comment: occassionally   OB History    Gravida Para Term Preterm AB TAB SAB Ectopic Multiple Living   2 2 1  0 0 0 0 0 0 2     Review of Systems  Constitutional: Negative for fever and activity change.       All ROS Neg except as noted in HPI  Eyes: Negative for photophobia and discharge.  Respiratory: Positive for  wheezing. Negative for cough and shortness of breath.   Cardiovascular: Negative for chest pain and palpitations.  Gastrointestinal: Negative for abdominal pain and blood in stool.  Genitourinary: Negative for dysuria, frequency and hematuria.  Musculoskeletal: Negative for back pain, arthralgias and neck pain.  Skin: Positive for rash.  Neurological: Negative for dizziness, seizures and speech difficulty.  Psychiatric/Behavioral: Negative for hallucinations and confusion. The patient is nervous/anxious.       Allergies  Review of patient's allergies indicates no known allergies.  Home Medications   Prior to Admission medications   Medication Sig Start Date End Date Taking? Authorizing Provider  albuterol (PROVENTIL HFA;VENTOLIN HFA) 108 (90 BASE) MCG/ACT inhaler Inhale 2 puffs into the lungs every 6 (six) hours as needed for wheezing.    Historical Provider, MD  ALPRAZolam Prudy Feeler(XANAX) 1 MG tablet Take 0.5-1 mg by mouth 2 (two) times daily as needed for anxiety.    Historical Provider, MD  clindamycin (CLEOCIN) 150 MG capsule Take 2 capsules (300 mg total) by mouth 3 (three) times daily. Patient not taking: Reported on 08/22/2014 07/16/14   Raeford RazorStephen Kohut, MD  HYDROcodone-acetaminophen (NORCO/VICODIN) 5-325 MG per tablet Take 1 tablet by mouth every 4 (four) hours as needed. 09/28/14   Burgess AmorJulie Idol, PA-C  norgestimate-ethinyl estradiol (ORTHO-CYCLEN,SPRINTEC,PREVIFEM) 0.25-35 MG-MCG tablet Take 1 tablet by mouth daily.  Patient not taking: Reported on 08/22/2014 07/05/14   Campbell Riches, NP  oxyCODONE-acetaminophen (PERCOCET/ROXICET) 5-325 MG per tablet Take 1 tablet by mouth every 4 (four) hours as needed for severe pain. Patient not taking: Reported on 08/22/2014 07/16/14   Raeford Razor, MD  PARoxetine (PAXIL) 20 MG tablet Take 1 tablet (20 mg total) by mouth daily. Patient not taking: Reported on 08/22/2014 07/05/14   Campbell Riches, NP  promethazine (PHENERGAN) 12.5 MG tablet Take 1  tablet (12.5 mg total) by mouth every 6 (six) hours as needed for nausea or vomiting. 08/23/14   Hope Orlene Och, NP  ranitidine (ZANTAC) 150 MG tablet Take 1 tablet (150 mg total) by mouth 2 (two) times daily. 08/23/14   Hope Orlene Och, NP  sulfamethoxazole-trimethoprim (SEPTRA DS) 800-160 MG per tablet Take 1 tablet by mouth every 12 (twelve) hours. 09/28/14   Burgess Amor, PA-C   BP 112/86 mmHg  Pulse 95  Temp(Src) 98.6 F (37 C) (Oral)  Resp 16  Ht  (1.626 m)  Wt 158 lb (71.668 kg)  BMI 27.11 kg/m2  SpO2 100%  LMP 10/07/2014 Physical Exam  Constitutional: She is oriented to person, place, and time. She appears well-developed and well-nourished.  Non-toxic appearance.  HENT:  Head: Normocephalic.  Right Ear: Tympanic membrane and external ear normal.  Left Ear: Tympanic membrane and external ear normal.  Eyes: EOM and lids are normal. Pupils are equal, round, and reactive to light.  Neck: Normal range of motion. Neck supple. Carotid bruit is not present.  Cardiovascular: Normal rate, regular rhythm, normal heart sounds, intact distal pulses and normal pulses.   Pulmonary/Chest: Breath sounds normal. No respiratory distress.  Abdominal: Soft. Bowel sounds are normal. There is no tenderness. There is no guarding.  Musculoskeletal: Normal range of motion.  Patient has splotches with rid, dry, scaling areas. These extend from the fingers to the mid forearm on the right and the left. There is no drainage noted. And no evidence of a secondary infection.  Patient has good range of motion of the fingers. Capillary refill is less than 2 seconds. Radial pulses are 2+.  Lymphadenopathy:       Head (right side): No submandibular adenopathy present.       Head (left side): No submandibular adenopathy present.    She has no cervical adenopathy.  Neurological: She is alert and oriented to person, place, and time. She has normal strength. No cranial nerve deficit or sensory deficit.  Skin: Skin is  warm and dry.  Psychiatric: She has a normal mood and affect. Her speech is normal.  Nursing note and vitals reviewed.   ED Course  Procedures (including critical care time) Labs Review Labs Reviewed - No data to display  Imaging Review No results found.   EKG Interpretation None      MDM  Examination is consistent with an acute exacerbation of eczema. The patient requests assistance also with pain and discomfort. The patient will be treated with Tylenol codeine, Decadron, and triamcinolone ointment.    Final diagnoses:  None    **I have reviewed nursing notes, vital signs, and all appropriate lab and imaging results for this patient.Kathie Dike, PA-C 11/05/14 1928  Samuel Jester, DO 11/07/14 1544

## 2014-11-05 NOTE — Discharge Instructions (Signed)
Eczema Eczema, also called atopic dermatitis, is a skin disorder that causes inflammation of the skin. It causes a red rash and dry, scaly skin. The skin becomes very itchy. Eczema is generally worse during the cooler winter months and often improves with the warmth of summer. Eczema usually starts showing signs in infancy. Some children outgrow eczema, but it may last through adulthood.  CAUSES  The exact cause of eczema is not known, but it appears to run in families. People with eczema often have a family history of eczema, allergies, asthma, or hay fever. Eczema is not contagious. Flare-ups of the condition may be caused by:   Contact with something you are sensitive or allergic to.   Stress. SIGNS AND SYMPTOMS  Dry, scaly skin.   Red, itchy rash.   Itchiness. This may occur before the skin rash and may be very intense.  DIAGNOSIS  The diagnosis of eczema is usually made based on symptoms and medical history. TREATMENT  Eczema cannot be cured, but symptoms usually can be controlled with treatment and other strategies. A treatment plan might include:  Controlling the itching and scratching.   Use over-the-counter antihistamines as directed for itching. This is especially useful at night when the itching tends to be worse.   Use over-the-counter steroid creams as directed for itching.   Avoid scratching. Scratching makes the rash and itching worse. It may also result in a skin infection (impetigo) due to a break in the skin caused by scratching.   Keeping the skin well moisturized with creams every day. This will seal in moisture and help prevent dryness. Lotions that contain alcohol and water should be avoided because they can dry the skin.   Limiting exposure to things that you are sensitive or allergic to (allergens).   Recognizing situations that cause stress.   Developing a plan to manage stress.  HOME CARE INSTRUCTIONS   Only take over-the-counter or  prescription medicines as directed by your health care provider.   Do not use anything on the skin without checking with your health care provider.   Keep baths or showers short (5 minutes) in warm (not hot) water. Use mild cleansers for bathing. These should be unscented. You may add nonperfumed bath oil to the bath water. It is best to avoid soap and bubble bath.   Immediately after a bath or shower, when the skin is still damp, apply a moisturizing ointment to the entire body. This ointment should be a petroleum ointment. This will seal in moisture and help prevent dryness. The thicker the ointment, the better. These should be unscented.   Keep fingernails cut short. Children with eczema may need to wear soft gloves or mittens at night after applying an ointment.   Dress in clothes made of cotton or cotton blends. Dress lightly, because heat increases itching.   A child with eczema should stay away from anyone with fever blisters or cold sores. The virus that causes fever blisters (herpes simplex) can cause a serious skin infection in children with eczema. SEEK MEDICAL CARE IF:   Your itching interferes with sleep.   Your rash gets worse or is not better within 1 week after starting treatment.   You see pus or soft yellow scabs in the rash area.   You have a fever.   You have a rash flare-up after contact with someone who has fever blisters.  Document Released: 09/03/2000 Document Revised: 06/27/2013 Document Reviewed: 04/09/2013 [employer] Patient Information 2015 [employer]. This information   is not intended to replace advice given to you by your health care provider. Make sure you discuss any questions you have with your health care provider.  

## 2014-11-05 NOTE — ED Notes (Signed)
PT c/o dry flaky and cracking skin to bilateral hands worsening x1 month with a rash extending to forearms x2 weeks. PT stated she was prescribed a cream x805months ago that helped her skin.

## 2014-11-08 ENCOUNTER — Ambulatory Visit (INDEPENDENT_AMBULATORY_CARE_PROVIDER_SITE_OTHER): Payer: 59 | Admitting: Nurse Practitioner

## 2014-11-08 ENCOUNTER — Encounter: Payer: Self-pay | Admitting: Nurse Practitioner

## 2014-11-08 VITALS — BP 152/90 | Temp 98.2°F | Ht 63.0 in | Wt 162.0 lb

## 2014-11-08 DIAGNOSIS — L301 Dyshidrosis [pompholyx]: Secondary | ICD-10-CM

## 2014-11-08 DIAGNOSIS — L209 Atopic dermatitis, unspecified: Secondary | ICD-10-CM

## 2014-11-08 MED ORDER — CEFPROZIL 500 MG PO TABS: 500.0000 mg | ORAL_TABLET | Freq: Two times a day (BID) | ORAL | Status: DC

## 2014-11-08 MED ORDER — ALBUTEROL SULFATE HFA 108 (90 BASE) MCG/ACT IN AERS: 2.0000 | INHALATION_SPRAY | Freq: Four times a day (QID) | RESPIRATORY_TRACT | Status: DC | PRN

## 2014-11-08 MED ORDER — PREDNISONE 20 MG PO TABS: ORAL_TABLET | ORAL | Status: DC

## 2014-11-08 MED ORDER — BETAMETHASONE DIPROPIONATE 0.05 % EX CREA: TOPICAL_CREAM | Freq: Two times a day (BID) | CUTANEOUS | Status: DC

## 2014-11-08 MED ORDER — RANITIDINE HCL 150 MG PO TABS: 150.0000 mg | ORAL_TABLET | Freq: Two times a day (BID) | ORAL | Status: DC

## 2014-11-08 NOTE — Patient Instructions (Addendum)
Avoid excessive exposure to water Moisturize with Cetaphil cream Dove unscented soap Loratadine 10 mg in the morning Benadryl 25 mg at night vaseline to hands with gloves at night

## 2014-11-12 ENCOUNTER — Encounter: Payer: Self-pay | Admitting: Nurse Practitioner

## 2014-11-12 NOTE — Progress Notes (Signed)
Subjective:  Presents for c/o a rash x 2 months. Started on hands and over the past 2 weeks has spread to arms and trunk. Very pruritic. No known allergens. No fever. Went to ED on 2/16; did not start oral steroids or topical steroid prescribed. No known contacts.  Objective:   BP 152/90 mmHg  Temp(Src) 98.2 F (36.8 C)  Ht 5\' 3"  (1.6 m)  Wt 162 lb (73.483 kg)  BMI 28.70 kg/m2  LMP 10/07/2014 NAD. Alert, oriented. Extremely dry skin with several superficial fissures noted on the fingers. Some excoriation. A few clear fluid filled vesicles. Hands are the worst, although patches of mildly erythematous dry skin with papules noted on various areas of arms and a few on the trunk.   Assessment: Dyshidrotic eczema  Atopic dermatitis  Plan:  Meds ordered this encounter  Medications  . albuterol (PROVENTIL HFA;VENTOLIN HFA) 108 (90 BASE) MCG/ACT inhaler    Sig: Inhale 2 puffs into the lungs every 6 (six) hours as needed for wheezing.    Dispense:  18 g    Refill:  5  . ranitidine (ZANTAC) 150 MG tablet    Sig: Take 1 tablet (150 mg total) by mouth 2 (two) times daily.    Dispense:  60 tablet    Refill:  2  . predniSONE (DELTASONE) 20 MG tablet    Sig: 3 po qd x 3 d then 2 po qd x 3 d then 1 po qd x 3 d    Dispense:  18 tablet    Refill:  0    Order Specific Question:  Supervising Provider    Answer:  Merlyn AlbertLUKING, WILLIAM S [[id number]]  . betamethasone dipropionate (DIPROLENE) 0.05 % cream    Sig: Apply topically 2 (two) times daily.    Dispense:  30 g    Refill:  0    Order Specific Question:  Supervising Provider    Answer:  Merlyn AlbertLUKING, WILLIAM S [[id number]]  . cefPROZIL (CEFZIL) 500 MG tablet    Sig: Take 1 tablet (500 mg total) by mouth 2 (two) times daily.    Dispense:  14 tablet    Refill:  0    Order Specific Question:  Supervising Provider    Answer:  Merlyn AlbertLUKING, WILLIAM S [[id number]]   Call back in 10-14 days if no improvement, sooner if worse. Given written Rx for antibiotics in case of  infection. Avoid excessive exposure to water Moisturize with Cetaphil cream Dove unscented soap Loratadine 10 mg in the morning Benadryl 25 mg at night vaseline to hands with gloves at night Keep headache diary and schedule visit for recheck of headaches.

## 2014-11-22 ENCOUNTER — Ambulatory Visit: Payer: 59 | Admitting: Nurse Practitioner

## 2014-11-27 ENCOUNTER — Ambulatory Visit (INDEPENDENT_AMBULATORY_CARE_PROVIDER_SITE_OTHER): Payer: 59 | Admitting: Nurse Practitioner

## 2014-11-27 ENCOUNTER — Encounter: Payer: Self-pay | Admitting: Nurse Practitioner

## 2014-11-27 VITALS — BP 130/80 | Ht 63.0 in | Wt 162.0 lb

## 2014-11-27 DIAGNOSIS — N943 Premenstrual tension syndrome: Secondary | ICD-10-CM | POA: Diagnosis not present

## 2014-11-27 DIAGNOSIS — L301 Dyshidrosis [pompholyx]: Secondary | ICD-10-CM

## 2014-11-27 DIAGNOSIS — G43829 Menstrual migraine, not intractable, without status migrainosus: Secondary | ICD-10-CM

## 2014-11-27 MED ORDER — RIZATRIPTAN BENZOATE 10 MG PO TBDP: 10.0000 mg | ORAL_TABLET | ORAL | Status: DC | PRN

## 2014-11-27 MED ORDER — NAPROXEN 500 MG PO TABS: 500.0000 mg | ORAL_TABLET | Freq: Two times a day (BID) | ORAL | Status: DC

## 2014-11-27 MED ORDER — TOPIRAMATE 50 MG PO TABS: ORAL_TABLET | ORAL | Status: DC

## 2014-11-30 ENCOUNTER — Encounter: Payer: Self-pay | Admitting: Nurse Practitioner

## 2014-11-30 NOTE — Progress Notes (Signed)
Subjective:  Presents for recheck. Rash greatly improved with oral prednisone but is starting to come back. Started on fingers again. Has had 2 migraines since last visit. Squeezing slightly throbbing sensation that can last all day. Photosensitivity and phonophobia. Occasional nausea/vomiting. excedrin migraine relieves about 2 hours. Blurred vision. No numbness or weakness of face, arm or legs. Regular menses. Headaches start right before cycle and lasts entire cycle up to 6-7 d.   Objective:   BP 130/80 mmHg  Ht 5\' 3"  (1.6 m)  Wt 162 lb (73.483 kg)  BMI 28.70 kg/m2  LMP 10/07/2014 NAD. Alert, oriented. Lungs clear. Heart RRR. Dry patches with clear fluid filled lesions noted on fingers. Some fissures noted.   Assessment:  Problem List Items Addressed This Visit      Cardiovascular and Mediastinum   Menstrual migraine without status migrainosus, not intractable - Primary   Relevant Medications   topiramate (TOPAMAX) tablet   rizatriptan (MAXALT MLT) disintegrating tablet   naproxen (NAPROSYN) tablet    Other Visit Diagnoses    Dyshidrotic eczema            Plan:  Meds ordered this encounter  Medications  . topiramate (TOPAMAX) 50 MG tablet    Sig: 1/2 po qhs x 6 d then one po qhs    Dispense:  49 tablet    Refill:  0    Order Specific Question:  Supervising Provider    Answer:  Merlyn AlbertLUKING, WILLIAM S [[id number]]  . rizatriptan (MAXALT-MLT) 10 MG disintegrating tablet    Sig: Take 1 tablet (10 mg total) by mouth as needed for migraine. May repeat in 2 hours if needed; max 2 per 24 hrs    Dispense:  10 tablet    Refill:  0    Order Specific Question:  Supervising Provider    Answer:  Merlyn AlbertLUKING, WILLIAM S [[id number]]  . naproxen (NAPROSYN) 500 MG tablet    Sig: Take 1 tablet (500 mg total) by mouth 2 (two) times daily with a meal. Prn migraine    Dispense:  30 tablet    Refill:  0    Order Specific Question:  Supervising Provider    Answer:  Riccardo DubinLUKING, WILLIAM S [[id number]]   Refer to  dermatology. Take naproxen and maxalt at onset of migraine.  Return in about 1 month (around 12/28/2014). Call back sooner if needed.

## 2014-12-09 ENCOUNTER — Ambulatory Visit (INDEPENDENT_AMBULATORY_CARE_PROVIDER_SITE_OTHER): Payer: 59 | Admitting: Nurse Practitioner

## 2014-12-09 VITALS — Ht 63.0 in | Wt 161.6 lb

## 2014-12-09 DIAGNOSIS — F419 Anxiety disorder, unspecified: Secondary | ICD-10-CM

## 2014-12-09 DIAGNOSIS — Z22322 Carrier or suspected carrier of Methicillin resistant Staphylococcus aureus: Secondary | ICD-10-CM | POA: Diagnosis not present

## 2014-12-09 MED ORDER — SULFAMETHOXAZOLE-TRIMETHOPRIM 800-160 MG PO TABS: 1.0000 | ORAL_TABLET | Freq: Two times a day (BID) | ORAL | Status: DC

## 2014-12-09 MED ORDER — ALPRAZOLAM 1 MG PO TABS: 0.5000 mg | ORAL_TABLET | Freq: Two times a day (BID) | ORAL | Status: DC | PRN

## 2014-12-09 MED ORDER — ONDANSETRON 8 MG PO TBDP: 8.0000 mg | ORAL_TABLET | Freq: Three times a day (TID) | ORAL | Status: DC | PRN

## 2014-12-11 ENCOUNTER — Encounter: Payer: Self-pay | Admitting: Nurse Practitioner

## 2014-12-11 DIAGNOSIS — Z22322 Carrier or suspected carrier of Methicillin resistant Staphylococcus aureus: Secondary | ICD-10-CM | POA: Insufficient documentation

## 2014-12-11 NOTE — Progress Notes (Signed)
Subjective:  Presents for c/o abscesses off/on various parts of the body for about a month, more in the past week. Has been applying warm compresses and drying to squeeze out drainage. No significant fever. Was seen at ED on 1/9 for I&D of lesion. Also experiencing more anxiety lately due to personal issues.  Objective:   Ht 5\' 3"  (1.6 m)  Wt 161 lb 9.6 oz (73.301 kg)  BMI 28.63 kg/m2 NAD. Alert, oriented. Moderately anxious affect. Hard to keep patient on topic. Lungs clear. Heart RRR. Several small abscesses noted on mid abd, hip area and lower leg. A few other healing areas noted. None of the areas need I&D at this time. Wound culture on 1/9 shows MRSA.   Assessment: MRSA (methicillin resistant staph aureus) culture positive  Anxiety  Plan:  Meds ordered this encounter  Medications  . sulfamethoxazole-trimethoprim (BACTRIM DS,SEPTRA DS) 800-160 MG per tablet    Sig: Take 1 tablet by mouth 2 (two) times daily.    Dispense:  20 tablet    Refill:  2    Order Specific Question:  Supervising Provider    Answer:  Merlyn AlbertLUKING, WILLIAM S [[id number]]  . ondansetron (ZOFRAN-ODT) 8 MG disintegrating tablet    Sig: Take 1 tablet (8 mg total) by mouth every 8 (eight) hours as needed for nausea or vomiting.    Dispense:  20 tablet    Refill:  0    Order Specific Question:  Supervising Provider    Answer:  Merlyn AlbertLUKING, WILLIAM S [[id number]]  . ALPRAZolam (XANAX) 1 MG tablet    Sig: Take 0.5-1 tablets (0.5-1 mg total) by mouth 2 (two) times daily as needed for anxiety.    Dispense:  60 tablet    Refill:  2    May refill monthly    Order Specific Question:  Supervising Provider    Answer:  Merlyn AlbertLUKING, WILLIAM S [[id number]]   Continue warm compresses. Reviewed usual course of community acquired MRSA and prevention of spread to others. Call back if any problems. Defers daily med for anxiety at this time. Use xanax sparingly; limit to 60 per month. Strongly recommend wellness checkup this year.  Return if symptoms worsen or  fail to improve.

## 2014-12-12 ENCOUNTER — Other Ambulatory Visit: Payer: Self-pay | Admitting: Nurse Practitioner

## 2014-12-12 MED ORDER — RANITIDINE HCL 150 MG PO TABS: 150.0000 mg | ORAL_TABLET | Freq: Two times a day (BID) | ORAL | Status: DC

## 2015-01-15 ENCOUNTER — Encounter: Payer: Self-pay | Admitting: Nurse Practitioner

## 2015-01-15 ENCOUNTER — Ambulatory Visit (INDEPENDENT_AMBULATORY_CARE_PROVIDER_SITE_OTHER): Payer: 59 | Admitting: Nurse Practitioner

## 2015-01-15 VITALS — BP 126/90 | Ht 63.0 in | Wt 158.8 lb

## 2015-01-15 DIAGNOSIS — F419 Anxiety disorder, unspecified: Secondary | ICD-10-CM

## 2015-01-15 MED ORDER — ESCITALOPRAM OXALATE 10 MG PO TABS: 10.0000 mg | ORAL_TABLET | Freq: Every day | ORAL | Status: DC

## 2015-01-16 ENCOUNTER — Encounter: Payer: Self-pay | Admitting: Nurse Practitioner

## 2015-01-16 NOTE — Progress Notes (Signed)
Subjective:  Presents for recheck on her anxiety. Has been treated for this off and on for years. Increased personal stress, very bad over the weekend. Now experiencing lack of interest in social activities, fatigue, sleep disturbance, decreased libido decreased appetite and emotional lability. Denies suicidal or homicidal thoughts or ideations. Her eczema on her hands was almost resolved; worsened with increased stress. Took Lexapro in the past, had problems but dosing was well above recommended dose.   Objective:   BP 126/90 mmHg  Ht 5\' 3"  (1.6 m)  Wt 158 lb 12.8 oz (72.031 kg)  BMI 28.14 kg/m2  LMP 01/11/2015 NAD. Alert, oriented. Anxious affect. Thoughts logical, coherent and relevant. Lungs clear. Heart RRR.  Assessment:  Problem List Items Addressed This Visit      Other   Anxiety - Primary   Relevant Medications   escitalopram (LEXAPRO) 10 MG tablet     Plan:  Meds ordered this encounter  Medications  . DISCONTD: escitalopram (LEXAPRO) 10 MG tablet    Sig: Take 1 tablet (10 mg total) by mouth daily.    Dispense:  30 tablet    Refill:  2    Order Specific Question:  Supervising Provider    Answer:  Merlyn AlbertLUKING, WILLIAM S [[id number]]  . escitalopram (LEXAPRO) 10 MG tablet    Sig: Take 1 tablet (10 mg total) by mouth daily.    Dispense:  30 tablet    Refill:  2   Restart Lexapro at 10 mg dose. Discussed importance of stress reduction. DC med and call if any problems.  Return in about 3 months (around 04/16/2015). Call back sooner if any problems.

## 2015-02-18 ENCOUNTER — Other Ambulatory Visit: Payer: Self-pay | Admitting: Nurse Practitioner

## 2015-03-10 ENCOUNTER — Emergency Department (HOSPITAL_COMMUNITY)
Admission: EM | Admit: 2015-03-10 | Discharge: 2015-03-11 | Disposition: A | Discharge status: Home / Self Care | Payer: 59 | Attending: Emergency Medicine | Admitting: Emergency Medicine

## 2015-03-10 ENCOUNTER — Encounter (HOSPITAL_COMMUNITY): Payer: Self-pay | Admitting: *Deleted

## 2015-03-10 DIAGNOSIS — Z792 Long term (current) use of antibiotics: Secondary | ICD-10-CM | POA: Insufficient documentation

## 2015-03-10 DIAGNOSIS — F419 Anxiety disorder, unspecified: Secondary | ICD-10-CM | POA: Diagnosis not present

## 2015-03-10 DIAGNOSIS — L02213 Cutaneous abscess of chest wall: Secondary | ICD-10-CM | POA: Diagnosis not present

## 2015-03-10 DIAGNOSIS — Z720 Tobacco use: Secondary | ICD-10-CM | POA: Diagnosis not present

## 2015-03-10 DIAGNOSIS — Z791 Long term (current) use of non-steroidal anti-inflammatories (NSAID): Secondary | ICD-10-CM | POA: Insufficient documentation

## 2015-03-10 DIAGNOSIS — K219 Gastro-esophageal reflux disease without esophagitis: Secondary | ICD-10-CM | POA: Diagnosis not present

## 2015-03-10 DIAGNOSIS — Z79899 Other long term (current) drug therapy: Secondary | ICD-10-CM | POA: Insufficient documentation

## 2015-03-10 DIAGNOSIS — Z7952 Long term (current) use of systemic steroids: Secondary | ICD-10-CM | POA: Insufficient documentation

## 2015-03-10 DIAGNOSIS — J45909 Unspecified asthma, uncomplicated: Secondary | ICD-10-CM | POA: Diagnosis not present

## 2015-03-10 DIAGNOSIS — Z8742 Personal history of other diseases of the female genital tract: Secondary | ICD-10-CM | POA: Insufficient documentation

## 2015-03-10 NOTE — ED Notes (Signed)
Abscess to lt lt thorax, for 2 days, pt has a potato taped to site

## 2015-03-11 MED ORDER — PROMETHAZINE HCL 12.5 MG PO TABS
12.5000 mg | ORAL_TABLET | Freq: Once | ORAL | Status: AC
  Administered 2015-03-11: 12.5 mg via ORAL
  Filled 2015-03-11: qty 1

## 2015-03-11 MED ORDER — IBUPROFEN 600 MG PO TABS: 600.0000 mg | ORAL_TABLET | Freq: Four times a day (QID) | ORAL | Status: DC

## 2015-03-11 MED ORDER — DOXYCYCLINE HYCLATE 100 MG PO CAPS: 100.0000 mg | ORAL_CAPSULE | Freq: Two times a day (BID) | ORAL | Status: DC

## 2015-03-11 MED ORDER — HYDROCODONE-ACETAMINOPHEN 5-325 MG PO TABS: 1.0000 | ORAL_TABLET | ORAL | Status: DC | PRN

## 2015-03-11 MED ORDER — IBUPROFEN 400 MG PO TABS
400.0000 mg | ORAL_TABLET | Freq: Once | ORAL | Status: AC
  Administered 2015-03-11: 400 mg via ORAL
  Filled 2015-03-11: qty 1

## 2015-03-11 MED ORDER — HYDROCODONE-ACETAMINOPHEN 5-325 MG PO TABS
1.0000 | ORAL_TABLET | Freq: Once | ORAL | Status: AC
  Administered 2015-03-11: 1 via ORAL
  Filled 2015-03-11: qty 1

## 2015-03-11 MED ORDER — DOXYCYCLINE HYCLATE 100 MG PO TABS
100.0000 mg | ORAL_TABLET | Freq: Once | ORAL | Status: AC
  Administered 2015-03-11: 100 mg via ORAL
  Filled 2015-03-11: qty 1

## 2015-03-11 NOTE — Discharge Instructions (Signed)
Please soak in a tub of warm water with Epsom salts for 15 minutes daily. Please apply dressing to the abscess area to prevent clothing from rubbing on the area. Please use doxycycline 2 times daily, and ibuprofen 4 times daily with food. May use Norco for pain if needed. Norco may cause drowsiness, please use this medication with caution. Please see your primary physician, or return to the emergency department if any changes, problems, or concerns. Abscess An abscess (boil or furuncle) is an infected area on or under the skin. This area is filled with yellowish-white fluid (pus) and other material (debris). HOME CARE   Only take medicines as told by your doctor.  If you were given antibiotic medicine, take it as directed. Finish the medicine even if you start to feel better.  If gauze is used, follow your doctor's directions for changing the gauze.  To avoid spreading the infection:  Keep your abscess covered with a bandage.  Wash your hands well.  Do not share personal care items, towels, or whirlpools with others.  Avoid skin contact with others.  Keep your skin and clothes clean around the abscess.  Keep all doctor visits as told. GET HELP RIGHT AWAY IF:   You have more pain, puffiness (swelling), or redness in the wound site.  You have more fluid or blood coming from the wound site.  You have muscle aches, chills, or you feel sick.  You have a fever. MAKE SURE YOU:   Understand these instructions.  Will watch your condition.  Will get help right away if you are not doing well or get worse. Document Released: 02/23/2008 Document Revised: 03/07/2012 Document Reviewed: 11/19/2011 [facility] Patient Information 2015 [place], [place]. This information is not intended to replace advice given to you by your health care provider. Make sure you discuss any questions you have with your health care provider.

## 2015-03-11 NOTE — ED Provider Notes (Signed)
CSN: [id number]     Arrival date & time 03/10/15  2237 History   First MD Initiated Contact with Patient 03/10/15 2325     Chief Complaint  Patient presents with  . Abscess     (Consider location/radiation/quality/duration/timing/severity/associated sxs/prior Treatment) Patient is a 24 y.o. female presenting with abscess. The history is provided by the patient.  Abscess Location:  Torso Torso abscess location: left chest wall. Abscess quality: painful   Abscess quality: not draining   Red streaking: no   Duration:  3 days Progression:  Worsening Pain details:    Quality:  Aching   Severity:  Moderate   Duration:  3 days   Timing:  Intermittent   Progression:  Worsening Chronicity:  Recurrent Context: not diabetes and not immunosuppression   Relieved by:  Nothing Associated symptoms: no fever, no nausea and no vomiting   Risk factors: prior abscess     Past Medical History  Diagnosis Date  . Eczema 2008  . Asthma   . Ovarian cyst   . Asthma   . Anxiety   . GERD (gastroesophageal reflux disease)    Past Surgical History  Procedure Laterality Date  . Ovarian cyst removal    . Ovarian cyst removal  2011  . Tubal ligation     Family History  Problem Relation Age of Onset  . Diabetes Mother    History  Substance Use Topics  . Smoking status: Current Every Day Smoker -- 0.50 packs/day for 5 years    Types: Cigarettes    Start date: 02/11/2006  . Smokeless tobacco: Never Used  . Alcohol Use: 0.0 oz/week    0 Standard drinks or equivalent per week     Comment: occassionally   OB History    Gravida Para Term Preterm AB TAB SAB Ectopic Multiple Living   2 2 1  0 0 0 0 0 0 2     Review of Systems  Constitutional: Negative for fever.  Gastrointestinal: Negative for nausea and vomiting.  Skin: Positive for rash and wound.  Psychiatric/Behavioral: The patient is nervous/anxious.   All other systems reviewed and are negative.     Allergies  Review of  patient's allergies indicates no known allergies.  Home Medications   Prior to Admission medications   Medication Sig Start Date End Date Taking? Authorizing Provider  albuterol (PROVENTIL HFA;VENTOLIN HFA) 108 (90 BASE) MCG/ACT inhaler Inhale 2 puffs into the lungs every 6 (six) hours as needed for wheezing. 11/08/14   Campbell Riches, NP  ALPRAZolam Prudy Feeler) 1 MG tablet Take 0.5-1 tablets (0.5-1 mg total) by mouth 2 (two) times daily as needed for anxiety. 12/09/14   Campbell Riches, NP  betamethasone dipropionate (DIPROLENE) 0.05 % cream Apply topically 2 (two) times daily. 11/08/14   Campbell Riches, NP  escitalopram (LEXAPRO) 10 MG tablet Take 1 tablet (10 mg total) by mouth daily. 01/15/15 01/15/16  Campbell Riches, NP  naproxen (NAPROSYN) 500 MG tablet Take 1 tablet (500 mg total) by mouth 2 (two) times daily with a meal. Prn migraine 11/27/14   Campbell Riches, NP  ondansetron (ZOFRAN-ODT) 8 MG disintegrating tablet Take 1 tablet (8 mg total) by mouth every 8 (eight) hours as needed for nausea or vomiting. 12/09/14   Campbell Riches, NP  promethazine (PHENERGAN) 12.5 MG tablet Take 1 tablet (12.5 mg total) by mouth every 6 (six) hours as needed for nausea or vomiting. 08/23/14   Hope Orlene Och, NP  ranitidine (ZANTAC) 150  MG tablet Take 1 tablet (150 mg total) by mouth 2 (two) times daily. 12/12/14   Campbell Riches, NP  rizatriptan (MAXALT-MLT) 10 MG disintegrating tablet Take 1 tablet (10 mg total) by mouth as needed for migraine. May repeat in 2 hours if needed; max 2 per 24 hrs 11/27/14   Campbell Riches, NP  sulfamethoxazole-trimethoprim (BACTRIM DS,SEPTRA DS) 800-160 MG per tablet Take 1 tablet by mouth 2 (two) times daily. 12/09/14   Campbell Riches, NP  topiramate (TOPAMAX) 50 MG tablet One po qhs for migraines 02/20/15   Campbell Riches, NP  triamcinolone (KENALOG) 0.025 % ointment Apply 1 application topically 2 (two) times daily. 11/05/14   Ivery Quale, PA-C   BP 162/85 mmHg   Pulse 115  Temp(Src) 98.8 F (37.1 C) (Oral)  Resp 20  Ht  (1.6 m)  Wt 154 lb 6.4 oz (70.035 kg)  BMI 27.36 kg/m2  SpO2 99%  LMP 03/07/2015 (Exact Date)  Breastfeeding? No Physical Exam  Constitutional: She is oriented to person, place, and time. She appears well-developed and well-nourished.  Non-toxic appearance.  HENT:  Head: Normocephalic.  Right Ear: Tympanic membrane and external ear normal.  Left Ear: Tympanic membrane and external ear normal.  Eyes: EOM and lids are normal. Pupils are equal, round, and reactive to light.  Neck: Normal range of motion. Neck supple. Carotid bruit is not present.  Cardiovascular: Normal rate, regular rhythm, normal heart sounds, intact distal pulses and normal pulses.   Pulmonary/Chest: Breath sounds normal. No respiratory distress.    Abdominal: Soft. Bowel sounds are normal. There is no tenderness. There is no guarding.  Musculoskeletal: Normal range of motion.  Lymphadenopathy:       Head (right side): No submandibular adenopathy present.       Head (left side): No submandibular adenopathy present.    She has no cervical adenopathy.  Neurological: She is alert and oriented to person, place, and time. She has normal strength. No cranial nerve deficit or sensory deficit.  Skin: Skin is warm and dry.  Psychiatric: Her speech is normal. Her mood appears anxious.  Pt tearful during the examination.  Nursing note and vitals reviewed.   ED Course  Procedures (including critical care time) Labs Review Labs Reviewed - No data to display  Imaging Review No results found.   EKG Interpretation None      MDM  The abscess of the left chest wall/rib area is not a candidate for incision and drainage at this time. The plan will be to use warm tub soaks. The patient will be placed on doxycycline, Norco, and ibuprofen. The patient is to see her primary physician, or return to the emergency department if not improving.    Final  diagnoses:  None    *I have reviewed nursing notes, vital signs, and all appropriate lab and imaging results for this patient.[address] PA-C 03/11/15 0023  Shon Baton, MD 03/11/15 [phone]

## 2015-03-21 ENCOUNTER — Other Ambulatory Visit: Payer: Self-pay | Admitting: Nurse Practitioner

## 2015-03-21 MED ORDER — ALPRAZOLAM 1 MG PO TABS: 0.5000 mg | ORAL_TABLET | Freq: Two times a day (BID) | ORAL | Status: DC | PRN

## 2015-03-21 MED ORDER — RANITIDINE HCL 150 MG PO TABS: 150.0000 mg | ORAL_TABLET | Freq: Two times a day (BID) | ORAL | Status: DC

## 2015-04-01 ENCOUNTER — Other Ambulatory Visit: Payer: Self-pay | Admitting: Nurse Practitioner

## 2015-04-01 MED ORDER — PANTOPRAZOLE SODIUM 40 MG PO TBEC: 40.0000 mg | DELAYED_RELEASE_TABLET | Freq: Every day | ORAL | Status: DC

## 2015-04-07 ENCOUNTER — Telehealth: Payer: Self-pay | Admitting: Family Medicine

## 2015-04-07 ENCOUNTER — Other Ambulatory Visit: Payer: Self-pay | Admitting: Nurse Practitioner

## 2015-04-07 MED ORDER — PREDNISONE 20 MG PO TABS
ORAL_TABLET | ORAL | Status: DC
Start: 2015-04-07 — End: 2015-04-28

## 2015-04-07 NOTE — Telephone Encounter (Signed)
Hands much worse today with extremely dry skin and multiple fissures. No signs of infection. Given steroid taper. Call back in 7-10 days if no improvement, sooner if needed. Avoid exposure to water.

## 2015-04-07 NOTE — Telephone Encounter (Signed)
Pt states she missed her dermatology appt for several reasons, to include her phone  Would not call out, she didn't have the gas and it was too much money.   She now is having issues with her eczema on her hands an it is causing her a great  Deal of pain an discomfort. Causing her to lose sleep at night from it.   Can we call her in something else?

## 2015-04-11 ENCOUNTER — Emergency Department (HOSPITAL_COMMUNITY)
Admission: EM | Admit: 2015-04-11 | Discharge: 2015-04-11 | Disposition: A | Discharge status: Home / Self Care | Payer: 59 | Attending: Emergency Medicine | Admitting: Emergency Medicine

## 2015-04-11 ENCOUNTER — Encounter (HOSPITAL_COMMUNITY): Payer: Self-pay

## 2015-04-11 ENCOUNTER — Other Ambulatory Visit: Payer: Self-pay | Admitting: Nurse Practitioner

## 2015-04-11 DIAGNOSIS — F419 Anxiety disorder, unspecified: Secondary | ICD-10-CM | POA: Diagnosis not present

## 2015-04-11 DIAGNOSIS — Z791 Long term (current) use of non-steroidal anti-inflammatories (NSAID): Secondary | ICD-10-CM | POA: Diagnosis not present

## 2015-04-11 DIAGNOSIS — J45909 Unspecified asthma, uncomplicated: Secondary | ICD-10-CM | POA: Diagnosis not present

## 2015-04-11 DIAGNOSIS — Z8742 Personal history of other diseases of the female genital tract: Secondary | ICD-10-CM | POA: Diagnosis not present

## 2015-04-11 DIAGNOSIS — Z79899 Other long term (current) drug therapy: Secondary | ICD-10-CM | POA: Diagnosis not present

## 2015-04-11 DIAGNOSIS — Z7952 Long term (current) use of systemic steroids: Secondary | ICD-10-CM | POA: Diagnosis not present

## 2015-04-11 DIAGNOSIS — L539 Erythematous condition, unspecified: Secondary | ICD-10-CM | POA: Diagnosis present

## 2015-04-11 DIAGNOSIS — K219 Gastro-esophageal reflux disease without esophagitis: Secondary | ICD-10-CM | POA: Insufficient documentation

## 2015-04-11 DIAGNOSIS — L309 Dermatitis, unspecified: Secondary | ICD-10-CM | POA: Insufficient documentation

## 2015-04-11 DIAGNOSIS — Z792 Long term (current) use of antibiotics: Secondary | ICD-10-CM | POA: Diagnosis not present

## 2015-04-11 DIAGNOSIS — L03317 Cellulitis of buttock: Secondary | ICD-10-CM | POA: Insufficient documentation

## 2015-04-11 DIAGNOSIS — Z720 Tobacco use: Secondary | ICD-10-CM | POA: Diagnosis not present

## 2015-04-11 MED ORDER — DOXYCYCLINE HYCLATE 100 MG PO CAPS: 100.0000 mg | ORAL_CAPSULE | Freq: Two times a day (BID) | ORAL | Status: DC

## 2015-04-11 NOTE — ED Notes (Signed)
Pt has large red area to right buttock that she thinks could be a spider or insect bite.  Pt states she first noticed it on Wednesday

## 2015-04-11 NOTE — ED Provider Notes (Signed)
CSN: [id number]     Arrival date & time 04/11/15  0549 History   First MD Initiated Contact with Patient 04/11/15 [phone]     Chief Complaint  Patient presents with  . Insect Bite     (Consider location/radiation/quality/duration/timing/severity/associated sxs/prior Treatment) HPI   Patient reports 2 days ago they went swimming and afterwards she went to her brother's house and was sitting on the porch. Later when she was in the shower her husband noted that she looked like what appeared to be a mosquito bite on her right buttock. She states it did not itch. She states yesterday it "whelped up" and then this morning it woke her up from sleep. She states she has achiness of her leg. She is unsure if she is having fever but has had some chills. She denies nausea or vomiting. She reports she has had several MARSA  Infections mainly on her chest and had been on Tobi Bastos biotics 3 months and they would keep coming back. She states she was seen most recently in the ED and that an aquatic she was given work the best. She also reports she's had diarrhea for about 3 weeks about 6 times a day alternating between loose to watery without abdominal pain. She states that is starting to improve.   patient has a chronic history of dizziness and lightheadedness especially if she gets hot.   PCP Dr Gerda Diss  Past Medical History  Diagnosis Date  . Eczema 2008  . Asthma   . Ovarian cyst   . Asthma   . Anxiety   . GERD (gastroesophageal reflux disease)    Past Surgical History  Procedure Laterality Date  . Ovarian cyst removal    . Ovarian cyst removal  2011  . Tubal ligation     Family History  Problem Relation Age of Onset  . Diabetes Mother    History  Substance Use Topics  . Smoking status: Current Every Day Smoker -- 0.50 packs/day for 5 years    Types: Cigarettes    Start date: 02/11/2006  . Smokeless tobacco: Never Used  . Alcohol Use: 0.0 oz/week    0 Standard drinks or equivalent per week     Comment: occassionally   Stay at home mom  OB History    Gravida Para Term Preterm AB TAB SAB Ectopic Multiple Living   0 0 0 0 0 0 2     Review of Systems  All other systems reviewed and are negative.     Allergies  Review of patient's allergies indicates no known allergies.  Home Medications   Prior to Admission medications   Medication Sig Start Date End Date Taking? Authorizing Provider  albuterol (PROVENTIL HFA;VENTOLIN HFA) 108 (90 BASE) MCG/ACT inhaler Inhale 2 puffs into the lungs every 6 (six) hours as needed for wheezing. 11/08/14  Yes Campbell Riches, NP  ALPRAZolam Prudy Feeler) 1 MG tablet Take 0.5-1 tablets (0.5-1 mg total) by mouth 2 (two) times daily as needed for anxiety. 03/21/15  Yes Campbell Riches, NP  betamethasone dipropionate (DIPROLENE) 0.05 % cream Apply topically 2 (two) times daily. 11/08/14  Yes Campbell Riches, NP  escitalopram (LEXAPRO) 10 MG tablet Take 1 tablet (10 mg total) by mouth daily. 01/15/15 01/15/16 Yes Campbell Riches, NP  ibuprofen (ADVIL,MOTRIN) 600 MG tablet Take 1 tablet (600 mg total) by mouth 4 (four) times daily. 03/11/15  Yes Ivery Quale, PA-C  naproxen (NAPROSYN) 500 MG tablet Take 1 tablet (500  mg total) by mouth 2 (two) times daily with a meal. Prn migraine 11/27/14  Yes Campbell Riches, NP  ondansetron (ZOFRAN-ODT) 8 MG disintegrating tablet Take 1 tablet (8 mg total) by mouth every 8 (eight) hours as needed for nausea or vomiting. 12/09/14  Yes Campbell Riches, NP  pantoprazole (PROTONIX) 40 MG tablet Take 1 tablet (40 mg total) by mouth daily. 04/01/15  Yes Campbell Riches, NP  predniSONE (DELTASONE) 20 MG tablet 3 po qd x 3 d then 2 po qd x 3 d then 1 po qd x 3 d 04/07/15  Yes Campbell Riches, NP  topiramate (TOPAMAX) 50 MG tablet One po qhs for migraines 02/20/15  Yes Campbell Riches, NP  doxycycline (VIBRAMYCIN) 100 MG capsule Take 1 capsule (100 mg total) by mouth 2 (two) times daily. 04/11/15   Devoria Albe, MD    HYDROcodone-acetaminophen (NORCO/VICODIN) 5-325 MG per tablet Take 1-2 tablets by mouth every 4 (four) hours as needed. 03/11/15   Ivery Quale, PA-C  promethazine (PHENERGAN) 12.5 MG tablet Take 1 tablet (12.5 mg total) by mouth every 6 (six) hours as needed for nausea or vomiting. 08/23/14   Hope Orlene Och, NP  rizatriptan (MAXALT-MLT) 10 MG disintegrating tablet Take 1 tablet (10 mg total) by mouth as needed for migraine. May repeat in 2 hours if needed; max 2 per 24 hrs 11/27/14   Campbell Riches, NP  sulfamethoxazole-trimethoprim (BACTRIM DS,SEPTRA DS) 800-160 MG per tablet Take 1 tablet by mouth 2 (two) times daily. 12/09/14   Campbell Riches, NP  triamcinolone (KENALOG) 0.025 % ointment Apply 1 application topically 2 (two) times daily. 11/05/14   Ivery Quale, PA-C   BP 133/76 mmHg  Pulse 89  Temp(Src) 97.7 F (36.5 C) (Oral)  Resp 18  Ht 5\' 3"  (1.6 m)  Wt 160 lb (72.576 kg)  BMI 28.35 kg/m2  SpO2 99%  LMP 03/05/2015  Vital signs normal   Physical Exam  Constitutional: She is oriented to person, place, and time. She appears well-developed and well-nourished.  Non-toxic appearance. She does not appear ill. No distress.  HENT:  Head: Normocephalic and atraumatic.  Right Ear: External ear normal.  Left Ear: External ear normal.  Nose: Nose normal. No mucosal edema or rhinorrhea.  Mouth/Throat: Mucous membranes are normal. No dental abscesses or uvula swelling.  Eyes: Conjunctivae and EOM are normal.  Neck: Normal range of motion and full passive range of motion without pain. Neck supple.  Pulmonary/Chest: Effort normal. No respiratory distress. She has no rhonchi. She exhibits no crepitus.  Abdominal: Soft. Normal appearance and bowel sounds are normal.  Musculoskeletal: Normal range of motion.  Moves all extremities well.   Neurological: She is alert and oriented to person, place, and time. She has normal strength. No cranial nerve deficit.  Skin: Skin is warm, dry and intact.  No rash noted. There is erythema. No pallor.  Patient has a 6 x 10 area of redness on her right lateral buttock with a small pustule seen medially. There is diffuse mild induration of the skin without obvious abscess.  Psychiatric: She has a normal mood and affect. Her speech is normal and behavior is normal. Her mood appears not anxious.  Nursing note and vitals reviewed.      ED Course  Procedures (including critical care time)  Pt states the doxycycline worked best for her prior infections. We discussed using DIAL soap to help prevent MRSA infections. She was advised to eat yogurt while  taking antibiotics.    Labs Review Labs Reviewed - No data to display  Imaging Review No results found.   EKG Interpretation None      MDM   Final diagnoses:  Cellulitis of buttock, right    New Prescriptions   DOXYCYCLINE (VIBRAMYCIN) 100 MG CAPSULE    Take 1 capsule (100 mg total) by mouth 2 (two) times daily.    Plan discharge  Devoria Albe, MD, Concha Pyo, MD 04/11/15 [phone]

## 2015-04-11 NOTE — Discharge Instructions (Signed)
Use warm compresses on the area. Take the antibiotic until gone. Use DIAL bar soap to help prevent MRSA infection.  Recheck if you see red streaks, the red areas gets a lot bigger, you get a high fever, vomiting or not improving in the next 24-48 hours.  Eating yogurt while taking antibiotics may help prevent diarrhea.    Cellulitis Cellulitis is an infection of the skin and the tissue beneath it. The infected area is usually red and tender. Cellulitis occurs most often in the arms and lower legs.  CAUSES  Cellulitis is caused by bacteria that enter the skin through cracks or cuts in the skin. The most common types of bacteria that cause cellulitis are staphylococci and streptococci. SIGNS AND SYMPTOMS   Redness and warmth.  Swelling.  Tenderness or pain.  Fever. DIAGNOSIS  Your health care provider can usually determine what is wrong based on a physical exam. Blood tests may also be done. TREATMENT  Treatment usually involves taking an antibiotic medicine. HOME CARE INSTRUCTIONS   Take your antibiotic medicine as directed by your health care provider. Finish the antibiotic even if you start to feel better.  Keep the infected arm or leg elevated to reduce swelling.  Apply a warm cloth to the affected area up to 4 times per day to relieve pain.  Take medicines only as directed by your health care provider.  Keep all follow-up visits as directed by your health care provider. SEEK MEDICAL CARE IF:   You notice red streaks coming from the infected area.  Your red area gets larger or turns dark in color.  Your bone or joint underneath the infected area becomes painful after the skin has healed.  Your infection returns in the same area or another area.  You notice a swollen bump in the infected area.  You develop new symptoms.  You have a fever. SEEK IMMEDIATE MEDICAL CARE IF:   You feel very sleepy.  You develop vomiting or diarrhea.  You have a general ill feeling  (malaise) with muscle aches and pains. MAKE SURE YOU:   Understand these instructions.  Will watch your condition.  Will get help right away if you are not doing well or get worse. Document Released: 06/16/2005 Document Revised: 01/21/2014 Document Reviewed: 11/22/2011 [facility] Patient Information 2015 [place], [place]. This information is not intended to replace advice given to you by your health care provider. Make sure you discuss any questions you have with your health care provider.  Community-Associated MRSA CA-MRSA stands for community-associated methicillin-resistant Staphylococcus aureus. MRSA is a type of bacteria that is resistant to some common antibiotics. It can cause infections in the skin and many other places in the body. Staphylococcus aureus, often called "staph," is a bacteria that normally lives on the skin or in the nose. Staph on the surface of the skin or in the nose does not cause problems. However, if the staph enters the body through a cut, wound, or break in the skin, an infection can happen. Up until recently, infections with the MRSA type of staph mainly occurred in hospitals and other health care settings. There are now increasing problems with MRSA infections in the community as well. Infections with MRSA may be very serious or even life threatening. CA-MRSA is becoming more common. It is known to spread in crowded settings, in jails and prisons, and in situations where there is close skin-to-skin contact, such as during sporting events or in locker rooms. MRSA can be spread through shared  items, such as children's toys, razors, towels, or sports equipment.  CAUSES All staph, including MRSA, are normally harmless unless they enter the body through a scratch, cut, or wound, such as with surgery. All staph, including MRSA, can be spread from person-to-person by touching contaminated objects or through direct contact.  MRSA now causes illness in people who have not been  in hospitals or other health care facilities. Cases of MRSA diseases in the community have been associated with:  Recent antibiotic use.  Sharing contaminated towels or clothes.  Having active skin diseases.  Participating in contact sports.  Living in crowded settings.  Intravenous (IV) drug use.  Community-associated MRSA infections are usually skin infections, but may cause other severe illnesses.  Staph bacteria are one of the most common causes of skin infection. However, they are also a common cause of pneumonia, bone or joint infections, and bloodstream infections. DIAGNOSIS Diagnosis of MRSA is done by cultures of fluid samples that may come from:  Swabs taken from cuts or wounds in infected areas.  Nasal swabs.  Saliva or deep cough specimens from the lungs (sputum).  Urine.  Blood. Many people are "colonized" with MRSA but have no signs of infection. This means that people carry the MRSA germ on their skin or in their nose and may never develop MRSA infection.  TREATMENT  Treatment varies and is based on how serious, how deep, or how extensive the infection is. For example:  Some skin infections, such as a small boil or abscess, may be treated by draining yellowish-white fluid (pus) from the site of the infection.  Deeper or more widespread soft tissue infections are usually treated with surgery to drain pus and with antibiotic medicine given by vein or by mouth. This may be recommended even if you are pregnant.  Serious infections may require a hospital stay. If antibiotics are given, they may be needed for several weeks. PREVENTION Because many people are colonized with staph, including MRSA, preventing the spread of the bacteria from person-to-person is most important. The best way to prevent the spread of bacteria and other germs is through proper hand washing or by using alcohol-based hand disinfectants. The following are other ways to help prevent MRSA  infection within community settings.   Wash your hands frequently with soap and water for at least 15 seconds. Otherwise, use alcohol-based hand disinfectants when soap and water is not available.  Make sure people who live with you wash their hands often, too.  Do not share personal items. For example, avoid sharing razors and other personal hygiene items, towels, clothing, and athletic equipment.  Wash and dry your clothes and bedding at the warmest temperatures recommended on the labels.  Keep wounds covered. Pus from infected sores may contain MRSA and other bacteria. Keep cuts and abrasions clean and covered with germ-free (sterile), dry bandages until they are healed.  If you have a wound that appears infected, ask your caregiver if a culture for MRSA and other bacteria should be done.  If you are breastfeeding, talk to your caregiver about MRSA. You may be asked to temporarily stop breastfeeding. HOME CARE INSTRUCTIONS   Take your antibiotics as directed. Finish them even if you start to feel better.  Avoid close contact with those around you as much as possible. Do not use towels, razors, toothbrushes, bedding, or other items that will be used by others.  To fight the infection, follow your caregiver's instructions for wound care. Wash your hands before and  after changing your bandages.  If you have an intravascular device, such as a catheter, make sure you know how to care for it.  Be sure to tell any health care providers that you have MRSA so they are aware of your infection. SEEK IMMEDIATE MEDICAL CARE IF:  The infection appears to be getting worse. Signs include:  Increased warmth, redness, or tenderness around the wound site.  A red line that extends from the infection site.  A dark color in the area around the infection.  Wound drainage that is tan, yellow, or green.  A bad smell coming from the wound.  You feel sick to your stomach (nauseous) and throw up  (vomit) or cannot keep medicine down.  You have a fever.  Your baby is older than 3 months with a rectal temperature of 102F (38.9C) or higher.  Your baby is 7 months old or younger with a rectal temperature of 100.32F (38C) or higher.  You have difficulty breathing. MAKE SURE YOU:   Understand these instructions.  Will watch your condition.  Will get help right away if you are not doing well or get worse. Document Released: 12/10/2005 Document Revised: 01/21/2014 Document Reviewed: 12/10/2010 [facility] Patient Information 2015 [place], [place]. This information is not intended to replace advice given to you by your health care provider. Make sure you discuss any questions you have with your health care provider.

## 2015-04-12 ENCOUNTER — Emergency Department (HOSPITAL_COMMUNITY)
Admission: EM | Admit: 2015-04-12 | Discharge: 2015-04-12 | Disposition: A | Discharge status: Home / Self Care | Payer: 59 | Attending: Emergency Medicine | Admitting: Emergency Medicine

## 2015-04-12 ENCOUNTER — Other Ambulatory Visit: Payer: Self-pay | Admitting: Nurse Practitioner

## 2015-04-12 ENCOUNTER — Encounter (HOSPITAL_COMMUNITY): Payer: Self-pay | Admitting: *Deleted

## 2015-04-12 DIAGNOSIS — Z791 Long term (current) use of non-steroidal anti-inflammatories (NSAID): Secondary | ICD-10-CM | POA: Diagnosis not present

## 2015-04-12 DIAGNOSIS — R21 Rash and other nonspecific skin eruption: Secondary | ICD-10-CM | POA: Diagnosis present

## 2015-04-12 DIAGNOSIS — K219 Gastro-esophageal reflux disease without esophagitis: Secondary | ICD-10-CM | POA: Insufficient documentation

## 2015-04-12 DIAGNOSIS — Z8742 Personal history of other diseases of the female genital tract: Secondary | ICD-10-CM | POA: Insufficient documentation

## 2015-04-12 DIAGNOSIS — R110 Nausea: Secondary | ICD-10-CM | POA: Insufficient documentation

## 2015-04-12 DIAGNOSIS — Z792 Long term (current) use of antibiotics: Secondary | ICD-10-CM | POA: Diagnosis not present

## 2015-04-12 DIAGNOSIS — L03317 Cellulitis of buttock: Secondary | ICD-10-CM | POA: Diagnosis not present

## 2015-04-12 DIAGNOSIS — Z720 Tobacco use: Secondary | ICD-10-CM | POA: Insufficient documentation

## 2015-04-12 DIAGNOSIS — F419 Anxiety disorder, unspecified: Secondary | ICD-10-CM | POA: Diagnosis not present

## 2015-04-12 DIAGNOSIS — Z79899 Other long term (current) drug therapy: Secondary | ICD-10-CM | POA: Diagnosis not present

## 2015-04-12 DIAGNOSIS — J45909 Unspecified asthma, uncomplicated: Secondary | ICD-10-CM | POA: Insufficient documentation

## 2015-04-12 MED ORDER — ONDANSETRON HCL 4 MG PO TABS: 4.0000 mg | ORAL_TABLET | Freq: Once | ORAL | Status: DC

## 2015-04-12 MED ORDER — ONDANSETRON HCL 4 MG PO TABS
4.0000 mg | ORAL_TABLET | Freq: Once | ORAL | Status: AC
  Administered 2015-04-12: 4 mg via ORAL
  Filled 2015-04-12: qty 1

## 2015-04-12 MED ORDER — HYDROCODONE-ACETAMINOPHEN 5-325 MG PO TABS
2.0000 | ORAL_TABLET | Freq: Four times a day (QID) | ORAL | Status: DC | PRN
Start: 2015-04-12 — End: 2015-12-25

## 2015-04-12 MED ORDER — HYDROCODONE-ACETAMINOPHEN 5-325 MG PO TABS
2.0000 | ORAL_TABLET | Freq: Once | ORAL | Status: AC
  Administered 2015-04-12: 2 via ORAL
  Filled 2015-04-12: qty 2

## 2015-04-12 NOTE — ED Notes (Signed)
Insect bite on R buttock Wednesday which only a small pimple at that time.  Was seen here then and placed on antibix.   Hx of MRSA.  Site now has extended red border and is tender.  Has had subjective fever and chills.

## 2015-04-12 NOTE — ED Provider Notes (Signed)
CSN: [id number]     Arrival date & time 04/12/15  1615 History   First MD Initiated Contact with Patient 04/12/15 1629     Chief Complaint  Patient presents with  . Cellulitis     (Consider location/radiation/quality/duration/timing/severity/associated sxs/prior Treatment) Patient is a 24 y.o. female presenting with rash.  Rash Location:  Ano-genital Ano-genital rash location:  R buttock Quality: painful and redness   Pain details:    Quality:  Aching and sharp   Severity:  Mild   Onset quality:  Gradual   Duration:  2 days   Timing:  Constant   Progression:  Waxing and waning Severity:  Moderate Onset quality:  Gradual Duration:  2 days Timing:  Constant Associated symptoms: induration and nausea   Associated symptoms: no abdominal pain, no fatigue, no fever, no headaches, no tongue swelling, not vomiting and not wheezing     Past Medical History  Diagnosis Date  . Eczema 2008  . Asthma   . Ovarian cyst   . Asthma   . Anxiety   . GERD (gastroesophageal reflux disease)    Past Surgical History  Procedure Laterality Date  . Ovarian cyst removal    . Ovarian cyst removal  2011  . Tubal ligation     Family History  Problem Relation Age of Onset  . Diabetes Mother    History  Substance Use Topics  . Smoking status: Current Every Day Smoker -- 0.50 packs/day for 5 years    Types: Cigarettes    Start date: 02/11/2006  . Smokeless tobacco: Never Used  . Alcohol Use: 0.0 oz/week    0 Standard drinks or equivalent per week     Comment: occassionally   OB History    Gravida Para Term Preterm AB TAB SAB Ectopic Multiple Living   2 2 1  0 0 0 0 0 0 2     Review of Systems  Constitutional: Negative for fever and fatigue.  Respiratory: Negative for wheezing.   Gastrointestinal: Positive for nausea. Negative for vomiting and abdominal pain.  Skin: Positive for rash.  Neurological: Negative for headaches.  All other systems reviewed and are  negative.     Allergies  Review of patient's allergies indicates no known allergies.  Home Medications   Prior to Admission medications   Medication Sig Start Date End Date Taking? Authorizing Provider  albuterol (PROVENTIL HFA;VENTOLIN HFA) 108 (90 BASE) MCG/ACT inhaler Inhale 2 puffs into the lungs every 6 (six) hours as needed for wheezing. 11/08/14  Yes Campbell Riches, NP  ALPRAZolam Prudy Feeler) 1 MG tablet Take 0.5-1 tablets (0.5-1 mg total) by mouth 2 (two) times daily as needed for anxiety. 03/21/15  Yes Campbell Riches, NP  doxycycline (VIBRAMYCIN) 100 MG capsule Take 1 capsule (100 mg total) by mouth 2 (two) times daily. 04/11/15  Yes Devoria Albe, MD  escitalopram (LEXAPRO) 10 MG tablet Take 1 tablet (10 mg total) by mouth daily. 01/15/15 01/15/16 Yes Campbell Riches, NP  ibuprofen (ADVIL,MOTRIN) 600 MG tablet Take 1 tablet (600 mg total) by mouth 4 (four) times daily. 03/11/15  Yes Ivery Quale, PA-C  naproxen (NAPROSYN) 500 MG tablet Take 1 tablet (500 mg total) by mouth 2 (two) times daily with a meal. Prn migraine 11/27/14  Yes Campbell Riches, NP  pantoprazole (PROTONIX) 40 MG tablet Take 1 tablet (40 mg total) by mouth daily. 04/01/15  Yes Campbell Riches, NP  predniSONE (DELTASONE) 20 MG tablet 3 po qd x 3 d then  2 po qd x 3 d then 1 po qd x 3 d 04/07/15  Yes Campbell Riches, NP  promethazine (PHENERGAN) 12.5 MG tablet Take 1 tablet (12.5 mg total) by mouth every 6 (six) hours as needed for nausea or vomiting. 08/23/14  Yes Hope Orlene Och, NP  topiramate (TOPAMAX) 50 MG tablet One po qhs for migraines 02/20/15  Yes Campbell Riches, NP  betamethasone dipropionate (DIPROLENE) 0.05 % cream Apply topically 2 (two) times daily. Patient not taking: Reported on 04/12/2015 11/08/14   Campbell Riches, NP  HYDROcodone-acetaminophen (NORCO/VICODIN) 5-325 MG per tablet Take 2 tablets by mouth every 6 (six) hours as needed for moderate pain. 04/12/15   Marily Memos, MD  ondansetron (ZOFRAN) 4 MG  tablet Take 1 tablet (4 mg total) by mouth once. 04/12/15   Marily Memos, MD  ondansetron (ZOFRAN-ODT) 8 MG disintegrating tablet Take 1 tablet (8 mg total) by mouth every 8 (eight) hours as needed for nausea or vomiting. Patient not taking: Reported on 04/12/2015 12/09/14   Campbell Riches, NP  rizatriptan (MAXALT-MLT) 10 MG disintegrating tablet Take 1 tablet (10 mg total) by mouth as needed for migraine. May repeat in 2 hours if needed; max 2 per 24 hrs Patient not taking: Reported on 04/12/2015 11/27/14   Campbell Riches, NP  sulfamethoxazole-trimethoprim (BACTRIM DS,SEPTRA DS) 800-160 MG per tablet Take 1 tablet by mouth 2 (two) times daily. 12/09/14   Campbell Riches, NP  triamcinolone (KENALOG) 0.025 % ointment Apply 1 application topically 2 (two) times daily. Patient not taking: Reported on 04/12/2015 11/05/14   Ivery Quale, PA-C   BP 127/73 mmHg  Pulse 106  Temp(Src) 98.7 F (37.1 C) (Oral)  Resp 18  Ht  (1.6 m)  Wt 155 lb (70.308 kg)  BMI 27.46 kg/m2  SpO2 96%  LMP 03/05/2015 Physical Exam  Constitutional: She appears well-developed and well-nourished.  HENT:  Head: Normocephalic and atraumatic.  Neck: Normal range of motion.  Cardiovascular: Normal rate and regular rhythm.   Abdominal: Soft. She exhibits no distension. There is no tenderness.  Skin: Rash (7x4 cm area of erythema, induration and tenderness to right buttock, no fluctuance, drainage or streaking ) noted.  Nursing note and vitals reviewed.   ED Course  Procedures (including critical care time)  ULTRASOUND LIMITED SOFT TISSUE/ MUSCULOSKELETAL: Soft tissue right buttock Indication: swelling, erythema, pain Linear probe used to evaluate area of interest in two planes. Findings:  Soft tissue infection Performed by: Dr Clayborne Dana Images saved electronically   Labs Review Labs Reviewed - No data to display  Imaging Review No results found.   EKG Interpretation None      MDM   Final diagnoses:   Cellulitis of buttock    24 yo F w/ cellulitis on right buttock not improving since two doses of antibiotics so is here for further evaluation. No drainage, No obvious abscess on exam or ultrasound as documented above. Don't feel that she's had an appropriate course of outpatient antibiotics so I advised her to continue taking those. She has no signs or symptoms of worsening cellulitis or sepsis syndrome need for IV treatment at this time. She has had some nausea so gave her refill for Zofran and a short course of pain medication as it is very difficult for her to sit.  I have personally and contemperaneously reviewed labs and imaging and used in my decision making as above.   I feel the patient has had an appropriate workup for  their chief complaint at this time and likelihood of emergent condition existing is low. They have been counseled on decision, discharge, follow up and which symptoms necessitate immediate return to the emergency department. They or their family verbally stated understanding and agreement with plan and discharged in stable condition.      Marily Memos, MD 04/12/15 [phone]

## 2015-04-28 ENCOUNTER — Emergency Department (HOSPITAL_COMMUNITY)
Admission: EM | Admit: 2015-04-28 | Discharge: 2015-04-28 | Disposition: A | Discharge status: Home / Self Care | Payer: 59 | Attending: Emergency Medicine | Admitting: Emergency Medicine

## 2015-04-28 ENCOUNTER — Encounter (HOSPITAL_COMMUNITY): Payer: Self-pay

## 2015-04-28 DIAGNOSIS — Z8742 Personal history of other diseases of the female genital tract: Secondary | ICD-10-CM | POA: Insufficient documentation

## 2015-04-28 DIAGNOSIS — Z872 Personal history of diseases of the skin and subcutaneous tissue: Secondary | ICD-10-CM | POA: Diagnosis not present

## 2015-04-28 DIAGNOSIS — K219 Gastro-esophageal reflux disease without esophagitis: Secondary | ICD-10-CM | POA: Insufficient documentation

## 2015-04-28 DIAGNOSIS — J45909 Unspecified asthma, uncomplicated: Secondary | ICD-10-CM | POA: Insufficient documentation

## 2015-04-28 DIAGNOSIS — F419 Anxiety disorder, unspecified: Secondary | ICD-10-CM | POA: Insufficient documentation

## 2015-04-28 DIAGNOSIS — Z720 Tobacco use: Secondary | ICD-10-CM | POA: Diagnosis not present

## 2015-04-28 DIAGNOSIS — R21 Rash and other nonspecific skin eruption: Secondary | ICD-10-CM | POA: Diagnosis present

## 2015-04-28 DIAGNOSIS — Z792 Long term (current) use of antibiotics: Secondary | ICD-10-CM | POA: Diagnosis not present

## 2015-04-28 DIAGNOSIS — Z79899 Other long term (current) drug therapy: Secondary | ICD-10-CM | POA: Diagnosis not present

## 2015-04-28 DIAGNOSIS — Z791 Long term (current) use of non-steroidal anti-inflammatories (NSAID): Secondary | ICD-10-CM | POA: Insufficient documentation

## 2015-04-28 MED ORDER — PREDNISONE 50 MG PO TABS
60.0000 mg | ORAL_TABLET | Freq: Once | ORAL | Status: AC
  Administered 2015-04-28: 60 mg via ORAL
  Filled 2015-04-28 (×2): qty 1

## 2015-04-28 MED ORDER — PREDNISONE 10 MG PO TABS: ORAL_TABLET | ORAL | Status: DC

## 2015-04-28 MED ORDER — HYDROXYZINE HCL 25 MG PO TABS
25.0000 mg | ORAL_TABLET | Freq: Once | ORAL | Status: AC
  Administered 2015-04-28: 25 mg via ORAL
  Filled 2015-04-28: qty 1

## 2015-04-28 MED ORDER — HYDROXYZINE HCL 25 MG PO TABS: ORAL_TABLET | ORAL | Status: DC

## 2015-04-28 MED ORDER — TRIAMCINOLONE ACETONIDE 0.1 % EX CREA: 1.0000 "application " | TOPICAL_CREAM | Freq: Two times a day (BID) | CUTANEOUS | Status: DC

## 2015-04-28 NOTE — Discharge Instructions (Signed)
Please see Dr. Margo Aye, or the dermatologist of your choice for evaluation of this rash. Please use medications as prescribed until you are seen by the dermatologist. Vistaril may cause drowsiness, please use this medication with caution.

## 2015-04-28 NOTE — ED Provider Notes (Signed)
CSN: [id number]     Arrival date & time 04/28/15  1948 History   First MD Initiated Contact with Patient 04/28/15 2112     Chief Complaint  Patient presents with  . Rash     (Consider location/radiation/quality/duration/timing/severity/associated sxs/prior Treatment) HPI Comments: Patient is a 24 year old female who presents to the emergency department with a complaint of a peeling rash of both hands. The patient states that this is been going off and on for quite some time. It has been getting worse over the last 2 weeks. She is particularly concerned because a cousin had a similar rash and was diagnosed with lupus. The patient states that she has been treated with creams, but the rash keeps returning. She presents to the emergency department for assistance with the rash on her hands. She's not had any high fever. She has not used any new soaps or detergents. She does not wear gloves on a regular basis. No new chemicals to be reported. She complains of itching. This is particularly bad at night.  Patient is a 24 y.o. female presenting with rash. The history is provided by the patient.  Rash Location:  Hand Hand rash location:  L hand and R hand   Past Medical History  Diagnosis Date  . Eczema 2008  . Asthma   . Ovarian cyst   . Asthma   . Anxiety   . GERD (gastroesophageal reflux disease)    Past Surgical History  Procedure Laterality Date  . Ovarian cyst removal    . Ovarian cyst removal  2011  . Tubal ligation     Family History  Problem Relation Age of Onset  . Diabetes Mother    History  Substance Use Topics  . Smoking status: Current Every Day Smoker -- 0.50 packs/day for 5 years    Types: Cigarettes    Start date: 02/11/2006  . Smokeless tobacco: Never Used  . Alcohol Use: 0.0 oz/week    0 Standard drinks or equivalent per week     Comment: occassionally   OB History    Gravida Para Term Preterm AB TAB SAB Ectopic Multiple Living   2 2 1  0 0 0 0 0 0 2      Review of Systems  Skin: Positive for rash.  Psychiatric/Behavioral: The patient is nervous/anxious.   All other systems reviewed and are negative.     Allergies  Review of patient's allergies indicates no known allergies.  Home Medications   Prior to Admission medications   Medication Sig Start Date End Date Taking? Authorizing Provider  albuterol (PROVENTIL HFA;VENTOLIN HFA) 108 (90 BASE) MCG/ACT inhaler Inhale 2 puffs into the lungs every 6 (six) hours as needed for wheezing. 11/08/14   Campbell Riches, NP  ALPRAZolam Prudy Feeler) 1 MG tablet Take 0.5-1 tablets (0.5-1 mg total) by mouth 2 (two) times daily as needed for anxiety. 03/21/15   Campbell Riches, NP  betamethasone dipropionate (DIPROLENE) 0.05 % cream Apply topically 2 (two) times daily. Patient not taking: Reported on 04/12/2015 11/08/14   Campbell Riches, NP  doxycycline (VIBRAMYCIN) 100 MG capsule Take 1 capsule (100 mg total) by mouth 2 (two) times daily. 04/11/15   Devoria Albe, MD  escitalopram (LEXAPRO) 10 MG tablet TAKE ONE TABLET BY MOUTH ONE TIMES DAILY. 04/14/15   Campbell Riches, NP  HYDROcodone-acetaminophen (NORCO/VICODIN) 5-325 MG per tablet Take 2 tablets by mouth every 6 (six) hours as needed for moderate pain. 04/12/15   Marily Memos, MD  hydrOXYzine (  ATARAX/VISTARIL) 25 MG tablet 1 or 2 po q6h prn itching 04/28/15   Ivery Quale, PA-C  ibuprofen (ADVIL,MOTRIN) 600 MG tablet Take 1 tablet (600 mg total) by mouth 4 (four) times daily. 03/11/15   Ivery Quale, PA-C  naproxen (NAPROSYN) 500 MG tablet Take 1 tablet (500 mg total) by mouth 2 (two) times daily with a meal. Prn migraine 11/27/14   Campbell Riches, NP  ondansetron (ZOFRAN) 4 MG tablet Take 1 tablet (4 mg total) by mouth once. 04/12/15   Marily Memos, MD  ondansetron (ZOFRAN-ODT) 8 MG disintegrating tablet Take 1 tablet (8 mg total) by mouth every 8 (eight) hours as needed for nausea or vomiting. Patient not taking: Reported on 04/12/2015 12/09/14   Campbell Riches, NP  pantoprazole (PROTONIX) 40 MG tablet Take 1 tablet (40 mg total) by mouth daily. 04/01/15   Campbell Riches, NP  predniSONE (DELTASONE) 10 MG tablet 5,4,3,2,1 - take with food 04/28/15   Ivery Quale, PA-C  promethazine (PHENERGAN) 12.5 MG tablet Take 1 tablet (12.5 mg total) by mouth every 6 (six) hours as needed for nausea or vomiting. 08/23/14   Hope Orlene Och, NP  rizatriptan (MAXALT-MLT) 10 MG disintegrating tablet Take 1 tablet (10 mg total) by mouth as needed for migraine. May repeat in 2 hours if needed; max 2 per 24 hrs Patient not taking: Reported on 04/12/2015 11/27/14   Campbell Riches, NP  sulfamethoxazole-trimethoprim (BACTRIM DS,SEPTRA DS) 800-160 MG per tablet Take 1 tablet by mouth 2 (two) times daily. 12/09/14   Campbell Riches, NP  topiramate (TOPAMAX) 50 MG tablet One po qhs for migraines 02/20/15   Campbell Riches, NP  triamcinolone cream (KENALOG) 0.1 % Apply 1 application topically 2 (two) times daily. 04/28/15   Ivery Quale, PA-C   BP 129/62 mmHg  Pulse 101  Temp(Src) 98.6 F (37 C) (Oral)  Resp 24  Ht 5\' 3"  (1.6 m)  Wt 161 lb (73.029 kg)  BMI 28.53 kg/m2  SpO2 100%  LMP 04/05/2015 Physical Exam  Constitutional: She is oriented to person, place, and time. She appears well-developed and well-nourished.  Non-toxic appearance.  HENT:  Head: Normocephalic.  Right Ear: Tympanic membrane and external ear normal.  Left Ear: Tympanic membrane and external ear normal.  Eyes: EOM and lids are normal. Pupils are equal, round, and reactive to light.  Neck: Normal range of motion. Neck supple. Carotid bruit is not present.  Cardiovascular: Normal rate, regular rhythm, normal heart sounds, intact distal pulses and normal pulses.   Pulmonary/Chest: Breath sounds normal. No respiratory distress.  Abdominal: Soft. Bowel sounds are normal. There is no tenderness. There is no guarding.  Musculoskeletal: Normal range of motion.  Lymphadenopathy:       Head (right side):  No submandibular adenopathy present.       Head (left side): No submandibular adenopathy present.    She has no cervical adenopathy.  Neurological: She is alert and oriented to person, place, and time. She has normal strength. No cranial nerve deficit or sensory deficit.  Skin: Skin is warm and dry.  There is a peeling rash of the fingers, and the dorsum of the right and left hand. There are 1 or 2 open areas from the patient scratching and pulling skin. There is no rash noted of the palmar surface. There is no rash involving the Bicitra 7 area or forearm. The rash does not involve the lower extremities or any other areas.  Psychiatric: She has a  normal mood and affect. Her speech is normal.  Nursing note and vitals reviewed.   ED Course  Procedures (including critical care time) Labs Review Labs Reviewed - No data to display  Imaging Review No results found.   EKG Interpretation None      MDM  Patient has a scaling rash involving the right and left hand. There is no evidence of secondary infection, no red streaking appreciated. There is minimal swelling of the hand and fingers. There is good range of motion of the fingers, wrists, and elbows of the right and left upper extremity.  The patient will be referred to Dr. Nita Sells, dermatology for additional evaluation and management of this particular problem. Prescriptions given for prednisone, Vistaril, and Kenalog cream.    Final diagnoses:  Rash    **I have reviewed nursing notes, vital signs, and all appropriate lab and imaging results for this patient.Ivery Quale, PA-C 04/28/15 2209  Vanetta Mulders, MD 05/01/15 [phone]

## 2015-04-28 NOTE — ED Notes (Signed)
Pt has peeling skin to fingers of both hands x 2 weeks.

## 2015-04-29 ENCOUNTER — Telehealth: Payer: Self-pay | Admitting: Family Medicine

## 2015-04-29 ENCOUNTER — Other Ambulatory Visit: Payer: Self-pay | Admitting: *Deleted

## 2015-04-29 MED ORDER — IVERMECTIN 0.5 % EX LOTN: TOPICAL_LOTION | CUTANEOUS | Status: DC

## 2015-04-29 NOTE — Telephone Encounter (Signed)
Med sent to pharm. Pt notified.  

## 2015-04-29 NOTE — Telephone Encounter (Signed)
Pt has been exposed to lice and would like for something to be called in if possible.  [employer]

## 2015-05-16 ENCOUNTER — Telehealth: Payer: Self-pay | Admitting: Family Medicine

## 2015-05-16 NOTE — Telephone Encounter (Signed)
[facility] 05/16/15 

## 2015-05-16 NOTE — Telephone Encounter (Signed)
We have to be careful about over prescribing Prednisone. She has had it several times over the past few months. Is she ready to see dermatologist?

## 2015-05-16 NOTE — Telephone Encounter (Signed)
Spoke with and informed patient per Karen Shields that we must be careful about over prescribing prednisone since she has had it several times over the past few months. Patient verbalized understanding and stated that she has an appointment with Dr.Hall, dermatologist next month and that her hands have not broken out in a few days since stopping hydrocortisone tablets but she was requesting medication because she felt like it helped.

## 2015-05-16 NOTE — Telephone Encounter (Signed)
Pt called stating that she was prescribed hydrocortizone tablets in the hospital for her hands breaking out and it has worked wonders. Pt is about to take the last one and was wanting to know if dr. Brett Canales can prescribe them for her.   [employer]

## 2015-06-13 ENCOUNTER — Ambulatory Visit (INDEPENDENT_AMBULATORY_CARE_PROVIDER_SITE_OTHER): Payer: 59 | Admitting: Family Medicine

## 2015-06-13 ENCOUNTER — Encounter: Payer: Self-pay | Admitting: Family Medicine

## 2015-06-13 VITALS — Temp 97.9°F | Ht 63.0 in | Wt 162.2 lb

## 2015-06-13 DIAGNOSIS — R300 Dysuria: Secondary | ICD-10-CM | POA: Diagnosis not present

## 2015-06-13 LAB — POCT URINALYSIS DIPSTICK
Spec Grav, UA: <=1.005
pH, UA: 8

## 2015-06-13 MED ORDER — ALPRAZOLAM 1 MG PO TABS: 0.5000 mg | ORAL_TABLET | Freq: Two times a day (BID) | ORAL | Status: DC | PRN

## 2015-06-13 MED ORDER — CEFPROZIL 500 MG PO TABS: 500.0000 mg | ORAL_TABLET | Freq: Two times a day (BID) | ORAL | Status: AC

## 2015-06-13 MED ORDER — PHENAZOPYRIDINE HCL 200 MG PO TABS: 200.0000 mg | ORAL_TABLET | Freq: Three times a day (TID) | ORAL | Status: DC | PRN

## 2015-06-13 NOTE — Progress Notes (Signed)
   Subjective:    Patient ID: Karen Shields, female    DOB: 1991-08-01, 24 y.o.   MRN: [id number]  Urinary Tract Infection  This is a new problem. The current episode started in the past 7 days. The problem occurs intermittently. The problem has been unchanged. The quality of the pain is described as burning. The pain is moderate. There has been no fever. Associated symptoms comments: Foul smelling urine, low back pain. She has tried nothing for the symptoms. The treatment provided no relief.   PMH benign.   Review of Systems Relates dysuria urinary frequency denies vaginal discharge denies abdominal pain denies sweats chills vomiting. No cough wheezing or difficulty breathing.    Objective:   Physical Exam Lungs clear heart regular pulse normal abdomen soft no guarding rebound or tenderness no flank pain       Assessment & Plan:  UA with WBCs Culture taken Antibiotics prescribed Warning signs discussed Follow-up if ongoing trouble

## 2015-06-16 ENCOUNTER — Encounter: Payer: Self-pay | Admitting: Family Medicine

## 2015-06-16 LAB — URINE CULTURE

## 2015-07-09 ENCOUNTER — Ambulatory Visit (INDEPENDENT_AMBULATORY_CARE_PROVIDER_SITE_OTHER): Payer: 59 | Admitting: Nurse Practitioner

## 2015-07-09 ENCOUNTER — Encounter: Payer: Self-pay | Admitting: Nurse Practitioner

## 2015-07-09 ENCOUNTER — Telehealth: Payer: Self-pay | Admitting: Family Medicine

## 2015-07-09 VITALS — BP 128/86 | Temp 98.3°F | Ht 64.0 in | Wt 161.0 lb

## 2015-07-09 DIAGNOSIS — Z0000 Encounter for general adult medical examination without abnormal findings: Secondary | ICD-10-CM

## 2015-07-09 DIAGNOSIS — B9689 Other specified bacterial agents as the cause of diseases classified elsewhere: Secondary | ICD-10-CM

## 2015-07-09 DIAGNOSIS — Z113 Encounter for screening for infections with a predominantly sexual mode of transmission: Secondary | ICD-10-CM

## 2015-07-09 DIAGNOSIS — Z01419 Encounter for gynecological examination (general) (routine) without abnormal findings: Secondary | ICD-10-CM

## 2015-07-09 DIAGNOSIS — R5383 Other fatigue: Secondary | ICD-10-CM

## 2015-07-09 DIAGNOSIS — J208 Acute bronchitis due to other specified organisms: Secondary | ICD-10-CM

## 2015-07-09 DIAGNOSIS — J00 Acute nasopharyngitis [common cold]: Secondary | ICD-10-CM | POA: Diagnosis not present

## 2015-07-09 DIAGNOSIS — Z124 Encounter for screening for malignant neoplasm of cervix: Secondary | ICD-10-CM

## 2015-07-09 MED ORDER — AMOXICILLIN-POT CLAVULANATE 875-125 MG PO TABS: 1.0000 | ORAL_TABLET | Freq: Two times a day (BID) | ORAL | Status: DC

## 2015-07-09 MED ORDER — ONDANSETRON 8 MG PO TBDP: 8.0000 mg | ORAL_TABLET | Freq: Three times a day (TID) | ORAL | Status: DC | PRN

## 2015-07-09 NOTE — Telephone Encounter (Signed)
Pt is needing a refill on [facility]fran [facility]

## 2015-07-10 ENCOUNTER — Other Ambulatory Visit: Payer: Self-pay | Admitting: Nurse Practitioner

## 2015-07-10 LAB — BASIC METABOLIC PANEL
BUN/Creatinine Ratio: 8 (ref 8–20)
BUN: 6 mg/dL (ref 6–20)
CO2: 21 mmol/L (ref 18–29)
CREATININE: 0.79 mg/dL (ref 0.57–1.00)
Calcium: 9.6 mg/dL (ref 8.7–10.2)
Chloride: 102 mmol/L (ref 97–106)
GFR calc Af Amer: 121 mL/min/{1.73_m2} (ref >59)
GFR calc non Af Amer: 105 mL/min/{1.73_m2} (ref >59)
Glucose: 83 mg/dL (ref 65–99)
POTASSIUM: 4.2 mmol/L (ref 3.5–5.2)
Sodium: 140 mmol/L (ref 136–144)

## 2015-07-10 LAB — CBC WITH DIFFERENTIAL/PLATELET
BASOS ABS: 0 10*3/uL (ref 0.0–0.2)
Basos: 0 %
EOS (ABSOLUTE): 0.2 10*3/uL (ref 0.0–0.4)
Eos: 2 %
Hematocrit: 39 % (ref 34.0–46.6)
Hemoglobin: 13.3 g/dL (ref 11.1–15.9)
IMMATURE GRANULOCYTES: 0 %
Immature Grans (Abs): 0 10*3/uL (ref 0.0–0.1)
LYMPHS ABS: 2.5 10*3/uL (ref 0.7–3.1)
Lymphs: 31 %
MCH: 29.6 pg (ref 26.6–33.0)
MCHC: 34.1 g/dL (ref 31.5–35.7)
MCV: 87 fL (ref 79–97)
MONOS ABS: 0.6 10*3/uL (ref 0.1–0.9)
Monocytes: 8 %
NEUTROS PCT: 59 %
Neutrophils Absolute: 4.6 10*3/uL (ref 1.4–7.0)
PLATELETS: 364 10*3/uL (ref 150–379)
RBC: 4.5 x10E6/uL (ref 3.77–5.28)
RDW: 13.4 % (ref 12.3–15.4)
WBC: 7.9 10*3/uL (ref 3.4–10.8)

## 2015-07-10 LAB — HEPATIC FUNCTION PANEL
ALBUMIN: 4.7 g/dL (ref 3.5–5.5)
ALT: 12 IU/L (ref 0–32)
AST: 15 IU/L (ref 0–40)
Alkaline Phosphatase: 90 IU/L (ref 39–117)
Bilirubin Total: 0.3 mg/dL (ref 0.0–1.2)
Bilirubin, Direct: 0.07 mg/dL (ref 0.00–0.40)
TOTAL PROTEIN: 6.8 g/dL (ref 6.0–8.5)

## 2015-07-10 LAB — TSH: TSH: 0.421 u[IU]/mL — ABNORMAL LOW (ref 0.450–4.500)

## 2015-07-10 LAB — VITAMIN D 25 HYDROXY (VIT D DEFICIENCY, FRACTURES): VIT D 25 HYDROXY: 19.4 ng/mL — AB (ref 30.0–100.0)

## 2015-07-10 MED ORDER — VITAMIN D (ERGOCALCIFEROL) 1.25 MG (50000 UNIT) PO CAPS: 50000.0000 [IU] | ORAL_CAPSULE | ORAL | Status: DC

## 2015-07-11 ENCOUNTER — Encounter: Payer: Self-pay | Admitting: Nurse Practitioner

## 2015-07-11 LAB — PAP IG, CT-NG, RFX HPV ASCU
CHLAMYDIA, NUC. ACID AMP: NEGATIVE
GONOCOCCUS BY NUCLEIC ACID AMP: NEGATIVE
PAP Smear Comment: 0

## 2015-07-11 NOTE — Progress Notes (Signed)
Subjective:    Patient ID: Karen DubinKendall B Shields, female    DOB: 1990/10/10, 24 y.o.   MRN: [id number]  HPI presents for her wellness exam. Has had BTL. Married, same sexual partner. Regular cycles, normal flow. Regular dental exams. No regular activity. Cough for several days. No fever. C/o fatigue; admits to increased stress.    Review of Systems  Constitutional: Positive for fatigue. Negative for fever, activity change and appetite change.  HENT: Positive for congestion and postnasal drip. Negative for dental problem, ear pain, sinus pressure and sore throat.   Respiratory: Positive for cough. Negative for chest tightness, shortness of breath and wheezing.   Cardiovascular: Negative for chest pain.  Gastrointestinal: Negative for nausea, vomiting, abdominal pain, diarrhea, constipation and abdominal distention.  Genitourinary: Positive for vaginal discharge. Negative for dysuria, urgency, frequency, enuresis, difficulty urinating, genital sores, menstrual problem and pelvic pain.       Objective:   Physical Exam  Constitutional: She is oriented to person, place, and time. She appears well-developed. No distress.  HENT:  Right Ear: External ear normal.  Left Ear: External ear normal.  Mouth/Throat: Oropharynx is clear and moist.  TMs mild clear effusion; pharynx non erythematous; cloudy PND.   Neck: Normal range of motion. Neck supple. No tracheal deviation present. No thyromegaly present.  Mild anterior adenopathy.  Cardiovascular: Normal rate, regular rhythm and normal heart sounds.  Exam reveals no gallop.   No murmur heard. Pulmonary/Chest: Effort normal. No respiratory distress. She has no wheezes.  Scattered expiratory crackles; no tachypnea.  Abdominal: Soft. She exhibits no distension. There is no tenderness.  Genitourinary: Vagina normal and uterus normal. No vaginal discharge found.  External GU: no rashes or lesions. Vagina: minimal discharge; mucoid; clear to white.  Cervix normal in appearance; no CMT. Bimanual exam: no tenderness or obvious masses.  Musculoskeletal: She exhibits no edema.  Lymphadenopathy:    She has cervical adenopathy.  Neurological: She is alert and oriented to person, place, and time.  Skin: Skin is warm and dry. No rash noted.  Psychiatric: She has a normal mood and affect. Her behavior is normal.  Vitals reviewed. Breast exam: minimal fine nodularity; no masses; axillae no adenopathy.        Assessment & Plan:  Well woman exam - Plan: Pap IG, CT/NG w/ reflex HPV when ASC-U  Screening for cervical cancer - Plan: Pap IG, CT/NG w/ reflex HPV when ASC-U  Screen for STD (sexually transmitted disease) - Plan: Pap IG, CT/NG w/ reflex HPV when ASC-U  Acute bacterial bronchitis  Other fatigue - Plan: CBC with Differential/Platelet, Hepatic function panel, Basic metabolic panel, TSH, Vit D  25 hydroxy (rtn osteoporosis monitoring), TSH, TSH  Meds ordered this encounter  Medications  . pantoprazole (PROTONIX) 40 MG tablet    Sig:   . amoxicillin-clavulanate (AUGMENTIN) 875-125 MG tablet    Sig: Take 1 tablet by mouth 2 (two) times daily.    Dispense:  20 tablet    Refill:  0    Order Specific Question:  Supervising Provider    Answer:  Merlyn AlbertLUKING, WILLIAM S [[id number]]  . ondansetron (ZOFRAN-ODT) 8 MG disintegrating tablet    Sig: Take 1 tablet (8 mg total) by mouth every 8 (eight) hours as needed for nausea or vomiting.    Dispense:  20 tablet    Refill:  0    Order Specific Question:  Supervising Provider    Answer:  Merlyn AlbertLUKING, WILLIAM S [[id number]]   OTC meds as directed  for congestion and cough. Call back if worsens or persists. Discussed importance of smoking cessation. At end of visit, patient brought up multiple non urgent issues. Recommend separate office visit to discuss. Given checklist for adult ADHD; to bring to next visit. Patient agrees.

## 2015-08-11 ENCOUNTER — Telehealth: Payer: Self-pay | Admitting: Family Medicine

## 2015-08-11 NOTE — Telephone Encounter (Signed)
o v with carolyn wed, pt saw carolyn for wellness plus chronic problems just a few weeks ago

## 2015-08-11 NOTE — Telephone Encounter (Signed)
Patient says that when she wakes up in the morning, a lot of times she feels nauseous.  She cannot lose weight.  She says she feels like she can faint sometimes, and gets hot.  She says she was prescribed  Vitamin D because her recent labwork showed that this was low, along with her thyroid came back as abnormal.  She says she was told to recheck her thyroid in December, but she doesn't feel like she can wait this long.  She has a history of thyroid issues in her family and wants to know what the next step should be.  Also, she states she is panicky, and her xanax is not helping.

## 2015-08-12 NOTE — Telephone Encounter (Signed)
Left message to return call 

## 2015-08-12 NOTE — Telephone Encounter (Signed)
Discussed with patient. Patient transferred up front to schedule office visit.

## 2015-08-13 ENCOUNTER — Encounter: Payer: Self-pay | Admitting: Nurse Practitioner

## 2015-08-13 ENCOUNTER — Ambulatory Visit (INDEPENDENT_AMBULATORY_CARE_PROVIDER_SITE_OTHER): Payer: 59 | Admitting: Nurse Practitioner

## 2015-08-13 VITALS — BP 118/76 | Temp 98.0°F | Wt 162.0 lb

## 2015-08-13 DIAGNOSIS — R7989 Other specified abnormal findings of blood chemistry: Secondary | ICD-10-CM

## 2015-08-13 DIAGNOSIS — Z22322 Carrier or suspected carrier of Methicillin resistant Staphylococcus aureus: Secondary | ICD-10-CM | POA: Diagnosis not present

## 2015-08-13 DIAGNOSIS — R8290 Unspecified abnormal findings in urine: Secondary | ICD-10-CM

## 2015-08-13 DIAGNOSIS — R946 Abnormal results of thyroid function studies: Secondary | ICD-10-CM | POA: Diagnosis not present

## 2015-08-13 DIAGNOSIS — F419 Anxiety disorder, unspecified: Secondary | ICD-10-CM | POA: Diagnosis not present

## 2015-08-13 LAB — POCT URINALYSIS DIPSTICK
Glucose, UA: NEGATIVE
Ketones, UA: NEGATIVE
LEUKOCYTES UA: NEGATIVE
Nitrite, UA: POSITIVE
PROTEIN UA: 30
RBC UA: NEGATIVE
Spec Grav, UA: 1.02
UROBILINOGEN UA: NEGATIVE
pH, UA: 6

## 2015-08-13 MED ORDER — ESCITALOPRAM OXALATE 20 MG PO TABS: 20.0000 mg | ORAL_TABLET | Freq: Every day | ORAL | Status: DC

## 2015-08-13 MED ORDER — DOXYCYCLINE HYCLATE 100 MG PO TABS: 100.0000 mg | ORAL_TABLET | Freq: Two times a day (BID) | ORAL | Status: DC

## 2015-08-15 ENCOUNTER — Encounter: Payer: Self-pay | Admitting: Nurse Practitioner

## 2015-08-15 LAB — POCT UA - MICROSCOPIC ONLY
BACTERIA, U MICROSCOPIC: POSITIVE
RBC, URINE, MICROSCOPIC: NEGATIVE

## 2015-08-15 NOTE — Progress Notes (Signed)
Subjective:  Patient presents to discuss her most recent thyroid test. Also has a small sore lesion on the right abdominal area they came up within the past couple of days. Patient and a few members of her family have been treated for MRSA. No fever. Also complaints of urinary odor. No dysuria. This was not mentioned until the end of the visit that she had given a urine sample. Also patient had stopped her Lexapro 10 mg, felt like it was not helping her anxiety. Requesting an increase in the number of Xanax per month or an increase in dosage. States it does not seem to work very long.  Objective:   BP 118/76 mmHg  Temp(Src) 98 F (36.7 C)  Wt 162 lb (73.483 kg) NAD. Alert, oriented. Moderately anxious affect. Thoughts logical coherent and relevant. Dressed appropriately. Thyroid normal limit to palpation, no masses or tenderness noted. Lungs clear. Heart regular rate rhythm. Results for orders placed or performed in visit on 08/13/15                  POCT urinalysis dipstick  Result Value Ref Range   Color, UA dark    Clarity, UA cloudy    Glucose, UA neg    Bilirubin, UA +    Ketones, UA neg    Spec Grav, UA 1.020    Blood, UA neg    pH, UA 6.0    Protein, UA 30    Urobilinogen, UA negative    Nitrite, UA positive    Leukocytes, UA Negative Negative  POCT UA - Microscopic Only  Result Value Ref Range   WBC, Ur, HPF, POC rare    RBC, urine, microscopic neg    Bacteria, U Microscopic pos    Mucus, UA     Epithelial cells, urine per micros multiple    Crystals, Ur, HPF, POC     Casts, Ur, LPF, POC     Yeast, UA       Assessment:  Problem List Items Addressed This Visit      Other   Anxiety - Primary   Relevant Medications   escitalopram (LEXAPRO) 20 MG tablet   MRSA (methicillin resistant staph aureus) culture positive    Other Visit Diagnoses    Abnormal thyroid blood test        Abnormal urine odor        Relevant Orders    POCT urinalysis dipstick (Completed)    Urine culture (Completed)    POCT UA - Microscopic Only (Completed)       Plan:  Meds ordered this encounter  Medications  . doxycycline (VIBRA-TABS) 100 MG tablet    Sig: Take 1 tablet (100 mg total) by mouth 2 (two) times daily.    Dispense:  20 tablet    Refill:  2    Order Specific Question:  Supervising Provider    Answer:  Merlyn AlbertLUKING, WILLIAM S [[id number]]  . escitalopram (LEXAPRO) 20 MG tablet    Sig: Take 1 tablet (20 mg total) by mouth daily.    Dispense:  30 tablet    Refill:  2    Order Specific Question:  Supervising Provider    Answer:  Merlyn AlbertLUKING, WILLIAM S [[id number]]   Restart Lexapro 20 mg half tab by mouth daily for several days then increase to one by mouth daily. No increase in the number of Xanax per month, reviewed addiction potential. Also feel the patient's level anxiety needs daily medication. Urine culture pending. Reviewed recent TSH  results. Patient to repeat TSH in 3 months with possible further action needed at that time. Recommend follow-up on anxiety in 3 months, call back sooner if no improvement on Lexapro.

## 2015-08-16 LAB — URINE CULTURE

## 2015-08-18 ENCOUNTER — Other Ambulatory Visit: Payer: Self-pay | Admitting: Nurse Practitioner

## 2015-09-11 ENCOUNTER — Telehealth: Payer: Self-pay | Admitting: Nurse Practitioner

## 2015-09-11 NOTE — Telephone Encounter (Signed)
ALPRAZolam (XANAX) 1 MG tablet  ondansetron (ZOFRAN-ODT) 8 MG disintegrating tablet  Pt states she needs a refill on both these meds please, she has  A follow up with Eber Jonesarolyn in Jan   [facility]

## 2015-09-12 ENCOUNTER — Other Ambulatory Visit: Payer: Self-pay | Admitting: Nurse Practitioner

## 2015-09-12 MED ORDER — ONDANSETRON 8 MG PO TBDP: 8.0000 mg | ORAL_TABLET | Freq: Three times a day (TID) | ORAL | Status: DC | PRN

## 2015-09-12 MED ORDER — ALPRAZOLAM 1 MG PO TABS: 0.5000 mg | ORAL_TABLET | Freq: Two times a day (BID) | ORAL | Status: DC | PRN

## 2015-09-12 NOTE — Telephone Encounter (Signed)
Done. Follow up in January as planned for further refills.

## 2015-09-12 NOTE — Telephone Encounter (Signed)
Called patient and informed her per Nathaneil Canaryarolyn Hoskins,NP- Refills on Xanax, and Zofran sent in. Keep follow up in January as planned for further refills. Patient verbalized understanding.

## 2015-09-15 ENCOUNTER — Other Ambulatory Visit: Payer: Self-pay | Admitting: Nurse Practitioner

## 2015-10-02 ENCOUNTER — Encounter: Payer: Self-pay | Admitting: Nurse Practitioner

## 2015-10-02 ENCOUNTER — Ambulatory Visit (INDEPENDENT_AMBULATORY_CARE_PROVIDER_SITE_OTHER): Payer: Self-pay | Admitting: Nurse Practitioner

## 2015-10-02 VITALS — BP 128/76 | Temp 98.6°F | Wt 156.0 lb

## 2015-10-02 DIAGNOSIS — R1010 Upper abdominal pain, unspecified: Secondary | ICD-10-CM

## 2015-10-02 DIAGNOSIS — K219 Gastro-esophageal reflux disease without esophagitis: Secondary | ICD-10-CM

## 2015-10-02 DIAGNOSIS — K582 Mixed irritable bowel syndrome: Secondary | ICD-10-CM

## 2015-10-02 DIAGNOSIS — R110 Nausea: Secondary | ICD-10-CM

## 2015-10-02 DIAGNOSIS — T753XXA Motion sickness, initial encounter: Secondary | ICD-10-CM

## 2015-10-02 NOTE — Patient Instructions (Signed)
[employer] probiotic  Irritable Bowel Syndrome, Adult Irritable bowel syndrome (IBS) is not one specific disease. It is a group of symptoms that affects the organs responsible for digestion (gastrointestinal or GI tract).  To regulate how your GI tract works, your body sends signals back and forth between your intestines and your brain. If you have IBS, there may be a problem with these signals. As a result, your GI tract does not function normally. Your intestines may become more sensitive and overreact to certain things. This is especially true when you eat certain foods or when you are under stress.  There are four types of IBS. These may be determined based on the consistency of your stool:   IBS with diarrhea.   IBS with constipation.   Mixed IBS.   Unsubtyped IBS.  It is important to know which type of IBS you have. Some treatments are more likely to be helpful for certain types of IBS.  CAUSES  The exact cause of IBS is not known. RISK FACTORS You may have a higher risk of IBS if:  You are a woman.  You are younger than 25 years old.  You have a family history of IBS.  You have mental health problems.  You have had bacterial infection of your GI tract. SIGNS AND SYMPTOMS  Symptoms of IBS vary from person to person. The main symptom is abdominal pain or discomfort. Additional symptoms usually include one or more of the following:   Diarrhea, constipation, or both.   Abdominal swelling or bloating.   Feeling full or sick after eating a small or regular-size meal.   Frequent gas.   Mucus in the stool.   A feeling of having more stool left after a bowel movement.  Symptoms tend to come and go. They may be associated with stress, psychiatric conditions, or nothing at all.  DIAGNOSIS  There is no specific test to diagnose IBS. Your health care provider will make a diagnosis based on a physical exam, medical history, and your symptoms. You may have other tests to  rule out other conditions that may be causing your symptoms. These may include:   Blood tests.   X-rays.   CT scan.  Endoscopy and colonoscopy. This is a test in which your GI tract is viewed with a long, thin, flexible tube. TREATMENT There is no cure for IBS, but treatment can help relieve symptoms. IBS treatment often includes:   Changes to your diet, such as:  Eating more fiber.  Avoiding foods that cause symptoms.  Drinking more water.  Eating regular, medium-sized portioned meals.  Medicines. These may include:  Fiber supplements if you have constipation.  Medicine to control diarrhea (antidiarrheal medicines).  Medicine to help control muscle spasms in your GI tract (antispasmodic medicines).  Medicines to help with any mental health issues, such as antidepressants or tranquilizers.  Therapy.  Talk therapy may help with anxiety, depression, or other mental health issues that can make IBS symptoms worse.  Stress reduction.  Managing your stress can help keep symptoms under control. HOME CARE INSTRUCTIONS   Take medicines only as directed by your health care provider.  Eat a healthy diet.  Avoid foods and drinks with added sugar.  Include more whole grains, fruits, and vegetables gradually into your diet. This may be especially helpful if you have IBS with constipation.  Avoid any foods and drinks that make your symptoms worse. These may include dairy products and caffeinated or carbonated drinks.  Do not  eat large meals.  Drink enough fluid to keep your urine clear or pale yellow.  Exercise regularly. Ask your health care provider for recommendations of good activities for you.  Keep all follow-up visits as directed by your health care provider. This is important. SEEK MEDICAL CARE IF:   You have constant pain.  You have trouble or pain with swallowing.  You have worsening diarrhea. SEEK IMMEDIATE MEDICAL CARE IF:   You have severe and  worsening abdominal pain.   You have diarrhea and:   You have a rash, stiff neck, or severe headache.   You are irritable, sleepy, or difficult to awaken.   You are weak, dizzy, or extremely thirsty.   You have bright red blood in your stool or you have black tarry stools.   You have unusual abdominal swelling that is painful.   You vomit continuously.   You vomit blood (hematemesis).   You have both abdominal pain and a fever.    This information is not intended to replace advice given to you by your health care provider. Make sure you discuss any questions you have with your health care provider.   Document Released: 09/06/2005 Document Revised: 09/27/2014 Document Reviewed: 05/24/2014 Elsevier Interactive Patient Education 2016 [employer]. Food Choices for Gastroesophageal Reflux Disease, Adult When you have gastroesophageal reflux disease (GERD), the foods you eat and your eating habits are very important. Choosing the right foods can help ease the discomfort of GERD. WHAT GENERAL GUIDELINES DO I NEED TO FOLLOW?  Choose fruits, vegetables, whole grains, low-fat dairy products, and low-fat meat, fish, and poultry.  Limit fats such as oils, salad dressings, butter, nuts, and avocado.  Keep a food diary to identify foods that cause symptoms.  Avoid foods that cause reflux. These may be different for different people.  Eat frequent small meals instead of three large meals each day.  Eat your meals slowly, in a relaxed setting.  Limit fried foods.  Cook foods using methods other than frying.  Avoid drinking alcohol.  Avoid drinking large amounts of liquids with your meals.  Avoid bending over or lying down until 2-3 hours after eating. WHAT FOODS ARE NOT RECOMMENDED? The following are some foods and drinks that may worsen your symptoms: Vegetables Tomatoes. Tomato juice. Tomato and spaghetti sauce. Chili peppers. Onion and garlic.  Horseradish. Fruits Oranges, grapefruit, and lemon (fruit and juice). Meats High-fat meats, fish, and poultry. This includes hot dogs, ribs, ham, sausage, salami, and bacon. Dairy Whole milk and chocolate milk. Sour cream. Cream. Butter. Ice cream. Cream cheese.  Beverages Coffee and tea, with or without caffeine. Carbonated beverages or energy drinks. Condiments Hot sauce. Barbecue sauce.  Sweets/Desserts Chocolate and cocoa. Donuts. Peppermint and spearmint. Fats and Oils High-fat foods, including [place] fries and potato chips. Other Vinegar. Strong spices, such as black pepper, white pepper, red pepper, cayenne, curry powder, cloves, ginger, and chili powder. The items listed above may not be a complete list of foods and beverages to avoid. Contact your dietitian for more information.   This information is not intended to replace advice given to you by your health care provider. Make sure you discuss any questions you have with your health care provider.   Document Released: 09/06/2005 Document Revised: 09/27/2014 Document Reviewed: 07/11/2013 Elsevier Interactive Patient Education [employer]

## 2015-10-03 ENCOUNTER — Encounter: Payer: Self-pay | Admitting: Nurse Practitioner

## 2015-10-03 NOTE — Progress Notes (Signed)
Subjective:  Results for complaints of frequent nausea for the past several years. Occurs almost every day. Especially worse at nighttime, will wake up with nausea and "an upset stomach". Takes pantoprazole on a rare basis. Nausea seems to be worse also in the mornings. Sometimes will feel hunger but early satiety after a few bites of her meal with nausea. Rare vomiting. Occasional mid abdominal pain. Alternating constipation with "the runs". No change in the color of her stool. No chest pain. Symptoms unassociated with any particular foods. Drinks a large amount of caffeine. Drinks alcohol occasionally. Limited anti-inflammatories use. Smoker. Limited spicy foods or citrus. Occasional mid back pain. No overt reflux symptoms. Also identifies mild motion sickness occasionally when riding in a car.  Objective:   BP 128/76 mmHg  Temp(Src) 98.6 F (37 C) (Oral)  Wt 156 lb (70.761 kg) NAD. Alert, oriented. Mildly anxious affect. Lungs clear. Heart regular rate rhythm. Abdomen soft nondistended mildly obese with active bowel sounds 4. Mild epigastric area tenderness, exam otherwise benign. No obvious organomegaly or masses.  Assessment:  Problem List Items Addressed This Visit    None    Visit Diagnoses    Gastroesophageal reflux disease without esophagitis    -  Primary    Relevant Orders    Lipase    US Abdomen Limited RUQ    Pain of upper abdomen        Relevant Orders    Lipase    US Abdomen Limited RUQ    Nausea        Relevant Orders    Lipase    US Abdomen Limited RUQ    Irritable bowel syndrome with both constipation and diarrhea        Relevant Orders    Lipase    US Abdomen Limited RUQ    Motion sickness, initial encounter        Relevant Orders    Lipase    US Abdomen Limited RUQ      Plan: Restart pantoprazole daily. Given written and verbal information on factors affecting her reflux including caffeine and tobacco use. Limit alcohol use. Obtain abdominal ultrasound to  assess gallbladder. If this test is negative, plan HIDA scan. If this is normal, recommend referral to GI specialist for evaluation. Warning signs reviewed. Patient to call back sooner if any problems.

## 2015-10-06 ENCOUNTER — Ambulatory Visit (HOSPITAL_COMMUNITY)
Admission: RE | Admit: 2015-10-06 | Discharge: 2015-10-06 | Disposition: A | Discharge status: Home / Self Care | Payer: Medicaid Other | Source: Ambulatory Visit | Attending: Nurse Practitioner | Admitting: Nurse Practitioner

## 2015-10-06 DIAGNOSIS — R1010 Upper abdominal pain, unspecified: Secondary | ICD-10-CM | POA: Insufficient documentation

## 2015-10-06 DIAGNOSIS — K8020 Calculus of gallbladder without cholecystitis without obstruction: Secondary | ICD-10-CM | POA: Diagnosis not present

## 2015-10-06 DIAGNOSIS — R110 Nausea: Secondary | ICD-10-CM | POA: Diagnosis not present

## 2015-10-06 DIAGNOSIS — G8929 Other chronic pain: Secondary | ICD-10-CM | POA: Diagnosis not present

## 2015-10-06 DIAGNOSIS — K219 Gastro-esophageal reflux disease without esophagitis: Secondary | ICD-10-CM | POA: Insufficient documentation

## 2015-10-06 DIAGNOSIS — K582 Mixed irritable bowel syndrome: Secondary | ICD-10-CM | POA: Insufficient documentation

## 2015-10-07 ENCOUNTER — Telehealth: Payer: Self-pay | Admitting: Family Medicine

## 2015-10-07 MED ORDER — ONDANSETRON 8 MG PO TBDP: 8.0000 mg | ORAL_TABLET | Freq: Three times a day (TID) | ORAL | Status: DC | PRN

## 2015-10-07 NOTE — Telephone Encounter (Signed)
Ok may ref zofran

## 2015-10-07 NOTE — Telephone Encounter (Signed)
Rx sent electronically to pharmacy. Patient notified. 

## 2015-10-07 NOTE — Telephone Encounter (Signed)
HIDA Scan scheduled at [facility] 10/09/15 at 8 am be there at 7:45am. Nothing to eat or drink after midnight and no pain medicine after midnight.Marland Kitchen

## 2015-10-07 NOTE — Telephone Encounter (Signed)
Karen Shields,   Pt is wanting to talk to you about the HIDA scan you are recommending and any  Other details about her abd issues she has. She would like for you to please call  Her first thing in the morning if you can

## 2015-10-07 NOTE — Telephone Encounter (Signed)
Explained need for HIDA scan to patient. Patient advised of time and date of HIDA Scan- Patient states she needs a refill of her zofran

## 2015-10-07 NOTE — Addendum Note (Signed)
Addended by: Margaretha Sheffield on: 10/07/2015 09:01 AM   Modules accepted: Orders

## 2015-10-09 ENCOUNTER — Encounter (HOSPITAL_COMMUNITY): Payer: Self-pay

## 2015-10-09 ENCOUNTER — Encounter (HOSPITAL_COMMUNITY)
Admission: RE | Admit: 2015-10-09 | Discharge: 2015-10-09 | Disposition: A | Discharge status: Home / Self Care | Payer: Medicaid Other | Source: Ambulatory Visit | Attending: Nurse Practitioner | Admitting: Nurse Practitioner

## 2015-10-09 DIAGNOSIS — R110 Nausea: Secondary | ICD-10-CM | POA: Insufficient documentation

## 2015-10-09 DIAGNOSIS — R1010 Upper abdominal pain, unspecified: Secondary | ICD-10-CM | POA: Insufficient documentation

## 2015-10-09 MED ORDER — TECHNETIUM TC 99M MEBROFENIN IV KIT
5.0000 | PACK | Freq: Once | INTRAVENOUS | Status: AC | PRN
  Administered 2015-10-09: 5 via INTRAVENOUS

## 2015-10-10 NOTE — Addendum Note (Signed)
Addended by: Metro Kung on: 10/10/2015 02:08 PM   Modules accepted: Orders

## 2015-10-13 ENCOUNTER — Encounter: Payer: Self-pay | Admitting: Family Medicine

## 2015-10-14 ENCOUNTER — Other Ambulatory Visit: Payer: Self-pay | Admitting: Family Medicine

## 2015-10-14 ENCOUNTER — Telehealth: Payer: Self-pay | Admitting: Family Medicine

## 2015-10-14 ENCOUNTER — Other Ambulatory Visit: Payer: Self-pay | Admitting: Nurse Practitioner

## 2015-10-14 MED ORDER — TOPIRAMATE 50 MG PO TABS: ORAL_TABLET | ORAL | Status: DC

## 2015-10-14 MED ORDER — ONDANSETRON 8 MG PO TBDP: 8.0000 mg | ORAL_TABLET | Freq: Three times a day (TID) | ORAL | Status: DC | PRN

## 2015-10-14 MED ORDER — RIZATRIPTAN BENZOATE 10 MG PO TBDP: 10.0000 mg | ORAL_TABLET | ORAL | Status: DC | PRN

## 2015-10-14 MED ORDER — ALPRAZOLAM 1 MG PO TABS: 0.5000 mg | ORAL_TABLET | Freq: Two times a day (BID) | ORAL | Status: DC | PRN

## 2015-10-14 MED ORDER — ESCITALOPRAM OXALATE 20 MG PO TABS: 20.0000 mg | ORAL_TABLET | Freq: Every day | ORAL | Status: DC

## 2015-10-14 MED ORDER — PANTOPRAZOLE SODIUM 40 MG PO TBEC: DELAYED_RELEASE_TABLET | ORAL | Status: DC

## 2015-10-14 NOTE — Telephone Encounter (Signed)
pk plus 2 monthly ref

## 2015-10-14 NOTE — Telephone Encounter (Signed)
I have refilled Xanax. Also, I just received request from health department for her meds so maybe she can get them for free. I recommend GI consultation; perhaps they can help with that as well. If not, we can try to get this done once she gets some insurance. If she gets worse, recommend she go to ED.

## 2015-10-14 NOTE — Telephone Encounter (Signed)
Pt called stating that due to her not having insurance she is unable to get her medications. Pt only has one antidepressant left and is worried about not having them. Pt also states that she has been really sick and nauseous. Pt is unsure of what to do about not being able to get her medications. Pt would also like to speak with a nurse.

## 2015-10-14 NOTE — Telephone Encounter (Signed)
Called patient and informed her per Nathaneil Canary- Refilled Xanax. Also,  just received request from health department for her meds so maybe she can get them for free. Sherie Don, NP recommend GI consultation; perhaps they can help with that as well. If not, we can try to get this done once she gets some insurance. If she gets worse, recommend she go to ED. Patient verbalized understanding.

## 2015-10-14 NOTE — Telephone Encounter (Signed)
Spoke with patient and patient stated that she has been experiencing nausea, decreased appetite, faint feeling when putting hands above her head, and migraine headaches even after taking Topamax. Patient stressed that she did not have insurance and she is concerned of all the test she is having and stressed about how you can pay for her medications. Patient is also requesting a refill on her Xanax. Please advise?

## 2015-10-20 ENCOUNTER — Encounter: Payer: Self-pay | Admitting: Internal Medicine

## 2015-10-29 ENCOUNTER — Ambulatory Visit: Payer: Self-pay | Admitting: Gastroenterology

## 2015-11-04 ENCOUNTER — Encounter: Payer: Self-pay | Admitting: Gastroenterology

## 2015-11-04 ENCOUNTER — Ambulatory Visit (INDEPENDENT_AMBULATORY_CARE_PROVIDER_SITE_OTHER): Payer: Self-pay | Admitting: Gastroenterology

## 2015-11-04 ENCOUNTER — Other Ambulatory Visit: Payer: Self-pay

## 2015-11-04 VITALS — BP 134/74 | HR 91 | Temp 97.3°F | Ht 63.0 in | Wt 152.8 lb

## 2015-11-04 DIAGNOSIS — K8050 Calculus of bile duct without cholangitis or cholecystitis without obstruction: Secondary | ICD-10-CM

## 2015-11-04 DIAGNOSIS — K219 Gastro-esophageal reflux disease without esophagitis: Secondary | ICD-10-CM | POA: Insufficient documentation

## 2015-11-04 DIAGNOSIS — K8020 Calculus of gallbladder without cholecystitis without obstruction: Secondary | ICD-10-CM | POA: Insufficient documentation

## 2015-11-04 DIAGNOSIS — R6881 Early satiety: Secondary | ICD-10-CM | POA: Insufficient documentation

## 2015-11-04 DIAGNOSIS — R110 Nausea: Secondary | ICD-10-CM

## 2015-11-04 MED ORDER — OMEPRAZOLE 20 MG PO CPDR: 20.0000 mg | DELAYED_RELEASE_CAPSULE | Freq: Every day | ORAL | Status: DC

## 2015-11-04 NOTE — Progress Notes (Signed)
Primary Care Physician:  Lubertha South, MD  Primary Gastroenterologist:  Roetta Sessions, MD   Chief Complaint  Patient presents with  . Nausea  . Emesis    HPI:  Karen Shields is a 25 y.o. female here for further evaluation of nausea at the recommendation of her PCP. She has several year history of frequent nausea with intermittent vomiting. Wakes up nauseated at least 4 times weekly. Takes Zofran about every other morning. Nausea associated with cooking. Often loses her appetite while preparing her meal. States she used to weigh 165 pounds. 152 pounds today. Also notes feeling lightheaded when she does anything that requires raising her hands above her head. She gets car sick a lot. When this occurs she has the sensation that her neck is on fire. This is been occurring since she was a young child. Complains of heartburn, indigestion. Some early satiety. No dysphagia. Bowel movements fluctuate between constipation and diarrhea, about half and half of each. No melena or rectal bleeding. Regular menses. Status post tubal ligation.  Previously has used pantoprazole on an intermittent basis without much relief. Recent abdominal ultrasound showed cholelithiasis but no evidence of acute cholecystitis. HIDA scan unremarkable. Labs as outlined below, unremarkable.  Patient reports having chronic nausea for years. However she started on Topamax about 7 months and she identifies symptoms are worse since that time. Denies taking nsaids regularly at this point.  Current Outpatient Prescriptions  Medication Sig Dispense Refill  . albuterol (PROVENTIL HFA;VENTOLIN HFA) 108 (90 BASE) MCG/ACT inhaler Inhale 2 puffs into the lungs every 6 (six) hours as needed for wheezing. 18 g 5  . ALPRAZolam (XANAX) 1 MG tablet Take 0.5-1 tablets (0.5-1 mg total) by mouth 2 (two) times daily as needed for anxiety. 60 tablet 0  . betamethasone dipropionate (DIPROLENE) 0.05 % cream Apply topically 2 (two) times daily. 30 g 0   . escitalopram (LEXAPRO) 20 MG tablet Take 1 tablet (20 mg total) by mouth daily. 90 tablet 3  . HYDROcodone-acetaminophen (NORCO/VICODIN) 5-325 MG per tablet Take 2 tablets by mouth every 6 (six) hours as needed for moderate pain. 10 tablet 0  . ibuprofen (ADVIL,MOTRIN) 600 MG tablet Take 1 tablet (600 mg total) by mouth 4 (four) times daily. 30 tablet 0  . naproxen (NAPROSYN) 500 MG tablet Take 1 tablet (500 mg total) by mouth 2 (two) times daily with a meal. Prn migraine 30 tablet 0  . ondansetron (ZOFRAN-ODT) 8 MG disintegrating tablet Take 1 tablet (8 mg total) by mouth every 8 (eight) hours as needed for nausea or vomiting. 60 tablet 3  . topiramate (TOPAMAX) 50 MG tablet TAKE 1 TABLET BY MOUTH AT BEDTIME FOR MIGRAINES. 90 tablet 3   No current facility-administered medications for this visit.    Allergies as of 11/04/2015 - Review Complete 11/04/2015  Allergen Reaction Noted  . Latex Dermatitis 11/04/2015    Past Medical History  Diagnosis Date  . Eczema 2008  . Asthma   . Ovarian cyst   . Asthma   . Anxiety   . GERD (gastroesophageal reflux disease)     Past Surgical History  Procedure Laterality Date  . Ovarian cyst removal    . Ovarian cyst removal  2011  . Tubal ligation      Family History  Problem Relation Age of Onset  . Diabetes Mother   . Colon cancer Neg Hx   . Crohn's disease Neg Hx   . Celiac disease Neg Hx     Social  History   Social History  . Marital Status: Married    Spouse Name: N/A  . Number of Children: 2  . Years of Education: N/A   Occupational History  . Not on file.   Social History Main Topics  . Smoking status: Current Every Day Smoker -- 0.50 packs/day for 5 years    Types: Cigarettes    Start date: 02/11/2006  . Smokeless tobacco: Never Used  . Alcohol Use: 0.0 oz/week    0 Standard drinks or equivalent per week     Comment: occassionally  . Drug Use: No  . Sexual Activity:    Partners: Male    Birth Control/  Protection: Surgical   Other Topics Concern  . Not on file   Social History Narrative   ** Merged History Encounter **    Four daughters between herself and husband, two biological.       ROS:  General: see hpi Eyes: Negative for vision changes.  ENT: Negative for hoarseness, difficulty swallowing , nasal congestion. CV: Negative for chest pain, angina, palpitations, dyspnea on exertion, peripheral edema.  Respiratory: Negative for dyspnea at rest, dyspnea on exertion, cough, sputum, wheezing.  GI: See history of present illness. GU:  Negative for dysuria, hematuria, urinary incontinence, urinary frequency, nocturnal urination.  MS: Negative for joint pain, low back pain.  Derm: Negative for rash or itching.  Neuro: Negative for weakness, abnormal sensation, seizure, frequent headaches, memory loss, confusion.  Psych: Negative for anxiety, depression, suicidal ideation, hallucinations.  Endo: Negative for unusual weight change.  Heme: Negative for bruising or bleeding. Allergy: Negative for rash or hives.    Physical Examination:  BP 134/74 mmHg  Pulse 91  Temp(Src) 97.3 F (36.3 C)  Ht  (1.6 m)  Wt 152 lb 12.8 oz (69.31 kg)  BMI 27.07 kg/m2  LMP 10/22/2015 (Approximate)   General: Well-nourished, well-developed in no acute distress.  Head: Normocephalic, atraumatic.   Eyes: Conjunctiva pink, no icterus. Mouth: Oropharyngeal mucosa moist and pink , no lesions erythema or exudate. Neck: Supple without thyromegaly, masses, or lymphadenopathy.  Lungs: Clear to auscultation bilaterally.  Heart: Regular rate and rhythm, no murmurs rubs or gallops.  Abdomen: Bowel sounds are normal, nontender, nondistended, no hepatosplenomegaly or masses, no abdominal bruits or    hernia , no rebound or guarding.   Rectal: not performed Extremities: No lower extremity edema. No clubbing or deformities.  Neuro: Alert and oriented x 4 , grossly normal neurologically.  Skin: Warm and  dry, no rash or jaundice.   Psych: Alert and cooperative, normal mood and affect.  Labs: Lab Results  Component Value Date   WBC 7.9 07/09/2015   HGB 14.5 08/22/2014   HCT 39.0 07/09/2015   MCV 87 07/09/2015   PLT 364 07/09/2015   Lab Results  Component Value Date   CREATININE 0.79 07/09/2015   BUN 6 07/09/2015   NA 140 07/09/2015   K 4.2 07/09/2015   CL 102 07/09/2015   CO2 21 07/09/2015   Lab Results  Component Value Date   ALT 12 07/09/2015   AST 15 07/09/2015   ALKPHOS 90 07/09/2015   BILITOT 0.3 07/09/2015   Lab Results  Component Value Date   TSH 0.421* 07/09/2015     Imaging Studies: Nm Hepato W/eject Fract  10/09/2015  CLINICAL DATA:  Upper abdominal pain with nausea EXAM: NUCLEAR MEDICINE HEPATOBILIARY IMAGING WITH GALLBLADDER EF Views: Anterior right upper quadrant RADIOPHARMACEUTICALS:  5.0 mCi Tc-75m mebrofenin IV COMPARISON:  None. FINDINGS: Liver uptake of radiotracer is normal. There is prompt visualization of gallbladder and small bowel, indicating patency of the cystic and common bile ducts. The patient consumed 8 ounces of Ensure orally with calculation of the computer generated ejection fraction of radiotracer from the gallbladder. The patient did not have clinical symptoms during the oral Ensure consumption. The computer generated ejection fraction of radiotracer from the gallbladder is normal at 65%, normal greater than 33% using the oral agent. IMPRESSION: Study within normal limits. Electronically Signed   By: Bretta Bang III M.D.   On: 10/09/2015 11:13   US Abdomen Limited Ruq  10/06/2015  CLINICAL DATA:  Chronic abdominal pain.  Nausea. EXAM: US ABDOMEN LIMITED - RIGHT UPPER QUADRANT COMPARISON:  None. FINDINGS: Gallbladder: Cholelithiasis. No gallbladder wall thickening or pericholecystic fluid. Negative sonographic Murphy sign. Common bile duct: Diameter: 2.3 mm Liver: No focal lesion identified. Within normal limits in parenchymal echogenicity.  IMPRESSION: Cholelithiasis without sonographic evidence of acute cholecystitis. Electronically Signed   By: Elige Ko   On: 10/06/2015 09:01

## 2015-11-04 NOTE — Patient Instructions (Signed)
1. Start omeprazole once daily on empty stomach. Samples provided.  2. Upper endoscopy with Dr. Jena Gauss. See separate instructions.

## 2015-11-05 NOTE — Assessment & Plan Note (Signed)
25 year old female with multiple GI complaints. She has alternating constipation and loose stools likely IBS related. She has chronic nausea with rare vomiting since she was a child, worsened in the past 7-8 months since starting Topamax. Typical GERD symptoms not well controlled on pantoprazole previously although she was not regular with taking it. Some early satiety. Other issues include frequent motion sickness, lightheadedness with raising her hands above her head, not likely GI related. She has cholelithiasis, clearly does not explain all her symptoms that may be contributing.  Offered patient an upper endoscopy for further evaluation of her symptoms. Rule out complicated GERD, gastritis, peptic ulcer disease given history of NSAID use previously.  I have discussed the risks, alternatives, benefits with regards to but not limited to the risk of reaction to medication, bleeding, infection, perforation and the patient is agreeable to proceed. Written consent to be obtained.  Discussed at length with patient that her symptoms may be multifactorial, some unrelated to GI source, cholelithiasis likely not sole etiology of her symptoms, Topamax likely playing a part in her nausea. Await EGD findings for further direction and management. Patient would prefer any possible surgical referrals to occur in [place].

## 2015-11-06 ENCOUNTER — Encounter (HOSPITAL_COMMUNITY): Payer: Self-pay | Admitting: Emergency Medicine

## 2015-11-06 ENCOUNTER — Emergency Department (HOSPITAL_COMMUNITY)
Admission: EM | Admit: 2015-11-06 | Discharge: 2015-11-06 | Disposition: A | Discharge status: Home / Self Care | Payer: Medicaid Other | Attending: Emergency Medicine | Admitting: Emergency Medicine

## 2015-11-06 DIAGNOSIS — Z791 Long term (current) use of non-steroidal anti-inflammatories (NSAID): Secondary | ICD-10-CM | POA: Insufficient documentation

## 2015-11-06 DIAGNOSIS — F17210 Nicotine dependence, cigarettes, uncomplicated: Secondary | ICD-10-CM | POA: Diagnosis not present

## 2015-11-06 DIAGNOSIS — N390 Urinary tract infection, site not specified: Secondary | ICD-10-CM | POA: Diagnosis not present

## 2015-11-06 DIAGNOSIS — Z91040 Latex allergy status: Secondary | ICD-10-CM | POA: Diagnosis not present

## 2015-11-06 DIAGNOSIS — Z7952 Long term (current) use of systemic steroids: Secondary | ICD-10-CM | POA: Diagnosis not present

## 2015-11-06 DIAGNOSIS — R1110 Vomiting, unspecified: Secondary | ICD-10-CM

## 2015-11-06 DIAGNOSIS — F419 Anxiety disorder, unspecified: Secondary | ICD-10-CM | POA: Insufficient documentation

## 2015-11-06 DIAGNOSIS — K219 Gastro-esophageal reflux disease without esophagitis: Secondary | ICD-10-CM | POA: Diagnosis not present

## 2015-11-06 DIAGNOSIS — L309 Dermatitis, unspecified: Secondary | ICD-10-CM | POA: Diagnosis not present

## 2015-11-06 DIAGNOSIS — R112 Nausea with vomiting, unspecified: Secondary | ICD-10-CM | POA: Diagnosis present

## 2015-11-06 DIAGNOSIS — Z3202 Encounter for pregnancy test, result negative: Secondary | ICD-10-CM | POA: Insufficient documentation

## 2015-11-06 DIAGNOSIS — Z8742 Personal history of other diseases of the female genital tract: Secondary | ICD-10-CM | POA: Insufficient documentation

## 2015-11-06 DIAGNOSIS — R42 Dizziness and giddiness: Secondary | ICD-10-CM

## 2015-11-06 DIAGNOSIS — J45909 Unspecified asthma, uncomplicated: Secondary | ICD-10-CM | POA: Insufficient documentation

## 2015-11-06 LAB — BASIC METABOLIC PANEL
ANION GAP: 8 (ref 5–15)
BUN: 6 mg/dL (ref 6–20)
CALCIUM: 9.1 mg/dL (ref 8.9–10.3)
CO2: 24 mmol/L (ref 22–32)
Chloride: 105 mmol/L (ref 101–111)
Creatinine, Ser: 0.72 mg/dL (ref 0.44–1.00)
GFR calc non Af Amer: >60 mL/min (ref >60)
GLUCOSE: 92 mg/dL (ref 65–99)
Potassium: 3.4 mmol/L — ABNORMAL LOW (ref 3.5–5.1)
Sodium: 137 mmol/L (ref 135–145)

## 2015-11-06 LAB — URINALYSIS, ROUTINE W REFLEX MICROSCOPIC
GLUCOSE, UA: NEGATIVE mg/dL
Hgb urine dipstick: NEGATIVE
Ketones, ur: 15 mg/dL — AB
Nitrite: POSITIVE — AB
PH: 7 (ref 5.0–8.0)
Protein, ur: 30 mg/dL — AB
SPECIFIC GRAVITY, URINE: 1.02 (ref 1.005–1.030)

## 2015-11-06 LAB — CBC
HEMATOCRIT: 40.5 % (ref 36.0–46.0)
Hemoglobin: 13.5 g/dL (ref 12.0–15.0)
MCH: 28.8 pg (ref 26.0–34.0)
MCHC: 33.3 g/dL (ref 30.0–36.0)
MCV: 86.4 fL (ref 78.0–100.0)
Platelets: 426 10*3/uL — ABNORMAL HIGH (ref 150–400)
RBC: 4.69 MIL/uL (ref 3.87–5.11)
RDW: 12.8 % (ref 11.5–15.5)
WBC: 9.8 10*3/uL (ref 4.0–10.5)

## 2015-11-06 LAB — URINE MICROSCOPIC-ADD ON: RBC / HPF: NONE SEEN RBC/hpf (ref 0–5)

## 2015-11-06 LAB — POC URINE PREG, ED: Preg Test, Ur: NEGATIVE

## 2015-11-06 MED ORDER — SODIUM CHLORIDE 0.9 % IV SOLN: INTRAVENOUS | Status: DC

## 2015-11-06 MED ORDER — ONDANSETRON HCL 4 MG/2ML IJ SOLN
4.0000 mg | Freq: Once | INTRAMUSCULAR | Status: AC
  Administered 2015-11-06: 4 mg via INTRAVENOUS
  Filled 2015-11-06: qty 2

## 2015-11-06 MED ORDER — CEPHALEXIN 500 MG PO CAPS: 500.0000 mg | ORAL_CAPSULE | Freq: Four times a day (QID) | ORAL | Status: DC

## 2015-11-06 MED ORDER — MECLIZINE HCL 25 MG PO TABS: 25.0000 mg | ORAL_TABLET | Freq: Three times a day (TID) | ORAL | Status: DC | PRN

## 2015-11-06 MED ORDER — CEFTRIAXONE SODIUM 1 G IJ SOLR
1.0000 g | Freq: Once | INTRAMUSCULAR | Status: AC
  Administered 2015-11-06: 1 g via INTRAVENOUS
  Filled 2015-11-06: qty 10

## 2015-11-06 MED ORDER — ONDANSETRON 4 MG PO TBDP: 4.0000 mg | ORAL_TABLET | Freq: Three times a day (TID) | ORAL | Status: DC | PRN

## 2015-11-06 MED ORDER — SODIUM CHLORIDE 0.9 % IV BOLUS (SEPSIS)
1000.0000 mL | Freq: Once | INTRAVENOUS | Status: AC
  Administered 2015-11-06: 1000 mL via INTRAVENOUS

## 2015-11-06 MED ORDER — MECLIZINE HCL 12.5 MG PO TABS
25.0000 mg | ORAL_TABLET | Freq: Once | ORAL | Status: AC
  Administered 2015-11-06: 25 mg via ORAL
  Filled 2015-11-06: qty 2

## 2015-11-06 NOTE — Discharge Instructions (Signed)
Follow-up with your GI Dr. in your primary care doctor for further evaluation. Today's workup does show some evidence of a urinary tract infection. Take the Keflex on a regular basis. Follow-up if not improving some in a couple days. Also take the anti-Burke for the dizziness. Take Zofran as needed for the nausea and vomiting. Return for any new or worse symptoms.

## 2015-11-06 NOTE — ED Provider Notes (Signed)
CSN: [id number]     Arrival date & time 11/06/15  1308 History   First MD Initiated Contact with Patient 11/06/15 1831     Chief Complaint  Patient presents with  . Nausea     (Consider location/radiation/quality/duration/timing/severity/associated sxs/prior Treatment) The history is provided by the patient.   25 year old female with a history of irritable bowel eczema with one year history of episodic episodes of dizziness nausea and vomiting and intermittent episodes of lower quadrant of abdominal pain pelvic pain worse with intercourse. Patient is known to have gallstones but it is not clear whether they are symptomatic. Patient is currently being followed by GI medicine and they have an upper endoscopy scheduled. Patient is already of been evaluated for gallstones with an ultrasound and also at high scan showing the gallbladder was functioning properly. Patient here today had several episodes of nausea and vomiting no right upper quadrant abdominal pain. No diarrhea. Patient's also had some intermittent episodes of dizziness with a little bit of room spinning. No fevers. Denies dysuria. Denies any vaginal discharge.  Past Medical History  Diagnosis Date  . Eczema 2008  . Asthma   . Ovarian cyst   . Asthma   . Anxiety   . GERD (gastroesophageal reflux disease)    Past Surgical History  Procedure Laterality Date  . Ovarian cyst removal    . Ovarian cyst removal  2011  . Tubal ligation     Family History  Problem Relation Age of Onset  . Diabetes Mother   . Colon cancer Neg Hx   . Crohn's disease Neg Hx   . Celiac disease Neg Hx    Social History  Substance Use Topics  . Smoking status: Current Every Day Smoker -- 0.50 packs/day for 5 years    Types: Cigarettes    Start date: 02/11/2006  . Smokeless tobacco: Never Used  . Alcohol Use: 0.0 oz/week    0 Standard drinks or equivalent per week     Comment: occassionally   OB History    Gravida Para Term Preterm AB TAB SAB  Ectopic Multiple Living   2 2 1  0 0 0 0 0 0 2     Review of Systems  Constitutional: Negative for fever.  HENT: Negative for congestion.   Eyes: Negative for redness.  Respiratory: Negative for shortness of breath.   Cardiovascular: Negative for chest pain.  Gastrointestinal: Positive for nausea, vomiting and abdominal pain.  Genitourinary: Negative for dysuria, hematuria and vaginal discharge.  Musculoskeletal: Negative for back pain.  Skin: Positive for rash.  Neurological: Positive for dizziness. Negative for syncope and headaches.  Psychiatric/Behavioral: Negative for confusion.      Allergies  Latex  Home Medications   Prior to Admission medications   Medication Sig Start Date End Date Taking? Authorizing Provider  ALPRAZolam Prudy Feeler) 1 MG tablet Take 0.5-1 tablets (0.5-1 mg total) by mouth 2 (two) times daily as needed for anxiety. 10/14/15  Yes Campbell Riches, NP  betamethasone dipropionate (DIPROLENE) 0.05 % cream Apply topically 2 (two) times daily. 11/08/14  Yes Campbell Riches, NP  escitalopram (LEXAPRO) 20 MG tablet Take 1 tablet (20 mg total) by mouth daily. 10/14/15 10/13/16 Yes Campbell Riches, NP  omeprazole (PRILOSEC) 20 MG capsule Take 1 capsule (20 mg total) by mouth daily. 11/04/15  Yes Tiffany Kocher, PA-C  topiramate (TOPAMAX) 50 MG tablet TAKE 1 TABLET BY MOUTH AT BEDTIME FOR MIGRAINES. 10/14/15  Yes Campbell Riches, NP  albuterol (PROVENTIL  HFA;VENTOLIN HFA) 108 (90 BASE) MCG/ACT inhaler Inhale 2 puffs into the lungs every 6 (six) hours as needed for wheezing. 11/08/14   Campbell Riches, NP  cephALEXin (KEFLEX) 500 MG capsule Take 1 capsule (500 mg total) by mouth 4 (four) times daily. 11/06/15   Vanetta Mulders, MD  HYDROcodone-acetaminophen (NORCO/VICODIN) 5-325 MG per tablet Take 2 tablets by mouth every 6 (six) hours as needed for moderate pain. 04/12/15   Marily Memos, MD  ibuprofen (ADVIL,MOTRIN) 600 MG tablet Take 1 tablet (600 mg total) by mouth 4  (four) times daily. 03/11/15   Ivery Quale, PA-C  meclizine (ANTIVERT) 25 MG tablet Take 1 tablet (25 mg total) by mouth 3 (three) times daily as needed for dizziness. 11/06/15   Vanetta Mulders, MD  naproxen (NAPROSYN) 500 MG tablet Take 1 tablet (500 mg total) by mouth 2 (two) times daily with a meal. Prn migraine Patient not taking: Reported on 11/06/2015 11/27/14   Campbell Riches, NP  ondansetron (ZOFRAN ODT) 4 MG disintegrating tablet Take 1 tablet (4 mg total) by mouth every 8 (eight) hours as needed. 11/06/15   Vanetta Mulders, MD  ondansetron (ZOFRAN-ODT) 8 MG disintegrating tablet Take 1 tablet (8 mg total) by mouth every 8 (eight) hours as needed for nausea or vomiting. 10/14/15   Campbell Riches, NP   BP 128/89 mmHg  Pulse 86  Temp(Src) 98.3 F (36.8 C) (Oral)  Resp 14  SpO2 98%  LMP 10/22/2015 (Approximate) Physical Exam  Constitutional: She is oriented to person, place, and time. She appears well-developed and well-nourished. No distress.  HENT:  Head: Normocephalic and atraumatic.  Mouth/Throat: Oropharynx is clear and moist.  Eyes: Conjunctivae and EOM are normal. Pupils are equal, round, and reactive to light.  Neck: Normal range of motion. Neck supple.  Cardiovascular: Normal rate, regular rhythm and normal heart sounds.   No murmur heard. Pulmonary/Chest: Effort normal. No respiratory distress.  Abdominal: Soft. Bowel sounds are normal. There is no tenderness.  Musculoskeletal: Normal range of motion.  Neurological: She is alert and oriented to person, place, and time. No cranial nerve deficit. She exhibits normal muscle tone. Coordination normal.  Skin: Skin is warm. Rash noted.  Patient with her typical eczema rash on her face and chest.  Nursing note and vitals reviewed.   ED Course  Procedures (including critical care time) Labs Review Labs Reviewed  CBC - Abnormal; Notable for the following:    Platelets 426 (*)    All other components within normal limits   URINALYSIS, ROUTINE W REFLEX MICROSCOPIC (NOT AT [facility]) - Abnormal; Notable for the following:    APPearance HAZY (*)    Bilirubin Urine SMALL (*)    Ketones, ur 15 (*)    Protein, ur 30 (*)    Nitrite POSITIVE (*)    Leukocytes, UA TRACE (*)    All other components within normal limits  BASIC METABOLIC PANEL - Abnormal; Notable for the following:    Potassium 3.4 (*)    All other components within normal limits  URINE MICROSCOPIC-ADD ON - Abnormal; Notable for the following:    Squamous Epithelial / LPF 6-30 (*)    Bacteria, UA MANY (*)    All other components within normal limits  URINE CULTURE  POC URINE PREG, ED   Results for orders placed or performed during the hospital encounter of 11/06/15  CBC  Result Value Ref Range   WBC 9.8 4.0 - 10.5 K/uL   RBC 4.69 3.87 -  5.11 MIL/uL   Hemoglobin 13.5 12.0 - 15.0 g/dL   HCT 16.1 09.6 - 04.5 %   MCV 86.4 78.0 - 100.0 fL   MCH 28.8 26.0 - 34.0 pg   MCHC 33.3 30.0 - 36.0 g/dL   RDW 40.9 81.1 - 91.4 %   Platelets 426 (H) 150 - 400 K/uL  Urinalysis, Routine w reflex microscopic (not at [facility])  Result Value Ref Range   Color, Urine YELLOW YELLOW   APPearance HAZY (A) CLEAR   Specific Gravity, Urine 1.020 1.005 - 1.030   pH 7.0 5.0 - 8.0   Glucose, UA NEGATIVE NEGATIVE mg/dL   Hgb urine dipstick NEGATIVE NEGATIVE   Bilirubin Urine SMALL (A) NEGATIVE   Ketones, ur 15 (A) NEGATIVE mg/dL   Protein, ur 30 (A) NEGATIVE mg/dL   Nitrite POSITIVE (A) NEGATIVE   Leukocytes, UA TRACE (A) NEGATIVE  Basic metabolic panel  Result Value Ref Range   Sodium 137 135 - 145 mmol/L   Potassium 3.4 (L) 3.5 - 5.1 mmol/L   Chloride 105 101 - 111 mmol/L   CO2 24 22 - 32 mmol/L   Glucose, Bld 92 65 - 99 mg/dL   BUN 6 6 - 20 mg/dL   Creatinine, Ser 7.82 0.44 - 1.00 mg/dL   Calcium 9.1 8.9 - 95.6 mg/dL   GFR calc non Af Amer >60 >60 mL/min   GFR calc Af Amer >60 >60 mL/min   Anion gap 8 5 - 15  Urine microscopic-add on  Result Value Ref Range    Squamous Epithelial / LPF 6-30 (A) NONE SEEN   WBC, UA 6-30 0 - 5 WBC/hpf   RBC / HPF NONE SEEN 0 - 5 RBC/hpf   Bacteria, UA MANY (A) NONE SEEN  POC Urine Pregnancy, ED (do NOT order at [facility])  Result Value Ref Range   Preg Test, Ur NEGATIVE NEGATIVE     Imaging Review No results found. I have personally reviewed and evaluated these images and lab results as part of my medical decision-making.   EKG Interpretation None      MDM   Final diagnoses:  UTI (lower urinary tract infection)  Intractable vomiting with nausea, vomiting of unspecified type  Dizziness    Positive nitrite urine suggestive of urinary tract infection. Patient will be treated with Keflex. Here in the emergency apartment received 1 g of Rocephin. Patient also given anti-version here for the dizziness and feeling better. Also treated for the nausea. Patient was several episodes of nausea and vomiting today. She has struggled with symptoms similar to this for the past year. Is currently being worked up by GI medicine and her primary care doctor. Patient's also had the dizziness for a long period of time. Also associated with intermittent episodes of lower quadrant pelvic pain worse with intercourse. Bili thing that was different today was multiple episodes of nausea and vomiting and the dizziness was increased. Associated with a little bit of room spinning so suggestive of some mild vertigo. Patient also with a stopped up feeling in her right ear examination tympanic membrane is normal. No evidence of any infection.  Patient will be discharged home with anti-GERD Zofran and the antibiotic Keflex. Will follow-up with her primary care doctor and GI medicine. Patient nontoxic no acute distress. Urine was sent for culture.  In addition pregnancy test was negative.  Vanetta Mulders, MD 11/06/15 2059

## 2015-11-06 NOTE — ED Notes (Signed)
Having Nausea and dizziness for last year.  Increased nausea today.  No vomiting and have not eaten since yesterday at 11 am.

## 2015-11-06 NOTE — ED Notes (Signed)
Pt states understanding of care given and follow up instructions.  Instructed to take all antibiotics until gone not just until feeling better.  Drink plenty of fluids including water and cranberry juice.

## 2015-11-06 NOTE — Progress Notes (Signed)
cc'ed to pcp °

## 2015-11-09 LAB — URINE CULTURE: Culture: >=100000

## 2015-11-10 ENCOUNTER — Telehealth (HOSPITAL_BASED_OUTPATIENT_CLINIC_OR_DEPARTMENT_OTHER): Payer: Self-pay | Admitting: Emergency Medicine

## 2015-11-10 ENCOUNTER — Other Ambulatory Visit: Payer: Self-pay | Admitting: Family Medicine

## 2015-11-10 ENCOUNTER — Encounter: Payer: Self-pay | Admitting: Family Medicine

## 2015-11-10 ENCOUNTER — Ambulatory Visit (INDEPENDENT_AMBULATORY_CARE_PROVIDER_SITE_OTHER): Payer: Self-pay | Admitting: Family Medicine

## 2015-11-10 VITALS — BP 118/80 | Temp 98.3°F | Ht 62.0 in | Wt 154.0 lb

## 2015-11-10 DIAGNOSIS — B9689 Other specified bacterial agents as the cause of diseases classified elsewhere: Secondary | ICD-10-CM

## 2015-11-10 DIAGNOSIS — J208 Acute bronchitis due to other specified organisms: Principal | ICD-10-CM

## 2015-11-10 DIAGNOSIS — J310 Chronic rhinitis: Secondary | ICD-10-CM

## 2015-11-10 DIAGNOSIS — J329 Chronic sinusitis, unspecified: Secondary | ICD-10-CM

## 2015-11-10 DIAGNOSIS — J00 Acute nasopharyngitis [common cold]: Secondary | ICD-10-CM

## 2015-11-10 MED ORDER — AZITHROMYCIN 250 MG PO TABS: ORAL_TABLET | ORAL | Status: DC

## 2015-11-10 NOTE — Telephone Encounter (Signed)
Post ED Visit - Positive Culture Follow-up  Culture report reviewed by antimicrobial stewardship pharmacist:   Enzo Bi, Pharm.D.  Celedonio Miyamoto, Pharm.D., BCPS  Garvin Fila, Pharm.D.  Georgina Pillion, Pharm.D., BCPS  [place], [address].D., BCPS, AAHIVP  Estella Husk, Pharm.D., BCPS, AAHIVP  Cassie Stewart, Pharm.D.  Sherle Poe, [address].D.  Positive urine culture E..coli Treated with cephalexin, organism sensitive to the same and no further patient follow-up is required at this time.  Berle Mull 11/10/2015, 12:16 PM

## 2015-11-10 NOTE — Telephone Encounter (Signed)
Id like a one yr hx on this pts xanax use who prescribed how much etc

## 2015-11-10 NOTE — Telephone Encounter (Signed)
According to epic patient has been on Xanax since July 2014 and was originally prescribed at that time by [facility]

## 2015-11-10 NOTE — Progress Notes (Signed)
   Subjective:    Patient ID: Karen Shields, female    DOB: 1991-06-27, 25 y.o.   MRN: [id number]  Cough This is a new problem. Episode onset: 4 days. Associated symptoms comments: abd pain, runny nose, wheezing, diarrhea, vomiting, sneezing. .   Patient unfortunately smokes cough is productive yellowish phlegm.  No high fevers but low-grade diminished energy and achy   Review of Systems  Respiratory: Positive for cough.    No rash    Objective:   Physical Exam  Alert vitals stable HET moderate nasal congestion pharynx normal lungs bronchial cough heart rare rhythm      Assessment & Plan:  Impression post viral rhinosinusitis/bronchitis plan antibiotics prescribed. Symptom care discussed warning signs discussed encouraged to stop smoking also very long discussion on anxiety medicine. Please see prior notes. Patient has been taking one half to one 1 mg Xanax No. 60 per month I have advised her we cannot exceed this. She suffers from chronic anxiety and states she needs it to function appropriately. Drowsy warning disc

## 2015-11-13 ENCOUNTER — Encounter (HOSPITAL_COMMUNITY)
Admission: RE | Disposition: A | Discharge status: Home / Self Care | Payer: Self-pay | Source: Ambulatory Visit | Attending: Internal Medicine

## 2015-11-13 ENCOUNTER — Ambulatory Visit (HOSPITAL_COMMUNITY)
Admission: RE | Admit: 2015-11-13 | Discharge: 2015-11-13 | Disposition: A | Discharge status: Home / Self Care | Payer: Medicaid Other | Source: Ambulatory Visit | Attending: Internal Medicine | Admitting: Internal Medicine

## 2015-11-13 ENCOUNTER — Encounter (HOSPITAL_COMMUNITY): Payer: Self-pay | Admitting: *Deleted

## 2015-11-13 DIAGNOSIS — R6881 Early satiety: Secondary | ICD-10-CM | POA: Insufficient documentation

## 2015-11-13 DIAGNOSIS — F419 Anxiety disorder, unspecified: Secondary | ICD-10-CM | POA: Diagnosis not present

## 2015-11-13 DIAGNOSIS — K2210 Ulcer of esophagus without bleeding: Secondary | ICD-10-CM | POA: Insufficient documentation

## 2015-11-13 DIAGNOSIS — Z79899 Other long term (current) drug therapy: Secondary | ICD-10-CM | POA: Insufficient documentation

## 2015-11-13 DIAGNOSIS — R1013 Epigastric pain: Secondary | ICD-10-CM | POA: Diagnosis not present

## 2015-11-13 DIAGNOSIS — F17210 Nicotine dependence, cigarettes, uncomplicated: Secondary | ICD-10-CM | POA: Diagnosis not present

## 2015-11-13 DIAGNOSIS — Z791 Long term (current) use of non-steroidal anti-inflammatories (NSAID): Secondary | ICD-10-CM | POA: Insufficient documentation

## 2015-11-13 DIAGNOSIS — K2100 Gastro-esophageal reflux disease with esophagitis, without bleeding: Secondary | ICD-10-CM | POA: Insufficient documentation

## 2015-11-13 DIAGNOSIS — J45909 Unspecified asthma, uncomplicated: Secondary | ICD-10-CM | POA: Insufficient documentation

## 2015-11-13 DIAGNOSIS — R110 Nausea: Secondary | ICD-10-CM | POA: Insufficient documentation

## 2015-11-13 DIAGNOSIS — K219 Gastro-esophageal reflux disease without esophagitis: Secondary | ICD-10-CM

## 2015-11-13 HISTORY — PX: ESOPHAGOGASTRODUODENOSCOPY: SHX5428

## 2015-11-13 SURGERY — EGD (ESOPHAGOGASTRODUODENOSCOPY)
Anesthesia: Moderate Sedation

## 2015-11-13 MED ORDER — PROMETHAZINE HCL 25 MG/ML IJ SOLN
25.0000 mg | Freq: Once | INTRAMUSCULAR | Status: AC
  Administered 2015-11-13: 25 mg via INTRAVENOUS

## 2015-11-13 MED ORDER — SODIUM CHLORIDE 0.9% FLUSH
INTRAVENOUS | Status: AC
  Filled 2015-11-13: qty 10

## 2015-11-13 MED ORDER — SODIUM CHLORIDE 0.9 % IV SOLN
INTRAVENOUS | Status: DC
  Administered 2015-11-13: 13:00:00 via INTRAVENOUS

## 2015-11-13 MED ORDER — MIDAZOLAM HCL 5 MG/5ML IJ SOLN
INTRAMUSCULAR | Status: DC | PRN
  Administered 2015-11-13 (×2): 2 mg via INTRAVENOUS
  Administered 2015-11-13: 1 mg via INTRAVENOUS
  Administered 2015-11-13 (×2): 2 mg via INTRAVENOUS

## 2015-11-13 MED ORDER — MIDAZOLAM HCL 5 MG/5ML IJ SOLN
INTRAMUSCULAR | Status: AC
  Filled 2015-11-13: qty 10

## 2015-11-13 MED ORDER — ONDANSETRON HCL 4 MG/2ML IJ SOLN
INTRAMUSCULAR | Status: AC
  Filled 2015-11-13: qty 2

## 2015-11-13 MED ORDER — ONDANSETRON HCL 4 MG/2ML IJ SOLN
INTRAMUSCULAR | Status: DC | PRN
  Administered 2015-11-13: 4 mg via INTRAVENOUS

## 2015-11-13 MED ORDER — PROMETHAZINE HCL 25 MG/ML IJ SOLN
INTRAMUSCULAR | Status: AC
  Filled 2015-11-13: qty 1

## 2015-11-13 MED ORDER — STERILE WATER FOR IRRIGATION IR SOLN
Status: DC | PRN
  Administered 2015-11-13: 14:00:00

## 2015-11-13 MED ORDER — MEPERIDINE HCL 100 MG/ML IJ SOLN
INTRAMUSCULAR | Status: AC
  Filled 2015-11-13: qty 2

## 2015-11-13 MED ORDER — MEPERIDINE HCL 100 MG/ML IJ SOLN
INTRAMUSCULAR | Status: DC | PRN
  Administered 2015-11-13 (×2): 50 mg via INTRAVENOUS
  Administered 2015-11-13: 25 mg via INTRAVENOUS
  Administered 2015-11-13: 50 mg via INTRAVENOUS

## 2015-11-13 MED ORDER — LIDOCAINE VISCOUS 2 % MT SOLN
OROMUCOSAL | Status: AC
  Filled 2015-11-13: qty 15

## 2015-11-13 NOTE — H&P (View-Only) (Signed)
Primary Care Physician:  Lubertha South, MD  Primary Gastroenterologist:  Roetta Sessions, MD   Chief Complaint  Patient presents with  . Nausea  . Emesis    HPI:  Karen Shields is a 25 y.o. female here for further evaluation of nausea at the recommendation of her PCP. She has several year history of frequent nausea with intermittent vomiting. Wakes up nauseated at least 4 times weekly. Takes Zofran about every other morning. Nausea associated with cooking. Often loses her appetite while preparing her meal. States she used to weigh 165 pounds. 152 pounds today. Also notes feeling lightheaded when she does anything that requires raising her hands above her head. She gets car sick a lot. When this occurs she has the sensation that her neck is on fire. This is been occurring since she was a young child. Complains of heartburn, indigestion. Some early satiety. No dysphagia. Bowel movements fluctuate between constipation and diarrhea, about half and half of each. No melena or rectal bleeding. Regular menses. Status post tubal ligation.  Previously has used pantoprazole on an intermittent basis without much relief. Recent abdominal ultrasound showed cholelithiasis but no evidence of acute cholecystitis. HIDA scan unremarkable. Labs as outlined below, unremarkable.  Patient reports having chronic nausea for years. However she started on Topamax about 7 months and she identifies symptoms are worse since that time. Denies taking nsaids regularly at this point.  Current Outpatient Prescriptions  Medication Sig Dispense Refill  . albuterol (PROVENTIL HFA;VENTOLIN HFA) 108 (90 BASE) MCG/ACT inhaler Inhale 2 puffs into the lungs every 6 (six) hours as needed for wheezing. 18 g 5  . ALPRAZolam (XANAX) 1 MG tablet Take 0.5-1 tablets (0.5-1 mg total) by mouth 2 (two) times daily as needed for anxiety. 60 tablet 0  . betamethasone dipropionate (DIPROLENE) 0.05 % cream Apply topically 2 (two) times daily. 30 g 0   . escitalopram (LEXAPRO) 20 MG tablet Take 1 tablet (20 mg total) by mouth daily. 90 tablet 3  . HYDROcodone-acetaminophen (NORCO/VICODIN) 5-325 MG per tablet Take 2 tablets by mouth every 6 (six) hours as needed for moderate pain. 10 tablet 0  . ibuprofen (ADVIL,MOTRIN) 600 MG tablet Take 1 tablet (600 mg total) by mouth 4 (four) times daily. 30 tablet 0  . naproxen (NAPROSYN) 500 MG tablet Take 1 tablet (500 mg total) by mouth 2 (two) times daily with a meal. Prn migraine 30 tablet 0  . ondansetron (ZOFRAN-ODT) 8 MG disintegrating tablet Take 1 tablet (8 mg total) by mouth every 8 (eight) hours as needed for nausea or vomiting. 60 tablet 3  . topiramate (TOPAMAX) 50 MG tablet TAKE 1 TABLET BY MOUTH AT BEDTIME FOR MIGRAINES. 90 tablet 3   No current facility-administered medications for this visit.    Allergies as of 11/04/2015 - Review Complete 11/04/2015  Allergen Reaction Noted  . Latex Dermatitis 11/04/2015    Past Medical History  Diagnosis Date  . Eczema 2008  . Asthma   . Ovarian cyst   . Asthma   . Anxiety   . GERD (gastroesophageal reflux disease)     Past Surgical History  Procedure Laterality Date  . Ovarian cyst removal    . Ovarian cyst removal  2011  . Tubal ligation      Family History  Problem Relation Age of Onset  . Diabetes Mother   . Colon cancer Neg Hx   . Crohn's disease Neg Hx   . Celiac disease Neg Hx     Social  History   Social History  . Marital Status: Married    Spouse Name: N/A  . Number of Children: 2  . Years of Education: N/A   Occupational History  . Not on file.   Social History Main Topics  . Smoking status: Current Every Day Smoker -- 0.50 packs/day for 5 years    Types: Cigarettes    Start date: 02/11/2006  . Smokeless tobacco: Never Used  . Alcohol Use: 0.0 oz/week    0 Standard drinks or equivalent per week     Comment: occassionally  . Drug Use: No  . Sexual Activity:    Partners: Male    Birth Control/  Protection: Surgical   Other Topics Concern  . Not on file   Social History Narrative   ** Merged History Encounter **    Four daughters between herself and husband, two biological.       ROS:  General: see hpi Eyes: Negative for vision changes.  ENT: Negative for hoarseness, difficulty swallowing , nasal congestion. CV: Negative for chest pain, angina, palpitations, dyspnea on exertion, peripheral edema.  Respiratory: Negative for dyspnea at rest, dyspnea on exertion, cough, sputum, wheezing.  GI: See history of present illness. GU:  Negative for dysuria, hematuria, urinary incontinence, urinary frequency, nocturnal urination.  MS: Negative for joint pain, low back pain.  Derm: Negative for rash or itching.  Neuro: Negative for weakness, abnormal sensation, seizure, frequent headaches, memory loss, confusion.  Psych: Negative for anxiety, depression, suicidal ideation, hallucinations.  Endo: Negative for unusual weight change.  Heme: Negative for bruising or bleeding. Allergy: Negative for rash or hives.    Physical Examination:  BP 134/74 mmHg  Pulse 91  Temp(Src) 97.3 F (36.3 C)  Ht  (1.6 m)  Wt 152 lb 12.8 oz (69.31 kg)  BMI 27.07 kg/m2  LMP 10/22/2015 (Approximate)   General: Well-nourished, well-developed in no acute distress.  Head: Normocephalic, atraumatic.   Eyes: Conjunctiva pink, no icterus. Mouth: Oropharyngeal mucosa moist and pink , no lesions erythema or exudate. Neck: Supple without thyromegaly, masses, or lymphadenopathy.  Lungs: Clear to auscultation bilaterally.  Heart: Regular rate and rhythm, no murmurs rubs or gallops.  Abdomen: Bowel sounds are normal, nontender, nondistended, no hepatosplenomegaly or masses, no abdominal bruits or    hernia , no rebound or guarding.   Rectal: not performed Extremities: No lower extremity edema. No clubbing or deformities.  Neuro: Alert and oriented x 4 , grossly normal neurologically.  Skin: Warm and  dry, no rash or jaundice.   Psych: Alert and cooperative, normal mood and affect.  Labs: Lab Results  Component Value Date   WBC 7.9 07/09/2015   HGB 14.5 08/22/2014   HCT 39.0 07/09/2015   MCV 87 07/09/2015   PLT 364 07/09/2015   Lab Results  Component Value Date   CREATININE 0.79 07/09/2015   BUN 6 07/09/2015   NA 140 07/09/2015   K 4.2 07/09/2015   CL 102 07/09/2015   CO2 21 07/09/2015   Lab Results  Component Value Date   ALT 12 07/09/2015   AST 15 07/09/2015   ALKPHOS 90 07/09/2015   BILITOT 0.3 07/09/2015   Lab Results  Component Value Date   TSH 0.421* 07/09/2015     Imaging Studies: Nm Hepato W/eject Fract  10/09/2015  CLINICAL DATA:  Upper abdominal pain with nausea EXAM: NUCLEAR MEDICINE HEPATOBILIARY IMAGING WITH GALLBLADDER EF Views: Anterior right upper quadrant RADIOPHARMACEUTICALS:  5.0 mCi Tc-75m mebrofenin IV COMPARISON:  None. FINDINGS: Liver uptake of radiotracer is normal. There is prompt visualization of gallbladder and small bowel, indicating patency of the cystic and common bile ducts. The patient consumed 8 ounces of Ensure orally with calculation of the computer generated ejection fraction of radiotracer from the gallbladder. The patient did not have clinical symptoms during the oral Ensure consumption. The computer generated ejection fraction of radiotracer from the gallbladder is normal at 65%, normal greater than 33% using the oral agent. IMPRESSION: Study within normal limits. Electronically Signed   By: Bretta Bang III M.D.   On: 10/09/2015 11:13   US Abdomen Limited Ruq  10/06/2015  CLINICAL DATA:  Chronic abdominal pain.  Nausea. EXAM: US ABDOMEN LIMITED - RIGHT UPPER QUADRANT COMPARISON:  None. FINDINGS: Gallbladder: Cholelithiasis. No gallbladder wall thickening or pericholecystic fluid. Negative sonographic Murphy sign. Common bile duct: Diameter: 2.3 mm Liver: No focal lesion identified. Within normal limits in parenchymal echogenicity.  IMPRESSION: Cholelithiasis without sonographic evidence of acute cholecystitis. Electronically Signed   By: Elige Ko   On: 10/06/2015 09:01

## 2015-11-13 NOTE — Discharge Instructions (Signed)
EGD Discharge instructions Please read the instructions outlined below and refer to this sheet in the next few weeks. These discharge instructions provide you with general information on caring for yourself after you leave the hospital. Your doctor may also give you specific instructions. While your treatment has been planned according to the most current medical practices available, unavoidable complications occasionally occur. If you have any problems or questions after discharge, please call your doctor. ACTIVITY  You may resume your regular activity but move at a slower pace for the next 24 hours.   Take frequent rest periods for the next 24 hours.   Walking will help expel (get rid of) the air and reduce the bloated feeling in your abdomen.   No driving for 24 hours (because of the anesthesia (medicine) used during the test).   You may shower.   Do not sign any important legal documents or operate any machinery for 24 hours (because of the anesthesia used during the test).  NUTRITION  Drink plenty of fluids.   You may resume your normal diet.   Begin with a light meal and progress to your normal diet.   Avoid alcoholic beverages for 24 hours or as instructed by your caregiver.  MEDICATIONS  You may resume your normal medications unless your caregiver tells you otherwise.  WHAT YOU CAN EXPECT TODAY  You may experience abdominal discomfort such as a feeling of fullness or gas pains.  FOLLOW-UP  Your doctor will discuss the results of your test with you.  SEEK IMMEDIATE MEDICAL ATTENTION IF ANY OF THE FOLLOWING OCCUR:  Excessive nausea (feeling sick to your stomach) and/or vomiting.   Severe abdominal pain and distention (swelling).   Trouble swallowing.   Temperature over 101 F (37.8 C).   Rectal bleeding or vomiting of blood.    GERD information provided  Begin Dexilant 60 mg daily.  Go by my office for free samples  Stop omeprazole  Stop smoking  Office  visit with [place] in 6 weeks       Gastroesophageal Reflux Disease, Adult Normally, food travels down the esophagus and stays in the stomach to be digested. However, when a person has gastroesophageal reflux disease (GERD), food and stomach acid move back up into the esophagus. When this happens, the esophagus becomes sore and inflamed. Over time, GERD can create small holes (ulcers) in the lining of the esophagus.  CAUSES This condition is caused by a problem with the muscle between the esophagus and the stomach (lower esophageal sphincter, or LES). Normally, the LES muscle closes after food passes through the esophagus to the stomach. When the LES is weakened or abnormal, it does not close properly, and that allows food and stomach acid to go back up into the esophagus. The LES can be weakened by certain dietary substances, medicines, and medical conditions, including:  Tobacco use.  Pregnancy.  Having a hiatal hernia.  Heavy alcohol use.  Certain foods and beverages, such as coffee, chocolate, onions, and peppermint. RISK FACTORS This condition is more likely to develop in:  People who have an increased body weight.  People who have connective tissue disorders.  People who use NSAID medicines. SYMPTOMS Symptoms of this condition include:  Heartburn.  Difficult or painful swallowing.  The feeling of having a lump in the throat.  Abitter taste in the mouth.  Bad breath.  Having a large amount of saliva.  Having an upset or bloated stomach.  Belching.  Chest pain.  Shortness of breath  or wheezing.  Ongoing (chronic) cough or a night-time cough.  Wearing away of tooth enamel.  Weight loss. Different conditions can cause chest pain. Make sure to see your health care provider if you experience chest pain. DIAGNOSIS Your health care provider will take a medical history and perform a physical exam. To determine if you have mild or severe GERD, your health care  provider may also monitor how you respond to treatment. You may also have other tests, including:  An endoscopy toexamine your stomach and esophagus with a small camera.  A test thatmeasures the acidity level in your esophagus.  A test thatmeasures how much pressure is on your esophagus.  A barium swallow or modified barium swallow to show the shape, size, and functioning of your esophagus. TREATMENT The goal of treatment is to help relieve your symptoms and to prevent complications. Treatment for this condition may vary depending on how severe your symptoms are. Your health care provider may recommend:  Changes to your diet.  Medicine.  Surgery. HOME CARE INSTRUCTIONS Diet  Follow a diet as recommended by your health care provider. This may involve avoiding foods and drinks such as:  Coffee and tea (with or without caffeine).  Drinks that containalcohol.  Energy drinks and sports drinks.  Carbonated drinks or sodas.  Chocolate and cocoa.  Peppermint and mint flavorings.  Garlic and onions.  Horseradish.  Spicy and acidic foods, including peppers, chili powder, curry powder, vinegar, hot sauces, and barbecue sauce.  Citrus fruit juices and citrus fruits, such as oranges, lemons, and limes.  Tomato-based foods, such as red sauce, chili, salsa, and pizza with red sauce.  Fried and fatty foods, such as donuts, french fries, potato chips, and high-fat dressings.  High-fat meats, such as hot dogs and fatty cuts of red and white meats, such as rib eye steak, sausage, ham, and bacon.  High-fat dairy items, such as whole milk, butter, and cream cheese.  Eat small, frequent meals instead of large meals.  Avoid drinking large amounts of liquid with your meals.  Avoid eating meals during the 2-3 hours before bedtime.  Avoid lying down right after you eat.  Do not exercise right after you eat. General Instructions  Pay attention to any changes in your  symptoms.  Take over-the-counter and prescription medicines only as told by your health care provider. Do not take aspirin, ibuprofen, or other NSAIDs unless your health care provider told you to do so.  Do not use any tobacco products, including cigarettes, chewing tobacco, and e-cigarettes. If you need help quitting, ask your health care provider.  Wear loose-fitting clothing. Do not wear anything tight around your waist that causes pressure on your abdomen.  Raise (elevate) the head of your bed 6 inches (15cm).  Try to reduce your stress, such as with yoga or meditation. If you need help reducing stress, ask your health care provider.  If you are overweight, reduce your weight to an amount that is healthy for you. Ask your health care provider for guidance about a safe weight loss goal.  Keep all follow-up visits as told by your health care provider. This is important. SEEK MEDICAL CARE IF:  You have new symptoms.  You have unexplained weight loss.  You have difficulty swallowing, or it hurts to swallow.  You have wheezing or a persistent cough.  Your symptoms do not improve with treatment.  You have a hoarse voice. SEEK IMMEDIATE MEDICAL CARE IF:  You have pain in your arms,  neck, jaw, teeth, or back.  You feel sweaty, dizzy, or light-headed.  You have chest pain or shortness of breath.  You vomit and your vomit looks like blood or coffee grounds.  You faint.  Your stool is bloody or black.  You cannot swallow, drink, or eat.   This information is not intended to replace advice given to you by your health care provider. Make sure you discuss any questions you have with your health care provider.   Document Released: 06/16/2005 Document Revised: 05/28/2015 Document Reviewed: 01/01/2015 Elsevier Interactive Patient Education [employer].

## 2015-11-13 NOTE — Op Note (Signed)
[facility] [address] [place], [address]   ENDOSCOPY PROCEDURE REPORT  PATIENT: Karen Shields, Karen Shields  MR#: [id number] BIRTHDATE: 1990-10-04 , 24  yrs. old GENDER: female ENDOSCOPIST: R.  Roetta Sessions, MD FACP FACG REFERRED BY:  Simone Curia, M.D. PROCEDURE DATE:  11/21/15 PROCEDURE:  EGD, diagnostic INDICATIONS:  Epigastric pain. MEDICATIONS: Versed 9 mg IV and Demerol 175 mg IV in divided doses. Xylocaine gel orally.  Phenergan 25 mg IV.  Zofran 4 mg IV. ASA CLASS:      Class II  CONSENT: The risks, benefits, limitations, alternatives and imponderables have been discussed.  The potential for biopsy, esophogeal dilation, etc. have also been reviewed.  Questions have been answered.  All parties agreeable.  Please see the history and physical in the medical record for more information.  DESCRIPTION OF PROCEDURE: After the risks benefits and alternatives of the procedure were thoroughly explained, informed consent was obtained.  The EG-[id number] ([id number]) endoscope was introduced through the mouth and advanced to the second portion of the duodenum , limited by Without limitations. The instrument was slowly withdrawn as the mucosa was fully examined. Estimated blood loss is zero unless otherwise noted in this procedure report.    Patient had 4 quadrant distal esophageal erosions coming up as much as 2 cm from the GE junction.  No Barrett's epithelium seen. Tubular esophagus was patent throughout its course.  Stomach empty. 1 cm hiatal hernia present.  Normal-appearing gastric mucosa Patent pylorus.  Normal-appearing first and second portion of the duodenum.  Retroflexed views revealed a hiatal hernia.     The scope was then withdrawn from the patient and the procedure completed.  COMPLICATIONS: There were no immediate complications.  ENDOSCOPIC IMPRESSION: Erosive reflux esophagitis?"likely explains epigastric pain to a significant  degree  RECOMMENDATIONS: Stop omeprazole; begin Dexfilant 60 mg daily. Office visit with [place] in 6 weeks.  REPEAT EXAM:  eSigned:  R. Roetta Sessions, MD Jerrel Ivory [facility] 2015/11/21 2:06 PM    CC:  CPT CODES: ICD CODES:  The ICD and CPT codes recommended by this software are interpretations from the data that the clinical staff has captured with the software.  The verification of the translation of this report to the ICD and CPT codes and modifiers is the sole responsibility of the health care institution and practicing physician where this report was generated.  [employer] will not be held responsible for the validity of the ICD and CPT codes included on this report.  AMA assumes no liability for data contained or not contained herein. CPT is a Publishing rights manager of the [employer].

## 2015-11-13 NOTE — Interval H&P Note (Signed)
History and Physical Interval Note:  11/13/2015 1:25 PM  Karen Shields  has presented today for surgery, with the diagnosis of GERD  The various methods of treatment have been discussed with the patient and family. After consideration of risks, benefits and other options for treatment, the patient has consented to  Procedure(s) with comments: ESOPHAGOGASTRODUODENOSCOPY (EGD) (N/A) - 145 as a surgical intervention .  The patient's history has been reviewed, patient examined, no change in status, stable for surgery.  I have reviewed the patient's chart and labs.  Questions were answered to the patient's satisfaction.     Robert Rourk  No change. Diagnostic EGD per plan. Patient denies dysphagia.  The risks, benefits, limitations, alternatives and imponderables have been reviewed with the patient. Potential for esophageal dilation, biopsy, etc. have also been reviewed.  Questions have been answered. All parties agreeable.

## 2015-11-14 ENCOUNTER — Telehealth: Payer: Self-pay | Admitting: Family Medicine

## 2015-11-14 ENCOUNTER — Encounter (HOSPITAL_COMMUNITY): Payer: Self-pay | Admitting: Internal Medicine

## 2015-11-14 NOTE — Telephone Encounter (Signed)
Calling to request Rx for alprazolam.   [place]

## 2015-11-14 NOTE — Telephone Encounter (Signed)
Last seen for anxiety nov 2016

## 2015-11-17 ENCOUNTER — Other Ambulatory Visit: Payer: Self-pay | Admitting: Nurse Practitioner

## 2015-11-17 MED ORDER — ALPRAZOLAM 1 MG PO TABS: 0.5000 mg | ORAL_TABLET | Freq: Two times a day (BID) | ORAL | Status: DC | PRN

## 2015-11-17 NOTE — Telephone Encounter (Signed)
Patient calling to check on this message.  Please advise.  She is requesting that [place] call her.

## 2015-11-17 NOTE — Telephone Encounter (Signed)
Refill done.  

## 2015-11-17 NOTE — Telephone Encounter (Signed)
Patient was notified.

## 2015-11-19 ENCOUNTER — Ambulatory Visit: Payer: Medicaid Other | Admitting: Nurse Practitioner

## 2015-11-21 ENCOUNTER — Ambulatory Visit (INDEPENDENT_AMBULATORY_CARE_PROVIDER_SITE_OTHER): Payer: Self-pay | Admitting: Nurse Practitioner

## 2015-11-21 ENCOUNTER — Encounter: Payer: Self-pay | Admitting: Nurse Practitioner

## 2015-11-21 VITALS — BP 122/72 | Temp 98.0°F | Ht 62.0 in | Wt 154.4 lb

## 2015-11-21 DIAGNOSIS — F419 Anxiety disorder, unspecified: Secondary | ICD-10-CM

## 2015-11-21 DIAGNOSIS — R6881 Early satiety: Secondary | ICD-10-CM

## 2015-11-21 DIAGNOSIS — K219 Gastro-esophageal reflux disease without esophagitis: Secondary | ICD-10-CM

## 2015-11-21 MED ORDER — TRAZODONE HCL 50 MG PO TABS: 25.0000 mg | ORAL_TABLET | Freq: Every evening | ORAL | Status: DC | PRN

## 2015-11-21 NOTE — Patient Instructions (Addendum)
Dr. Kendell Baneourke wants you to start Dexilant 60 mg each day Anxiety: lexapro, xanax and trazodone

## 2015-11-22 ENCOUNTER — Encounter: Payer: Self-pay | Admitting: Nurse Practitioner

## 2015-11-22 LAB — TSH: TSH: 0.665 u[IU]/mL (ref 0.450–4.500)

## 2015-11-22 NOTE — Progress Notes (Signed)
Subjective:  Presents to discuss her anxiety. Is having frequent panic attacks. Now having them about every day. Lost one of her best friends this week. States her relationship with her husband is doing well at this point. Denies any suicidal or homicidal thoughts or ideation. Having diarrhea several times a day. Also upper mid abdominal pain. Continues to have early satiety. Nausea but no vomiting. Was seen by GI specialist and had endoscopy. According to Dr. Aura Feyourke's notes, she was supposed to stop omeprazole and start dexilant samples to be picked up at his office. Sleeping well at nighttime as long as her youngest child does not disrupt her sleep. Admits that she recently was arrested [facility] shoplifting [facility] [employer]. States she did it for financial reasons. Has not done anything like this before and does not plan to do this again.   Objective:   BP 122/72 mmHg  Temp(Src) 98 F (36.7 C) (Oral)  Ht 5\' 2"  (1.575 m)  Wt 154 lb 6.4 oz (70.035 kg)  BMI 28.23 kg/m2  LMP 10/22/2015 (Approximate) NAD. Alert, moderately anxious affect. Difficulty focusing patient on questions. Lungs clear. Heart regular rate rhythm. Abdomen soft nondistended with mild epigastric area tenderness.  Assessment:  Problem List Items Addressed This Visit      Digestive   GERD (gastroesophageal reflux disease) - Primary     Other   Anxiety   Relevant Medications   traZODone (DESYREL) 50 MG tablet   Early satiety     Plan:  Meds ordered this encounter  Medications  . traZODone (DESYREL) 50 MG tablet    Sig: Take 0.5-1 tablets (25-50 mg total) by mouth at bedtime as needed for sleep.    Dispense:  30 tablet    Refill:  3    Order Specific Question:  Supervising Provider    Answer:  Riccardo DubinLUKING, WILLIAM S [[id number]]  Recommend mental health referral.  Patient remains uninsured. Is working on getting her insurance set up. Cannot afford private psychiatric care at this point. Prefers not to go to local mental health they use a  sliding scale. Add trazodone to her current regimen. Written instructions given to patient. No increase in Xanax per month. Also encouraged her to pick up Dexilant and start today.  Return in about 3 months (around 02/21/2016) for recheck. Call back sooner if any problems or if she gets private insurance so we can look into referral to mental health. Encouraged patient to look into a part time job to help with finances.

## 2015-12-15 ENCOUNTER — Other Ambulatory Visit: Payer: Self-pay | Admitting: Nurse Practitioner

## 2015-12-15 NOTE — Telephone Encounter (Signed)
Ok plus 2 monthly ref 

## 2015-12-16 ENCOUNTER — Telehealth: Payer: Self-pay | Admitting: Internal Medicine

## 2015-12-16 DIAGNOSIS — R197 Diarrhea, unspecified: Secondary | ICD-10-CM

## 2015-12-16 DIAGNOSIS — R1011 Right upper quadrant pain: Secondary | ICD-10-CM

## 2015-12-16 NOTE — Telephone Encounter (Signed)
Spoke with the pt, she said she has not had an appetite since she came in for her ov. She has had diarrhea 3 x a day, slightly watery but seems more greasy than watery. No blood in her stool, no black stools, no vomiting +nausea, no fever. Pain is worse than when she had her ov. RUQ pain. She has ov on 4/6 but she doesn't think she can take it any longer. She was on abx about 4-5 weeks ago for a UTI, she cant remember the name of the abx.  I informed pt that it will be tomorrow before I can get back with her (LSL and RMR have already left for the day) and she said she was in pain while talking to me. I advised her to go to the ED if she worsens prior to hearing back from me. Pt verbalized understanding instructions.

## 2015-12-16 NOTE — Telephone Encounter (Signed)
[phone]  PLEASE CALL PATIENT REGARDING STOMACH PAIN.

## 2015-12-17 NOTE — Telephone Encounter (Signed)
Pt is aware. She is concerned because she doesn't have any insurance. I have given her the number for [facility] and she is going to call them to ask them if they have any assistance programs available. Lab orders have been faxed to [facility]. Pt has ov with us next week. She has not been taking the dexilant d/t she didn't remember picking them up after her procedure. She found them as we were on the phone and said she will start taking them today.

## 2015-12-17 NOTE — Telephone Encounter (Signed)
Reviewed records. EGD showed reflux esophagitis. Let's make sure she did switch to Dexilant per RMR recommendations.  I think we should check a Cdiff PCR given her h/o recent antibiotics and diarrhea. Will also check fecal elastase given greasy stools and celiac screen. We need to check labs (LFTs, Lipase). This have not been done since 06/2015 and with worsening symptoms we need to make sure gallstones are not moving and causing problems.   Orders place for Cdiff PCR, LFTs, lipase, TTG, IGA, fecal elastase.

## 2015-12-17 NOTE — Addendum Note (Signed)
Addended by: Tiffany KocherLEWIS, Jeneal Vogl S on: 12/17/2015 12:03 PM   Modules accepted: Orders

## 2015-12-18 ENCOUNTER — Telehealth: Payer: Self-pay | Admitting: Internal Medicine

## 2015-12-18 ENCOUNTER — Emergency Department (HOSPITAL_COMMUNITY)
Admission: EM | Admit: 2015-12-18 | Discharge: 2015-12-19 | Disposition: A | Discharge status: Home / Self Care | Payer: MEDICAID | Attending: Emergency Medicine | Admitting: Emergency Medicine

## 2015-12-18 ENCOUNTER — Encounter (HOSPITAL_COMMUNITY): Payer: Self-pay

## 2015-12-18 DIAGNOSIS — R51 Headache: Secondary | ICD-10-CM | POA: Insufficient documentation

## 2015-12-18 DIAGNOSIS — F17210 Nicotine dependence, cigarettes, uncomplicated: Secondary | ICD-10-CM | POA: Insufficient documentation

## 2015-12-18 DIAGNOSIS — R1011 Right upper quadrant pain: Secondary | ICD-10-CM

## 2015-12-18 DIAGNOSIS — Z79899 Other long term (current) drug therapy: Secondary | ICD-10-CM | POA: Insufficient documentation

## 2015-12-18 DIAGNOSIS — K8020 Calculus of gallbladder without cholecystitis without obstruction: Secondary | ICD-10-CM | POA: Insufficient documentation

## 2015-12-18 DIAGNOSIS — R197 Diarrhea, unspecified: Secondary | ICD-10-CM | POA: Insufficient documentation

## 2015-12-18 DIAGNOSIS — J45909 Unspecified asthma, uncomplicated: Secondary | ICD-10-CM | POA: Insufficient documentation

## 2015-12-18 HISTORY — DX: Calculus of gallbladder without cholecystitis without obstruction: K80.20

## 2015-12-18 LAB — URINALYSIS, ROUTINE W REFLEX MICROSCOPIC
Bilirubin Urine: NEGATIVE
Glucose, UA: NEGATIVE mg/dL
Ketones, ur: NEGATIVE mg/dL
LEUKOCYTES UA: NEGATIVE
NITRITE: NEGATIVE
PH: 6 (ref 5.0–8.0)
Protein, ur: 30 mg/dL — AB
SPECIFIC GRAVITY, URINE: 1.03 (ref 1.005–1.030)

## 2015-12-18 LAB — CBC WITH DIFFERENTIAL/PLATELET
BASOS ABS: 0 10*3/uL (ref 0.0–0.1)
Basophils Relative: 0 %
Eosinophils Absolute: 0.3 10*3/uL (ref 0.0–0.7)
Eosinophils Relative: 3 %
HEMATOCRIT: 37.4 % (ref 36.0–46.0)
Hemoglobin: 12.7 g/dL (ref 12.0–15.0)
LYMPHS PCT: 45 %
Lymphs Abs: 4.2 10*3/uL — ABNORMAL HIGH (ref 0.7–4.0)
MCH: 30.1 pg (ref 26.0–34.0)
MCHC: 34 g/dL (ref 30.0–36.0)
MCV: 88.6 fL (ref 78.0–100.0)
MONO ABS: 0.7 10*3/uL (ref 0.1–1.0)
Monocytes Relative: 7 %
NEUTROS ABS: 4.2 10*3/uL (ref 1.7–7.7)
Neutrophils Relative %: 45 %
Platelets: 321 10*3/uL (ref 150–400)
RBC: 4.22 MIL/uL (ref 3.87–5.11)
RDW: 13.3 % (ref 11.5–15.5)
WBC: 9.4 10*3/uL (ref 4.0–10.5)

## 2015-12-18 LAB — BASIC METABOLIC PANEL
ANION GAP: 4 — AB (ref 5–15)
BUN: 8 mg/dL (ref 6–20)
CALCIUM: 8.2 mg/dL — AB (ref 8.9–10.3)
CHLORIDE: 105 mmol/L (ref 101–111)
CO2: 25 mmol/L (ref 22–32)
Creatinine, Ser: 0.74 mg/dL (ref 0.44–1.00)
GFR calc Af Amer: >60 mL/min (ref >60)
GFR calc non Af Amer: >60 mL/min (ref >60)
GLUCOSE: 91 mg/dL (ref 65–99)
POTASSIUM: 3.5 mmol/L (ref 3.5–5.1)
Sodium: 134 mmol/L — ABNORMAL LOW (ref 135–145)

## 2015-12-18 LAB — URINE MICROSCOPIC-ADD ON

## 2015-12-18 LAB — LIPASE, BLOOD: Lipase: 36 U/L (ref 11–51)

## 2015-12-18 NOTE — ED Notes (Signed)
Pt reports recently being diagnosed with gallstones.  RLQ and right shoulder pain reported. Supposed to follow up with GI April 6th, however pain has gotten more intense.

## 2015-12-18 NOTE — Telephone Encounter (Signed)
LMOM TO GO TO THE ER SINCE HER PAIN IS AT A 9

## 2015-12-18 NOTE — ED Provider Notes (Signed)
CSN: [id number]     Arrival date & time 12/18/15  2059 History  By signing my name below, I, [facility], attest that this documentation has been prepared under the direction and in the presence of Devoria Albe, MD at 0015 AM. Electronically Signed: Randell Patient, ED Scribe. 12/19/2015. 4:59 AM.   Chief Complaint  Patient presents with  . Abdominal Pain   The history is provided by the patient. No language interpreter was used.   HPI Comments: Karen Shields is a 25 y.o. female with a PMHx of GERD and gallstones who presents to the Emergency Department complaining of constant, gradually worsening abdominal pain onset 2 week ago, worse today in her RUQ and in her right shoulder blade and back area. She has been having the pain off and on for the past 3 months.. Patient reports that she was sent to the hospital for testing by her PCP 3 months ago and she was diagnosed with gallstones. She was referred out to a gastroenterlogist. She endorses associated loss of appetite,, nausea, diarrhea 5x daily (loose and watery), subjective fever and chills, intermittent dizziness and lightheadedness, and right shoulder pain today. Pain has no alleviating or exacerbating factors. She states that she had a endoscopy recently where she was told that she had a lot of acid and she was placed on a acid reducer medication which she states she still taking. She has not been seen by a surgeon about her gallstones. Smokes 14 cigarettes a day but decreased to 3 a day 2 days ago due to pending surgery. Denies speaking with a Careers adviser. Denies any other symptoms. G2P2A0. She does state that alcohol makes the pain worse.  PCP Dr Gerda Diss GI Dr Luvenia Starch office  Past Medical History  Diagnosis Date  . Eczema 2008  . Asthma   . Ovarian cyst   . Asthma   . Anxiety   . GERD (gastroesophageal reflux disease)   . Gallstones    Past Surgical History  Procedure Laterality Date  . Ovarian cyst removal    . Ovarian cyst  removal  2011  . Tubal ligation    . Esophagogastroduodenoscopy N/A 11/13/2015    Procedure: ESOPHAGOGASTRODUODENOSCOPY (EGD);  Surgeon: Corbin Ade, MD;  Location: [facility];  Service: Endoscopy;  Laterality: N/A;  145   Family History  Problem Relation Age of Onset  . Diabetes Mother   . Colon cancer Neg Hx   . Crohn's disease Neg Hx   . Celiac disease Neg Hx    Social History  Substance Use Topics  . Smoking status: Current Every Day Smoker -- 0.50 packs/day for 5 years    Types: Cigarettes    Start date: 02/11/2006  . Smokeless tobacco: Never Used  . Alcohol Use: 0.0 oz/week    0 Standard drinks or equivalent per week     Comment: occassionally   Stay at home mother  OB History    Gravida Para Term Preterm AB TAB SAB Ectopic Multiple Living   2 2 1  0 0 0 0 0 0 2     Review of Systems  Constitutional: Positive for fever, chills and appetite change (loss of appetite).  Gastrointestinal: Positive for nausea, abdominal pain and diarrhea. Negative for vomiting.  Musculoskeletal: Positive for myalgias (right shoulder).  Neurological: Positive for dizziness and headaches.  All other systems reviewed and are negative.   Allergies  Latex  Home Medications   Prior to Admission medications   Medication Sig Start Date End  Date Taking? Authorizing Provider  albuterol (PROVENTIL HFA;VENTOLIN HFA) 108 (90 BASE) MCG/ACT inhaler Inhale 2 puffs into the lungs every 6 (six) hours as needed for wheezing. 11/08/14   Campbell Riches, NP  ALPRAZolam Prudy Feeler) 1 MG tablet TAKE 1/2 TO 1 TABLET BY MOUTH TWICE DAILY AS NEEDED FOR ANXIETY. 12/15/15   Merlyn Albert, MD  azithromycin [facility]) 250 MG tablet Take as directed 11/10/15   Merlyn Albert, MD  betamethasone dipropionate (DIPROLENE) 0.05 % cream Apply topically 2 (two) times daily. 11/08/14   Campbell Riches, NP  cephALEXin (KEFLEX) 500 MG capsule Take 1 capsule (500 mg total) by mouth 4 (four) times daily. 11/06/15   Vanetta Mulders, MD  escitalopram (LEXAPRO) 20 MG tablet TAKE ONE TABLET BY MOUTH ONCE DAILY. 12/15/15   Merlyn Albert, MD  HYDROcodone-acetaminophen (NORCO/VICODIN) 5-325 MG per tablet Take 2 tablets by mouth every 6 (six) hours as needed for moderate pain. 04/12/15   Marily Memos, MD  ibuprofen (ADVIL,MOTRIN) 600 MG tablet Take 1 tablet (600 mg total) by mouth 4 (four) times daily. 03/11/15   Ivery Quale, PA-C  meclizine (ANTIVERT) 25 MG tablet Take 1 tablet (25 mg total) by mouth 3 (three) times daily as needed for dizziness. 11/06/15   Vanetta Mulders, MD  naproxen (NAPROSYN) 500 MG tablet Take 1 tablet (500 mg total) by mouth 2 (two) times daily with a meal. Prn migraine 11/27/14   Campbell Riches, NP  ondansetron (ZOFRAN ODT) 4 MG disintegrating tablet Take 1 tablet (4 mg total) by mouth every 8 (eight) hours as needed. 11/06/15   Vanetta Mulders, MD  ondansetron (ZOFRAN) 4 MG tablet Take 1 tablet (4 mg total) by mouth every 8 (eight) hours as needed for nausea or vomiting. 12/19/15   Devoria Albe, MD  ondansetron (ZOFRAN-ODT) 8 MG disintegrating tablet Take 1 tablet (8 mg total) by mouth every 8 (eight) hours as needed for nausea or vomiting. 10/14/15   Campbell Riches, NP  topiramate (TOPAMAX) 50 MG tablet TAKE 1 TABLET BY MOUTH AT BEDTIME FOR MIGRAINES. 10/14/15   Campbell Riches, NP  traMADol (ULTRAM) 50 MG tablet Take 2 tablets (100 mg total) by mouth every 6 (six) hours as needed for moderate pain. 12/19/15   Devoria Albe, MD  traZODone (DESYREL) 50 MG tablet Take 0.5-1 tablets (25-50 mg total) by mouth at bedtime as needed for sleep. 11/21/15   Campbell Riches, NP   BP 101/65 mmHg  Pulse 54  Temp(Src) 98 F (36.7 C) (Oral)  Resp 16  Ht  (1.6 m)  Wt 155 lb (70.308 kg)  BMI 27.46 kg/m2  SpO2 98%  LMP 12/17/2015  Vital signs normal except bradycardia  Physical Exam  Constitutional: She is oriented to person, place, and time. She appears well-developed and well-nourished.  Non-toxic  appearance. She does not appear ill. No distress.  Appears uncomfortable.  HENT:  Head: Normocephalic and atraumatic.  Right Ear: External ear normal.  Left Ear: External ear normal.  Nose: Nose normal. No mucosal edema or rhinorrhea.  Mouth/Throat: Oropharynx is clear and moist and mucous membranes are normal. No dental abscesses or uvula swelling.  Eyes: Conjunctivae and EOM are normal. Pupils are equal, round, and reactive to light.  Neck: Normal range of motion and full passive range of motion without pain. Neck supple.  Cardiovascular: Normal rate, regular rhythm and normal heart sounds.  Exam reveals no gallop and no friction rub.   No murmur heard. Pulmonary/Chest: Effort  normal and breath sounds normal. No respiratory distress. She has no wheezes. She has no rhonchi. She has no rales. She exhibits no tenderness and no crepitus.  Abdominal: Soft. Normal appearance and bowel sounds are normal. She exhibits no distension. There is tenderness. There is no rebound and no guarding.  Very tender in RUQ.  Musculoskeletal: Normal range of motion. She exhibits no edema or tenderness.  Moves all extremities well.   Neurological: She is alert and oriented to person, place, and time. She has normal strength. No cranial nerve deficit.  Skin: Skin is warm, dry and intact. No rash noted. No erythema. No pallor.  Psychiatric: She has a normal mood and affect. Her speech is normal and behavior is normal. Her mood appears not anxious.  Nursing note and vitals reviewed.   ED Course  Procedures   Medications  sodium chloride 0.9 % bolus 1,000 mL (0 mLs Intravenous Stopped 12/19/15 0146)  sodium chloride 0.9 % bolus 500 mL (0 mLs Intravenous Stopped 12/19/15 0206)  ondansetron (ZOFRAN) injection 4 mg (4 mg Intravenous Given 12/19/15 0041)  fentaNYL (SUBLIMAZE) injection 50 mcg (50 mcg Intravenous Given 12/19/15 0041)  sodium chloride 0.9 % bolus 1,000 mL (0 mLs Intravenous Stopped 12/19/15 0358)   fentaNYL (SUBLIMAZE) injection 50 mcg (50 mcg Intravenous Given 12/19/15 0206)     DIAGNOSTIC STUDIES: Oxygen Saturation is 100% on RA, normal by my interpretation.    COORDINATION OF CARE: 12:15 AM Will order IV fluids, Zofran and IV pain medication. Discussed results of labs. Discussed treatment plan with pt at bedside and pt agreed to plan.  Recheck at 1:55 AM patient still having some pain. She was given additional pain medication. We discussed her laboratory results which at this point show no obvious inflammation of her gallbladder. She has been having constant pain for 2 weeks and she was advised she really needs to follow-up with a surgeon to discuss getting her gallbladder removed.  Recheck it for a.m. Patient is sleeping soundly. She states her pain is improved. We discussed returning to get ultrasound done of her gallbladder again to make sure there is no inflammation of her gallbladder. Her lab work is normal and at this point I do not feel that she needs to be admitted to hospital. She is going to return later this afternoon at 1:45 PM to get her ultrasound done.  Patient had ultrasound of the abdomen done on January 16 which showed gallstones without cholecystitis. On January 19 she had a nuclear medicine hepatic scan that showed normal function of her gallbladder.  Labs Review Results for orders placed or performed during the hospital encounter of 12/18/15  CBC with Differential  Result Value Ref Range   WBC 9.4 4.0 - 10.5 K/uL   RBC 4.22 3.87 - 5.11 MIL/uL   Hemoglobin 12.7 12.0 - 15.0 g/dL   HCT 60.4 54.0 - 98.1 %   MCV 88.6 78.0 - 100.0 fL   MCH 30.1 26.0 - 34.0 pg   MCHC 34.0 30.0 - 36.0 g/dL   RDW 19.1 47.8 - 29.5 %   Platelets 321 150 - 400 K/uL   Neutrophils Relative % 45 %   Neutro Abs 4.2 1.7 - 7.7 K/uL   Lymphocytes Relative 45 %   Lymphs Abs 4.2 (H) 0.7 - 4.0 K/uL   Monocytes Relative 7 %   Monocytes Absolute 0.7 0.1 - 1.0 K/uL   Eosinophils Relative 3 %    Eosinophils Absolute 0.3 0.0 - 0.7 K/uL  Basophils Relative 0 %   Basophils Absolute 0.0 0.0 - 0.1 K/uL  Basic metabolic panel  Result Value Ref Range   Sodium 134 (L) 135 - 145 mmol/L   Potassium 3.5 3.5 - 5.1 mmol/L   Chloride 105 101 - 111 mmol/L   CO2 25 22 - 32 mmol/L   Glucose, Bld 91 65 - 99 mg/dL   BUN 8 6 - 20 mg/dL   Creatinine, Ser 0.270.74 0.44 - 1.00 mg/dL   Calcium 8.2 (L) 8.9 - 10.3 mg/dL   GFR calc non Af Amer >60 >60 mL/min   GFR calc Af Amer >60 >60 mL/min   Anion gap 4 (L) 5 - 15  Lipase, blood  Result Value Ref Range   Lipase 36 11 - 51 U/L  Urinalysis, Routine w reflex microscopic ([facility])  Result Value Ref Range   Color, Urine YELLOW YELLOW   APPearance CLEAR CLEAR   Specific Gravity, Urine 1.030 1.005 - 1.030   pH 6.0 5.0 - 8.0   Glucose, UA NEGATIVE NEGATIVE mg/dL   Hgb urine dipstick LARGE (A) NEGATIVE   Bilirubin Urine NEGATIVE NEGATIVE   Ketones, ur NEGATIVE NEGATIVE mg/dL   Protein, ur 30 (A) NEGATIVE mg/dL   Nitrite NEGATIVE NEGATIVE   Leukocytes, UA NEGATIVE NEGATIVE  Urine microscopic-add on  Result Value Ref Range   Squamous Epithelial / LPF 0-5 (A) NONE SEEN   WBC, UA 0-5 0 - 5 WBC/hpf   RBC / HPF 6-30 0 - 5 RBC/hpf   Bacteria, UA RARE (A) NONE SEEN  Hepatic function panel  Result Value Ref Range   Total Protein 6.6 6.5 - 8.1 g/dL   Albumin 4.0 3.5 - 5.0 g/dL   AST 16 15 - 41 U/L   ALT 13 (L) 14 - 54 U/L   Alkaline Phosphatase 87 38 - 126 U/L   Total Bilirubin 0.3 0.3 - 1.2 mg/dL   Bilirubin, Direct <2.5<0.1 (L) 0.1 - 0.5 mg/dL   Indirect Bilirubin NOT CALCULATED 0.3 - 0.9 mg/dL   Laboratory interpretation all normal     MDM   Final diagnoses:  RUQ pain  Gallstones    New Prescriptions   ONDANSETRON (ZOFRAN) 4 MG TABLET    Take 1 tablet (4 mg total) by mouth every 8 (eight) hours as needed for nausea or vomiting.   TRAMADOL (ULTRAM) 50 MG TABLET    Take 2 tablets (100 mg total) by mouth every 6 (six) hours as needed for  moderate pain.    Plan discharge  Devoria AlbeIva Amarah Brossman, MD, FACEP  I personally performed the services described in this documentation, which was scribed in my presence. The recorded information has been reviewed and considered.  Devoria AlbeIva Saleah Rishel, MD, Concha PyoFACEP      Lalo Tromp, MD 12/19/15 0500

## 2015-12-18 NOTE — Telephone Encounter (Signed)
Pt called late this afternoon saying that she found her Dexilant and has taken one and now her back and right side are in severe pain and said her pain level was a 9 and she didn't know what to do. I told her that she has an appt with us next Thursday and if her pain level was that high that she should go to the ED.

## 2015-12-19 ENCOUNTER — Ambulatory Visit (HOSPITAL_COMMUNITY)
Admission: RE | Admit: 2015-12-19 | Discharge: 2015-12-19 | Disposition: A | Discharge status: Home / Self Care | Payer: Self-pay | Source: Ambulatory Visit | Attending: Emergency Medicine | Admitting: Emergency Medicine

## 2015-12-19 DIAGNOSIS — R600 Localized edema: Secondary | ICD-10-CM | POA: Insufficient documentation

## 2015-12-19 DIAGNOSIS — K8020 Calculus of gallbladder without cholecystitis without obstruction: Secondary | ICD-10-CM | POA: Insufficient documentation

## 2015-12-19 LAB — HEPATIC FUNCTION PANEL
ALK PHOS: 87 U/L (ref 38–126)
ALT: 13 U/L — ABNORMAL LOW (ref 14–54)
AST: 16 U/L (ref 15–41)
Albumin: 4 g/dL (ref 3.5–5.0)
Bilirubin, Direct: <0.1 mg/dL — ABNORMAL LOW (ref 0.1–0.5)
TOTAL PROTEIN: 6.6 g/dL (ref 6.5–8.1)
Total Bilirubin: 0.3 mg/dL (ref 0.3–1.2)

## 2015-12-19 MED ORDER — SODIUM CHLORIDE 0.9 % IV BOLUS (SEPSIS)
1000.0000 mL | Freq: Once | INTRAVENOUS | Status: AC
  Administered 2015-12-19: 1000 mL via INTRAVENOUS

## 2015-12-19 MED ORDER — FENTANYL CITRATE (PF) 100 MCG/2ML IJ SOLN
50.0000 ug | Freq: Once | INTRAMUSCULAR | Status: AC
  Administered 2015-12-19: 50 ug via INTRAVENOUS
  Filled 2015-12-19: qty 2

## 2015-12-19 MED ORDER — ONDANSETRON HCL 4 MG/2ML IJ SOLN
4.0000 mg | Freq: Once | INTRAMUSCULAR | Status: AC
  Administered 2015-12-19: 4 mg via INTRAVENOUS
  Filled 2015-12-19: qty 2

## 2015-12-19 MED ORDER — SODIUM CHLORIDE 0.9 % IV BOLUS (SEPSIS)
500.0000 mL | Freq: Once | INTRAVENOUS | Status: AC
  Administered 2015-12-19: 500 mL via INTRAVENOUS

## 2015-12-19 MED ORDER — ONDANSETRON HCL 4 MG PO TABS: 4.0000 mg | ORAL_TABLET | Freq: Three times a day (TID) | ORAL | Status: DC | PRN

## 2015-12-19 MED ORDER — TRAMADOL HCL 50 MG PO TABS: 100.0000 mg | ORAL_TABLET | Freq: Four times a day (QID) | ORAL | Status: DC | PRN

## 2015-12-19 NOTE — ED Provider Notes (Signed)
She returned today for ultrasound, which is been performed, and read by radiology. I reviewed the interpretation. There is a questionable mild early cholecystitis. Patient's white count and temperature were normal yesterday. At this time, she is comfortable. She has not yet filled the prescriptions which were given early this morning. She has been given a referral to surgery, Dr. Lovell SheehanJenkins, and understands that she needs to call him for an appointment. She is given precautions to return here for worsening pain, fever, or inability to eat.  Mancel BaleElliott Perrin Gens, MD 12/19/15 [phone]

## 2015-12-19 NOTE — Discharge Instructions (Signed)
Do not eat or drink anything after 8 AM this morning. You're scheduled to get a ultrasound of your gallbladder again at 145 this afternoon. Check in at radiology. Arrived 15 minutes early. Avoid fried, spicy, greasy foods. Take the tramadol with acetaminophen 1000 mg 4 times a day for pain. Follow-up with Dr. Lovell SheehanJenkins, general surgeon to discuss removing you Biliary Colic Biliary colic is a pain in the upper abdomen. The pain:  Is usually felt on the right side of the abdomen, but it may also be felt in the center of the abdomen, just below the breastbone (sternum).  May spread back toward the right shoulder blade.  May be steady or irregular.  May be accompanied by nausea and vomiting. Most of the time, the pain goes away in 1-5 hours. After the most intense pain passes, the abdomen may continue to ache mildly for about 24 hours. Biliary colic is caused by a blockage in the bile duct. The bile duct is a pathway that carries bile--a liquid that helps to digest fats--from the gallbladder to the small intestine. Biliary colic usually occurs after eating, when the digestive system demands bile. The pain develops when muscle cells contract forcefully to try to move the blockage so that bile can get by. HOME CARE INSTRUCTIONS  Take medicines only as directed by your health care provider.  Drink enough fluid to keep your urine clear or pale yellow.  Avoid fatty, greasy, and fried foods. These kinds of foods increase your body's demand for bile.  Avoid any foods that make your pain worse.  Avoid overeating.  Avoid having a large meal after fasting. SEEK MEDICAL CARE IF:  You develop a fever.  Your pain gets worse.  You vomit.  You develop nausea that prevents you from eating and drinking. SEEK IMMEDIATE MEDICAL CARE IF:  You suddenly develop a fever and shaking chills.  You develop a yellowish discoloration (jaundice) of:  Skin.  Whites of the eyes.  Mucous membranes.  You have  continuous or severe pain that is not relieved with medicines.  You have nausea and vomiting that is not relieved with medicines.  You develop dizziness or you faint.   This information is not intended to replace advice given to you by your health care provider. Make sure you discuss any questions you have with your health care provider.   Document Released: 02/07/2006 Document Revised: 01/21/2015 Document Reviewed: 06/18/2014 Elsevier Interactive Patient Education 2016 [employer] r gallbladder.

## 2015-12-25 ENCOUNTER — Telehealth: Payer: Self-pay

## 2015-12-25 ENCOUNTER — Ambulatory Visit (INDEPENDENT_AMBULATORY_CARE_PROVIDER_SITE_OTHER): Payer: Medicaid Other | Admitting: Gastroenterology

## 2015-12-25 ENCOUNTER — Other Ambulatory Visit: Payer: Self-pay

## 2015-12-25 ENCOUNTER — Encounter: Payer: Self-pay | Admitting: Gastroenterology

## 2015-12-25 VITALS — BP 120/64 | HR 68 | Temp 98.1°F | Ht 63.0 in | Wt 153.0 lb

## 2015-12-25 DIAGNOSIS — K8018 Calculus of gallbladder with other cholecystitis without obstruction: Secondary | ICD-10-CM | POA: Diagnosis not present

## 2015-12-25 DIAGNOSIS — R1011 Right upper quadrant pain: Secondary | ICD-10-CM | POA: Diagnosis not present

## 2015-12-25 DIAGNOSIS — K2100 Gastro-esophageal reflux disease with esophagitis, without bleeding: Secondary | ICD-10-CM

## 2015-12-25 MED ORDER — HYDROCODONE-ACETAMINOPHEN 5-325 MG PO TABS: 1.0000 | ORAL_TABLET | Freq: Four times a day (QID) | ORAL | Status: DC | PRN

## 2015-12-25 NOTE — Progress Notes (Signed)
cc'ed to pcp °

## 2015-12-25 NOTE — Progress Notes (Signed)
Primary Care Physician: Lubertha South, MD  Primary Gastroenterologist:  Roetta Sessions, MD   Chief Complaint  Patient presents with  . Follow-up    HPI: Karen Shields is a 25 y.o. female here For further evaluation of right upper quadrant pain. When I saw her back in February she was complaining mostly of nausea the time. She had intermittent vomiting as well. Loss of appetite. Greater than 10 pound weight loss. She had an abdominal ultrasound showing cholelithiasis, HIDA scan was unremarkable. She underwent EGD for further evaluation of her symptoms. She was noted to have erosive reflux esophagitis felt to be the likely etiology of her symptoms and her epigastric pain. We switched her from omeprazole to Dexilant. She states she did not recall coming by picking up the Dexilant postprocedure because of sedation wearing off. She started this last week what she called in here we informed her of instructions.  Overall her nausea has improved. She developed right upper quadrant abdominal pain about a week ago which was unrelenting. Radiates into the right shoulder. Worse with meals. Last for 1-2 hours at a time. This led to an ER visit and an additional ultrasound. She had cholelithiasis which was artery known, this time for gallbladder wall appears slightly thickened and subtly edematous indicative of possible early acute cholecystitis. Her labs remained unremarkable as outlined below.  Patient also has complaints of diarrhea, greasy stools. History of antibiotics for UTI about 6 weeks ago. We advised C. difficile PCR, labs, fecal elastase, celiac screen but patient reports not having insurance so she did not have it done.      Current Outpatient Prescriptions  Medication Sig Dispense Refill  . albuterol (PROVENTIL HFA;VENTOLIN HFA) 108 (90 BASE) MCG/ACT inhaler Inhale 2 puffs into the lungs every 6 (six) hours as needed for wheezing. 18 g 5  . ALPRAZolam (XANAX) 1 MG tablet TAKE 1/2 TO  1 TABLET BY MOUTH TWICE DAILY AS NEEDED FOR ANXIETY. 60 tablet 2  . dexlansoprazole (DEXILANT) 60 MG capsule Take 60 mg by mouth daily.    Marland Kitchen escitalopram (LEXAPRO) 20 MG tablet TAKE ONE TABLET BY MOUTH ONCE DAILY. 30 tablet 2  . ibuprofen (ADVIL,MOTRIN) 600 MG tablet Take 1 tablet (600 mg total) by mouth 4 (four) times daily. 30 tablet 0   No current facility-administered medications for this visit.    Allergies as of 12/25/2015 - Review Complete 12/25/2015  Allergen Reaction Noted  . Latex Dermatitis 11/04/2015    ROS:  General: Negative for anorexia, weight loss, fever, chills, fatigue, weakness. ENT: Negative for hoarseness, difficulty swallowing , nasal congestion. CV: Negative for chest pain, angina, palpitations, dyspnea on exertion, peripheral edema.  Respiratory: Negative for dyspnea at rest, dyspnea on exertion, cough, sputum, wheezing.  GI: See history of present illness. GU:  Negative for dysuria, hematuria, urinary incontinence, urinary frequency, nocturnal urination.  Endo: Negative for unusual weight change.    Physical Examination:   BP 120/64 mmHg  Pulse 68  Temp(Src) 98.1 F (36.7 C) (Oral)  Ht  (1.6 m)  Wt 153 lb (69.4 kg)  BMI 27.11 kg/m2  LMP 12/17/2015  General: Well-nourished, well-developed in no acute distress.  Eyes: No icterus. Mouth: Oropharyngeal mucosa moist and pink , no lesions erythema or exudate. Lungs: Clear to auscultation bilaterally.  Heart: Regular rate and rhythm, no murmurs rubs or gallops.  Abdomen: Bowel sounds are normal, mild to moderate right upper quadrant tenderness is down somewhat into the right midabdomen. No  right lower quadrant tenderness. nondistended, no hepatosplenomegaly or masses, no abdominal bruits or hernia , no rebound or guarding.   Extremities: No lower extremity edema. No clubbing or deformities. Neuro: Alert and oriented x 4   Skin: Warm and dry, no jaundice.   Psych: Alert and cooperative, normal mood  and affect.  Labs:  Lab Results  Component Value Date   CREATININE 0.74 12/18/2015   BUN 8 12/18/2015   NA 134* 12/18/2015   K 3.5 12/18/2015   CL 105 12/18/2015   CO2 25 12/18/2015   Lab Results  Component Value Date   ALT 13* 12/18/2015   AST 16 12/18/2015   ALKPHOS 87 12/18/2015   BILITOT 0.3 12/18/2015   Lab Results  Component Value Date   LIPASE 36 12/18/2015   Lab Results  Component Value Date   WBC 9.4 12/18/2015   HGB 12.7 12/18/2015   HCT 37.4 12/18/2015   MCV 88.6 12/18/2015   PLT 321 12/18/2015    Imaging Studies: [place] Abdomen Limited Ruq/gall Gladder  12/19/2015  CLINICAL DATA:  Upper abdominal pain. EXAM: US ABDOMEN LIMITED - RIGHT UPPER QUADRANT COMPARISON:  Ultrasound right upper quadrant October 06, 2015 FINDINGS: Gallbladder: Within the gallbladder, there are echogenic foci which move and shadow consistent with gallstones. Largest gallstone measures 7 mm in length. The gallbladder wall is slightly thickened and subtly edematous. No pericholecystic fluid. No sonographic Murphy sign noted by sonographer. Common bile duct: Diameter: 4 mm. There is no intrahepatic or extrahepatic biliary duct dilatation. Liver: No focal lesion identified. Within normal limits in parenchymal echogenicity. IMPRESSION: Cholelithiasis. Gallbladder wall appears slightly thickened and subtly edematous. These findings may be indicative of early acute cholecystitis. No pericholecystic fluid, however. Study otherwise unremarkable. Electronically Signed   By: Bretta BangWilliam  Woodruff III M.D.   On: 12/19/2015 14:40

## 2015-12-25 NOTE — Assessment & Plan Note (Signed)
25 year old female with history of cholelithiasis, recently developed right upper quadrant pain and likely has symptomatic disease at this point. Question early cholecystitis on ultrasound one week ago. Patient continues to have right upper quadrant pain, colicky in nature. Worse with meals. Complains of recent onset greasy loose stools. Recommend cholecystectomy in the near future. Patient is not sure if she remains with active insurance. We are looking into that for her. If she does not, then she can apply for [facility] Assistance.   If her symptoms worse, she should go back to the ER.

## 2015-12-25 NOTE — Telephone Encounter (Signed)
Pt called requesting something for pain. States she forgot to ask while she was here today at her appointment. States she just needs something to hold her off til her surgery is set up

## 2015-12-25 NOTE — Telephone Encounter (Signed)
Pt is aware to pick up RX for Vicodin

## 2015-12-25 NOTE — Assessment & Plan Note (Signed)
Continue Dexilant 60 mg 30 minutes before breakfast daily for at least 12 weeks. Samples provided. Will provide patient assistance forms if needed, patient is unclear whether or not she has insurance or Medicaid pending.

## 2015-12-25 NOTE — Telephone Encounter (Signed)
RX for vicodin. Also please give her dexilant #20 samples when she comes in.

## 2015-12-25 NOTE — Patient Instructions (Signed)
We are working on [employer] Corporationyour insurance concerns so we can send you to the appropriate surgeon to get your gallbladder out.   If your symptoms worsen, please go back to the ER.

## 2015-12-26 ENCOUNTER — Telehealth: Payer: Self-pay

## 2015-12-26 NOTE — Telephone Encounter (Signed)
Patient returned your phone call. Please return call.

## 2015-12-30 ENCOUNTER — Encounter: Payer: Self-pay | Admitting: Surgery

## 2015-12-30 ENCOUNTER — Telehealth: Payer: Self-pay | Admitting: Internal Medicine

## 2015-12-30 ENCOUNTER — Ambulatory Visit (INDEPENDENT_AMBULATORY_CARE_PROVIDER_SITE_OTHER): Payer: Medicaid Other | Admitting: Surgery

## 2015-12-30 VITALS — BP 133/77 | HR 64 | Temp 98.3°F | Ht 63.0 in | Wt 154.0 lb

## 2015-12-30 DIAGNOSIS — K8020 Calculus of gallbladder without cholecystitis without obstruction: Secondary | ICD-10-CM

## 2015-12-30 NOTE — Progress Notes (Signed)
Surgical Consultation  12/30/2015  Karen Shields is an 25 y.o. female.   CC: Right upper quadrant pain  HPI: This patient with recurrent and episodic right upper quadrant pain often associated with fatty food intolerance often occurring at night and she is at a workup including an EGD and has been placed on Prilosec which seemed to help somewhat but her pain persists and is fairly classic for gallbladder disease. She's had nausea and vomiting and some dizziness as well and consistently points the right upper quadrant and right shoulder denies fevers chills jaundice or acholic stools  Past Medical History  Diagnosis Date  . Eczema 2008  . Asthma   . Ovarian cyst   . Asthma   . Anxiety   . GERD (gastroesophageal reflux disease)   . Gallstones     Past Surgical History  Procedure Laterality Date  . Ovarian cyst removal    . Ovarian cyst removal  2011  . Tubal ligation    . Esophagogastroduodenoscopy N/A 11/13/2015    UJW:[id number] reflux     Family History  Problem Relation Age of Onset  . Diabetes Mother   . Colon cancer Neg Hx   . Crohn's disease Neg Hx   . Celiac disease Neg Hx     Social History:  reports that she has been smoking Cigarettes.  She started smoking about 9 years ago. She has a 2.5 pack-year smoking history. She has never used smokeless tobacco. She reports that she drinks alcohol. She reports that she does not use illicit drugs.  Allergies:  Allergies  Allergen Reactions  . Latex Dermatitis    Medications reviewed.   Review of Systems:   Review of Systems  Constitutional: Negative.   HENT: Negative.   Eyes: Negative.   Respiratory: Negative.   Cardiovascular: Negative.   Gastrointestinal: Positive for heartburn, nausea, vomiting and abdominal pain. Negative for diarrhea, constipation and blood in stool.  Genitourinary: Negative.   Musculoskeletal: Negative.   Skin: Negative.   Neurological: Negative.   Endo/Heme/Allergies: Negative.    Psychiatric/Behavioral: Negative.      Physical Exam:  BP 133/77 mmHg  Pulse 64  Temp(Src) 98.3 F (36.8 C) (Oral)  Ht  (1.6 m)  Wt 154 lb (69.854 kg)  BMI 27.29 kg/m2  LMP 12/17/2015  Physical Exam  Constitutional: She is oriented to person, place, and time and well-developed, well-nourished, and in no distress. No distress.  HENT:  Head: Normocephalic and atraumatic.  Multiple piercings  Eyes: Pupils are equal, round, and reactive to light. Right eye exhibits no discharge. Left eye exhibits no discharge. No scleral icterus.  Neck: Normal range of motion.  Cardiovascular: Normal rate, regular rhythm and normal heart sounds.   Pulmonary/Chest: Effort normal. No respiratory distress. She has wheezes. She has no rales.  Abdominal: Soft. Bowel sounds are normal. She exhibits no distension. There is no tenderness. There is no rebound.  Musculoskeletal: Normal range of motion. She exhibits no edema.  Lymphadenopathy:    She has no cervical adenopathy.  Neurological: She is alert and oriented to person, place, and time.  Skin: Skin is warm and dry. She is not diaphoretic.  Multiple Tattoos  Psychiatric: Mood and affect normal.  Vitals reviewed.     No results found for this or any previous visit (from the past 48 hour(s)). No results found.  Assessment/Plan:  This patient with multiple symptoms suggestive of biliary colic and is been going on for several months. I have recommended laparoscopic cholecystectomy  for control of her symptoms as they are fairly classic. The rationale for offering surgery was reviewed with her and her family the options of observation were discussed risk bleeding infection recurrence of symptoms failure to resolve her symptoms (procedure bile duct damage bile duct leak retained common bile duct stone any of which could require further surgery and/or ERCP stent papillotomy were all discussed she understood and agreed to proceed.  Additionally she  has asthma and continues to smoke and was counseled concerning decreasing her tobacco intake prior to surgery.  Lattie Hawichard E Camay Pedigo, MD, FACS

## 2015-12-30 NOTE — Telephone Encounter (Signed)
[phone]  PATIENT STATES THAT THE HYDROCODONE IS NOT STRONG ENOUGH AND WANTS TO KNOW IF THERE IS ANYTHING STRONGER  SHE CAN HAVE.

## 2015-12-30 NOTE — Telephone Encounter (Signed)
Since she has established care with the surgeon (AS OF TODAY), she needs to get from them.

## 2015-12-30 NOTE — Telephone Encounter (Signed)
Routing to the refill box. [facility] gave her pain med. Please see phone note.

## 2015-12-31 ENCOUNTER — Telehealth: Payer: Self-pay | Admitting: Surgery

## 2015-12-31 NOTE — Telephone Encounter (Signed)
Pt advised of pre op date/time and sx date. Sx: 01/16/16 with Dr Ludwig Clarksooper--laparoscopic cholecystectomy. Pre op: 01/09/16 between 9-1pm--Phone.   Patient made aware to call [phone], between 1-3:00pm the day before surgery, to find out what time to arrive.

## 2015-12-31 NOTE — Telephone Encounter (Signed)
Will discuss with Dr. Everlene FarrierPabon in the morning.

## 2015-12-31 NOTE — Telephone Encounter (Signed)
Patient is requesting pain medication. She is scheduled for lap chole with Dr Excell Seltzerooper on 4/28. Her GI doctor had originally prescribed her hydrocodone for abdominal pain that was radiating to her shoulder blade, but the hydrocodone does not help with the pain any longer. She was referred to Dr Excell Seltzerooper from GI doctor after learning the abdominal pain was coming from gallstones and is now scheduled for surgery with Excell Seltzerooper. Please call and advise. I did explain to her that she probably wouldn't be able to get anything today as narcotics cannot be called into the pharmacy, the patient lives in [place] and the prescription would have to be picked up in the office. I explained to her that the nurse has to talk to the doctor first, but that I would have the nurse call her so she can explain to her what kind of pain she is having and then we will go from there. Please call and advise. Thanks.

## 2016-01-01 ENCOUNTER — Telehealth: Payer: Self-pay | Admitting: Internal Medicine

## 2016-01-01 NOTE — Telephone Encounter (Signed)
Patient called again this morning, said she hasn't heard from anyone. I explained that we had clinic running right now, but that the nurse would be giving her a call today.

## 2016-01-01 NOTE — Telephone Encounter (Signed)
Called patient back to let her know that we were not able to write her pain medications at this time since we have not done surgery on her. I did recommend for her to take Ibuprofen 600 MG every 8 hours to help with the pain. I also recommended for her to go to the ED if the pain got worse.

## 2016-01-01 NOTE — Telephone Encounter (Signed)
[phone] PLEASE CALL PATIENT ABOUT HER PAIN MEDICATION.  THE SURGEON TOLD HER THAT SHE COULD TAKE ADVIL BUT WE TOLD HER TO AVOID IT BECAUSE OF HER STOMACH   SHE ALSO STATED THEY WOULD NOT GIVE HER ANYTHING FOR PAIN

## 2016-01-01 NOTE — Telephone Encounter (Signed)
Tried to call pt- NA- LMOM with recommendations.  

## 2016-01-05 MED ORDER — OXYCODONE-ACETAMINOPHEN 5-325 MG PO TABS: 1.0000 | ORAL_TABLET | ORAL | Status: DC | PRN

## 2016-01-05 NOTE — Telephone Encounter (Signed)
I checked the [place] controlled substance reporting registry. The only narcotics received were from me on 12/25/15.  RX written for Percocet. She will need to make this last until her surgery. If symptoms worsen, then go to ER.   Do not use with the Vicodin because of the acetaminophen.

## 2016-01-05 NOTE — Telephone Encounter (Signed)
Karen Shields, pt went to the surgeon is is scheduled for surgery on 01/16/16. She c/o the hydrocodone is not working for her pain. Do you want to write something else for her until her surgery?

## 2016-01-05 NOTE — Addendum Note (Signed)
Addended by: Tiffany KocherLEWIS, LESLIE S on: 01/05/2016 10:52 AM   Modules accepted: Orders

## 2016-01-05 NOTE — Telephone Encounter (Signed)
Tried to call pt- NA- LMOM with detailed instructions about rx and to not take with the hydrocodone or any additional tylenol. Rx is in an envelope at the front desk with instructions as well.

## 2016-01-09 ENCOUNTER — Other Ambulatory Visit: Payer: Medicaid Other

## 2016-01-09 ENCOUNTER — Encounter: Payer: Self-pay | Admitting: *Deleted

## 2016-01-09 NOTE — Patient Instructions (Signed)
  Your procedure is scheduled on: 01-16-16 (FRIDAY) Report to [facility] 2ND FLOOR. To find out your arrival time please call [phone] between 1PM - 3PM on 01-15-16 (THURSDAY)  Remember: Instructions that are not followed completely may result in serious medical risk, up to and including death, or upon the discretion of your surgeon and anesthesiologist your surgery may need to be rescheduled.    _X___ 1. Do not eat food or drink liquids after midnight. No gum chewing or hard candies.     _X___ 2. No Alcohol for 24 hours before or after surgery.   ____ 3. Bring all medications with you on the day of surgery if instructed.    _X___ 4. Notify your doctor if there is any change in your medical condition     (cold, fever, infections).     Do not wear jewelry, make-up, hairpins, clips or nail polish.  Do not wear lotions, powders, or perfumes. You may wear deodorant.  Do not shave 48 hours prior to surgery. Men may shave face and neck.  Do not bring valuables to the hospital.    [facility] is not responsible for any belongings or valuables.               Contacts, dentures or bridgework may not be worn into surgery.  Leave your suitcase in the car. After surgery it may be brought to your room.  For patients admitted to the hospital, discharge time is determined by your treatment team.   Patients discharged the day of surgery will not be allowed to drive home.   Please read over the following fact sheets that you were given:     _X___ Take these medicines the morning of surgery with A SIP OF WATER:    1. LEXAPRO  2. DEXILANT  3. MAY TAKE XANAX IF NEEDED AM OF SURGERY  4.  5.  6.  ____ Fleet Enema (as directed)   _X___ Use CHG Soap as directed  ____ Use inhalers on the day of surgery  ____ Stop metformin 2 days prior to surgery    ____ Take 1/2 of usual insulin dose the night before surgery and none on the morning of surgery.   _X___ Stop  Coumadin/Plavix/aspirin-STOP EXCEDRIN MIGRAINE NOW  ____ Stop Anti-inflammatories-NO NSAIDS OR ASPIRIN PRODUCTS-OXYCODONE OK TO CONTINUE   ____ Stop supplements until after surgery.    ____ Bring C-Pap to the hospital.

## 2016-01-14 ENCOUNTER — Encounter
Admission: RE | Admit: 2016-01-14 | Discharge: 2016-01-14 | Disposition: A | Discharge status: Home / Self Care | Payer: Self-pay | Source: Ambulatory Visit | Attending: Surgery | Admitting: Surgery

## 2016-01-14 DIAGNOSIS — Z01812 Encounter for preprocedural laboratory examination: Secondary | ICD-10-CM | POA: Insufficient documentation

## 2016-01-14 HISTORY — DX: Personal history of Methicillin resistant Staphylococcus aureus infection: Z86.14

## 2016-01-14 LAB — COMPREHENSIVE METABOLIC PANEL
ALBUMIN: 4.2 g/dL (ref 3.5–5.0)
ALK PHOS: 83 U/L (ref 38–126)
ALT: 15 U/L (ref 14–54)
AST: 18 U/L (ref 15–41)
Anion gap: 7 (ref 5–15)
BUN: 7 mg/dL (ref 6–20)
CHLORIDE: 105 mmol/L (ref 101–111)
CO2: 25 mmol/L (ref 22–32)
CREATININE: 0.73 mg/dL (ref 0.44–1.00)
Calcium: 9 mg/dL (ref 8.9–10.3)
GFR calc Af Amer: >60 mL/min (ref >60)
GFR calc non Af Amer: >60 mL/min (ref >60)
Glucose, Bld: 83 mg/dL (ref 65–99)
Potassium: 3.7 mmol/L (ref 3.5–5.1)
SODIUM: 137 mmol/L (ref 135–145)
Total Bilirubin: 0.8 mg/dL (ref 0.3–1.2)
Total Protein: 6.8 g/dL (ref 6.5–8.1)

## 2016-01-14 LAB — CBC WITH DIFFERENTIAL/PLATELET
BASOS ABS: 0 10*3/uL (ref 0–0.1)
Basophils Relative: 0 %
EOS ABS: 0.2 10*3/uL (ref 0–0.7)
Eosinophils Relative: 2 %
HCT: 39.3 % (ref 35.0–47.0)
HEMOGLOBIN: 13.4 g/dL (ref 12.0–16.0)
LYMPHS ABS: 3.6 10*3/uL (ref 1.0–3.6)
Lymphocytes Relative: 36 %
MCH: 30.2 pg (ref 26.0–34.0)
MCHC: 34.2 g/dL (ref 32.0–36.0)
MCV: 88.2 fL (ref 80.0–100.0)
Monocytes Absolute: 0.7 10*3/uL (ref 0.2–0.9)
Monocytes Relative: 6 %
NEUTROS PCT: 56 %
Neutro Abs: 5.6 10*3/uL (ref 1.4–6.5)
Platelets: 300 10*3/uL (ref 150–440)
RBC: 4.45 MIL/uL (ref 3.80–5.20)
RDW: 13.9 % (ref 11.5–14.5)
WBC: 10.1 10*3/uL (ref 3.6–11.0)

## 2016-01-14 LAB — SURGICAL PCR SCREEN
MRSA, PCR: NEGATIVE
STAPHYLOCOCCUS AUREUS: NEGATIVE

## 2016-01-15 ENCOUNTER — Encounter: Payer: Self-pay | Admitting: *Deleted

## 2016-01-16 ENCOUNTER — Encounter: Payer: Self-pay | Admitting: *Deleted

## 2016-01-16 ENCOUNTER — Ambulatory Visit: Payer: Self-pay | Admitting: Anesthesiology

## 2016-01-16 ENCOUNTER — Ambulatory Visit
Admission: RE | Admit: 2016-01-16 | Discharge: 2016-01-16 | Disposition: A | Discharge status: Home / Self Care | Payer: Medicaid Other | Source: Ambulatory Visit | Attending: Surgery | Admitting: Surgery

## 2016-01-16 ENCOUNTER — Encounter
Admission: RE | Disposition: A | Discharge status: Home / Self Care | Payer: Self-pay | Source: Ambulatory Visit | Attending: Surgery

## 2016-01-16 DIAGNOSIS — Z91040 Latex allergy status: Secondary | ICD-10-CM | POA: Insufficient documentation

## 2016-01-16 DIAGNOSIS — Z833 Family history of diabetes mellitus: Secondary | ICD-10-CM | POA: Insufficient documentation

## 2016-01-16 DIAGNOSIS — J45909 Unspecified asthma, uncomplicated: Secondary | ICD-10-CM | POA: Insufficient documentation

## 2016-01-16 DIAGNOSIS — K8020 Calculus of gallbladder without cholecystitis without obstruction: Secondary | ICD-10-CM | POA: Diagnosis not present

## 2016-01-16 DIAGNOSIS — K219 Gastro-esophageal reflux disease without esophagitis: Secondary | ICD-10-CM | POA: Insufficient documentation

## 2016-01-16 DIAGNOSIS — K8010 Calculus of gallbladder with chronic cholecystitis without obstruction: Secondary | ICD-10-CM | POA: Insufficient documentation

## 2016-01-16 DIAGNOSIS — F17210 Nicotine dependence, cigarettes, uncomplicated: Secondary | ICD-10-CM | POA: Insufficient documentation

## 2016-01-16 DIAGNOSIS — F419 Anxiety disorder, unspecified: Secondary | ICD-10-CM | POA: Insufficient documentation

## 2016-01-16 HISTORY — DX: Headache, unspecified: R51.9

## 2016-01-16 HISTORY — PX: CHOLECYSTECTOMY: SHX55

## 2016-01-16 HISTORY — DX: Headache: R51

## 2016-01-16 LAB — POCT PREGNANCY, URINE: Preg Test, Ur: NEGATIVE

## 2016-01-16 SURGERY — LAPAROSCOPIC CHOLECYSTECTOMY WITH INTRAOPERATIVE CHOLANGIOGRAM
Anesthesia: General

## 2016-01-16 MED ORDER — HEPARIN SODIUM (PORCINE) 5000 UNIT/ML IJ SOLN
INTRAMUSCULAR | Status: AC
  Administered 2016-01-16: 5000 [IU] via SUBCUTANEOUS
  Filled 2016-01-16: qty 1

## 2016-01-16 MED ORDER — KETOROLAC TROMETHAMINE 30 MG/ML IJ SOLN
INTRAMUSCULAR | Status: DC | PRN
  Administered 2016-01-16: 30 mg via INTRAVENOUS

## 2016-01-16 MED ORDER — ACETAMINOPHEN 10 MG/ML IV SOLN
INTRAVENOUS | Status: DC | PRN
  Administered 2016-01-16: 1000 mg via INTRAVENOUS

## 2016-01-16 MED ORDER — ACETAMINOPHEN 10 MG/ML IV SOLN
INTRAVENOUS | Status: AC
  Filled 2016-01-16: qty 100

## 2016-01-16 MED ORDER — HYDROMORPHONE HCL 1 MG/ML IJ SOLN
INTRAMUSCULAR | Status: AC
  Administered 2016-01-16: 0.25 mg via INTRAVENOUS
  Filled 2016-01-16: qty 1

## 2016-01-16 MED ORDER — OXYCODONE-ACETAMINOPHEN 5-325 MG PO TABS: 1.0000 | ORAL_TABLET | ORAL | Status: DC | PRN

## 2016-01-16 MED ORDER — HEPARIN SODIUM (PORCINE) 5000 UNIT/ML IJ SOLN
5000.0000 [IU] | Freq: Once | INTRAMUSCULAR | Status: AC
  Administered 2016-01-16: 5000 [IU] via SUBCUTANEOUS

## 2016-01-16 MED ORDER — BUPIVACAINE-EPINEPHRINE (PF) 0.25% -1:200000 IJ SOLN
INTRAMUSCULAR | Status: AC
  Filled 2016-01-16: qty 30

## 2016-01-16 MED ORDER — CEFAZOLIN SODIUM-DEXTROSE 2-4 GM/100ML-% IV SOLN
INTRAVENOUS | Status: AC
  Administered 2016-01-16: 2 g via INTRAVENOUS
  Filled 2016-01-16: qty 100

## 2016-01-16 MED ORDER — PROPOFOL 10 MG/ML IV BOLUS
INTRAVENOUS | Status: DC | PRN
  Administered 2016-01-16: 160 mg via INTRAVENOUS

## 2016-01-16 MED ORDER — LACTATED RINGERS IV SOLN
INTRAVENOUS | Status: DC
  Administered 2016-01-16 (×2): via INTRAVENOUS

## 2016-01-16 MED ORDER — DEXAMETHASONE SODIUM PHOSPHATE 10 MG/ML IJ SOLN
INTRAMUSCULAR | Status: DC | PRN
  Administered 2016-01-16: 8 mg via INTRAVENOUS

## 2016-01-16 MED ORDER — ONDANSETRON HCL 4 MG/2ML IJ SOLN: 4.0000 mg | Freq: Once | INTRAMUSCULAR | Status: DC | PRN

## 2016-01-16 MED ORDER — BUPIVACAINE-EPINEPHRINE (PF) 0.25% -1:200000 IJ SOLN
INTRAMUSCULAR | Status: DC | PRN
  Administered 2016-01-16: 30 mL

## 2016-01-16 MED ORDER — ONDANSETRON HCL 4 MG/2ML IJ SOLN
INTRAMUSCULAR | Status: DC | PRN
  Administered 2016-01-16: 4 mg via INTRAVENOUS

## 2016-01-16 MED ORDER — FENTANYL CITRATE (PF) 100 MCG/2ML IJ SOLN
INTRAMUSCULAR | Status: DC | PRN
  Administered 2016-01-16: 100 ug via INTRAVENOUS
  Administered 2016-01-16 (×2): 50 ug via INTRAVENOUS

## 2016-01-16 MED ORDER — SUGAMMADEX SODIUM 200 MG/2ML IV SOLN
INTRAVENOUS | Status: DC | PRN
  Administered 2016-01-16: 139.8 mg via INTRAVENOUS

## 2016-01-16 MED ORDER — ROCURONIUM BROMIDE 100 MG/10ML IV SOLN
INTRAVENOUS | Status: DC | PRN
  Administered 2016-01-16: 40 mg via INTRAVENOUS

## 2016-01-16 MED ORDER — LIDOCAINE HCL (CARDIAC) 20 MG/ML IV SOLN
INTRAVENOUS | Status: DC | PRN
  Administered 2016-01-16: 40 mg via INTRAVENOUS

## 2016-01-16 MED ORDER — HYDROMORPHONE HCL 1 MG/ML IJ SOLN
0.2500 mg | INTRAMUSCULAR | Status: DC | PRN
  Administered 2016-01-16 (×4): 0.25 mg via INTRAVENOUS

## 2016-01-16 MED ORDER — MIDAZOLAM HCL 2 MG/2ML IJ SOLN
INTRAMUSCULAR | Status: DC | PRN
  Administered 2016-01-16: 2 mg via INTRAVENOUS

## 2016-01-16 MED ORDER — CEFAZOLIN SODIUM-DEXTROSE 2-4 GM/100ML-% IV SOLN
2.0000 g | INTRAVENOUS | Status: AC
  Administered 2016-01-16: 2 g via INTRAVENOUS

## 2016-01-16 SURGICAL SUPPLY — 44 items
ADH LQ OCL WTPRF AMP STRL LF (MISCELLANEOUS) ×1
ADHESIVE MASTISOL STRL (MISCELLANEOUS) ×2 IMPLANT
APPLIER CLIP ROT 10 11.4 M/L (STAPLE) ×2
APR CLP MED LRG 11.4X10 (STAPLE) ×1
BLADE SURG SZ11 CARB STEEL (BLADE) ×2 IMPLANT
CANISTER SUCT 1200ML W/VALVE (MISCELLANEOUS) ×2 IMPLANT
CATH CHOLANGI 4FR 420404F (CATHETERS) IMPLANT
CHLORAPREP W/TINT 26ML (MISCELLANEOUS) ×2 IMPLANT
CLIP APPLIE ROT 10 11.4 M/L (STAPLE) ×1 IMPLANT
CONRAY 60ML FOR OR (MISCELLANEOUS) IMPLANT
DRAPE C-ARM XRAY 36X54 (DRAPES) IMPLANT
ELECT REM PT RETURN 9FT ADLT (ELECTROSURGICAL) ×2
ELECTRODE REM PT RTRN 9FT ADLT (ELECTROSURGICAL) ×1 IMPLANT
ENDOPOUCH RETRIEVER 10 (MISCELLANEOUS) ×2 IMPLANT
GAUZE SPONGE NON-WVN 2X2 STRL (MISCELLANEOUS) ×4 IMPLANT
GLOVE BIO SURGEON STRL SZ8 (GLOVE) ×2 IMPLANT
GOWN STRL REUS W/ TWL LRG LVL3 (GOWN DISPOSABLE) ×4 IMPLANT
GOWN STRL REUS W/TWL LRG LVL3 (GOWN DISPOSABLE) ×8
IRRIGATION STRYKERFLOW (MISCELLANEOUS) IMPLANT
IRRIGATOR STRYKERFLOW (MISCELLANEOUS)
IV CATH ANGIO 12GX3 LT BLUE (NEEDLE) ×2 IMPLANT
IV NS 1000ML (IV SOLUTION)
IV NS 1000ML BAXH (IV SOLUTION) IMPLANT
JACKSON PRATT 10 (INSTRUMENTS) IMPLANT
KIT RM TURNOVER STRD PROC AR (KITS) ×2 IMPLANT
LABEL OR SOLS (LABEL) ×2 IMPLANT
NDL SAFETY 22GX1.5 (NEEDLE) ×2 IMPLANT
NEEDLE VERESS 14GA 120MM (NEEDLE) ×2 IMPLANT
NS IRRIG 500ML POUR BTL (IV SOLUTION) ×2 IMPLANT
PACK LAP CHOLECYSTECTOMY (MISCELLANEOUS) ×2 IMPLANT
SCISSORS METZENBAUM CVD 33 (INSTRUMENTS) ×2 IMPLANT
SLEEVE ENDOPATH XCEL 5M (ENDOMECHANICALS) ×4 IMPLANT
SPONGE EXCIL AMD DRAIN 4X4 6P (MISCELLANEOUS) IMPLANT
SPONGE LAP 18X18 5 PK (GAUZE/BANDAGES/DRESSINGS) ×2 IMPLANT
SPONGE VERSALON 2X2 STRL (MISCELLANEOUS) ×8
STRIP CLOSURE SKIN 1/2X4 (GAUZE/BANDAGES/DRESSINGS) ×2 IMPLANT
SUT MNCRL 4-0 (SUTURE) ×2
SUT MNCRL 4-0 27XMFL (SUTURE) ×1
SUT VICRYL 0 AB UR-6 (SUTURE) ×2 IMPLANT
SUTURE MNCRL 4-0 27XMF (SUTURE) ×1 IMPLANT
SYR 20CC LL (SYRINGE) ×2 IMPLANT
TROCAR XCEL NON-BLD 11X100MML (ENDOMECHANICALS) ×2 IMPLANT
TROCAR XCEL NON-BLD 5MMX100MML (ENDOMECHANICALS) ×2 IMPLANT
TUBING INSUFFLATOR HI FLOW (MISCELLANEOUS) ×2 IMPLANT

## 2016-01-16 NOTE — Op Note (Signed)
Laparoscopic Cholecystectomy  Pre-operative Diagnosis: Biliary colic  Post-operative Diagnosis: Biliary colic  Procedure: Laparoscopic cholecystectomy  Surgeon: Adah Salvageichard E. Excell Seltzerooper, MD FACS  Anesthesia: Gen. with endotracheal tube  Assistant: PA student  Procedure Details  The patient was seen again in the Holding Room. The benefits, complications, treatment options, and expected outcomes were discussed with the patient. The risks of bleeding, infection, recurrence of symptoms, failure to resolve symptoms, bile duct damage, bile duct leak, retained common bile duct stone, bowel injury, any of which could require further surgery and/or ERCP, stent, or papillotomy were reviewed with the patient. The likelihood of improving the patient's symptoms with return to their baseline status is good.  The patient and/or family concurred with the proposed plan, giving informed consent.  The patient was taken to Operating Room, identified as Karen Shields and the procedure verified as Laparoscopic Cholecystectomy.  A Time Out was held and the above information confirmed.  Prior to the induction of general anesthesia, antibiotic prophylaxis was administered. VTE prophylaxis was in place. General endotracheal anesthesia was then administered and tolerated well. After the induction, the abdomen was prepped with Chloraprep and draped in the sterile fashion. The patient was positioned in the supine position.  Local anesthetic  was injected into the skin near the umbilicus and an incision made. The Veress needle was placed. Pneumoperitoneum was then created with CO2 and tolerated well without any adverse changes in the patient's vital signs. A 5mm port was placed in the periumbilical position and the abdominal cavity was explored.  Two 5-mm ports were placed in the right upper quadrant and a 12 mm epigastric port was placed all under direct vision. All skin incisions  were infiltrated with a local anesthetic agent  before making the incision and placing the trocars.   The patient was positioned  in reverse Trendelenburg, tilted slightly to the patient's left.  The gallbladder was identified, the fundus grasped and retracted cephalad. Adhesions were lysed bluntly. The infundibulum was grasped and retracted laterally, exposing the peritoneum overlying the triangle of Calot. This was then divided and exposed in a blunt fashion. A critical view of the cystic duct and cystic artery was obtained.  The cystic duct was clearly identified and bluntly dissected.   The cystic duct was doubly clipped and divided the cystic artery was doubly clipped and divided.  The gallbladder was taken from the gallbladder fossa in a retrograde fashion with the electrocautery. The gallbladder was removed and placed in an Endocatch bag. The liver bed was irrigated and inspected. Hemostasis was achieved with the electrocautery. Copious irrigation was utilized and was repeatedly aspirated until clear.  The gallbladder and Endocatch sac were then removed through the epigastric port site.   Inspection of the right upper quadrant was performed. No bleeding, bile duct injury or leak, or bowel injury was noted. Pneumoperitoneum was released.  The epigastric port site was closed with figure-of-eight 0 Vicryl sutures. 4-0 subcuticular Monocryl was used to close the skin. Steristrips and Mastisol and sterile dressings were  applied.  The patient was then extubated and brought to the recovery room in stable condition. Sponge, lap, and needle counts were correct at closure and at the conclusion of the case.   Findings: Chronic Cholecystitis   Estimated Blood Loss: Minimal         Drains: None         Specimens: Gallbladder           Complications: none  Karen Trombly E. Burt Knack, MD, FACS

## 2016-01-16 NOTE — Anesthesia Procedure Notes (Signed)
Procedure Name: Intubation Date/Time: 01/16/2016 9:09 AM Performed by: Henrietta HooverPOPE, Ellizabeth Dacruz Pre-anesthesia Checklist: Patient identified, Emergency Drugs available, Suction available, Patient being monitored and Timeout performed Patient Re-evaluated:Patient Re-evaluated prior to inductionOxygen Delivery Method: Circle system utilized Preoxygenation: Pre-oxygenation with 100% oxygen Intubation Type: IV induction Ventilation: Mask ventilation without difficulty Laryngoscope Size: Mac and 3 Grade View: Grade II Tube type: Oral Number of attempts: 2 Airway Equipment and Method: Stylet Placement Confirmation: ETT inserted through vocal cords under direct vision,  positive ETCO2 and breath sounds checked- equal and bilateral Secured at: 21 cm Tube secured with: Tape Dental Injury: Teeth and Oropharynx as per pre-operative assessment  Future Recommendations: Recommend- induction with short-acting agent, and alternative techniques readily available

## 2016-01-16 NOTE — Anesthesia Preprocedure Evaluation (Signed)
Anesthesia Evaluation  Patient identified by MRN, date of birth, ID band Patient awake    Reviewed: Allergy & Precautions, NPO status , Patient's Chart, lab work & pertinent test results  Airway Mallampati: I  TM Distance: >3 FB Neck ROM: Full    Dental  (+) Teeth Intact   Pulmonary asthma , Current Smoker,    Pulmonary exam normal        Cardiovascular Exercise Tolerance: Good negative cardio ROS Normal cardiovascular exam     Neuro/Psych    GI/Hepatic GERD  Medicated and Controlled,  Endo/Other    Renal/GU      Musculoskeletal   Abdominal Normal abdominal exam  (+)   Peds  Hematology   Anesthesia Other Findings   Reproductive/Obstetrics                             Anesthesia Physical Anesthesia Plan  ASA: II  Anesthesia Plan: General   Post-op Pain Management:    Induction: Intravenous  Airway Management Planned: Oral ETT  Additional Equipment:   Intra-op Plan:   Post-operative Plan: Extubation in OR  Informed Consent: I have reviewed the patients History and Physical, chart, labs and discussed the procedure including the risks, benefits and alternatives for the proposed anesthesia with the patient or authorized representative who has indicated his/her understanding and acceptance.     Plan Discussed with: CRNA  Anesthesia Plan Comments:         Anesthesia Quick Evaluation

## 2016-01-16 NOTE — Transfer of Care (Signed)
Immediate Anesthesia Transfer of Care Note  Patient: Karen Shields  Procedure(s) Performed: Procedure(s): LAPAROSCOPIC CHOLECYSTECTOMY (N/A)  Patient Location: PACU  Anesthesia Type:General  Level of Consciousness: sedated  Airway & Oxygen Therapy: Patient Spontanous Breathing and Patient connected to face mask oxygen  Post-op Assessment: Report given to RN and Post -op Vital signs reviewed and stable  Post vital signs: Reviewed and stable  Last Vitals: 120/63 71 20 97 @1000   Filed Vitals:   01/16/16 0832  BP: 120/70  Pulse: 91  Temp: 36.5 C  Resp: 16    Last Pain: There were no vitals filed for this visit.       Complications: No apparent anesthesia complications

## 2016-01-16 NOTE — Discharge Instructions (Addendum)
Remove dressing in 24 hours. May shower in 24 hours. Leave paper strips in place. Resume all home medications. Follow-up with Dr. Excell Seltzerooper in 10 days.  AMBULATORY SURGERY DISCHARGE INSTRUCTIONS   1) The drugs that you were given will stay in your system until tomorrow so for the next 24 hours you should not:  A) Drive an automobile B) Make any legal decisions C) Drink any alcoholic beverage  2) You may resume regular meals tomorrow.  Today it is better to start with liquids and gradually work up to solid foods.  You may eat anything you prefer, but it is better to start with liquids, then soup and crackers, and gradually work up to solid foods.   3) Please notify your doctor immediately if you have any unusual bleeding, trouble breathing, redness and pain at the surgery site, drainage, fever, or pain not relieved by medication.  4) Additional Instructions: A STOOL SOFTENER IS RECOMMENDED WHILE TAKING OXYCODONE  Please contact your physician with any problems or Same Day Surgery at [phone], Monday through Friday 6 am to 4 pm, or [place] at [facility] Main number at [phone].

## 2016-01-16 NOTE — Progress Notes (Signed)
Preoperative Review   Patient is met in the preoperative holding area. The history is reviewed in the chart and with the patient. I personally reviewed the options and rationale as well as the risks of this procedure that have been previously discussed with the patient. All questions asked by the patient and/or family were answered to their satisfaction.  Patient agrees to proceed with this procedure at this time.  Richard E Cooper M.D. FACS  

## 2016-01-16 NOTE — Anesthesia Postprocedure Evaluation (Signed)
Anesthesia Post Note  Patient: Karen Shields  Procedure(s) Performed: Procedure(s) (LRB): LAPAROSCOPIC CHOLECYSTECTOMY (N/A)  Patient location during evaluation: PACU Anesthesia Type: General Level of consciousness: awake Pain management: pain level controlled Vital Signs Assessment: post-procedure vital signs reviewed and stable Respiratory status: spontaneous breathing Cardiovascular status: blood pressure returned to baseline Postop Assessment: no headache Anesthetic complications: no    Last Vitals:  Filed Vitals:   01/16/16 1057 01/16/16 1110  BP: 106/57 115/62  Pulse: 59 61  Temp: 36.5 C 36.7 C  Resp: 11 12    Last Pain:  Filed Vitals:   01/16/16 1113  PainSc: Asleep                 Devonne Lalani M

## 2016-01-19 ENCOUNTER — Encounter: Payer: Self-pay | Admitting: Surgery

## 2016-01-19 LAB — SURGICAL PATHOLOGY

## 2016-01-26 ENCOUNTER — Encounter: Payer: Self-pay | Admitting: General Surgery

## 2016-01-27 ENCOUNTER — Encounter: Payer: Self-pay | Admitting: General Surgery

## 2016-01-27 ENCOUNTER — Ambulatory Visit (INDEPENDENT_AMBULATORY_CARE_PROVIDER_SITE_OTHER): Payer: Medicaid Other | Admitting: General Surgery

## 2016-01-27 ENCOUNTER — Encounter (INDEPENDENT_AMBULATORY_CARE_PROVIDER_SITE_OTHER): Payer: Self-pay

## 2016-01-27 VITALS — BP 146/86 | HR 80 | Temp 97.5°F | Ht 63.0 in | Wt 160.5 lb

## 2016-01-27 DIAGNOSIS — Z4889 Encounter for other specified surgical aftercare: Secondary | ICD-10-CM

## 2016-01-27 MED ORDER — OXYCODONE-ACETAMINOPHEN 5-325 MG PO TABS: 1.0000 | ORAL_TABLET | ORAL | Status: DC | PRN

## 2016-01-27 NOTE — Patient Instructions (Signed)
Please call our office if you have any questions or concerns.  

## 2016-01-27 NOTE — Progress Notes (Signed)
Outpatient Surgical Follow Up  01/27/2016  Titus DubinKendall B Mcgilvray is an 25 y.o. female.   No chief complaint on file.   HPI: 25 year old female returns to clinic for follow-up 2 weeks status post laparoscopic cholecystectomy. Patient reports feeling much better than she did before surgery. Does continue to have some intermittent crampy abdominal pain near her umbilical incision site. But she denies any fevers, chills, nausea, vomiting, diarrhea, constipation. She does have a post prandial urgency for a bowel movement. She states this is close to normal as she also has IBS.  Past Medical History  Diagnosis Date  . Eczema 2008  . Ovarian cyst   . Anxiety   . GERD (gastroesophageal reflux disease)   . Gallstones   . Asthma     WELL CONTROLLED  . Asthma   . Headache   . Hx MRSA infection 09-2014    Past Surgical History  Procedure Laterality Date  . Ovarian cyst removal    . Ovarian cyst removal  2011  . Tubal ligation    . Esophagogastroduodenoscopy N/A 11/13/2015    WUJ:[id number]:erosive reflux   . Wisdom tooth extraction    . Cholecystectomy N/A 01/16/2016    Procedure: LAPAROSCOPIC CHOLECYSTECTOMY;  Surgeon: Lattie Hawichard E Cooper, MD;  Location: [facility] ORS;  Service: General;  Laterality: N/A;    Family History  Problem Relation Age of Onset  . Diabetes Mother   . Colon cancer Neg Hx   . Crohn's disease Neg Hx   . Celiac disease Neg Hx     Social History:  reports that she has been smoking Cigarettes.  She started smoking about 9 years ago. She has a 2.5 pack-year smoking history. She has never used smokeless tobacco. She reports that she drinks alcohol. She reports that she does not use illicit drugs.  Allergies:  Allergies  Allergen Reactions  . Latex Dermatitis    Medications reviewed.    ROS A multipoint review of systems was completed. All pertinent positives and negatives are documented in the history of present illness and remainder are negative.   BP 146/86 mmHg  Pulse 80   Temp(Src) 97.5 F (36.4 C) (Oral)  Ht 5\' 3"  (1.6 m)  Wt 72.802 kg (160 lb 8 oz)  BMI 28.44 kg/m2  Physical Exam Gen.: No acute distress Chest: Clear to auscultation Heart: Regular rhythm Abdomen: Soft, nontender, nondistended. Well approximated laparoscopic cholecystectomy incision sites without evidence of erythema, drainage.    No results found for this or any previous visit (from the past 48 hour(s)). No results found.  Assessment/Plan:  1. Aftercare following surgery 25 year old female 2 weeks status post lap scopic cholecystectomy. Provided her with pathology reviewed with her. Discussed appropriate wound care instructions as well as timeframe for returning to normal activities. Also discussed anticipated timeframe for returning to her normal bowel movements. She voiced understanding. We will provide her with a small prescription of pain medications today that should allow her to finish her recovery. She'll follow-up in clinic on an as-needed basis.     Ricarda Frameharles Laurelai Lepp, MD FACS General Surgeon  01/27/2016,10:49 AM

## 2016-02-20 ENCOUNTER — Ambulatory Visit: Payer: Medicaid Other | Admitting: Nurse Practitioner

## 2016-03-05 ENCOUNTER — Encounter: Payer: Self-pay | Admitting: Family Medicine

## 2016-03-10 ENCOUNTER — Other Ambulatory Visit: Payer: Self-pay | Admitting: Nurse Practitioner

## 2016-03-10 ENCOUNTER — Telehealth: Payer: Self-pay | Admitting: Family Medicine

## 2016-03-10 MED ORDER — ALPRAZOLAM 1 MG PO TABS: ORAL_TABLET | ORAL | Status: DC

## 2016-03-10 NOTE — Telephone Encounter (Signed)
Numb 30 one half to one bid prn no further refills from our practice ,(write that on rx)

## 2016-03-10 NOTE — Telephone Encounter (Signed)
Number listed for this patient is incorrect. Script sent to pharmacy.

## 2016-03-10 NOTE — Telephone Encounter (Signed)
This pt came in the other day to discuss her release from the practice and  Was [facility]  She is calling today to see if she can get a refill on her xanax  [facility]

## 2016-03-10 NOTE — Telephone Encounter (Signed)
Last seen 11/21/15 for anxiety

## 2016-03-17 ENCOUNTER — Ambulatory Visit (INDEPENDENT_AMBULATORY_CARE_PROVIDER_SITE_OTHER): Payer: Self-pay | Admitting: Family Medicine

## 2016-03-17 VITALS — BP 112/74 | Temp 98.1°F | Ht 62.0 in | Wt 158.0 lb

## 2016-03-17 DIAGNOSIS — R350 Frequency of micturition: Secondary | ICD-10-CM

## 2016-03-17 DIAGNOSIS — M549 Dorsalgia, unspecified: Secondary | ICD-10-CM

## 2016-03-17 DIAGNOSIS — K219 Gastro-esophageal reflux disease without esophagitis: Secondary | ICD-10-CM

## 2016-03-17 DIAGNOSIS — K529 Noninfective gastroenteritis and colitis, unspecified: Secondary | ICD-10-CM

## 2016-03-17 LAB — POCT URINALYSIS DIPSTICK
PH UA: 6
RBC UA: 250
SPEC GRAV UA: 1.01

## 2016-03-17 MED ORDER — CIPROFLOXACIN HCL 250 MG PO TABS: 250.0000 mg | ORAL_TABLET | Freq: Two times a day (BID) | ORAL | Status: DC

## 2016-03-17 MED ORDER — CHOLESTYRAMINE 4 G PO PACK: PACK | ORAL | Status: DC

## 2016-03-17 MED ORDER — CHLORZOXAZONE 500 MG PO TABS: 500.0000 mg | ORAL_TABLET | Freq: Three times a day (TID) | ORAL | Status: DC | PRN

## 2016-03-17 MED ORDER — NAPROXEN 500 MG PO TABS: 500.0000 mg | ORAL_TABLET | Freq: Two times a day (BID) | ORAL | Status: DC

## 2016-03-17 NOTE — Patient Instructions (Signed)

## 2016-03-17 NOTE — Progress Notes (Signed)
   Subjective:    Patient ID: Karen Shields, female    DOB: 19-May-1991, 25 y.o.   MRN: [id number] Patient arrives office with multiple distinct concerns HPIBack pain started three and a half weeks ago. Worse when bending over. Tried oxycodone without relief. Notes lower lumbar region. Minimal radiation.  Frequent urination. Started 3 -5 days ago. Tried cranberry tablets. Slight dysuria no fever no nausea  Cough. Started 3 days. Patient unfortunately smoking.  Frequent diarrhea. Off and on for awhile. History of probable IBS. Has had a gallbladder removed which also complicates things      Review of Systems Review systems otherwise negative    Objective:   Physical Exam  Alert vital stable HET normal lungs clear heart regular rhythm no CVA tenderness positive low back pain that L patient abdomen soft no discrete  Urinalysis 4-6 white blood cells per high-power field    Assessment & Plan:  Impression 1 subacute low back pain likely musculoskeletal discussed #2 urinary tract infection #3 probable element of IBS exacerbated by diarrhea postcholecystectomy discussed #4 chronic anxiety ongoing plan antibiotics prescribed. Naprosyn when necessary for pain exercise encourage. We'll do a trial of

## 2016-04-13 ENCOUNTER — Encounter (HOSPITAL_COMMUNITY): Payer: Self-pay

## 2016-04-13 ENCOUNTER — Emergency Department (HOSPITAL_COMMUNITY)
Admission: EM | Admit: 2016-04-13 | Discharge: 2016-04-13 | Disposition: A | Discharge status: Home / Self Care | Payer: Medicaid Other | Attending: Emergency Medicine | Admitting: Emergency Medicine

## 2016-04-13 DIAGNOSIS — M545 Low back pain, unspecified: Secondary | ICD-10-CM

## 2016-04-13 DIAGNOSIS — M62830 Muscle spasm of back: Secondary | ICD-10-CM

## 2016-04-13 DIAGNOSIS — F17210 Nicotine dependence, cigarettes, uncomplicated: Secondary | ICD-10-CM | POA: Insufficient documentation

## 2016-04-13 DIAGNOSIS — J45909 Unspecified asthma, uncomplicated: Secondary | ICD-10-CM | POA: Diagnosis not present

## 2016-04-13 MED ORDER — METHOCARBAMOL 500 MG PO TABS: 500.0000 mg | ORAL_TABLET | Freq: Three times a day (TID) | ORAL | 0 refills | Status: DC

## 2016-04-13 MED ORDER — KETOROLAC TROMETHAMINE 10 MG PO TABS
10.0000 mg | ORAL_TABLET | Freq: Once | ORAL | Status: AC
  Administered 2016-04-13: 10 mg via ORAL
  Filled 2016-04-13: qty 1

## 2016-04-13 MED ORDER — DIAZEPAM 5 MG PO TABS
10.0000 mg | ORAL_TABLET | Freq: Once | ORAL | Status: AC
  Administered 2016-04-13: 10 mg via ORAL
  Filled 2016-04-13: qty 2

## 2016-04-13 MED ORDER — DICLOFENAC SODIUM 75 MG PO TBEC: 75.0000 mg | DELAYED_RELEASE_TABLET | Freq: Two times a day (BID) | ORAL | 0 refills | Status: DC

## 2016-04-13 NOTE — ED Provider Notes (Addendum)
[facility] DEPT Provider Note   CSN: [id number] Arrival date & time: 04/13/16  2104  First Provider Contact:  None       History   Chief Complaint Chief Complaint  Patient presents with  . Back Pain    HPI Karen Shields is a 25 y.o. female.  Vision is a 25 year old female who presents to the emergency department with a complaint of back pain.  The patient states that she has a pain that is about her shoulders and between her shoulder blades for about 2-3 weeks. She complains of a lower back pain for 2-3 months. The pain is worse with certain movement of her neck, and when bending to give her children a bath tub. She also notices sometimes pain when she is lifting or carrying an object. She denies any loss of bowel or bladder function. She's not had any frequent falls. His been no recent injury or accidents. She was seen by primary physician in approximately 6 weeks ago, but they only told her that she had some back problem, but did not tell her what it was, nor refer her to a specialist. She has tried rest, but this is only minimally helpful on.      Past Medical History:  Diagnosis Date  . Anxiety   . Asthma    WELL CONTROLLED  . Asthma   . Eczema 2008  . Gallstones   . GERD (gastroesophageal reflux disease)   . Headache   . Hx MRSA infection 09-2014  . Ovarian cyst     Patient Active Problem List   Diagnosis Date Noted  . Gallstone   . RUQ pain 12/25/2015  . Gallstones   . Reflux esophagitis   . Nausea without vomiting 11/04/2015  . Cholelithiases 11/04/2015  . GERD (gastroesophageal reflux disease) 11/04/2015  . Early satiety 11/04/2015  . MRSA (methicillin resistant staph aureus) culture positive 12/11/2014  . Menstrual migraine without status migrainosus, not intractable 07/10/2014  . Anxiety 06/14/2013  . Maternal substance abuse 04/14/2013  . H/O cold sores 04/12/2013  . Ovarian cyst, left 02/09/2011    Past Surgical History:  Procedure  Laterality Date  . CHOLECYSTECTOMY N/A 01/16/2016   Procedure: LAPAROSCOPIC CHOLECYSTECTOMY;  Surgeon: Lattie Haw, MD;  Location: [facility] ORS;  Service: General;  Laterality: N/A;  . ESOPHAGOGASTRODUODENOSCOPY N/A 11/13/2015   WUJ:[id number] reflux   . OVARIAN CYST REMOVAL    . OVARIAN CYST REMOVAL  2011  . TUBAL LIGATION    . WISDOM TOOTH EXTRACTION      OB History    Gravida Para Term Preterm AB Living   2 2 1  0 0 2   SAB TAB Ectopic Multiple Live Births   0 0 0 0         Home Medications    Prior to Admission medications   Medication Sig Start Date End Date Taking? Authorizing Provider  albuterol (PROVENTIL HFA;VENTOLIN HFA) 108 (90 BASE) MCG/ACT inhaler Inhale 2 puffs into the lungs every 6 (six) hours as needed for wheezing. 11/08/14   Campbell Riches, NP  ALPRAZolam Prudy Feeler) 1 MG tablet TAKE 1/2 TO 1 TABLET BY MOUTH TWICE DAILY AS NEEDED FOR ANXIETY. 03/10/16   Merlyn Albert, MD  aspirin-acetaminophen-caffeine (EXCEDRIN MIGRAINE) [phone] MG tablet Take 1 tablet by mouth every 6 (six) hours as needed for headache.    Historical Provider, MD  chlorzoxazone (PARAFON) 500 MG tablet Take 1 tablet (500 mg total) by mouth 3 (three) times daily as needed for  muscle spasms. 03/17/16   Merlyn Albert, MD  cholestyramine Lanetta Inch) 4 g packet Use one pack in glass water up to bid prn diarrhea 03/17/16   Merlyn Albert, MD  ciprofloxacin (CIPRO) 250 MG tablet Take 1 tablet (250 mg total) by mouth 2 (two) times daily. 03/17/16   Merlyn Albert, MD  dexlansoprazole (DEXILANT) 60 MG capsule Take 60 mg by mouth every morning. Reported on 03/17/2016    Historical Provider, MD  escitalopram (LEXAPRO) 20 MG tablet TAKE ONE TABLET BY MOUTH ONCE DAILY. Patient taking differently: TAKE ONE TABLET BY MOUTH ONCE DAILY-AM 12/15/15   Merlyn Albert, MD  naproxen (NAPROSYN) 500 MG tablet Take 1 tablet (500 mg total) by mouth 2 (two) times daily with a meal. 03/17/16   Merlyn Albert, MD    oxyCODONE-acetaminophen (ROXICET) 5-325 MG tablet Take 1 tablet by mouth every 4 (four) hours as needed for moderate pain or severe pain. 01/27/16   Ricarda Frame, MD    Family History Family History  Problem Relation Age of Onset  . Diabetes Mother   . Colon cancer Neg Hx   . Crohn's disease Neg Hx   . Celiac disease Neg Hx     Social History Social History  Substance Use Topics  . Smoking status: Current Every Day Smoker    Packs/day: 1.00    Years: 5.00    Types: Cigarettes    Start date: 02/11/2006  . Smokeless tobacco: Never Used  . Alcohol use 0.0 oz/week     Comment: occassionally     Allergies   Latex   Review of Systems Review of Systems  Musculoskeletal: Positive for back pain.  All other systems reviewed and are negative.    Physical Exam Updated Vital Signs BP 134/61 (BP Location: Left Arm)   Pulse 113   Temp 98.5 F (36.9 C) (Temporal)   Resp 16   Ht 5\' 3"  (1.6 m)   Wt 72.8 kg   LMP 04/13/2016   SpO2 100%   BMI 28.42 kg/m   Physical Exam  Constitutional: She appears well-developed and well-nourished. No distress.  HENT:  Head: Normocephalic and atraumatic.  Right Ear: External ear normal.  Left Ear: External ear normal.  Eyes: Conjunctivae are normal. Right eye exhibits no discharge. Left eye exhibits no discharge. No scleral icterus.  Neck: Neck supple. No tracheal deviation present.  Cardiovascular: Normal rate, regular rhythm and intact distal pulses.   Pulmonary/Chest: Effort normal and breath sounds normal. No stridor. No respiratory distress. She has no wheezes. She has no rales.  Abdominal: Soft. Bowel sounds are normal. She exhibits no distension. There is no tenderness. There is no rebound and no guarding.  Musculoskeletal: She exhibits no edema or tenderness.       Cervical back: She exhibits pain and spasm.       Lumbar back: She exhibits pain.       Back:  Neurological: She is alert. She has normal strength. No cranial nerve  deficit (no facial droop, extraocular movements intact, no slurred speech) or sensory deficit. She exhibits normal muscle tone. She displays no seizure activity. Coordination normal.  Skin: Skin is warm and dry. No rash noted.  Psychiatric: She has a normal mood and affect.  Nursing note and vitals reviewed.    ED Treatments / Results  Labs (all labs ordered are listed, but only abnormal results are displayed) Labs Reviewed - No data to display  EKG  EKG Interpretation None  Radiology No results found.  Procedures Procedures (including critical care time)  Medications Ordered in ED Medications  diazepam (VALIUM) tablet 10 mg (10 mg Oral Given 04/13/16 2210)  ketorolac (TORADOL) tablet 10 mg (10 mg Oral Given 04/13/16 2210)     Initial Impression / Assessment and Plan / ED Course  I have reviewed the triage vital signs and the nursing notes.  Pertinent labs & imaging results that were available during my care of the patient were reviewed by me and considered in my medical decision making (see chart for details).  Clinical Course    **I have reviewed nursing notes, vital signs, and all appropriate lab and imaging results for this patient.*  Final Clinical Impressions(s) / ED Diagnoses  Vital signs reviewed. The examination favors muscle strain of the upper back. And back pain involving the lower back. No palpable step off is appreciated. There is no evidence for caudal quinine, or other emergent back conditions. The patient is advised to rest her back is much as possible. She is advised to use warm tub soaks, or heating pad to the back. She is prescribed Robaxin and diclofenac. She is referred to Dr. Lajoyce Corners for additional evaluation concerning her back issues.    Final diagnoses:  Muscle spasm of back  Midline low back pain without sciatica    New Prescriptions New Prescriptions   No medications on file     Ivery Quale, Cordelia Poche 04/13/16 2228    Zadie Rhine, MD 04/13/16 2308    Ivery Quale, PA-C 04/14/16 1700    Zadie Rhine, MD 04/15/16 1415

## 2016-04-13 NOTE — Discharge Instructions (Signed)
A heating pad or warm tub soaks to the upper and lower back maybe helpful. Please use Robaxin 3 times daily for spasm pain. Please use diclofenac 2 times daily for inflammation and pain. May use Tylenol Extra Strength in between the doses. Please see Dr. Lajoyce Corners for additional evaluation concerning your back.

## 2016-04-13 NOTE — ED Notes (Signed)
Pt states her whole spine has been hurting for the past 2 months. Denies any injury or new activity.

## 2016-04-13 NOTE — ED Triage Notes (Signed)
Generalized back pain X2 months. No injury, no other symptoms.

## 2016-04-13 NOTE — ED Notes (Signed)
Pt alert & oriented x4, stable gait. Patient given discharge instructions, paperwork & prescription(s). Patient  instructed to stop at the registration desk to finish any additional paperwork. Patient verbalized understanding. Pt left department w/ no further questions. 

## 2016-04-16 ENCOUNTER — Other Ambulatory Visit: Payer: Self-pay | Admitting: Family Medicine

## 2016-04-19 NOTE — Telephone Encounter (Signed)
Ask amanda when pt is officially disch fr practice

## 2016-04-20 ENCOUNTER — Encounter (HOSPITAL_COMMUNITY): Payer: Self-pay | Admitting: Emergency Medicine

## 2016-04-20 ENCOUNTER — Emergency Department (HOSPITAL_COMMUNITY): Payer: Medicaid Other

## 2016-04-20 ENCOUNTER — Emergency Department (HOSPITAL_COMMUNITY)
Admission: EM | Admit: 2016-04-20 | Discharge: 2016-04-20 | Disposition: A | Discharge status: Home / Self Care | Payer: Medicaid Other | Attending: Emergency Medicine | Admitting: Emergency Medicine

## 2016-04-20 DIAGNOSIS — J45909 Unspecified asthma, uncomplicated: Secondary | ICD-10-CM | POA: Diagnosis not present

## 2016-04-20 DIAGNOSIS — M549 Dorsalgia, unspecified: Secondary | ICD-10-CM | POA: Insufficient documentation

## 2016-04-20 DIAGNOSIS — R21 Rash and other nonspecific skin eruption: Secondary | ICD-10-CM | POA: Insufficient documentation

## 2016-04-20 DIAGNOSIS — R1032 Left lower quadrant pain: Secondary | ICD-10-CM | POA: Diagnosis not present

## 2016-04-20 DIAGNOSIS — R1031 Right lower quadrant pain: Secondary | ICD-10-CM | POA: Diagnosis not present

## 2016-04-20 DIAGNOSIS — Z7982 Long term (current) use of aspirin: Secondary | ICD-10-CM | POA: Diagnosis not present

## 2016-04-20 DIAGNOSIS — F17210 Nicotine dependence, cigarettes, uncomplicated: Secondary | ICD-10-CM | POA: Insufficient documentation

## 2016-04-20 DIAGNOSIS — Z91040 Latex allergy status: Secondary | ICD-10-CM | POA: Insufficient documentation

## 2016-04-20 DIAGNOSIS — R197 Diarrhea, unspecified: Secondary | ICD-10-CM | POA: Diagnosis present

## 2016-04-20 DIAGNOSIS — Z79899 Other long term (current) drug therapy: Secondary | ICD-10-CM | POA: Diagnosis not present

## 2016-04-20 DIAGNOSIS — R52 Pain, unspecified: Secondary | ICD-10-CM

## 2016-04-20 LAB — URINALYSIS, ROUTINE W REFLEX MICROSCOPIC
Bilirubin Urine: NEGATIVE
Glucose, UA: NEGATIVE mg/dL
HGB URINE DIPSTICK: NEGATIVE
Ketones, ur: NEGATIVE mg/dL
Leukocytes, UA: NEGATIVE
Nitrite: NEGATIVE
PH: 8 (ref 5.0–8.0)
Protein, ur: NEGATIVE mg/dL
SPECIFIC GRAVITY, URINE: 1.015 (ref 1.005–1.030)

## 2016-04-20 LAB — CBC
HEMATOCRIT: 41.5 % (ref 36.0–46.0)
HEMOGLOBIN: 14 g/dL (ref 12.0–15.0)
MCH: 30.2 pg (ref 26.0–34.0)
MCHC: 33.7 g/dL (ref 30.0–36.0)
MCV: 89.6 fL (ref 78.0–100.0)
Platelets: 390 10*3/uL (ref 150–400)
RBC: 4.63 MIL/uL (ref 3.87–5.11)
RDW: 12.6 % (ref 11.5–15.5)
WBC: 13.2 10*3/uL — ABNORMAL HIGH (ref 4.0–10.5)

## 2016-04-20 LAB — COMPREHENSIVE METABOLIC PANEL
ALT: 16 U/L (ref 14–54)
ANION GAP: 8 (ref 5–15)
AST: 18 U/L (ref 15–41)
Albumin: 4.5 g/dL (ref 3.5–5.0)
Alkaline Phosphatase: 90 U/L (ref 38–126)
BUN: 8 mg/dL (ref 6–20)
CHLORIDE: 106 mmol/L (ref 101–111)
CO2: 26 mmol/L (ref 22–32)
CREATININE: 0.64 mg/dL (ref 0.44–1.00)
Calcium: 9.2 mg/dL (ref 8.9–10.3)
GFR calc non Af Amer: >60 mL/min (ref >60)
Glucose, Bld: 78 mg/dL (ref 65–99)
Potassium: 3.4 mmol/L — ABNORMAL LOW (ref 3.5–5.1)
SODIUM: 140 mmol/L (ref 135–145)
Total Bilirubin: 0.4 mg/dL (ref 0.3–1.2)
Total Protein: 7.8 g/dL (ref 6.5–8.1)

## 2016-04-20 LAB — I-STAT BETA HCG BLOOD, ED (MC, WL, AP ONLY): I-stat hCG, quantitative: <5 m[IU]/mL (ref <5)

## 2016-04-20 LAB — LIPASE, BLOOD: LIPASE: 20 U/L (ref 11–51)

## 2016-04-20 MED ORDER — HYDROCODONE-ACETAMINOPHEN 5-325 MG PO TABS: 1.0000 | ORAL_TABLET | Freq: Four times a day (QID) | ORAL | 0 refills | Status: DC | PRN

## 2016-04-20 MED ORDER — ONDANSETRON HCL 4 MG/2ML IJ SOLN
4.0000 mg | Freq: Once | INTRAMUSCULAR | Status: AC
  Administered 2016-04-20: 4 mg via INTRAVENOUS
  Filled 2016-04-20: qty 2

## 2016-04-20 MED ORDER — SODIUM CHLORIDE 0.9 % IV BOLUS (SEPSIS)
2000.0000 mL | Freq: Once | INTRAVENOUS | Status: AC
  Administered 2016-04-20: 2000 mL via INTRAVENOUS

## 2016-04-20 MED ORDER — SULFAMETHOXAZOLE-TRIMETHOPRIM 800-160 MG PO TABS: 1.0000 | ORAL_TABLET | Freq: Two times a day (BID) | ORAL | 0 refills | Status: AC

## 2016-04-20 MED ORDER — VANCOMYCIN HCL IN DEXTROSE 1-5 GM/200ML-% IV SOLN
1000.0000 mg | Freq: Once | INTRAVENOUS | Status: AC
  Administered 2016-04-20: 1000 mg via INTRAVENOUS
  Filled 2016-04-20: qty 200

## 2016-04-20 MED ORDER — KETOROLAC TROMETHAMINE 30 MG/ML IJ SOLN
30.0000 mg | Freq: Once | INTRAMUSCULAR | Status: AC
  Administered 2016-04-20: 30 mg via INTRAVENOUS
  Filled 2016-04-20: qty 1

## 2016-04-20 NOTE — ED Notes (Signed)
From CT 

## 2016-04-20 NOTE — ED Provider Notes (Signed)
[facility] Provider Note   CSN: [id number] Arrival date & time: 04/20/16  1740  First Provider Contact:  First MD Initiated Contact with Patient 04/20/16 1950        History   Chief Complaint Chief Complaint  Patient presents with  . Abscess  . Back Pain  . Diarrhea    HPI ICELA BLACKNALL is a 25 y.o. female.  Patient complains of lower back pain which has been coming chronic problem. She also complains of skin infection to her breast and abdomen   The history is provided by the patient. No language interpreter was used.  Abscess  Abscess location: Both breasts and abdomen. Abscess quality: induration   Abscess quality: not draining   Red streaking: no   Progression:  Unchanged Chronicity:  New Context: not diabetes   Relieved by:  Nothing Worsened by:  Nothing Associated symptoms: no anorexia, no fatigue and no headaches   Back Pain   Pertinent negatives include no chest pain, no headaches and no abdominal pain.  Diarrhea   Pertinent negatives include no abdominal pain, no headaches and no cough.    Past Medical History:  Diagnosis Date  . Anxiety   . Asthma    WELL CONTROLLED  . Asthma   . Eczema 2008  . Gallstones   . GERD (gastroesophageal reflux disease)   . Headache   . Hx MRSA infection 09-2014  . Ovarian cyst     Patient Active Problem List   Diagnosis Date Noted  . Gallstone   . RUQ pain 12/25/2015  . Gallstones   . Reflux esophagitis   . Nausea without vomiting 11/04/2015  . Cholelithiases 11/04/2015  . GERD (gastroesophageal reflux disease) 11/04/2015  . Early satiety 11/04/2015  . MRSA (methicillin resistant staph aureus) culture positive 12/11/2014  . Menstrual migraine without status migrainosus, not intractable 07/10/2014  . Anxiety 06/14/2013  . Maternal substance abuse 04/14/2013  . H/O cold sores 04/12/2013  . Ovarian cyst, left 02/09/2011    Past Surgical History:  Procedure Laterality Date  . CHOLECYSTECTOMY N/A  01/16/2016   Procedure: LAPAROSCOPIC CHOLECYSTECTOMY;  Surgeon: Lattie Haw, MD;  Location: [facility] ORS;  Service: General;  Laterality: N/A;  . ESOPHAGOGASTRODUODENOSCOPY N/A 11/13/2015   PVX:[id number] reflux   . OVARIAN CYST REMOVAL    . OVARIAN CYST REMOVAL  2011  . TUBAL LIGATION    . WISDOM TOOTH EXTRACTION      OB History    Gravida Para Term Preterm AB Living   2 2 1  0 0 2   SAB TAB Ectopic Multiple Live Births   0 0 0 0 1       Home Medications    Prior to Admission medications   Medication Sig Start Date End Date Taking? Authorizing Provider  albuterol (PROVENTIL HFA;VENTOLIN HFA) 108 (90 BASE) MCG/ACT inhaler Inhale 2 puffs into the lungs every 6 (six) hours as needed for wheezing. 11/08/14   Campbell Riches, NP  ALPRAZolam Prudy Feeler) 1 MG tablet TAKE 1/2 TO 1 TABLET BY MOUTH TWICE DAILY AS NEEDED FOR ANXIETY. 03/10/16   Merlyn Albert, MD  aspirin-acetaminophen-caffeine (EXCEDRIN MIGRAINE) [phone] MG tablet Take 1 tablet by mouth every 6 (six) hours as needed for headache.    Historical Provider, MD  chlorzoxazone (PARAFON) 500 MG tablet Take 1 tablet (500 mg total) by mouth 3 (three) times daily as needed for muscle spasms. 03/17/16   Merlyn Albert, MD  cholestyramine Lanetta Inch) 4 g packet Use one pack  in glass water up to bid prn diarrhea 03/17/16   Merlyn Albert, MD  ciprofloxacin (CIPRO) 250 MG tablet Take 1 tablet (250 mg total) by mouth 2 (two) times daily. 03/17/16   Merlyn Albert, MD  dexlansoprazole (DEXILANT) 60 MG capsule Take 60 mg by mouth every morning. Reported on 03/17/2016    Historical Provider, MD  diclofenac (VOLTAREN) 75 MG EC tablet Take 1 tablet (75 mg total) by mouth 2 (two) times daily. 04/13/16   Ivery Quale, PA-C  escitalopram (LEXAPRO) 20 MG tablet TAKE ONE TABLET BY MOUTH ONCE DAILY. Patient taking differently: TAKE ONE TABLET BY MOUTH ONCE DAILY-AM 12/15/15   Merlyn Albert, MD  HYDROcodone-acetaminophen (NORCO/VICODIN) 5-325 MG tablet  Take 1 tablet by mouth every 6 (six) hours as needed. 04/20/16   Bethann Berkshire, MD  methocarbamol (ROBAXIN) 500 MG tablet Take 1 tablet (500 mg total) by mouth 3 (three) times daily. 04/13/16   Ivery Quale, PA-C  naproxen (NAPROSYN) 500 MG tablet Take 1 tablet (500 mg total) by mouth 2 (two) times daily with a meal. 03/17/16   Merlyn Albert, MD  oxyCODONE-acetaminophen (ROXICET) 5-325 MG tablet Take 1 tablet by mouth every 4 (four) hours as needed for moderate pain or severe pain. 01/27/16   Ricarda Frame, MD  sulfamethoxazole-trimethoprim (BACTRIM DS,SEPTRA DS) 800-160 MG tablet Take 1 tablet by mouth 2 (two) times daily. 04/20/16 04/27/16  Bethann Berkshire, MD    Family History Family History  Problem Relation Age of Onset  . Diabetes Mother   . Colon cancer Neg Hx   . Crohn's disease Neg Hx   . Celiac disease Neg Hx     Social History Social History  Substance Use Topics  . Smoking status: Current Every Day Smoker    Packs/day: 1.00    Years: 5.00    Types: Cigarettes    Start date: 02/11/2006  . Smokeless tobacco: Never Used  . Alcohol use 0.0 oz/week     Comment: occassionally     Allergies   Latex   Review of Systems Review of Systems  Constitutional: Negative for appetite change and fatigue.  HENT: Negative for congestion, ear discharge and sinus pressure.   Eyes: Negative for discharge.  Respiratory: Negative for cough.   Cardiovascular: Negative for chest pain.  Gastrointestinal: Positive for diarrhea. Negative for abdominal pain and anorexia.  Genitourinary: Negative for frequency and hematuria.  Musculoskeletal: Positive for back pain.  Skin: Positive for rash.  Neurological: Negative for seizures and headaches.  Psychiatric/Behavioral: Negative for hallucinations.     Physical Exam Updated Vital Signs BP 124/70 (BP Location: Left Arm)   Pulse 76   Temp 98.1 F (36.7 C) (Oral)   Resp 17   Ht 5\' 3"  (1.6 m)   Wt 155 lb (70.3 kg)   LMP 04/13/2016   SpO2  100%   BMI 27.46 kg/m   Physical Exam  Constitutional: She is oriented to person, place, and time. She appears well-developed.  HENT:  Head: Normocephalic.  Eyes: Conjunctivae and EOM are normal. No scleral icterus.  Neck: Neck supple. No thyromegaly present.  Cardiovascular: Normal rate and regular rhythm.  Exam reveals no gallop and no friction rub.   No murmur heard. Pulmonary/Chest: No stridor. She has no wheezes. She has no rales. She exhibits no tenderness.  Abdominal: She exhibits no distension. There is no tenderness. There is no rebound.  Musculoskeletal: Normal range of motion. She exhibits no edema.  Lymphadenopathy:    She has  no cervical adenopathy.  Neurological: She is oriented to person, place, and time. She exhibits normal muscle tone. Coordination normal.  Skin: Rash noted. No erythema.  Patient has 3 small infections one in each breast and one on her abdomen. One of the left breast with the largest  Psychiatric: She has a normal mood and affect. Her behavior is normal.     ED Treatments / Results  Labs (all labs ordered are listed, but only abnormal results are displayed) Labs Reviewed  COMPREHENSIVE METABOLIC PANEL - Abnormal; Notable for the following:       Result Value   Potassium 3.4 (*)    All other components within normal limits  CBC - Abnormal; Notable for the following:    WBC 13.2 (*)    All other components within normal limits  LIPASE, BLOOD  URINALYSIS, ROUTINE W REFLEX MICROSCOPIC (NOT AT [facility])  I-STAT BETA HCG BLOOD, ED ([facility], [facility], [facility] ONLY)    EKG  EKG Interpretation None       Radiology No results found.  Procedures Procedures (including critical care time)  Medications Ordered in ED Medications  vancomycin (VANCOCIN) IVPB 1000 mg/200 mL premix (1,000 mg Intravenous New Bag/Given 04/20/16 2105)  sodium chloride 0.9 % bolus 2,000 mL (2,000 mLs Intravenous New Bag/Given 04/20/16 2104)  ketorolac (TORADOL) 30 MG/ML injection 30 mg (30  mg Intravenous Given 04/20/16 2104)  ondansetron (ZOFRAN) injection 4 mg (4 mg Intravenous Given 04/20/16 2105)     Initial Impression / Assessment and Plan / ED Course  I have reviewed the triage vital signs and the nursing notes.  Pertinent labs & imaging results that were available during my care of the patient were reviewed by me and considered in my medical decision making (see chart for details).  Clinical Course    Skin infection with most likely MRSA patient will be put on Bactrim and will follow-up in 2-3 days. Patient with chronic back pain labs unremarkable patient had a CT scan pending  Final Clinical Impressions(s) / ED Diagnoses   Final diagnoses:  Pain    New Prescriptions New Prescriptions   HYDROCODONE-ACETAMINOPHEN (NORCO/VICODIN) 5-325 MG TABLET    Take 1 tablet by mouth every 6 (six) hours as needed.   SULFAMETHOXAZOLE-TRIMETHOPRIM (BACTRIM DS,SEPTRA DS) 800-160 MG TABLET    Take 1 tablet by mouth 2 (two) times daily.     Bethann Berkshire, MD 04/20/16 2156

## 2016-04-20 NOTE — ED Notes (Signed)
Dr Estell Harpin in to reassess and to discuss care

## 2016-04-20 NOTE — Discharge Instructions (Signed)
Follow-up in 2-3 days for recheck of infection and skin.

## 2016-04-20 NOTE — ED Triage Notes (Addendum)
Pt reports 2 abscesses on left breast, one abscess on posterior left thigh since last Thursday.  Pt has not had any drainage from areas.  Pt also reports generalized back pain, left flank pain.  Pt had diarrhea over the weekend. Pt also reports h/a since Saturday. Pt also has abdominal pain in RLQ and LLQ.

## 2016-05-30 ENCOUNTER — Encounter (HOSPITAL_COMMUNITY): Payer: Self-pay | Admitting: Emergency Medicine

## 2016-05-30 ENCOUNTER — Emergency Department (HOSPITAL_COMMUNITY)
Admission: EM | Admit: 2016-05-30 | Discharge: 2016-05-31 | Disposition: A | Discharge status: Home / Self Care | Payer: Medicaid Other | Attending: Emergency Medicine | Admitting: Emergency Medicine

## 2016-05-30 DIAGNOSIS — Z79899 Other long term (current) drug therapy: Secondary | ICD-10-CM | POA: Diagnosis not present

## 2016-05-30 DIAGNOSIS — R197 Diarrhea, unspecified: Secondary | ICD-10-CM | POA: Insufficient documentation

## 2016-05-30 DIAGNOSIS — J45909 Unspecified asthma, uncomplicated: Secondary | ICD-10-CM | POA: Insufficient documentation

## 2016-05-30 DIAGNOSIS — F17210 Nicotine dependence, cigarettes, uncomplicated: Secondary | ICD-10-CM | POA: Diagnosis not present

## 2016-05-30 DIAGNOSIS — Z7982 Long term (current) use of aspirin: Secondary | ICD-10-CM | POA: Insufficient documentation

## 2016-05-30 DIAGNOSIS — M545 Low back pain: Secondary | ICD-10-CM | POA: Diagnosis present

## 2016-05-30 DIAGNOSIS — N760 Acute vaginitis: Secondary | ICD-10-CM | POA: Insufficient documentation

## 2016-05-30 DIAGNOSIS — B9689 Other specified bacterial agents as the cause of diseases classified elsewhere: Secondary | ICD-10-CM

## 2016-05-30 LAB — URINALYSIS, ROUTINE W REFLEX MICROSCOPIC
Bilirubin Urine: NEGATIVE
Glucose, UA: NEGATIVE mg/dL
Hgb urine dipstick: NEGATIVE
LEUKOCYTES UA: NEGATIVE
Nitrite: NEGATIVE
Protein, ur: NEGATIVE mg/dL
pH: 6 (ref 5.0–8.0)

## 2016-05-30 LAB — COMPREHENSIVE METABOLIC PANEL
ALK PHOS: 82 U/L (ref 38–126)
ALT: 18 U/L (ref 14–54)
AST: 18 U/L (ref 15–41)
Albumin: 4.9 g/dL (ref 3.5–5.0)
Anion gap: 9 (ref 5–15)
BUN: 7 mg/dL (ref 6–20)
CALCIUM: 9.6 mg/dL (ref 8.9–10.3)
CO2: 25 mmol/L (ref 22–32)
CREATININE: 0.68 mg/dL (ref 0.44–1.00)
Chloride: 104 mmol/L (ref 101–111)
GFR calc non Af Amer: >60 mL/min (ref >60)
GLUCOSE: 105 mg/dL — AB (ref 65–99)
Potassium: 3.7 mmol/L (ref 3.5–5.1)
SODIUM: 138 mmol/L (ref 135–145)
Total Bilirubin: 0.5 mg/dL (ref 0.3–1.2)
Total Protein: 7.9 g/dL (ref 6.5–8.1)

## 2016-05-30 LAB — CBC WITH DIFFERENTIAL/PLATELET
Basophils Absolute: 0 10*3/uL (ref 0.0–0.1)
Basophils Relative: 0 %
EOS ABS: 0.3 10*3/uL (ref 0.0–0.7)
Eosinophils Relative: 2 %
HCT: 40.2 % (ref 36.0–46.0)
HEMOGLOBIN: 13.7 g/dL (ref 12.0–15.0)
LYMPHS ABS: 3.4 10*3/uL (ref 0.7–4.0)
LYMPHS PCT: 25 %
MCH: 30.1 pg (ref 26.0–34.0)
MCHC: 34.1 g/dL (ref 30.0–36.0)
MCV: 88.4 fL (ref 78.0–100.0)
Monocytes Absolute: 0.7 10*3/uL (ref 0.1–1.0)
Monocytes Relative: 5 %
NEUTROS ABS: 8.9 10*3/uL — AB (ref 1.7–7.7)
NEUTROS PCT: 68 %
Platelets: 366 10*3/uL (ref 150–400)
RBC: 4.55 MIL/uL (ref 3.87–5.11)
RDW: 12.6 % (ref 11.5–15.5)
WBC: 13.2 10*3/uL — AB (ref 4.0–10.5)

## 2016-05-30 LAB — PREGNANCY, URINE: Preg Test, Ur: NEGATIVE

## 2016-05-30 NOTE — ED Triage Notes (Signed)
Patient c/o mid back pain/bilateral back pain. Patient denies dysuria but reports cloudy urine with foul odor. Patient reports taking AZO with no relief.   Per patient chills, nausea, and vomiting. Patient states she did have some diarrhea but now states she feels like she is bloated and constipated. Patient also c/o eczema on left breast in which she has used neosporin and dressing with no improvement. Patient stated that she had eczema on left breast during last pregnancy.

## 2016-05-31 LAB — WET PREP, GENITAL
SPERM: NONE SEEN
Trich, Wet Prep: NONE SEEN
Yeast Wet Prep HPF POC: NONE SEEN

## 2016-05-31 MED ORDER — METRONIDAZOLE 500 MG PO TABS: 500.0000 mg | ORAL_TABLET | Freq: Two times a day (BID) | ORAL | 0 refills | Status: DC

## 2016-05-31 MED ORDER — METRONIDAZOLE 500 MG PO TABS
500.0000 mg | ORAL_TABLET | Freq: Once | ORAL | Status: AC
  Administered 2016-05-31: 500 mg via ORAL
  Filled 2016-05-31: qty 1

## 2016-05-31 NOTE — ED Notes (Signed)
EDPA in with this pt at this time

## 2016-06-01 LAB — GC/CHLAMYDIA PROBE AMP (~~LOC~~) NOT AT ARMC
Chlamydia: NEGATIVE
Neisseria Gonorrhea: NEGATIVE

## 2016-06-01 NOTE — ED Provider Notes (Signed)
[facility]-EM[facility]ENCY DEPT Provider Note   CSN: [id number] Arrival date & time: 05/30/16  1811     History   Chief Complaint Chief Complaint  Patient presents with  . Back Pain  . Urinary Tract Infection    HPI Karen Shields is a 25 y.o. female presenting with a nearly one week history of bilateral low back pain along with cloudy foul smelling odor but denies dysuria, increased frequency or urgency.  She has had some chills and suspected fever this week in association with diarrhea which has improved, but now feels bloated and constipated.  She took no medicines for the diarrhea.  She has taken azo with no relief of symptoms.  She has had no vaginal discharge, pelvic or abdominal pain.  She does endorse increased stress which triggers flare of eczema, currently with patchy rash on her wrists, hand and left breast.  She has applied a steroid cream x 1 to these sites.  The history is provided by the patient and a friend.    Past Medical History:  Diagnosis Date  . Anxiety   . Asthma    WELL CONTROLLED  . Asthma   . Eczema 2008  . Gallstones   . GERD (gastroesophageal reflux disease)   . Headache   . Hx MRSA infection 09-2014  . Ovarian cyst     Patient Active Problem List   Diagnosis Date Noted  . Gallstone   . RUQ pain 12/25/2015  . Gallstones   . Reflux esophagitis   . Nausea without vomiting 11/04/2015  . Cholelithiases 11/04/2015  . GERD (gastroesophageal reflux disease) 11/04/2015  . Early satiety 11/04/2015  . MRSA (methicillin resistant staph aureus) culture positive 12/11/2014  . Menstrual migraine without status migrainosus, not intractable 07/10/2014  . Anxiety 06/14/2013  . Maternal substance abuse 04/14/2013  . H/O cold sores 04/12/2013  . Ovarian cyst, left 02/09/2011    Past Surgical History:  Procedure Laterality Date  . CHOLECYSTECTOMY N/A 01/16/2016   Procedure: LAPAROSCOPIC CHOLECYSTECTOMY;  Surgeon: Lattie Haw, MD;  Location: [facility] ORS;   Service: General;  Laterality: N/A;  . ESOPHAGOGASTRODUODENOSCOPY N/A 11/13/2015   WUJ:WJXBJYN reflux   . OVARIAN CYST REMOVAL    . OVARIAN CYST REMOVAL  2011  . TUBAL LIGATION    . WISDOM TOOTH EXTRACTION      OB History    Gravida Para Term Preterm AB Living   2 2 1  0 0 2   SAB TAB Ectopic Multiple Live Births   0 0 0 0 1       Home Medications    Prior to Admission medications   Medication Sig Start Date End Date Taking? Authorizing Provider  albuterol (PROVENTIL HFA;VENTOLIN HFA) 108 (90 BASE) MCG/ACT inhaler Inhale 2 puffs into the lungs every 6 (six) hours as needed for wheezing. 11/08/14   Campbell Riches, NP  ALPRAZolam Prudy Feeler) 1 MG tablet TAKE 1/2 TO 1 TABLET BY MOUTH TWICE DAILY AS NEEDED FOR ANXIETY. 03/10/16   Merlyn Albert, MD  aspirin-acetaminophen-caffeine (EXCEDRIN MIGRAINE) [phone] MG tablet Take 1 tablet by mouth every 6 (six) hours as needed for headache.    Historical Provider, MD  chlorzoxazone (PARAFON) 500 MG tablet Take 1 tablet (500 mg total) by mouth 3 (three) times daily as needed for muscle spasms. 03/17/16   Merlyn Albert, MD  cholestyramine Lanetta Inch) 4 g packet Use one pack in glass water up to bid prn diarrhea Patient taking differently: Take 4 g by mouth 2 (two)  times daily. Use one pack in glass water up to bid prn diarrhea 03/17/16   Merlyn Albert, MD  ciprofloxacin (CIPRO) 250 MG tablet Take 1 tablet (250 mg total) by mouth 2 (two) times daily. Patient not taking: Reported on 04/20/2016 03/17/16   Merlyn Albert, MD  dexlansoprazole (DEXILANT) 60 MG capsule Take 60 mg by mouth every morning. Reported on 03/17/2016    Historical Provider, MD  diclofenac (VOLTAREN) 75 MG EC tablet Take 1 tablet (75 mg total) by mouth 2 (two) times daily. 04/13/16   Ivery Quale, PA-C  escitalopram (LEXAPRO) 20 MG tablet TAKE ONE TABLET BY MOUTH ONCE DAILY. Patient taking differently: TAKE ONE TABLET BY MOUTH ONCE DAILY-AM 12/15/15   Merlyn Albert, MD    HYDROcodone-acetaminophen (NORCO/VICODIN) 5-325 MG tablet Take 1 tablet by mouth every 6 (six) hours as needed. 04/20/16   Bethann Berkshire, MD  methocarbamol (ROBAXIN) 500 MG tablet Take 1 tablet (500 mg total) by mouth 3 (three) times daily. 04/13/16   Ivery Quale, PA-C  metroNIDAZOLE (FLAGYL) 500 MG tablet Take 1 tablet (500 mg total) by mouth 2 (two) times daily. 05/31/16   Burgess Amor, PA-C  naproxen (NAPROSYN) 500 MG tablet Take 1 tablet (500 mg total) by mouth 2 (two) times daily with a meal. Patient not taking: Reported on 04/20/2016 03/17/16   Merlyn Albert, MD  oxyCODONE-acetaminophen (ROXICET) 5-325 MG tablet Take 1 tablet by mouth every 4 (four) hours as needed for moderate pain or severe pain. 01/27/16   Ricarda Frame, MD    Family History Family History  Problem Relation Age of Onset  . Diabetes Mother   . Colon cancer Neg Hx   . Crohn's disease Neg Hx   . Celiac disease Neg Hx     Social History Social History  Substance Use Topics  . Smoking status: Current Every Day Smoker    Packs/day: 1.00    Years: 5.00    Types: Cigarettes    Start date: 02/11/2006  . Smokeless tobacco: Never Used  . Alcohol use 0.0 oz/week     Comment: occassionally     Allergies   Latex   Review of Systems Review of Systems  Constitutional: Positive for chills.  HENT: Negative for congestion and sore throat.   Eyes: Negative.   Respiratory: Negative for chest tightness and shortness of breath.   Cardiovascular: Negative for chest pain.  Gastrointestinal: Positive for diarrhea. Negative for abdominal pain, nausea and vomiting.  Genitourinary: Negative.  Negative for dysuria, flank pain, frequency, hematuria, urgency, vaginal discharge and vaginal pain.  Musculoskeletal: Negative for arthralgias, joint swelling and neck pain.  Skin: Negative.  Negative for rash and wound.  Neurological: Negative for dizziness, weakness, light-headedness, numbness and headaches.  Psychiatric/Behavioral:  Negative.      Physical Exam Updated Vital Signs BP 150/99   Pulse 77   Temp 98.3 F (36.8 C) (Oral)   Resp 18   Ht 5\' 3"  (1.6 m)   Wt 72.6 kg   LMP 05/06/2016   SpO2 99%   BMI 28.34 kg/m   Physical Exam  Constitutional: She appears well-developed and well-nourished.  HENT:  Head: Normocephalic and atraumatic.  Eyes: Conjunctivae are normal.  Neck: Normal range of motion.  Cardiovascular: Normal rate, regular rhythm, normal heart sounds and intact distal pulses.   Pulmonary/Chest: Effort normal and breath sounds normal. She has no wheezes.  Abdominal: Soft. Bowel sounds are normal. She exhibits no mass. There is no tenderness. There is no  guarding.  Genitourinary: Uterus normal. Cervix exhibits no motion tenderness, no discharge and no friability. Right adnexum displays no mass, no tenderness and no fullness. Left adnexum displays no mass, no tenderness and no fullness. Vaginal discharge found.  Genitourinary Comments: Scant white frothy dc.  Musculoskeletal: Normal range of motion.  Neurological: She is alert.  Skin: Skin is warm and dry. Rash noted. Rash is macular.  Macular patches of dry appearing hyperkeratotic skin posterior hands, wrist, left breast.  No pustules, drainage, no satellite lesions.   Psychiatric: She has a normal mood and affect.  Nursing note and vitals reviewed.    ED Treatments / Results  Labs (all labs ordered are listed, but only abnormal results are displayed) Labs Reviewed  WET PREP, GENITAL - Abnormal; Notable for the following:       Result Value   Clue Cells Wet Prep HPF POC PRESENT (*)    WBC, Wet Prep HPF POC MANY (*)    All other components within normal limits  URINALYSIS, ROUTINE W REFLEX MICROSCOPIC (NOT AT [facility]) - Abnormal; Notable for the following:    APPearance HAZY (*)    Specific Gravity, Urine <1.005 (*)    Ketones, ur TRACE (*)    All other components within normal limits  CBC WITH DIFFERENTIAL/PLATELET - Abnormal;  Notable for the following:    WBC 13.2 (*)    Neutro Abs 8.9 (*)    All other components within normal limits  COMPREHENSIVE METABOLIC PANEL - Abnormal; Notable for the following:    Glucose, Bld 105 (*)    All other components within normal limits  URINE CULTURE  PREGNANCY, URINE  GC/CHLAMYDIA PROBE AMP ([place]) NOT AT [facility]    EKG  EKG Interpretation None       Radiology No results found.  Procedures Procedures (including critical care time)  Medications Ordered in ED Medications  metroNIDAZOLE (FLAGYL) tablet 500 mg (500 mg Oral Given 05/31/16 0146)     Initial Impression / Assessment and Plan / ED Course  I have reviewed the triage vital signs and the nursing notes.  Pertinent labs & imaging results that were available during my care of the patient were reviewed by me and considered in my medical decision making (see chart for details).  Clinical Course    Labs reviewed, gc/chlamydia pending.  Flagyl prescribed. Advised f/uw for persistent sx.  Referrals given for pcp.  Advised may continue steroid cream (otc) sparingly qd to eczema for up to 10 days.  Final Clinical Impressions(s) / ED Diagnoses   Final diagnoses:  Bacterial vaginosis    New Prescriptions Discharge Medication List as of 05/31/2016  1:37 AM    START taking these medications   Details  metroNIDAZOLE (FLAGYL) 500 MG tablet Take 1 tablet (500 mg total) by mouth 2 (two) times daily., Starting Mon 05/31/2016, Print         Burgess AmorJulie Samanth Mirkin, PA-C 06/01/16 1428    Vanetta MuldersScott Zackowski, MD 06/02/16 [phone]

## 2016-06-02 LAB — URINE CULTURE

## 2016-06-03 ENCOUNTER — Telehealth (HOSPITAL_COMMUNITY): Payer: Self-pay

## 2016-06-03 NOTE — Telephone Encounter (Signed)
Post ED Visit - Positive Culture Follow-up  Culture report reviewed by antimicrobial stewardship pharmacist:  []  Enzo BiNathan Batchelder, Pharm.D. []  Celedonio MiyamotoJeremy Frens, Pharm.D., BCPS []  Garvin FilaMike Maccia, Pharm.D. []  Georgina PillionElizabeth Martin, Pharm.D., BCPS []  HometownMinh Pham, [address] Rainbow BoulevardPharm.D., BCPS, AAHIVP []  Estella HuskMichelle Turner, Pharm.D., BCPS, AAHIVP []  Tennis Mustassie Stewart, Pharm.D. []  Rob Pleasant PlainVincent, [place]arm[place]D. Gertie GowdaX  Emily Stewart, Pharm.D.  Positive urine culture, >/= 100,000 colonies -> E Coli Treated with Metronidazole Chart reviewed by Harolyn RutherfordShawn Joy PA "No Treatment"  Arvid RightClark, Kaveri Perras Dorn 06/03/2016, 4:51 PM

## 2016-09-08 ENCOUNTER — Encounter (HOSPITAL_COMMUNITY): Payer: Self-pay | Admitting: Emergency Medicine

## 2016-09-08 ENCOUNTER — Emergency Department (HOSPITAL_COMMUNITY)
Admission: EM | Admit: 2016-09-08 | Discharge: 2016-09-08 | Disposition: A | Discharge status: Home / Self Care | Payer: Medicaid Other | Attending: Emergency Medicine | Admitting: Emergency Medicine

## 2016-09-08 DIAGNOSIS — F17210 Nicotine dependence, cigarettes, uncomplicated: Secondary | ICD-10-CM | POA: Insufficient documentation

## 2016-09-08 DIAGNOSIS — N760 Acute vaginitis: Secondary | ICD-10-CM | POA: Diagnosis not present

## 2016-09-08 DIAGNOSIS — Z79899 Other long term (current) drug therapy: Secondary | ICD-10-CM | POA: Diagnosis not present

## 2016-09-08 DIAGNOSIS — Z7982 Long term (current) use of aspirin: Secondary | ICD-10-CM | POA: Diagnosis not present

## 2016-09-08 DIAGNOSIS — Z791 Long term (current) use of non-steroidal anti-inflammatories (NSAID): Secondary | ICD-10-CM | POA: Insufficient documentation

## 2016-09-08 DIAGNOSIS — B9689 Other specified bacterial agents as the cause of diseases classified elsewhere: Secondary | ICD-10-CM

## 2016-09-08 DIAGNOSIS — J45909 Unspecified asthma, uncomplicated: Secondary | ICD-10-CM | POA: Diagnosis not present

## 2016-09-08 DIAGNOSIS — R51 Headache: Secondary | ICD-10-CM | POA: Insufficient documentation

## 2016-09-08 DIAGNOSIS — N898 Other specified noninflammatory disorders of vagina: Secondary | ICD-10-CM | POA: Diagnosis present

## 2016-09-08 LAB — URINALYSIS, ROUTINE W REFLEX MICROSCOPIC
Bilirubin Urine: NEGATIVE
GLUCOSE, UA: NEGATIVE mg/dL
Hgb urine dipstick: NEGATIVE
KETONES UR: NEGATIVE mg/dL
LEUKOCYTES UA: NEGATIVE
NITRITE: NEGATIVE
PH: 6 (ref 5.0–8.0)
PROTEIN: NEGATIVE mg/dL
Specific Gravity, Urine: 1.015 (ref 1.005–1.030)

## 2016-09-08 LAB — WET PREP, GENITAL
Sperm: NONE SEEN
TRICH WET PREP: NONE SEEN
Yeast Wet Prep HPF POC: NONE SEEN

## 2016-09-08 LAB — CBC
HEMATOCRIT: 43.1 % (ref 36.0–46.0)
Hemoglobin: 14.3 g/dL (ref 12.0–15.0)
MCH: 29.5 pg (ref 26.0–34.0)
MCHC: 33.2 g/dL (ref 30.0–36.0)
MCV: 88.9 fL (ref 78.0–100.0)
PLATELETS: 410 10*3/uL — AB (ref 150–400)
RBC: 4.85 MIL/uL (ref 3.87–5.11)
RDW: 12.7 % (ref 11.5–15.5)
WBC: 11.3 10*3/uL — AB (ref 4.0–10.5)

## 2016-09-08 LAB — COMPREHENSIVE METABOLIC PANEL
ALT: 28 U/L (ref 14–54)
AST: 29 U/L (ref 15–41)
Albumin: 4.4 g/dL (ref 3.5–5.0)
Alkaline Phosphatase: 90 U/L (ref 38–126)
Anion gap: 9 (ref 5–15)
BILIRUBIN TOTAL: 0.5 mg/dL (ref 0.3–1.2)
BUN: 7 mg/dL (ref 6–20)
CHLORIDE: 105 mmol/L (ref 101–111)
CO2: 22 mmol/L (ref 22–32)
Calcium: 9.6 mg/dL (ref 8.9–10.3)
Creatinine, Ser: 0.65 mg/dL (ref 0.44–1.00)
Glucose, Bld: 113 mg/dL — ABNORMAL HIGH (ref 65–99)
POTASSIUM: 3.2 mmol/L — AB (ref 3.5–5.1)
Sodium: 136 mmol/L (ref 135–145)
TOTAL PROTEIN: 7.5 g/dL (ref 6.5–8.1)

## 2016-09-08 LAB — LIPASE, BLOOD: LIPASE: 21 U/L (ref 11–51)

## 2016-09-08 LAB — PREGNANCY, URINE: Preg Test, Ur: NEGATIVE

## 2016-09-08 MED ORDER — METRONIDAZOLE 500 MG PO TABS: 500.0000 mg | ORAL_TABLET | Freq: Two times a day (BID) | ORAL | 0 refills | Status: DC

## 2016-09-08 MED ORDER — OXYCODONE-ACETAMINOPHEN 5-325 MG PO TABS
1.0000 | ORAL_TABLET | Freq: Once | ORAL | Status: AC
  Administered 2016-09-08: 1 via ORAL
  Filled 2016-09-08: qty 1

## 2016-09-08 MED ORDER — KETOROLAC TROMETHAMINE 60 MG/2ML IM SOLN
60.0000 mg | Freq: Once | INTRAMUSCULAR | Status: AC
  Administered 2016-09-08: 60 mg via INTRAMUSCULAR
  Filled 2016-09-08: qty 2

## 2016-09-08 MED ORDER — METOCLOPRAMIDE HCL 5 MG/ML IJ SOLN
10.0000 mg | Freq: Once | INTRAMUSCULAR | Status: AC
  Administered 2016-09-08: 10 mg via INTRAMUSCULAR
  Filled 2016-09-08: qty 2

## 2016-09-08 NOTE — ED Triage Notes (Signed)
Pt reports she needs to be checked for STD and Hep C. Pt also c/o migraines that she has had daily for 3 weeks. Pt c/o abdominal cramping.

## 2016-09-08 NOTE — Discharge Instructions (Signed)
Your tests show you have bacterial vaginosis.  This is not necessarily sexually transmitted.  Be sure to take the medication as directed and avoid sex for at least 7-10 days.  You will be contacted by someone at the hospital for any positive results of your remaining tests

## 2016-09-08 NOTE — ED Triage Notes (Signed)
Delay explained to pt. VS rechecked.

## 2016-09-10 LAB — RPR: RPR Ser Ql: NONREACTIVE

## 2016-09-10 LAB — HEPATITIS PANEL, ACUTE
HEP A IGM: NEGATIVE
HEP B C IGM: NEGATIVE
HEP B S AG: NEGATIVE

## 2016-09-10 LAB — GC/CHLAMYDIA PROBE AMP (~~LOC~~) NOT AT ARMC
Chlamydia: NEGATIVE
Neisseria Gonorrhea: NEGATIVE

## 2016-09-10 LAB — HIV ANTIBODY (ROUTINE TESTING W REFLEX): HIV SCREEN 4TH GENERATION: NONREACTIVE

## 2016-09-12 NOTE — ED Provider Notes (Signed)
[facility] DEPT Provider Note   CSN: [id number] Arrival date & time: 09/08/16  1526     History   Chief Complaint Chief Complaint  Patient presents with  . SEXUALLY TRANSMITTED DISEASE    HPI Karen Shields is a 25 y.o. female.  HPI   Karen Shields is a 25 y.o. female who presents to the Emergency Department requesting evaluation of possible STD.  She also complains of intermittent frontal headaches for several days.  She states that her significant other has been cheating on her and she is concerned that he had hepatitis C.  She describes some vaginal discharge and intermittent lower abdominal cramping.  She denies fever, vomiting, abnormal vaginal bleeding, dysuria or genital lesions   Past Medical History:  Diagnosis Date  . Anxiety   . Asthma    WELL CONTROLLED  . Asthma   . Eczema 2008  . Gallstones   . GERD (gastroesophageal reflux disease)   . Headache   . Hx MRSA infection 09-2014  . Ovarian cyst     Patient Active Problem List   Diagnosis Date Noted  . Gallstone   . RUQ pain 12/25/2015  . Gallstones   . Reflux esophagitis   . Nausea without vomiting 11/04/2015  . Cholelithiases 11/04/2015  . GERD (gastroesophageal reflux disease) 11/04/2015  . Early satiety 11/04/2015  . MRSA (methicillin resistant staph aureus) culture positive 12/11/2014  . Menstrual migraine without status migrainosus, not intractable 07/10/2014  . Anxiety 06/14/2013  . Maternal substance abuse 04/14/2013  . H/O cold sores 04/12/2013  . Ovarian cyst, left 02/09/2011    Past Surgical History:  Procedure Laterality Date  . CHOLECYSTECTOMY N/A 01/16/2016   Procedure: LAPAROSCOPIC CHOLECYSTECTOMY;  Surgeon: Lattie Hawichard E Cooper, MD;  Location: [facility] ORS;  Service: General;  Laterality: N/A;  . ESOPHAGOGASTRODUODENOSCOPY N/A 11/13/2015   WGN:FAOZHYQRMR:erosive reflux   . OVARIAN CYST REMOVAL    . OVARIAN CYST REMOVAL  2011  . TUBAL LIGATION    . WISDOM TOOTH EXTRACTION      OB History     Gravida Para Term Preterm AB Living   2 2 1  0 0 2   SAB TAB Ectopic Multiple Live Births   0 0 0 0 1       Home Medications    Prior to Admission medications   Medication Sig Start Date End Date Taking? Authorizing Provider  albuterol (PROVENTIL HFA;VENTOLIN HFA) 108 (90 BASE) MCG/ACT inhaler Inhale 2 puffs into the lungs every 6 (six) hours as needed for wheezing. 11/08/14   Campbell Richesarolyn C Hoskins, NP  ALPRAZolam Prudy Feeler(XANAX) 1 MG tablet TAKE 1/2 TO 1 TABLET BY MOUTH TWICE DAILY AS NEEDED FOR ANXIETY. 03/10/16   Merlyn AlbertWilliam S Luking, MD  aspirin-acetaminophen-caffeine (EXCEDRIN MIGRAINE) [phone] MG tablet Take 1 tablet by mouth every 6 (six) hours as needed for headache.    Historical Provider, MD  chlorzoxazone (PARAFON) 500 MG tablet Take 1 tablet (500 mg total) by mouth 3 (three) times daily as needed for muscle spasms. 03/17/16   Merlyn AlbertWilliam S Luking, MD  cholestyramine Lanetta Inch(QUESTRAN) 4 g packet Use one pack in glass water up to bid prn diarrhea Patient taking differently: Take 4 g by mouth 2 (two) times daily. Use one pack in glass water up to bid prn diarrhea 03/17/16   Merlyn AlbertWilliam S Luking, MD  ciprofloxacin (CIPRO) 250 MG tablet Take 1 tablet (250 mg total) by mouth 2 (two) times daily. Patient not taking: Reported on 04/20/2016 03/17/16   Merlyn AlbertWilliam S Luking,  MD  dexlansoprazole (DEXILANT) 60 MG capsule Take 60 mg by mouth every morning. Reported on 03/17/2016    Historical Provider, MD  diclofenac (VOLTAREN) 75 MG EC tablet Take 1 tablet (75 mg total) by mouth 2 (two) times daily. 04/13/16   Ivery Quale, PA-C  escitalopram (LEXAPRO) 20 MG tablet TAKE ONE TABLET BY MOUTH ONCE DAILY. Patient taking differently: TAKE ONE TABLET BY MOUTH ONCE DAILY-AM 12/15/15   Merlyn Albert, MD  HYDROcodone-acetaminophen (NORCO/VICODIN) 5-325 MG tablet Take 1 tablet by mouth every 6 (six) hours as needed. 04/20/16   Bethann Berkshire, MD  methocarbamol (ROBAXIN) 500 MG tablet Take 1 tablet (500 mg total) by mouth 3 (three) times  daily. 04/13/16   Ivery Quale, PA-C  metroNIDAZOLE (FLAGYL) 500 MG tablet Take 1 tablet (500 mg total) by mouth 2 (two) times daily. For 7 days 09/08/16   Loria Lacina, PA-C  naproxen (NAPROSYN) 500 MG tablet Take 1 tablet (500 mg total) by mouth 2 (two) times daily with a meal. Patient not taking: Reported on 04/20/2016 03/17/16   Merlyn Albert, MD  oxyCODONE-acetaminophen (ROXICET) 5-325 MG tablet Take 1 tablet by mouth every 4 (four) hours as needed for moderate pain or severe pain. 01/27/16   Ricarda Frame, MD    Family History Family History  Problem Relation Age of Onset  . Diabetes Mother   . Colon cancer Neg Hx   . Crohn's disease Neg Hx   . Celiac disease Neg Hx     Social History Social History  Substance Use Topics  . Smoking status: Current Every Day Smoker    Packs/day: 1.00    Years: 5.00    Types: Cigarettes    Start date: 02/11/2006  . Smokeless tobacco: Never Used  . Alcohol use 0.0 oz/week     Comment: occassionally     Allergies   Latex   Review of Systems Review of Systems  Constitutional: Negative for appetite change, chills and fever.  Respiratory: Negative for shortness of breath.   Cardiovascular: Negative for chest pain.  Gastrointestinal: Positive for abdominal pain (abdomainl cramping). Negative for blood in stool, nausea and vomiting.  Genitourinary: Positive for vaginal discharge. Negative for decreased urine volume, difficulty urinating, dysuria, flank pain and vaginal bleeding.  Musculoskeletal: Negative for back pain, neck pain and neck stiffness.  Skin: Negative for color change and rash.  Neurological: Positive for headaches. Negative for dizziness, weakness and numbness.  Hematological: Negative for adenopathy.  All other systems reviewed and are negative.    Physical Exam Updated Vital Signs BP 118/72   Pulse 90   Temp 98.3 F (36.8 C) (Oral)   Resp 18   Ht 5\' 9"  (1.753 m)   Wt 72.6 kg   LMP 08/31/2016   SpO2 98%   BMI  23.63 kg/m   Physical Exam  Constitutional: She is oriented to person, place, and time. She appears well-developed and well-nourished. No distress.  HENT:  Head: Normocephalic and atraumatic.  Mouth/Throat: Oropharynx is clear and moist.  Neck: Normal range of motion.  Cardiovascular: Normal rate, regular rhythm and intact distal pulses.   No murmur heard. Pulmonary/Chest: Effort normal and breath sounds normal. No respiratory distress.  Abdominal: Soft. Bowel sounds are normal. She exhibits no distension and no mass. There is no tenderness. There is no rebound and no guarding.  Genitourinary:  Genitourinary Comments: Pelvic exam chaperoned by nursing.  No vaginal bleeding, genital lesions, no adnexal masses, or tenderness.  No CMT.  Whitish vaginal  discharge.    Musculoskeletal: Normal range of motion. She exhibits no edema.  Lymphadenopathy:    She has no cervical adenopathy.  Neurological: She is alert and oriented to person, place, and time. She exhibits normal muscle tone. Coordination normal.  Skin: Skin is warm and dry.  Nursing note and vitals reviewed.    ED Treatments / Results  Labs (all labs ordered are listed, but only abnormal results are displayed) Labs Reviewed  WET PREP, GENITAL - Abnormal; Notable for the following:       Result Value   Clue Cells Wet Prep HPF POC PRESENT (*)    WBC, Wet Prep HPF POC MANY (*)    All other components within normal limits  COMPREHENSIVE METABOLIC PANEL - Abnormal; Notable for the following:    Potassium 3.2 (*)    Glucose, Bld 113 (*)    All other components within normal limits  CBC - Abnormal; Notable for the following:    WBC 11.3 (*)    Platelets 410 (*)    All other components within normal limits  LIPASE, BLOOD  URINALYSIS, ROUTINE W REFLEX MICROSCOPIC  RPR  HIV ANTIBODY (ROUTINE TESTING)  HEPATITIS PANEL, ACUTE  PREGNANCY, URINE  GC/CHLAMYDIA PROBE AMP ([place]) NOT AT [facility]    EKG  EKG  Interpretation None       Radiology No results found.  Procedures Procedures (including critical care time)  Medications Ordered in ED Medications  ketorolac (TORADOL) injection 60 mg (60 mg Intramuscular Given 09/08/16 2216)  metoCLOPramide (REGLAN) injection 10 mg (10 mg Intramuscular Given 09/08/16 2216)  oxyCODONE-acetaminophen (PERCOCET/ROXICET) 5-325 MG per tablet 1 tablet (1 tablet Oral Given 09/08/16 2215)     Initial Impression / Assessment and Plan / ED Course  I have reviewed the triage vital signs and the nursing notes.  Pertinent labs & imaging results that were available during my care of the patient were reviewed by me and considered in my medical decision making (see chart for details).  Clinical Course     Pt well appearing.  abd is soft, NT on exam.  Vaginal d/c with clue cells on wet prep.  Cultures pending.    rx for flagyl.  Headache frontal and intermittent.  No focal neuro deficits,.  No nuchal rigidity.  Feeling better after medications given.    Final Clinical Impressions(s) / ED Diagnoses   Final diagnoses:  Bacterial vaginosis    New Prescriptions Discharge Medication List as of 09/08/2016 10:43 PM       Jeremaih Klima Trisha Mangleriplett, PA-C 09/12/16 1542    Eber HongBrian Miller, MD 09/14/16 1811

## 2016-10-19 ENCOUNTER — Emergency Department (HOSPITAL_COMMUNITY)
Admission: EM | Admit: 2016-10-19 | Discharge: 2016-10-20 | Disposition: A | Discharge status: Home / Self Care | Payer: Medicaid Other | Attending: Emergency Medicine | Admitting: Emergency Medicine

## 2016-10-19 ENCOUNTER — Encounter (HOSPITAL_COMMUNITY): Payer: Self-pay | Admitting: Emergency Medicine

## 2016-10-19 DIAGNOSIS — R1031 Right lower quadrant pain: Secondary | ICD-10-CM | POA: Insufficient documentation

## 2016-10-19 DIAGNOSIS — F17210 Nicotine dependence, cigarettes, uncomplicated: Secondary | ICD-10-CM | POA: Diagnosis not present

## 2016-10-19 DIAGNOSIS — Z79899 Other long term (current) drug therapy: Secondary | ICD-10-CM | POA: Diagnosis not present

## 2016-10-19 DIAGNOSIS — R102 Pelvic and perineal pain: Secondary | ICD-10-CM | POA: Diagnosis not present

## 2016-10-19 DIAGNOSIS — J45909 Unspecified asthma, uncomplicated: Secondary | ICD-10-CM | POA: Insufficient documentation

## 2016-10-19 LAB — CBC WITH DIFFERENTIAL/PLATELET
BASOS ABS: 0 10*3/uL (ref 0.0–0.1)
BASOS PCT: 0 %
EOS PCT: 4 %
Eosinophils Absolute: 0.4 10*3/uL (ref 0.0–0.7)
HCT: 40.1 % (ref 36.0–46.0)
Hemoglobin: 13.7 g/dL (ref 12.0–15.0)
Lymphocytes Relative: 32 %
Lymphs Abs: 3.3 10*3/uL (ref 0.7–4.0)
MCH: 30.3 pg (ref 26.0–34.0)
MCHC: 34.2 g/dL (ref 30.0–36.0)
MCV: 88.7 fL (ref 78.0–100.0)
MONO ABS: 0.6 10*3/uL (ref 0.1–1.0)
Monocytes Relative: 6 %
NEUTROS ABS: 5.8 10*3/uL (ref 1.7–7.7)
Neutrophils Relative %: 58 %
PLATELETS: 387 10*3/uL (ref 150–400)
RBC: 4.52 MIL/uL (ref 3.87–5.11)
RDW: 12.6 % (ref 11.5–15.5)
WBC: 10.1 10*3/uL (ref 4.0–10.5)

## 2016-10-19 LAB — BASIC METABOLIC PANEL
ANION GAP: 6 (ref 5–15)
BUN: 7 mg/dL (ref 6–20)
CALCIUM: 9.2 mg/dL (ref 8.9–10.3)
CO2: 29 mmol/L (ref 22–32)
Chloride: 104 mmol/L (ref 101–111)
Creatinine, Ser: 0.71 mg/dL (ref 0.44–1.00)
Glucose, Bld: 86 mg/dL (ref 65–99)
Potassium: 3.7 mmol/L (ref 3.5–5.1)
Sodium: 139 mmol/L (ref 135–145)

## 2016-10-19 NOTE — ED Notes (Signed)
Pt made aware of the wait time and has no complaints.

## 2016-10-19 NOTE — ED Triage Notes (Signed)
Patient complaining of right pelvic pain x 2 months. States "I was told I had inflammation and fluid in my fallopian tube and needed to follow up with an OBGYN but I haven't because I don't have the money to pay for it." Also complaining of fatigue and pain to right shoulder blade.

## 2016-10-20 LAB — URINALYSIS, ROUTINE W REFLEX MICROSCOPIC
Bilirubin Urine: NEGATIVE
Glucose, UA: NEGATIVE mg/dL
Hgb urine dipstick: NEGATIVE
Ketones, ur: NEGATIVE mg/dL
Leukocytes, UA: NEGATIVE
Nitrite: POSITIVE — AB
PROTEIN: NEGATIVE mg/dL
SPECIFIC GRAVITY, URINE: 1.017 (ref 1.005–1.030)
WBC UA: NONE SEEN WBC/hpf (ref 0–5)
pH: 7 (ref 5.0–8.0)

## 2016-10-20 LAB — PREGNANCY, URINE: PREG TEST UR: NEGATIVE

## 2016-10-20 MED ORDER — ACETAMINOPHEN 500 MG PO TABS
1000.0000 mg | ORAL_TABLET | Freq: Once | ORAL | Status: AC
  Administered 2016-10-20: 1000 mg via ORAL
  Filled 2016-10-20: qty 2

## 2016-10-20 MED ORDER — ONDANSETRON 8 MG PO TBDP
8.0000 mg | ORAL_TABLET | Freq: Once | ORAL | Status: AC
  Administered 2016-10-20: 8 mg via ORAL
  Filled 2016-10-20: qty 1

## 2016-10-20 MED ORDER — NAPROXEN 500 MG PO TABS: 500.0000 mg | ORAL_TABLET | Freq: Two times a day (BID) | ORAL | 0 refills | Status: DC

## 2016-10-20 MED ORDER — KETOROLAC TROMETHAMINE 60 MG/2ML IM SOLN
60.0000 mg | Freq: Once | INTRAMUSCULAR | Status: AC
  Administered 2016-10-20: 60 mg via INTRAMUSCULAR
  Filled 2016-10-20: qty 2

## 2016-10-20 MED ORDER — ONDANSETRON 8 MG PO TBDP: 8.0000 mg | ORAL_TABLET | Freq: Three times a day (TID) | ORAL | 0 refills | Status: DC | PRN

## 2016-10-20 NOTE — Discharge Instructions (Signed)
In addition to the medicine prescribed,  a heating pad applied to your site of pain may also help with pain.

## 2016-10-20 NOTE — ED Notes (Signed)
Pt ambulatory to waiting room. Pt verbalized understanding of discharge instructions.   

## 2016-10-21 MED ORDER — CEPHALEXIN 500 MG PO CAPS: 500.0000 mg | ORAL_CAPSULE | Freq: Four times a day (QID) | ORAL | 0 refills | Status: DC

## 2016-10-21 NOTE — ED Provider Notes (Signed)
[facility]-EMER[facility]ENCY DEPT Provider Note   CSN: [id number] Arrival date & time: 10/19/16  2100     History   Chief Complaint Chief Complaint  Patient presents with  . Pelvic Pain    HPI Karen Shields is a 26 y.o. female presenting with a now 2 month history of right lower pelvic pain. She was seen for this problem at [facility] on Jan 4 and an ultrasound revealed a thickened, dilated right Fallopian tube (per US report reviewed) and she was referred to gynecology but has been unable to follow up due to financial concerns, although pt does have medical insurance.  She denies fevers, chills, vaginal discharge, back pain, vomiting but does endorse occasional nausea.  She states she had a full pelvic exam during her Jan 4 visit and was treated for std's including a "shot and some pills" but states her cultures returned negative. She is here for pain assistance.  She denies any new or worsened symptoms today.  The history is provided by the patient.    Past Medical History:  Diagnosis Date  . Anxiety   . Asthma    WELL CONTROLLED  . Asthma   . Eczema 2008  . Gallstones   . GERD (gastroesophageal reflux disease)   . Headache   . Hx MRSA infection 09-2014  . Ovarian cyst     Patient Active Problem List   Diagnosis Date Noted  . Gallstone   . RUQ pain 12/25/2015  . Gallstones   . Reflux esophagitis   . Nausea without vomiting 11/04/2015  . Cholelithiases 11/04/2015  . GERD (gastroesophageal reflux disease) 11/04/2015  . Early satiety 11/04/2015  . MRSA (methicillin resistant staph aureus) culture positive 12/11/2014  . Menstrual migraine without status migrainosus, not intractable 07/10/2014  . Anxiety 06/14/2013  . Maternal substance abuse 04/14/2013  . H/O cold sores 04/12/2013  . Ovarian cyst, left 02/09/2011    Past Surgical History:  Procedure Laterality Date  . CHOLECYSTECTOMY N/A 01/16/2016   Procedure: LAPAROSCOPIC CHOLECYSTECTOMY;  Surgeon: Lattie Hawichard E Cooper,  MD;  Location: [facility] ORS;  Service: General;  Laterality: N/A;  . ESOPHAGOGASTRODUODENOSCOPY N/A 11/13/2015   WUJ:WJXBJYNRMR:erosive reflux   . OVARIAN CYST REMOVAL    . OVARIAN CYST REMOVAL  2011  . TUBAL LIGATION    . WISDOM TOOTH EXTRACTION      OB History    Gravida Para Term Preterm AB Living   2 2 1  0 0 2   SAB TAB Ectopic Multiple Live Births   0 0 0 0 1       Home Medications    Prior to Admission medications   Medication Sig Start Date End Date Taking? Authorizing Provider  albuterol (PROVENTIL HFA;VENTOLIN HFA) 108 (90 BASE) MCG/ACT inhaler Inhale 2 puffs into the lungs every 6 (six) hours as needed for wheezing. 11/08/14   Campbell Richesarolyn C Hoskins, NP  ALPRAZolam Prudy Feeler(XANAX) 1 MG tablet TAKE 1/2 TO 1 TABLET BY MOUTH TWICE DAILY AS NEEDED FOR ANXIETY. 03/10/16   Merlyn AlbertWilliam S Luking, MD  aspirin-acetaminophen-caffeine (EXCEDRIN MIGRAINE) [phone] MG tablet Take 1 tablet by mouth every 6 (six) hours as needed for headache.    Historical Provider, MD  chlorzoxazone (PARAFON) 500 MG tablet Take 1 tablet (500 mg total) by mouth 3 (three) times daily as needed for muscle spasms. 03/17/16   Merlyn AlbertWilliam S Luking, MD  cholestyramine Lanetta Inch(QUESTRAN) 4 g packet Use one pack in glass water up to bid prn diarrhea Patient taking differently: Take 4 g by mouth  2 (two) times daily. Use one pack in glass water up to bid prn diarrhea 03/17/16   Merlyn Albert, MD  ciprofloxacin (CIPRO) 250 MG tablet Take 1 tablet (250 mg total) by mouth 2 (two) times daily. Patient not taking: Reported on 04/20/2016 03/17/16   Merlyn Albert, MD  dexlansoprazole (DEXILANT) 60 MG capsule Take 60 mg by mouth every morning. Reported on 03/17/2016    Historical Provider, MD  diclofenac (VOLTAREN) 75 MG EC tablet Take 1 tablet (75 mg total) by mouth 2 (two) times daily. 04/13/16   Ivery Quale, PA-C  escitalopram (LEXAPRO) 20 MG tablet TAKE ONE TABLET BY MOUTH ONCE DAILY. Patient taking differently: TAKE ONE TABLET BY MOUTH ONCE DAILY-AM 12/15/15    Merlyn Albert, MD  HYDROcodone-acetaminophen (NORCO/VICODIN) 5-325 MG tablet Take 1 tablet by mouth every 6 (six) hours as needed. 04/20/16   Bethann Berkshire, MD  methocarbamol (ROBAXIN) 500 MG tablet Take 1 tablet (500 mg total) by mouth 3 (three) times daily. 04/13/16   Ivery Quale, PA-C  metroNIDAZOLE (FLAGYL) 500 MG tablet Take 1 tablet (500 mg total) by mouth 2 (two) times daily. For 7 days 09/08/16   Tammy Triplett, PA-C  naproxen (NAPROSYN) 500 MG tablet Take 1 tablet (500 mg total) by mouth 2 (two) times daily. 10/20/16   Burgess Amor, PA-C  ondansetron (ZOFRAN ODT) 8 MG disintegrating tablet Take 1 tablet (8 mg total) by mouth every 8 (eight) hours as needed for nausea or vomiting. 10/20/16   Burgess Amor, PA-C  oxyCODONE-acetaminophen (ROXICET) 5-325 MG tablet Take 1 tablet by mouth every 4 (four) hours as needed for moderate pain or severe pain. 01/27/16   Ricarda Frame, MD    Family History Family History  Problem Relation Age of Onset  . Diabetes Mother   . Colon cancer Neg Hx   . Crohn's disease Neg Hx   . Celiac disease Neg Hx     Social History Social History  Substance Use Topics  . Smoking status: Current Every Day Smoker    Packs/day: 1.00    Years: 5.00    Types: Cigarettes    Start date: 02/11/2006  . Smokeless tobacco: Never Used  . Alcohol use 0.0 oz/week     Comment: occassionally     Allergies   Latex   Review of Systems Review of Systems  Constitutional: Negative for fever.  HENT: Negative for congestion and sore throat.   Eyes: Negative.   Respiratory: Negative for chest tightness and shortness of breath.   Cardiovascular: Negative for chest pain.  Gastrointestinal: Negative for abdominal pain, nausea and vomiting.  Genitourinary: Positive for pelvic pain. Negative for dysuria, flank pain, vaginal discharge and vaginal pain.  Musculoskeletal: Negative for arthralgias, joint swelling and neck pain.  Skin: Negative.  Negative for rash and wound.    Neurological: Negative for dizziness, weakness, light-headedness, numbness and headaches.  Psychiatric/Behavioral: Negative.      Physical Exam Updated Vital Signs BP 106/62 (BP Location: Left Arm)   Pulse 63   Temp 97.7 F (36.5 C) (Oral)   Resp 18   Ht 5\' 4"  (1.626 m)   Wt 76.2 kg   LMP 10/12/2016   SpO2 97%   BMI 28.84 kg/m   Physical Exam  Constitutional: She appears well-developed and well-nourished.  HENT:  Head: Normocephalic and atraumatic.  Eyes: Conjunctivae are normal.  Neck: Normal range of motion.  Cardiovascular: Normal rate, regular rhythm, normal heart sounds and intact distal pulses.   Pulmonary/Chest: Effort  normal and breath sounds normal. She has no wheezes.  Abdominal: Soft. Bowel sounds are normal. There is tenderness in the right lower quadrant. There is no rigidity, no rebound, no guarding and no tenderness at McBurney's point.  Musculoskeletal: Normal range of motion.  Neurological: She is alert.  Skin: Skin is warm and dry.  Psychiatric: She has a normal mood and affect.  Nursing note and vitals reviewed.    ED Treatments / Results  Labs (all labs ordered are listed, but only abnormal results are displayed) Labs Reviewed     URINALYSIS, ROUTINE W REFLEX MICROSCOPIC - Abnormal; Notable for the following:    APPearance HAZY (*)    Nitrite POSITIVE (*)    Bacteria, UA MANY (*)    All other components within normal limits  CBC WITH DIFFERENTIAL/PLATELET  BASIC METABOLIC PANEL  PREGNANCY, URINE    EKG  EKG Interpretation None       Radiology No results found.  Procedures Procedures (including critical care time)  Medications Ordered in ED Medications  ketorolac (TORADOL) injection 60 mg (60 mg Intramuscular Given 10/20/16 0107)  ondansetron (ZOFRAN-ODT) disintegrating tablet 8 mg (8 mg Oral Given 10/20/16 0106)  acetaminophen (TYLENOL) tablet 1,000 mg (1,000 mg Oral Given 10/20/16 0206)     Initial Impression / Assessment and  Plan / ED Course  I have reviewed the triage vital signs and the nursing notes.  Pertinent labs & imaging results that were available during my care of the patient were reviewed by me and considered in my medical decision making (see chart for details).     Previous US from 09/23/16 reviewed.  Pt advised she needs f/u with gyn, referral given to [facility].  She was treated with toradol and tylenol.   Discussed urine findings. Pt has no urinary sx, denies dysuria, increased frequency, hematuria, however, given nitrites, will tx.  Pending culture results.  Final Clinical Impressions(s) / ED Diagnoses   Final diagnoses:  Pelvic pain in female    New Prescriptions Discharge Medication List as of 10/20/2016  1:41 AM    START taking these medications   Details  ondansetron (ZOFRAN ODT) 8 MG disintegrating tablet Take 1 tablet (8 mg total) by mouth every 8 (eight) hours as needed for nausea or vomiting., Starting Wed 10/20/2016, Print         Burgess Amor, PA-C 10/21/16 1401    Burgess Amor, PA-C 10/21/16 1404    Devoria Albe, MD 10/21/16 2303

## 2016-10-22 DIAGNOSIS — N7011 Chronic salpingitis: Secondary | ICD-10-CM | POA: Diagnosis not present

## 2016-10-22 DIAGNOSIS — Z6829 Body mass index (BMI) 29.0-29.9, adult: Secondary | ICD-10-CM | POA: Diagnosis not present

## 2016-10-22 DIAGNOSIS — R102 Pelvic and perineal pain: Secondary | ICD-10-CM | POA: Diagnosis not present

## 2016-10-22 DIAGNOSIS — N3000 Acute cystitis without hematuria: Secondary | ICD-10-CM | POA: Diagnosis not present

## 2016-10-22 DIAGNOSIS — Z113 Encounter for screening for infections with a predominantly sexual mode of transmission: Secondary | ICD-10-CM | POA: Diagnosis not present

## 2016-10-22 LAB — URINE CULTURE
Culture: >=100000 — AB
Special Requests: NORMAL

## 2016-10-23 ENCOUNTER — Telehealth: Payer: Self-pay

## 2016-10-23 NOTE — Telephone Encounter (Signed)
Post ED Visit - Positive Culture Follow-up  Culture report reviewed by antimicrobial stewardship pharmacist:  []  Enzo BiNathan Batchelder, Pharm.D. []  Celedonio MiyamotoJeremy Frens, Pharm.D., BCPS []  Garvin FilaMike Maccia, Pharm.D. [x]  Georgina PillionElizabeth Martin, Pharm.D., BCPS []  Whispering PinesMinh Pham, [address]Ph[address]D., BCPS, AAHIVP []  Estella HuskMichelle Turner, Pharm.D., BCPS, AAHIVP []  Tennis Mustassie Stewart, [address]Pharm[address] []  Sherle Poeob Vincent, [place]arm[place]D.  Positive urien culture Treated with Cephalexin, organism sensitive to the same and no further patient follow-up is required at this time.  Jerry CarasCullom, Charnika Herbst Burnett 10/23/2016, 4:35 PM

## 2016-12-10 DIAGNOSIS — E662 Morbid (severe) obesity with alveolar hypoventilation: Secondary | ICD-10-CM | POA: Diagnosis not present

## 2016-12-10 DIAGNOSIS — R5382 Chronic fatigue, unspecified: Secondary | ICD-10-CM | POA: Diagnosis not present

## 2016-12-10 DIAGNOSIS — J45909 Unspecified asthma, uncomplicated: Secondary | ICD-10-CM | POA: Diagnosis not present

## 2016-12-10 DIAGNOSIS — M2550 Pain in unspecified joint: Secondary | ICD-10-CM | POA: Diagnosis not present

## 2017-02-27 ENCOUNTER — Emergency Department (HOSPITAL_COMMUNITY)
Admission: EM | Admit: 2017-02-27 | Discharge: 2017-02-27 | Disposition: A | Discharge status: Home / Self Care | Payer: Medicaid Other | Attending: Emergency Medicine | Admitting: Emergency Medicine

## 2017-02-27 ENCOUNTER — Encounter (HOSPITAL_COMMUNITY): Payer: Self-pay | Admitting: *Deleted

## 2017-02-27 DIAGNOSIS — R21 Rash and other nonspecific skin eruption: Secondary | ICD-10-CM | POA: Diagnosis present

## 2017-02-27 DIAGNOSIS — L0100 Impetigo, unspecified: Secondary | ICD-10-CM | POA: Insufficient documentation

## 2017-02-27 DIAGNOSIS — J45909 Unspecified asthma, uncomplicated: Secondary | ICD-10-CM | POA: Insufficient documentation

## 2017-02-27 DIAGNOSIS — F17210 Nicotine dependence, cigarettes, uncomplicated: Secondary | ICD-10-CM | POA: Diagnosis not present

## 2017-02-27 MED ORDER — MUPIROCIN CALCIUM 2 % NA OINT: TOPICAL_OINTMENT | NASAL | 1 refills | Status: DC

## 2017-02-27 MED ORDER — DOXYCYCLINE HYCLATE 100 MG PO CAPS: 100.0000 mg | ORAL_CAPSULE | Freq: Two times a day (BID) | ORAL | 0 refills | Status: DC

## 2017-02-27 NOTE — ED Provider Notes (Signed)
[facility]-EMERG[facility]Y DEPT Provider Note   CSN: [id number] Arrival date & time: 02/27/17  1550     History   Chief Complaint Chief Complaint  Patient presents with  . Rash    HPI Karen Shields is a 26 y.o. female.  Patient is a 26 year old female who presents to the emergency department with a rash/boil to the left abdomen.  The patient states that a few weeks ago she developed a boil on her thigh, this went away on its own. Later she noticed a small raised pimple on her left abdomen. It eventually started to drain a small amount of fluid and then she had on other small bumps some with low white pustules or heads at the top of them. She states that they itch, and there is sore when she lays on them on. She also attempted to cover these with a bandage. She states the area where she had the bandage is now causing her irritation. She has not had a measured temperature elevation. She states at times she thinks that she may have been a little bit warm on. She's not had nausea or vomiting. She has no medical conditions that should interfere with her immune system.   The history is provided by the patient.  Rash   This is a new problem. The current episode started more than 1 week ago. The problem has been gradually worsening.    Past Medical History:  Diagnosis Date  . Anxiety   . Asthma    WELL CONTROLLED  . Asthma   . Eczema 2008  . Gallstones   . GERD (gastroesophageal reflux disease)   . Headache   . Hx MRSA infection 09-2014  . Ovarian cyst     Patient Active Problem List   Diagnosis Date Noted  . Gallstone   . RUQ pain 12/25/2015  . Gallstones   . Reflux esophagitis   . Nausea without vomiting 11/04/2015  . Cholelithiases 11/04/2015  . GERD (gastroesophageal reflux disease) 11/04/2015  . Early satiety 11/04/2015  . MRSA (methicillin resistant staph aureus) culture positive 12/11/2014  . Menstrual migraine without status migrainosus, not intractable 07/10/2014  .  Anxiety 06/14/2013  . Maternal substance abuse 04/14/2013  . H/O cold sores 04/12/2013  . Ovarian cyst, left 02/09/2011    Past Surgical History:  Procedure Laterality Date  . CHOLECYSTECTOMY N/A 01/16/2016   Procedure: LAPAROSCOPIC CHOLECYSTECTOMY;  Surgeon: Lattie Hawichard E Cooper, MD;  Location: [facility] ORS;  Service: General;  Laterality: N/A;  . ESOPHAGOGASTRODUODENOSCOPY N/A 11/13/2015   WUJ:WJXBJYNRMR:erosive reflux   . OVARIAN CYST REMOVAL    . OVARIAN CYST REMOVAL  2011  . TUBAL LIGATION    . WISDOM TOOTH EXTRACTION      OB History    Gravida Para Term Preterm AB Living   2 2 1  0 0 2   SAB TAB Ectopic Multiple Live Births   0 0 0 0 1       Home Medications    Prior to Admission medications   Medication Sig Start Date End Date Taking? Authorizing Provider  albuterol (PROVENTIL HFA;VENTOLIN HFA) 108 (90 BASE) MCG/ACT inhaler Inhale 2 puffs into the lungs every 6 (six) hours as needed for wheezing. 11/08/14   Campbell RichesHoskins, Carolyn C, NP  ALPRAZolam Prudy Feeler(XANAX) 1 MG tablet TAKE 1/2 TO 1 TABLET BY MOUTH TWICE DAILY AS NEEDED FOR ANXIETY. 03/10/16   Merlyn AlbertLuking, William S, MD  aspirin-acetaminophen-caffeine (EXCEDRIN MIGRAINE) [phone] MG tablet Take 1 tablet by mouth every 6 (six) hours as  needed for headache.    [provider]  cephALEXin (KEFLEX) 500 MG capsule Take 1 capsule (500 mg total) by mouth 4 (four) times daily. 10/21/16   Burgess Amor, PA-C  chlorzoxazone (PARAFON) 500 MG tablet Take 1 tablet (500 mg total) by mouth 3 (three) times daily as needed for muscle spasms. 03/17/16   Merlyn Albert, MD  cholestyramine Lanetta Inch) 4 g packet Use one pack in glass water up to bid prn diarrhea Patient taking differently: Take 4 g by mouth 2 (two) times daily. Use one pack in glass water up to bid prn diarrhea 03/17/16   Merlyn Albert, MD  ciprofloxacin (CIPRO) 250 MG tablet Take 1 tablet (250 mg total) by mouth 2 (two) times daily. Patient not taking: Reported on 04/20/2016 03/17/16   Merlyn Albert, MD  dexlansoprazole (DEXILANT) 60 MG capsule Take 60 mg by mouth every morning. Reported on 03/17/2016    [provider]  diclofenac (VOLTAREN) 75 MG EC tablet Take 1 tablet (75 mg total) by mouth 2 (two) times daily. 04/13/16   Ivery Quale, PA-C  escitalopram (LEXAPRO) 20 MG tablet TAKE ONE TABLET BY MOUTH ONCE DAILY. Patient taking differently: TAKE ONE TABLET BY MOUTH ONCE DAILY-AM 12/15/15   Merlyn Albert, MD  HYDROcodone-acetaminophen (NORCO/VICODIN) 5-325 MG tablet Take 1 tablet by mouth every 6 (six) hours as needed. 04/20/16   Bethann Berkshire, MD  methocarbamol (ROBAXIN) 500 MG tablet Take 1 tablet (500 mg total) by mouth 3 (three) times daily. 04/13/16   Ivery Quale, PA-C  metroNIDAZOLE (FLAGYL) 500 MG tablet Take 1 tablet (500 mg total) by mouth 2 (two) times daily. For 7 days 09/08/16   Triplett, Tammy, PA-C  naproxen (NAPROSYN) 500 MG tablet Take 1 tablet (500 mg total) by mouth 2 (two) times daily. 10/20/16   Burgess Amor, PA-C  ondansetron (ZOFRAN ODT) 8 MG disintegrating tablet Take 1 tablet (8 mg total) by mouth every 8 (eight) hours as needed for nausea or vomiting. 10/20/16   Idol, Raynelle Fanning, PA-C  oxyCODONE-acetaminophen (ROXICET) 5-325 MG tablet Take 1 tablet by mouth every 4 (four) hours as needed for moderate pain or severe pain. 01/27/16   Ricarda Frame, MD    Family History Family History  Problem Relation Age of Onset  . Diabetes Mother   . Colon cancer Neg Hx   . Crohn's disease Neg Hx   . Celiac disease Neg Hx     Social History Social History  Substance Use Topics  . Smoking status: Current Every Day Smoker    Packs/day: 1.00    Years: 5.00    Types: Cigarettes    Start date: 02/11/2006  . Smokeless tobacco: Never Used  . Alcohol use 0.0 oz/week     Comment: occassionally     Allergies   Latex   Review of Systems Review of Systems  Constitutional: Negative for activity change and appetite change.  HENT: Negative for congestion, ear  discharge, ear pain, facial swelling, nosebleeds, rhinorrhea, sneezing and tinnitus.   Eyes: Negative for photophobia, pain and discharge.  Respiratory: Negative for cough, choking, shortness of breath and wheezing.   Cardiovascular: Negative for chest pain, palpitations and leg swelling.  Gastrointestinal: Negative for abdominal pain, blood in stool, constipation, diarrhea, nausea and vomiting.  Genitourinary: Negative for difficulty urinating, dysuria, flank pain, frequency and hematuria.  Musculoskeletal: Negative for back pain, gait problem, myalgias and neck pain.  Skin: Positive for rash. Negative for color change and wound.  Neurological: Negative  for dizziness, seizures, syncope, facial asymmetry, speech difficulty, weakness and numbness.  Hematological: Negative for adenopathy. Does not bruise/bleed easily.  Psychiatric/Behavioral: Negative for agitation, confusion, hallucinations, self-injury and suicidal ideas. The patient is not nervous/anxious.      Physical Exam Updated Vital Signs BP 128/85   Pulse 98   Temp 98 F (36.7 C) (Oral)   Resp 18   Ht 5\' 3"  (1.6 m)   Wt 78 kg (172 lb)   LMP 02/13/2017   SpO2 97%   BMI 30.47 kg/m   Physical Exam  Constitutional: Vital signs are normal. She appears well-developed and well-nourished. She is active.  HENT:  Head: Normocephalic and atraumatic.  Right Ear: Tympanic membrane, external ear and ear canal normal.  Left Ear: Tympanic membrane, external ear and ear canal normal.  Nose: Nose normal.  Mouth/Throat: Uvula is midline, oropharynx is clear and moist and mucous membranes are normal.  Eyes: Conjunctivae, EOM and lids are normal. Pupils are equal, round, and reactive to light.  Neck: Trachea normal, normal range of motion and phonation normal. Neck supple. Carotid bruit is not present.  Cardiovascular: Normal rate, regular rhythm and normal pulses.   Abdominal: Soft. Normal appearance and bowel sounds are normal.    Lymphadenopathy:       Head (right side): No submental, no preauricular and no posterior auricular adenopathy present.       Head (left side): No submental, no preauricular and no posterior auricular adenopathy present.    She has no cervical adenopathy.  Neurological: She is alert. She has normal strength. No cranial nerve deficit or sensory deficit. GCS eye subscore is 4. GCS verbal subscore is 5. GCS motor subscore is 6.  Skin: Skin is warm and dry.  There are 4 small red raised areas with scratched blisters and some of them pus filled the top. There are 3 blisters in which the area is intact. There no red streaks appreciated.  There is mild redness and papular rash of the same left upper abdomen in the configuration of the Band-Aid.  Psychiatric: Her speech is normal.     ED Treatments / Results  Labs (all labs ordered are listed, but only abnormal results are displayed) Labs Reviewed - No data to display  EKG  EKG Interpretation None       Radiology No results found.  Procedures Procedures (including critical care time)  Medications Ordered in ED Medications - No data to display   Initial Impression / Assessment and Plan / ED Course  I have reviewed the triage vital signs and the nursing notes.  Pertinent labs & imaging results that were available during my care of the patient were reviewed by me and considered in my medical decision making (see chart for details).      Final Clinical Impressions(s) / ED Diagnoses MDM The vital signs within normal limits. The examination favors impetigo. Patient will be treated with Bactroban and doxycycline. Patient is to follow-up with Dr. Delbert Harness for additional evaluation and management.    Final diagnoses:  Impetigo    New Prescriptions New Prescriptions   DOXYCYCLINE (VIBRAMYCIN) 100 MG CAPSULE    Take 1 capsule (100 mg total) by mouth 2 (two) times daily.   MUPIROCIN NASAL OINTMENT (BACTROBAN) 2 %    Apply to rash  on abdomen three times daily.     Ivery Quale, PA-C 02/27/17 1732    Donnetta Hutching, MD 02/28/17 [phone]

## 2017-02-27 NOTE — Discharge Instructions (Signed)
Please cleanse the rash area daily with soap and water. Please wash hands frequently. Use paper tape instead of her the regular Band-Aids. Please use doxycycline 2 times daily with food. Use the Bactroban 3 times daily to the rash area. Please see Dr. Delbert Harnesson Diego for additional evaluation and management if not improving.

## 2017-02-27 NOTE — ED Triage Notes (Signed)
Pt c/o red bumpy rash to left side of abdomen x 1.5 weeks. Pt reports some Martena Emanuele drainage from site intermittently. Denies fever.

## 2017-03-04 ENCOUNTER — Emergency Department (HOSPITAL_COMMUNITY)
Admission: EM | Admit: 2017-03-04 | Discharge: 2017-03-04 | Disposition: A | Discharge status: Home / Self Care | Payer: No Typology Code available for payment source | Attending: Emergency Medicine | Admitting: Emergency Medicine

## 2017-03-04 ENCOUNTER — Encounter (HOSPITAL_COMMUNITY): Payer: Self-pay | Admitting: Emergency Medicine

## 2017-03-04 ENCOUNTER — Emergency Department (HOSPITAL_COMMUNITY): Payer: No Typology Code available for payment source

## 2017-03-04 DIAGNOSIS — Y939 Activity, unspecified: Secondary | ICD-10-CM | POA: Diagnosis not present

## 2017-03-04 DIAGNOSIS — Z79899 Other long term (current) drug therapy: Secondary | ICD-10-CM | POA: Diagnosis not present

## 2017-03-04 DIAGNOSIS — Y92410 Unspecified street and highway as the place of occurrence of the external cause: Secondary | ICD-10-CM | POA: Insufficient documentation

## 2017-03-04 DIAGNOSIS — F17210 Nicotine dependence, cigarettes, uncomplicated: Secondary | ICD-10-CM | POA: Diagnosis not present

## 2017-03-04 DIAGNOSIS — R1031 Right lower quadrant pain: Secondary | ICD-10-CM | POA: Insufficient documentation

## 2017-03-04 DIAGNOSIS — J45909 Unspecified asthma, uncomplicated: Secondary | ICD-10-CM | POA: Insufficient documentation

## 2017-03-04 DIAGNOSIS — Y999 Unspecified external cause status: Secondary | ICD-10-CM | POA: Diagnosis not present

## 2017-03-04 DIAGNOSIS — S3991XA Unspecified injury of abdomen, initial encounter: Secondary | ICD-10-CM | POA: Diagnosis present

## 2017-03-04 LAB — I-STAT BETA HCG BLOOD, ED (MC, WL, AP ONLY)

## 2017-03-04 LAB — POC URINE PREG, ED: PREG TEST UR: NEGATIVE

## 2017-03-04 MED ORDER — KETOROLAC TROMETHAMINE 30 MG/ML IJ SOLN
15.0000 mg | Freq: Once | INTRAMUSCULAR | Status: AC
  Administered 2017-03-04: 15 mg via INTRAMUSCULAR
  Filled 2017-03-04: qty 1
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
## 2017-04-16 IMAGING — US US ABDOMEN LIMITED
1 series · 14 of 25 positions shown · non-contrast
Comparison: Ultrasound right upper quadrant October 06, 2015

CLINICAL DATA: Upper abdominal pain.

EXAM:
US ABDOMEN LIMITED - RIGHT UPPER QUADRANT

[Series 1: us abdomen limited · 0.14mm/px · 14 of 89 slices shown]
[im 1/89]
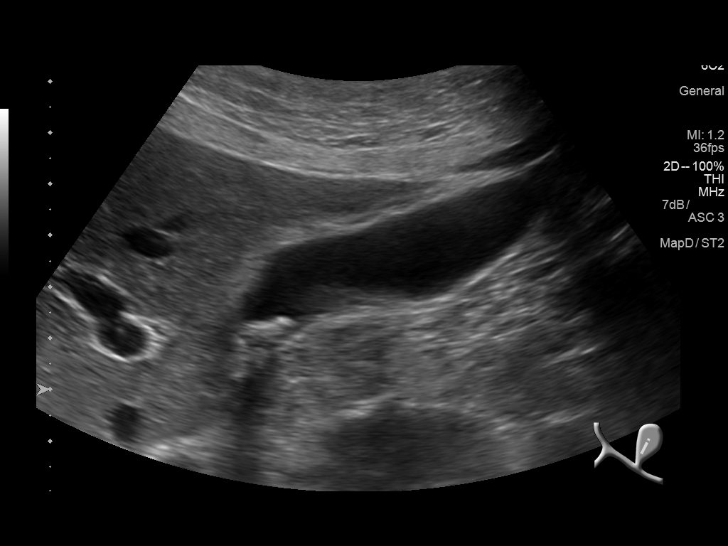
[im 8/89]
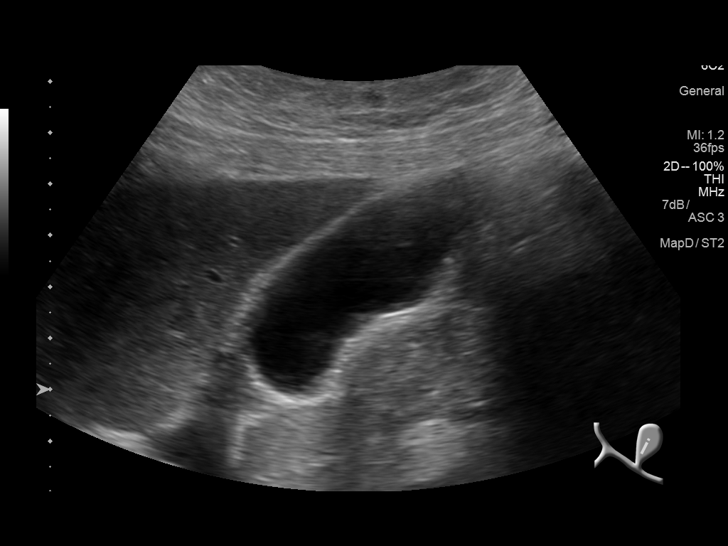
[im 15/89]
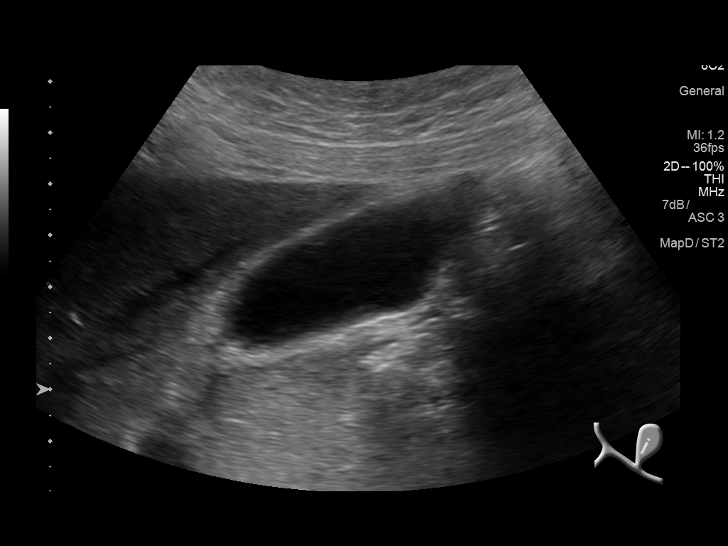
[im 23/89]
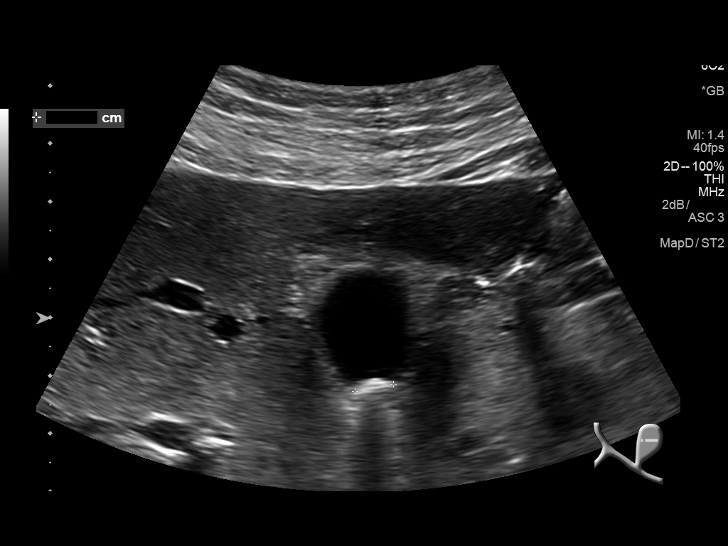
[im 30/89]
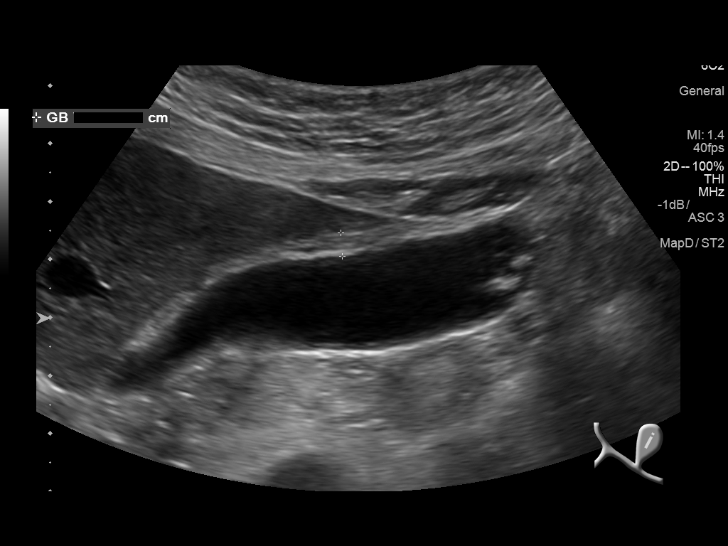
[im 34/89]
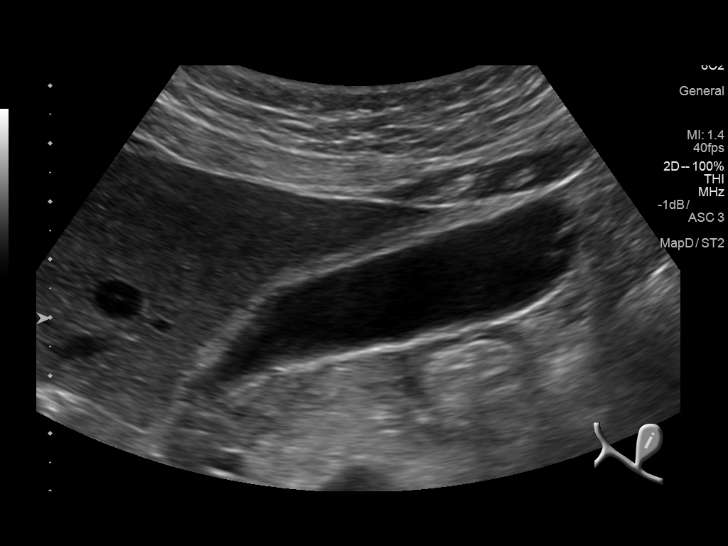
[im 41/89]
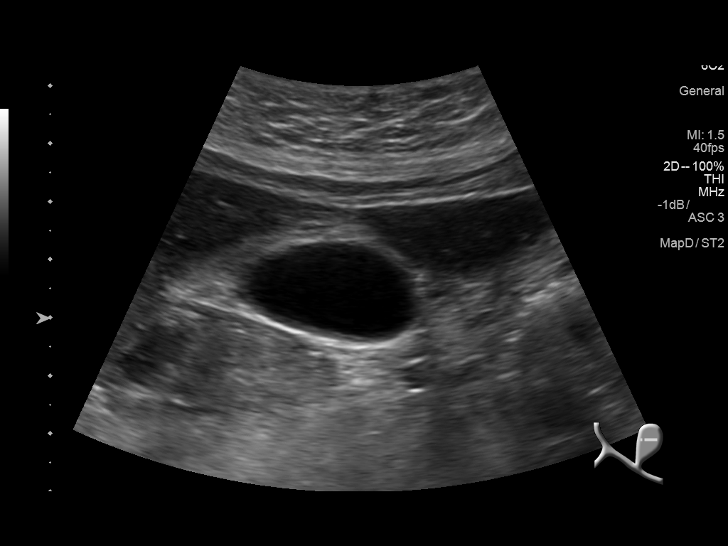
[im 48/89]
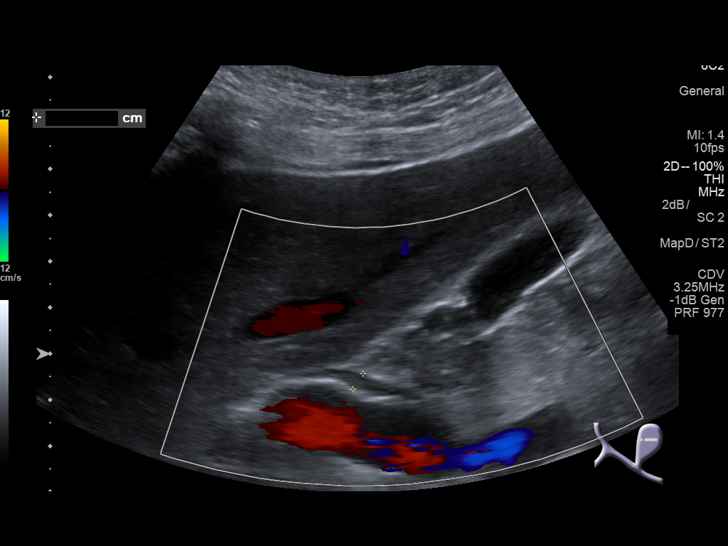
[im 56/89]
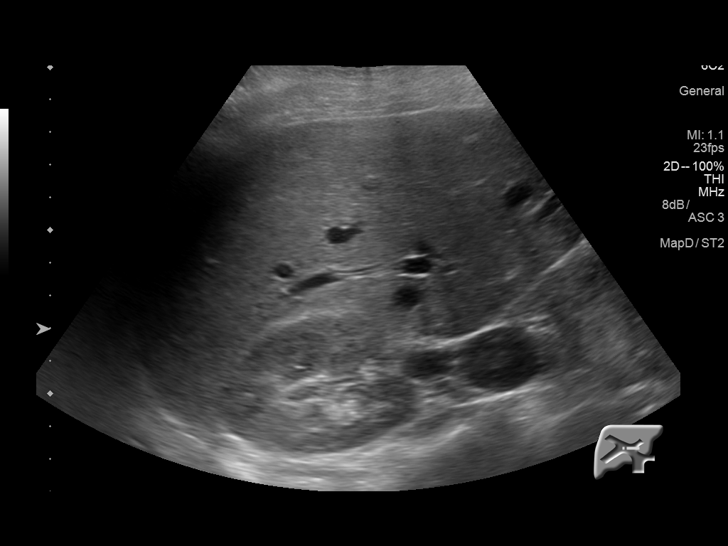
[im 59/89]
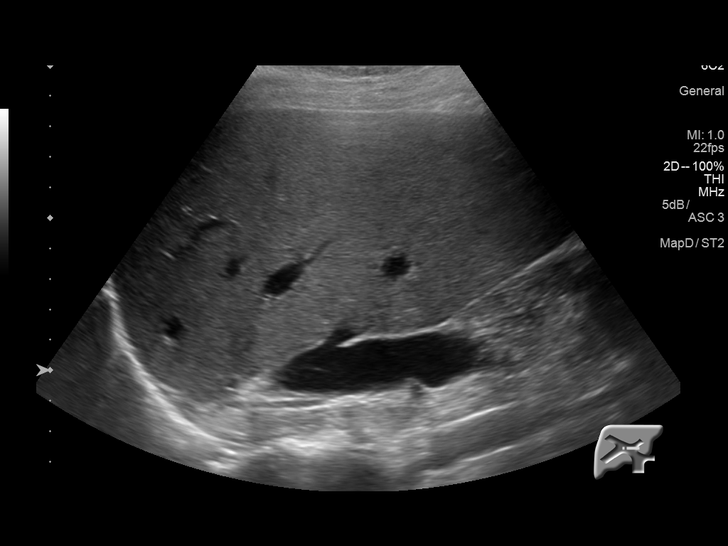
[im 67/89]
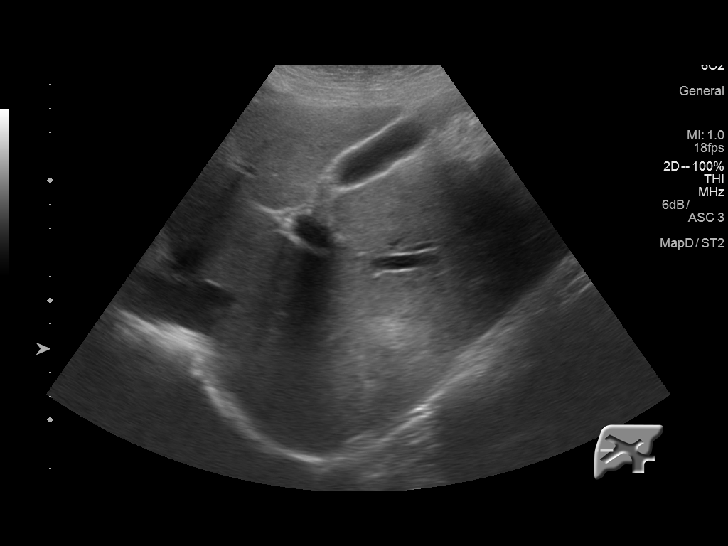
[im 74/89]
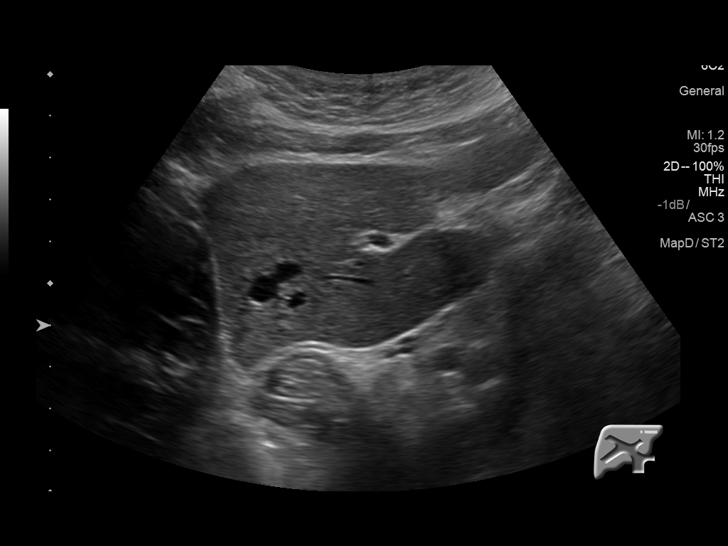
[im 81/89]
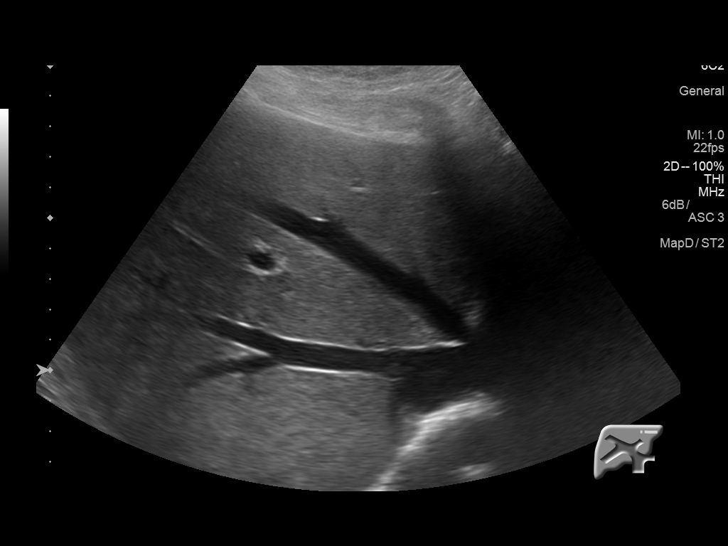
[im 89/89]
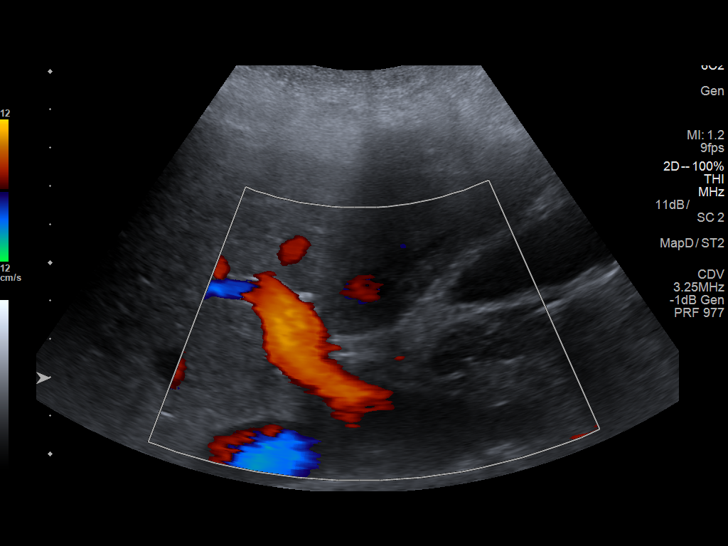

[14 of 25 positions shown; findings below may reference images not displayed]

FINDINGS: Gallbladder:

Within the gallbladder, there are echogenic foci which move and
shadow consistent with gallstones. Largest gallstone measures 7 mm
in length. The gallbladder wall is slightly thickened and subtly
edematous. No pericholecystic fluid. No sonographic Murphy sign
noted by sonographer.

Common bile duct:

Diameter: 4 mm. There is no intrahepatic or extrahepatic biliary
duct dilatation.

Liver:

No focal lesion identified. Within normal limits in parenchymal
echogenicity.
IMPRESSION: Cholelithiasis. Gallbladder wall appears slightly thickened and
subtly edematous. These findings may be indicative of early acute
cholecystitis. No pericholecystic fluid, however. Study otherwise
unremarkable.

## 2017-04-18 ENCOUNTER — Encounter (HOSPITAL_COMMUNITY): Payer: Self-pay | Admitting: Emergency Medicine

## 2017-04-18 ENCOUNTER — Emergency Department (HOSPITAL_COMMUNITY): Payer: Medicaid Other

## 2017-04-18 ENCOUNTER — Emergency Department (HOSPITAL_COMMUNITY)
Admission: EM | Admit: 2017-04-18 | Discharge: 2017-04-18 | Disposition: A | Discharge status: Home / Self Care | Payer: Medicaid Other | Attending: Emergency Medicine | Admitting: Emergency Medicine

## 2017-04-18 DIAGNOSIS — Z79899 Other long term (current) drug therapy: Secondary | ICD-10-CM | POA: Diagnosis not present

## 2017-04-18 DIAGNOSIS — J45909 Unspecified asthma, uncomplicated: Secondary | ICD-10-CM | POA: Insufficient documentation

## 2017-04-18 DIAGNOSIS — F17210 Nicotine dependence, cigarettes, uncomplicated: Secondary | ICD-10-CM | POA: Insufficient documentation

## 2017-04-18 DIAGNOSIS — R1084 Generalized abdominal pain: Secondary | ICD-10-CM | POA: Insufficient documentation

## 2017-04-18 DIAGNOSIS — R109 Unspecified abdominal pain: Secondary | ICD-10-CM

## 2017-04-18 LAB — URINALYSIS, ROUTINE W REFLEX MICROSCOPIC
Bilirubin Urine: NEGATIVE
GLUCOSE, UA: NEGATIVE mg/dL
Hgb urine dipstick: NEGATIVE
Ketones, ur: NEGATIVE mg/dL
LEUKOCYTES UA: NEGATIVE
Nitrite: NEGATIVE
PROTEIN: NEGATIVE mg/dL
SPECIFIC GRAVITY, URINE: 1.024 (ref 1.005–1.030)
pH: 7 (ref 5.0–8.0)

## 2017-04-18 LAB — PREGNANCY, URINE: PREG TEST UR: NEGATIVE

## 2017-04-18 MED ORDER — ONDANSETRON 4 MG PO TBDP: 4.0000 mg | ORAL_TABLET | Freq: Three times a day (TID) | ORAL | 0 refills | Status: DC | PRN

## 2017-04-18 MED ORDER — KETOROLAC TROMETHAMINE 60 MG/2ML IM SOLN
60.0000 mg | Freq: Once | INTRAMUSCULAR | Status: AC
  Administered 2017-04-18: 60 mg via INTRAMUSCULAR
  Filled 2017-04-18: qty 2

## 2017-04-18 MED ORDER — METHOCARBAMOL 500 MG PO TABS: 1000.0000 mg | ORAL_TABLET | Freq: Four times a day (QID) | ORAL | 0 refills | Status: DC | PRN

## 2017-04-18 MED ORDER — NAPROXEN 250 MG PO TABS
250.0000 mg | ORAL_TABLET | Freq: Two times a day (BID) | ORAL | 0 refills | Status: DC | PRN
Start: 2017-04-18 — End: 2017-12-21

## 2017-04-18 NOTE — ED Triage Notes (Signed)
Patient complaining of bilateral flank pain x 1 month. Denies dysuria. Denies injury.

## 2017-04-18 NOTE — Discharge Instructions (Signed)
Take the prescriptions as directed.  Also take over the counter tylenol, as directed on packaging, as needed for discomfort. Apply moist heat or ice to the area(s) of discomfort, for 15 minutes at a time, several times per day for the next few days.  Do not fall asleep on a heating or ice pack.  Call your regular medical doctor tomorrow to schedule a follow up appointment this week.  Return to the Emergency Department immediately if worsening. ° °

## 2017-04-18 NOTE — ED Notes (Signed)
Nurse first rounding-pt updated on care and wait time, VSS.
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
## 2017-04-18 NOTE — ED Notes (Signed)
Pt made aware to return if symptoms worsen or if any life threatening symptoms occur.   

## 2017-06-23 ENCOUNTER — Encounter (HOSPITAL_COMMUNITY): Payer: Self-pay | Admitting: Emergency Medicine

## 2017-06-23 ENCOUNTER — Emergency Department (HOSPITAL_COMMUNITY): Payer: Medicaid Other

## 2017-06-23 ENCOUNTER — Emergency Department (HOSPITAL_COMMUNITY)
Admission: EM | Admit: 2017-06-23 | Discharge: 2017-06-23 | Disposition: A | Discharge status: Home / Self Care | Payer: Medicaid Other | Attending: Emergency Medicine | Admitting: Emergency Medicine

## 2017-06-23 DIAGNOSIS — R0981 Nasal congestion: Secondary | ICD-10-CM | POA: Insufficient documentation

## 2017-06-23 DIAGNOSIS — J029 Acute pharyngitis, unspecified: Secondary | ICD-10-CM | POA: Insufficient documentation

## 2017-06-23 DIAGNOSIS — J40 Bronchitis, not specified as acute or chronic: Secondary | ICD-10-CM | POA: Diagnosis not present

## 2017-06-23 DIAGNOSIS — F17210 Nicotine dependence, cigarettes, uncomplicated: Secondary | ICD-10-CM | POA: Diagnosis not present

## 2017-06-23 DIAGNOSIS — Z91040 Latex allergy status: Secondary | ICD-10-CM | POA: Diagnosis not present

## 2017-06-23 DIAGNOSIS — R05 Cough: Secondary | ICD-10-CM | POA: Diagnosis present

## 2017-06-23 DIAGNOSIS — J45909 Unspecified asthma, uncomplicated: Secondary | ICD-10-CM | POA: Diagnosis not present

## 2017-06-23 DIAGNOSIS — Z79899 Other long term (current) drug therapy: Secondary | ICD-10-CM | POA: Diagnosis not present

## 2017-06-23 DIAGNOSIS — R6883 Chills (without fever): Secondary | ICD-10-CM | POA: Insufficient documentation

## 2017-06-23 LAB — RAPID STREP SCREEN (MED CTR MEBANE ONLY): Streptococcus, Group A Screen (Direct): NEGATIVE

## 2017-06-23 MED ORDER — AZITHROMYCIN 250 MG PO TABS: ORAL_TABLET | ORAL | 0 refills | Status: DC

## 2017-06-23 MED ORDER — PREDNISONE 20 MG PO TABS: 40.0000 mg | ORAL_TABLET | Freq: Every day | ORAL | 0 refills | Status: DC

## 2017-06-23 MED ORDER — PREDNISONE 50 MG PO TABS
60.0000 mg | ORAL_TABLET | Freq: Once | ORAL | Status: AC
  Administered 2017-06-23: 60 mg via ORAL
  Filled 2017-06-23: qty 1

## 2017-06-23 NOTE — Discharge Instructions (Signed)
Tylenol or ibuprofen every 4-6 hours as needed for fever. Plenty of fluids. Usually her albuterol inhaler 2 puffs 4 times a day as needed. Start prednisone tomorrow. Follow-up with primary doctor for recheck or return here for any worsening symptoms

## 2017-06-23 NOTE — ED Triage Notes (Signed)
Pt c/o cough, congestion, sore throat, and chills since yesterday.

## 2017-06-24 NOTE — ED Provider Notes (Signed)
[facility]-EMERG[facility]Y DEPT Provider Note   CSN: [id number] Arrival date & time: 06/23/17  2004     History   Chief Complaint Chief Complaint  Patient presents with  . Cough    HPI Karen Shields is a 26 y.o. female.  HPI   Karen Shields is a 26 y.o. female who presents to the Emergency Department complaining of cough, sore throat, nasal congestion and chills.  Symptoms present for one day.  Husband also has similar symptoms and is also being seen here.  Cough occasionally productive.  She has taken OTC cold and cough relievers without improvement.  No known fever.  She denies shortness of breath, vomiting, chest or abdominal pain.    Past Medical History:  Diagnosis Date  . Anxiety   . Asthma    WELL CONTROLLED  . Asthma   . Eczema 2008  . Gallstones   . GERD (gastroesophageal reflux disease)   . Headache   . Hx MRSA infection 09-2014  . Ovarian cyst     Patient Active Problem List   Diagnosis Date Noted  . Gallstone   . RUQ pain 12/25/2015  . Gallstones   . Reflux esophagitis   . Nausea without vomiting 11/04/2015  . Cholelithiases 11/04/2015  . GERD (gastroesophageal reflux disease) 11/04/2015  . Early satiety 11/04/2015  . MRSA (methicillin resistant staph aureus) culture positive 12/11/2014  . Menstrual migraine without status migrainosus, not intractable 07/10/2014  . Anxiety 06/14/2013  . Maternal substance abuse 04/14/2013  . H/O cold sores 04/12/2013  . Ovarian cyst, left 02/09/2011    Past Surgical History:  Procedure Laterality Date  . CHOLECYSTECTOMY N/A 01/16/2016   Procedure: LAPAROSCOPIC CHOLECYSTECTOMY;  Surgeon: Lattie Haw, MD;  Location: [facility] ORS;  Service: General;  Laterality: N/A;  . ESOPHAGOGASTRODUODENOSCOPY N/A 11/13/2015   JYN:WGNFAOZ reflux   . OVARIAN CYST REMOVAL    . OVARIAN CYST REMOVAL  2011  . TUBAL LIGATION    . WISDOM TOOTH EXTRACTION      OB History    Gravida Para Term Preterm AB Living   0 0 2   SAB TAB  Ectopic Multiple Live Births   0 0 0 0 1       Home Medications    Prior to Admission medications   Medication Sig Start Date End Date Taking? Authorizing Provider  albuterol (PROVENTIL HFA;VENTOLIN HFA) 108 (90 BASE) MCG/ACT inhaler Inhale 2 puffs into the lungs every 6 (six) hours as needed for wheezing. 11/08/14   Campbell Riches, NP  ALPRAZolam Prudy Feeler) 1 MG tablet TAKE 1/2 TO 1 TABLET BY MOUTH TWICE DAILY AS NEEDED FOR ANXIETY. 03/10/16   Merlyn Albert, MD  amphetamine-dextroamphetamine (ADDERALL) 20 MG tablet Take 20 mg by mouth daily as needed.    [provider]  aspirin-acetaminophen-caffeine (EXCEDRIN MIGRAINE) [phone] MG tablet Take 1 tablet by mouth every 6 (six) hours as needed for headache.    [provider]  azithromycin (ZITHROMAX) 250 MG tablet Take first 2 tablets together, then 1 every day until finished. 06/23/17   Duc Crocket, PA-C  Cholecalciferol (VITAMIN D3) 5000 units CAPS Take 1 capsule by mouth daily.    [provider]  dexlansoprazole (DEXILANT) 60 MG capsule Take 60 mg by mouth every morning. Reported on 03/17/2016    [provider]  dimenhyDRINATE (DRAMAMINE) 50 MG tablet Take 50 mg by mouth every 8 (eight) hours as needed for nausea.    [provider]  methocarbamol (  ROBAXIN) 500 MG tablet Take 2 tablets (1,000 mg total) by mouth 4 (four) times daily as needed for muscle spasms (muscle spasm/pain). 04/18/17   Samuel Jester, DO  naproxen (NAPROSYN) 250 MG tablet Take 1 tablet (250 mg total) by mouth 2 (two) times daily as needed for mild pain or moderate pain (take with food). 04/18/17   Samuel Jester, DO  ondansetron (ZOFRAN ODT) 4 MG disintegrating tablet Take 1 tablet (4 mg total) by mouth every 8 (eight) hours as needed for nausea or vomiting. 04/18/17   Samuel Jester, DO  predniSONE (DELTASONE) 20 MG tablet Take 2 tablets (40 mg total) by mouth daily. For 5 days 06/23/17   Alvin Diffee, PA-C    vortioxetine HBr (TRINTELLIX) 5 MG TABS Take 5 mg by mouth daily.    [provider]    Family History Family History  Problem Relation Age of Onset  . Diabetes Mother   . Colon cancer Neg Hx   . Crohn's disease Neg Hx   . Celiac disease Neg Hx     Social History Social History  Substance Use Topics  . Smoking status: Current Every Day Smoker    Packs/day: 1.00    Years: 5.00    Types: Cigarettes    Start date: 02/11/2006  . Smokeless tobacco: Never Used  . Alcohol use 0.0 oz/week     Comment: occassionally     Allergies   Latex   Review of Systems Review of Systems  Constitutional: Positive for chills. Negative for activity change, appetite change and fever.  HENT: Positive for congestion, rhinorrhea and sore throat. Negative for facial swelling and trouble swallowing.   Eyes: Negative for visual disturbance.  Respiratory: Positive for cough. Negative for chest tightness, shortness of breath, wheezing and stridor.   Cardiovascular: Negative for chest pain.  Gastrointestinal: Negative for abdominal pain, nausea and vomiting.  Genitourinary: Negative for dysuria and flank pain.  Musculoskeletal: Negative for neck pain and neck stiffness.  Skin: Negative.  Negative for rash.  Neurological: Negative for dizziness, weakness, numbness and headaches.  Hematological: Negative for adenopathy.  Psychiatric/Behavioral: Negative for confusion.  All other systems reviewed and are negative.    Physical Exam Updated Vital Signs BP 127/75 (BP Location: Right Arm)   Pulse 81   Temp 98.4 F (36.9 C) (Oral)   Resp 18   Ht  (1.626 m)   Wt 78 kg (172 lb)   LMP 06/22/2017   SpO2 97%   BMI 29.52 kg/m   Physical Exam  Constitutional: She is oriented to person, place, and time. She appears well-developed and well-nourished. No distress.  HENT:  Head: Atraumatic.  Mouth/Throat: Uvula is midline and mucous membranes are normal. Posterior oropharyngeal erythema  present. No oropharyngeal exudate or posterior oropharyngeal edema. No tonsillar exudate.  Neck: Normal range of motion.  Cardiovascular: Normal rate, regular rhythm and intact distal pulses.   Pulmonary/Chest: Effort normal. No respiratory distress. She has no wheezes.  Coarse lung sounds bilaterally.  No rales or wheezing.   Abdominal: Soft. She exhibits no distension and no mass. There is no tenderness. There is no guarding.  Musculoskeletal: Normal range of motion.  Lymphadenopathy:    She has no cervical adenopathy.  Neurological: She is alert and oriented to person, place, and time. No sensory deficit.  Skin: Skin is warm. Capillary refill takes less than 2 seconds. No rash noted.  Psychiatric: She has a normal mood and affect.  Nursing note and vitals reviewed.  ED Treatments / Results  Labs (all labs ordered are listed, but only abnormal results are displayed) Labs Reviewed  RAPID STREP SCREEN (NOT AT [facility])  CULTURE, GROUP A STREP [facility])    EKG  EKG Interpretation None       Radiology Dg Chest 2 View  Result Date: 06/23/2017 CLINICAL DATA:  Cough EXAM: CHEST  2 VIEW COMPARISON:  03/04/2017 FINDINGS: No consolidation or effusion. Mild bronchial thickening. Normal heart size. No pneumothorax. Surgical clips in the upper abdomen IMPRESSION: 1. Bronchial thickening suggesting bronchial inflammation 2. No focal infiltrate Electronically Signed   By: Jasmine Pang M.D.   On: 06/23/2017 20:34    Procedures Procedures (including critical care time)  Medications Ordered in ED Medications  predniSONE (DELTASONE) tablet 60 mg (60 mg Oral Given 06/23/17 2216)     Initial Impression / Assessment and Plan / ED Course  I have reviewed the triage vital signs and the nursing notes.  Pertinent labs & imaging results that were available during my care of the patient were reviewed by me and considered in my medical decision making (see chart for details).     Pt well  appearing.  Using an albuterol inhaler at home.  Non-toxic appearing. Likely bronchitis.  Appears stable for d/c.  Return precautions discussed.   Final Clinical Impressions(s) / ED Diagnoses   Final diagnoses:  Bronchitis    New Prescriptions Discharge Medication List as of 06/23/2017  9:40 PM    START taking these medications   Details  azithromycin (ZITHROMAX) 250 MG tablet Take first 2 tablets together, then 1 every day until finished., Print    predniSONE (DELTASONE) 20 MG tablet Take 2 tablets (40 mg total) by mouth daily. For 5 days, Starting Thu 06/23/2017, [employer], [place], PA-C 06/24/17 2336    Jacalyn Lefevre, MD 06/29/17 1530

## 2017-06-26 LAB — CULTURE, GROUP A STREP (THRC)

## 2017-07-31 ENCOUNTER — Encounter (HOSPITAL_COMMUNITY): Payer: Self-pay | Admitting: *Deleted

## 2017-07-31 ENCOUNTER — Other Ambulatory Visit: Payer: Self-pay

## 2017-07-31 DIAGNOSIS — J45909 Unspecified asthma, uncomplicated: Secondary | ICD-10-CM | POA: Insufficient documentation

## 2017-07-31 DIAGNOSIS — L509 Urticaria, unspecified: Secondary | ICD-10-CM | POA: Diagnosis not present

## 2017-07-31 DIAGNOSIS — Z79899 Other long term (current) drug therapy: Secondary | ICD-10-CM | POA: Insufficient documentation

## 2017-07-31 DIAGNOSIS — F17210 Nicotine dependence, cigarettes, uncomplicated: Secondary | ICD-10-CM | POA: Diagnosis not present

## 2017-07-31 DIAGNOSIS — L299 Pruritus, unspecified: Secondary | ICD-10-CM | POA: Diagnosis present

## 2017-07-31 NOTE — ED Triage Notes (Signed)
Pt c/o hives, itching since last night, did take a benadryl during the night and symptoms improved but became worse after taking a shower tonight.

## 2017-08-01 ENCOUNTER — Emergency Department (HOSPITAL_COMMUNITY)
Admission: EM | Admit: 2017-08-01 | Discharge: 2017-08-01 | Disposition: A | Discharge status: Home / Self Care | Payer: Medicaid Other | Attending: Emergency Medicine | Admitting: Emergency Medicine

## 2017-08-01 DIAGNOSIS — L509 Urticaria, unspecified: Secondary | ICD-10-CM

## 2017-08-01 MED ORDER — FEXOFENADINE HCL 60 MG PO TABS: 60.0000 mg | ORAL_TABLET | Freq: Every day | ORAL | 0 refills | Status: DC

## 2017-08-01 MED ORDER — RANITIDINE HCL 150 MG PO TABS: 150.0000 mg | ORAL_TABLET | Freq: Every day | ORAL | 0 refills | Status: DC

## 2017-08-01 NOTE — Discharge Instructions (Signed)
Take both allegra and zantac daily until symptoms have resolved.  Try to not itch, as this will cause worsening symptoms.  Follow up with your primary care doctor this week for further evaluation of your symptoms.  Return to the ER if you develop fevers, chills, swelling of your throat or tongue, or any new or concerning symptoms.

## 2017-08-01 NOTE — ED Notes (Signed)
ED Provider at bedside. 

## 2017-08-01 NOTE — ED Notes (Signed)
No answer in waiting room X1, registration advised that pt walked outside,

## 2017-08-01 NOTE — ED Provider Notes (Signed)
[facility] EMERGENCY DEPARTMENT Provider Note   CSN: [id number] Arrival date & time: 07/31/17  2237     History   Chief Complaint Chief Complaint  Patient presents with  . Allergic Reaction    HPI Karen Shields is a 26 y.o. female presenting with hives.  Patient states that she was on her sister's couch yesterday when she started to have itching of her posterior thighs and butt.  States she always has sensitive skin, but does not normally have a reaction to her sister's home.  Sister has cats at home.  Patient took Benadryl last night, which improved the itching.  While patient was in a hot shower this evening, she noticed worsening welts along her legs, buttocks, and breasts.  This is what prompted her to come to the emergency room.  She reports minimal itching.  She denies history of similar in the past.  She has not taken anything for itching or swelling today.  She denies new detergents, soaps, shampoos, conditioners, clothes, or other new environments besides her sister's house.  No one else at home has similar.  She denies difficulty breathing, tongue, lip, or throat swelling, or nausea or vomiting.  HPI  Past Medical History:  Diagnosis Date  . Anxiety   . Asthma    WELL CONTROLLED  . Asthma   . Eczema 2008  . Gallstones   . GERD (gastroesophageal reflux disease)   . Headache   . Hx MRSA infection 09-2014  . Ovarian cyst     Patient Active Problem List   Diagnosis Date Noted  . Gallstone   . RUQ pain 12/25/2015  . Gallstones   . Reflux esophagitis   . Nausea without vomiting 11/04/2015  . Cholelithiases 11/04/2015  . GERD (gastroesophageal reflux disease) 11/04/2015  . Early satiety 11/04/2015  . MRSA (methicillin resistant staph aureus) culture positive 12/11/2014  . Menstrual migraine without status migrainosus, not intractable 07/10/2014  . Anxiety 06/14/2013  . Maternal substance abuse 04/14/2013  . H/O cold sores 04/12/2013  . Ovarian cyst, left  02/09/2011    Past Surgical History:  Procedure Laterality Date  . OVARIAN CYST REMOVAL    . OVARIAN CYST REMOVAL  2011  . TUBAL LIGATION    . WISDOM TOOTH EXTRACTION      OB History    Gravida Para Term Preterm AB Living   2 2 1  0 0 2   SAB TAB Ectopic Multiple Live Births   0 0 0 0 1       Home Medications    Prior to Admission medications   Medication Sig Start Date End Date Taking? Authorizing Provider  albuterol (PROVENTIL HFA;VENTOLIN HFA) 108 (90 BASE) MCG/ACT inhaler Inhale 2 puffs into the lungs every 6 (six) hours as needed for wheezing. 11/08/14   Campbell RichesHoskins, Carolyn C, NP  ALPRAZolam Prudy Feeler(XANAX) 1 MG tablet TAKE 1/2 TO 1 TABLET BY MOUTH TWICE DAILY AS NEEDED FOR ANXIETY. 03/10/16   Merlyn AlbertLuking, William S, MD  amphetamine-dextroamphetamine (ADDERALL) 20 MG tablet Take 20 mg by mouth daily as needed.    [provider]  aspirin-acetaminophen-caffeine (EXCEDRIN MIGRAINE) [phone] MG tablet Take 1 tablet by mouth every 6 (six) hours as needed for headache.    [provider]  azithromycin (ZITHROMAX) 250 MG tablet Take first 2 tablets together, then 1 every day until finished. 06/23/17   Triplett, Tammy, PA-C  Cholecalciferol (VITAMIN D3) 5000 units CAPS Take 1 capsule by mouth daily.    [provider]  dexlansoprazole (DEXILANT) 60 MG capsule Take 60 mg by mouth every morning. Reported on 03/17/2016    [provider]  dimenhyDRINATE (DRAMAMINE) 50 MG tablet Take 50 mg by mouth every 8 (eight) hours as needed for nausea.    [provider]  fexofenadine (ALLEGRA) 60 MG tablet Take 1 tablet (60 mg total) daily by mouth. 08/01/17   Thorsten Climer, PA-C  methocarbamol (ROBAXIN) 500 MG tablet Take 2 tablets (1,000 mg total) by mouth 4 (four) times daily as needed for muscle spasms (muscle spasm/pain). 04/18/17   Samuel Jester, DO  naproxen (NAPROSYN) 250 MG tablet Take 1 tablet (250 mg total) by mouth 2 (two) times daily as needed for mild  pain or moderate pain (take with food). 04/18/17   Samuel Jester, DO  ondansetron (ZOFRAN ODT) 4 MG disintegrating tablet Take 1 tablet (4 mg total) by mouth every 8 (eight) hours as needed for nausea or vomiting. 04/18/17   Samuel Jester, DO  predniSONE (DELTASONE) 20 MG tablet Take 2 tablets (40 mg total) by mouth daily. For 5 days 06/23/17   Pauline Aus, PA-C  ranitidine (ZANTAC) 150 MG tablet Take 1 tablet (150 mg total) daily by mouth. 08/01/17   Jaeli Grubb, PA-C  vortioxetine HBr (TRINTELLIX) 5 MG TABS Take 5 mg by mouth daily.    [provider]    Family History Family History  Problem Relation Age of Onset  . Diabetes Mother   . Colon cancer Neg Hx   . Crohn's disease Neg Hx   . Celiac disease Neg Hx     Social History Social History   Tobacco Use  . Smoking status: Current Every Day Smoker    Packs/day: 1.00    Years: 5.00    Pack years: 5.00    Types: Cigarettes    Start date: 02/11/2006  . Smokeless tobacco: Never Used  Substance Use Topics  . Alcohol use: Yes    Alcohol/week: 0.0 oz    Comment: occassionally  . Drug use: No     Allergies   Latex   Review of Systems Review of Systems  Constitutional: Negative for chills and fever.  HENT: Negative for facial swelling and trouble swallowing.   Respiratory: Negative for cough, chest tightness and shortness of breath.   Cardiovascular: Negative for chest pain.  Gastrointestinal: Negative for nausea and vomiting.  Skin: Positive for rash.     Physical Exam Updated Vital Signs BP (!) 126/95   Pulse 84   Temp 98.5 F (36.9 C) (Oral)   Resp 20   Ht 5\' 3"  (1.6 m)   Wt 77.1 kg (170 lb)   LMP 07/24/2017   SpO2 100%   BMI 30.11 kg/m   Physical Exam  Constitutional: She is oriented to person, place, and time. She appears well-developed and well-nourished. No distress.  HENT:  Head: Normocephalic and atraumatic.  No obvious swelling of the lips, tongue, or throat.  Patient  handling secretions easily.  Eyes: EOM are normal.  Neck: Normal range of motion.  Cardiovascular: Normal rate, regular rhythm and intact distal pulses.  Pulmonary/Chest: Effort normal and breath sounds normal. No respiratory distress. She has no wheezes.  No difficulty breathing or respiratory distress.  Abdominal: She exhibits no distension.  Musculoskeletal: Normal range of motion.  Neurological: She is alert and oriented to person, place, and time.  Skin: Skin is warm. Rash noted.  Urticaria along bilateral upper legs and buttocks.  Patient with urticaria of bilateral breasts.  No urticarial  lesions noted on abdomen, back, or arms.  Psychiatric: She has a normal mood and affect.  Nursing note and vitals reviewed.    ED Treatments / Results  Labs (all labs ordered are listed, but only abnormal results are displayed) Labs Reviewed - No data to display  EKG  EKG Interpretation None       Radiology No results found.  Procedures Procedures (including critical care time)  Medications Ordered in ED Medications - No data to display   Initial Impression / Assessment and Plan / ED Course  I have reviewed the triage vital signs and the nursing notes.  Pertinent labs & imaging results that were available during my care of the patient were reviewed by me and considered in my medical decision making (see chart for details).     Patient presenting with urticaria.  No sign of anaphylaxis.  Will treat with H1 and H2 blocker and have patient follow-up with primary care.  Discussed findings and plan with patient.  Discussed importance of not scratching.  At this time, patient appears safe for discharge.  Return precautions given.  Patient states she understands and agrees to plan.   Final Clinical Impressions(s) / ED Diagnoses   Final diagnoses:  Urticaria    ED Discharge Orders        Ordered    fexofenadine (ALLEGRA) 60 MG tablet  Daily     08/01/17 0038    ranitidine  (ZANTAC) 150 MG tablet  Daily     08/01/17 0038       Alveria ApleyCaccavale, Raevon Broom, PA-C 08/01/17 0134    Glynn Octaveancour, Stephen, MD 08/01/17 [phone]

## 2017-11-05 ENCOUNTER — Other Ambulatory Visit: Payer: Self-pay

## 2017-11-05 ENCOUNTER — Emergency Department (HOSPITAL_COMMUNITY)
Admission: EM | Admit: 2017-11-05 | Discharge: 2017-11-05 | Disposition: A | Discharge status: Home / Self Care | Payer: Medicaid Other | Attending: Emergency Medicine | Admitting: Emergency Medicine

## 2017-11-05 ENCOUNTER — Encounter (HOSPITAL_COMMUNITY): Payer: Self-pay | Admitting: Emergency Medicine

## 2017-11-05 DIAGNOSIS — J45909 Unspecified asthma, uncomplicated: Secondary | ICD-10-CM | POA: Insufficient documentation

## 2017-11-05 DIAGNOSIS — Y939 Activity, unspecified: Secondary | ICD-10-CM | POA: Insufficient documentation

## 2017-11-05 DIAGNOSIS — Y92410 Unspecified street and highway as the place of occurrence of the external cause: Secondary | ICD-10-CM | POA: Insufficient documentation

## 2017-11-05 DIAGNOSIS — S29012A Strain of muscle and tendon of back wall of thorax, initial encounter: Secondary | ICD-10-CM | POA: Insufficient documentation

## 2017-11-05 DIAGNOSIS — S4991XA Unspecified injury of right shoulder and upper arm, initial encounter: Secondary | ICD-10-CM | POA: Diagnosis present

## 2017-11-05 DIAGNOSIS — S46811A Strain of other muscles, fascia and tendons at shoulder and upper arm level, right arm, initial encounter: Secondary | ICD-10-CM

## 2017-11-05 DIAGNOSIS — Z91040 Latex allergy status: Secondary | ICD-10-CM | POA: Insufficient documentation

## 2017-11-05 DIAGNOSIS — Z79899 Other long term (current) drug therapy: Secondary | ICD-10-CM | POA: Diagnosis not present

## 2017-11-05 DIAGNOSIS — F17210 Nicotine dependence, cigarettes, uncomplicated: Secondary | ICD-10-CM | POA: Insufficient documentation

## 2017-11-05 DIAGNOSIS — Y998 Other external cause status: Secondary | ICD-10-CM | POA: Diagnosis not present

## 2017-11-05 MED ORDER — DIAZEPAM 5 MG PO TABS
10.0000 mg | ORAL_TABLET | Freq: Once | ORAL | Status: AC
  Administered 2017-11-05: 10 mg via ORAL
  Filled 2017-11-05: qty 2

## 2017-11-05 MED ORDER — TRAMADOL HCL 50 MG PO TABS
100.0000 mg | ORAL_TABLET | Freq: Once | ORAL | Status: AC
  Administered 2017-11-05: 100 mg via ORAL
  Filled 2017-11-05: qty 2

## 2017-11-05 MED ORDER — IBUPROFEN 600 MG PO TABS: 600.0000 mg | ORAL_TABLET | Freq: Four times a day (QID) | ORAL | 0 refills | Status: DC

## 2017-11-05 MED ORDER — ONDANSETRON HCL 4 MG PO TABS
4.0000 mg | ORAL_TABLET | Freq: Once | ORAL | Status: AC
  Administered 2017-11-05: 4 mg via ORAL
  Filled 2017-11-05: qty 1

## 2017-11-05 MED ORDER — ACETAMINOPHEN-CODEINE #3 300-30 MG PO TABS: 1.0000 | ORAL_TABLET | Freq: Four times a day (QID) | ORAL | 0 refills | Status: DC | PRN

## 2017-11-05 MED ORDER — CYCLOBENZAPRINE HCL 10 MG PO TABS: 10.0000 mg | ORAL_TABLET | Freq: Three times a day (TID) | ORAL | 0 refills | Status: DC

## 2017-11-05 MED ORDER — IBUPROFEN 400 MG PO TABS
400.0000 mg | ORAL_TABLET | Freq: Once | ORAL | Status: AC
  Administered 2017-11-05: 400 mg via ORAL
  Filled 2017-11-05: qty 1
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
## 2017-11-05 NOTE — Discharge Instructions (Signed)
Your neurologic and vascular exam are all within normal limits.  Your exam does show strain involving your trapezius muscle on the right.  Please use a ice pack tonight, and then use heat following this.  Please use Flexeril 3 times daily, use ibuprofen with breakfast, lunch, dinner, and at bedtime. Use Tylenol-Codeine with food every 6 hours for more severe pain. Flexeril and Tylenol-Codeine may cause drowsiness, please use with caution.  Please see Dr. Janna Archondiego for additional follow-up if not improving.

## 2017-11-17 ENCOUNTER — Emergency Department
Admission: EM | Admit: 2017-11-17 | Discharge: 2017-11-17 | Disposition: A | Discharge status: Home / Self Care | Payer: Medicaid Other | Attending: Emergency Medicine | Admitting: Emergency Medicine

## 2017-11-17 ENCOUNTER — Other Ambulatory Visit: Payer: Self-pay

## 2017-11-17 ENCOUNTER — Encounter: Payer: Self-pay | Admitting: *Deleted

## 2017-11-17 DIAGNOSIS — R05 Cough: Secondary | ICD-10-CM | POA: Diagnosis not present

## 2017-11-17 DIAGNOSIS — R197 Diarrhea, unspecified: Secondary | ICD-10-CM

## 2017-11-17 DIAGNOSIS — J45909 Unspecified asthma, uncomplicated: Secondary | ICD-10-CM | POA: Diagnosis not present

## 2017-11-17 DIAGNOSIS — R059 Cough, unspecified: Secondary | ICD-10-CM

## 2017-11-17 DIAGNOSIS — R109 Unspecified abdominal pain: Secondary | ICD-10-CM | POA: Diagnosis present

## 2017-11-17 DIAGNOSIS — R8271 Bacteriuria: Secondary | ICD-10-CM

## 2017-11-17 DIAGNOSIS — F17210 Nicotine dependence, cigarettes, uncomplicated: Secondary | ICD-10-CM | POA: Insufficient documentation

## 2017-11-17 DIAGNOSIS — R112 Nausea with vomiting, unspecified: Secondary | ICD-10-CM | POA: Insufficient documentation

## 2017-11-17 LAB — URINALYSIS, COMPLETE (UACMP) WITH MICROSCOPIC
Bilirubin Urine: NEGATIVE
GLUCOSE, UA: NEGATIVE mg/dL
HGB URINE DIPSTICK: NEGATIVE
KETONES UR: NEGATIVE mg/dL
Leukocytes, UA: NEGATIVE
Nitrite: NEGATIVE
PH: 7 (ref 5.0–8.0)
PROTEIN: NEGATIVE mg/dL
Specific Gravity, Urine: 1.013 (ref 1.005–1.030)

## 2017-11-17 LAB — COMPREHENSIVE METABOLIC PANEL
ALK PHOS: 92 U/L (ref 38–126)
ALT: 18 U/L (ref 14–54)
ANION GAP: 9 (ref 5–15)
AST: 24 U/L (ref 15–41)
Albumin: 4.4 g/dL (ref 3.5–5.0)
BUN: 7 mg/dL (ref 6–20)
CALCIUM: 9.7 mg/dL (ref 8.9–10.3)
CO2: 27 mmol/L (ref 22–32)
CREATININE: 0.71 mg/dL (ref 0.44–1.00)
Chloride: 106 mmol/L (ref 101–111)
GFR calc non Af Amer: >60 mL/min (ref >60)
Glucose, Bld: 101 mg/dL — ABNORMAL HIGH (ref 65–99)
Potassium: 3.5 mmol/L (ref 3.5–5.1)
SODIUM: 142 mmol/L (ref 135–145)
TOTAL PROTEIN: 7.7 g/dL (ref 6.5–8.1)
Total Bilirubin: 0.5 mg/dL (ref 0.3–1.2)

## 2017-11-17 LAB — CBC
HCT: 43.5 % (ref 35.0–47.0)
HEMOGLOBIN: 14.7 g/dL (ref 12.0–16.0)
MCH: 29.5 pg (ref 26.0–34.0)
MCHC: 33.9 g/dL (ref 32.0–36.0)
MCV: 87.2 fL (ref 80.0–100.0)
PLATELETS: 358 10*3/uL (ref 150–440)
RBC: 4.98 MIL/uL (ref 3.80–5.20)
RDW: 13 % (ref 11.5–14.5)
WBC: 9.6 10*3/uL (ref 3.6–11.0)

## 2017-11-17 LAB — INFLUENZA PANEL BY PCR (TYPE A & B)
Influenza A By PCR: NEGATIVE
Influenza B By PCR: NEGATIVE

## 2017-11-17 LAB — POCT PREGNANCY, URINE: PREG TEST UR: NEGATIVE

## 2017-11-17 LAB — LIPASE, BLOOD: LIPASE: 28 U/L (ref 11–51)

## 2017-11-17 MED ORDER — SULFAMETHOXAZOLE-TRIMETHOPRIM 800-160 MG PO TABS: 1.0000 | ORAL_TABLET | Freq: Two times a day (BID) | ORAL | 0 refills | Status: DC

## 2017-11-17 MED ORDER — ONDANSETRON 4 MG PO TBDP
4.0000 mg | ORAL_TABLET | Freq: Once | ORAL | Status: AC
  Administered 2017-11-17: 4 mg via ORAL
  Filled 2017-11-17: qty 1

## 2017-11-17 MED ORDER — SULFAMETHOXAZOLE-TRIMETHOPRIM 800-160 MG PO TABS
1.0000 | ORAL_TABLET | Freq: Once | ORAL | Status: AC
  Administered 2017-11-17: 1 via ORAL
  Filled 2017-11-17: qty 1

## 2017-11-17 MED ORDER — ONDANSETRON 4 MG PO TBDP: 4.0000 mg | ORAL_TABLET | Freq: Three times a day (TID) | ORAL | 0 refills | Status: DC | PRN

## 2017-11-17 MED ORDER — KETOROLAC TROMETHAMINE 10 MG PO TABS
10.0000 mg | ORAL_TABLET | Freq: Once | ORAL | Status: AC
  Administered 2017-11-17: 10 mg via ORAL
  Filled 2017-11-17: qty 1

## 2017-11-17 MED ORDER — ONDANSETRON 4 MG PO TBDP
4.0000 mg | ORAL_TABLET | Freq: Once | ORAL | Status: AC
  Administered 2017-11-17: 4 mg via ORAL

## 2017-11-17 MED ORDER — LOPERAMIDE HCL 2 MG PO TABS: 2.0000 mg | ORAL_TABLET | Freq: Four times a day (QID) | ORAL | 0 refills | Status: DC | PRN

## 2017-11-17 MED ORDER — ACETAMINOPHEN 500 MG PO TABS
1000.0000 mg | ORAL_TABLET | Freq: Once | ORAL | Status: AC
  Administered 2017-11-17: 1000 mg via ORAL
  Filled 2017-11-17: qty 2

## 2017-11-17 MED ORDER — ACETAMINOPHEN 500 MG PO TABS
ORAL_TABLET | ORAL | Status: AC
  Administered 2017-11-17: 1000 mg via ORAL
  Filled 2017-11-17: qty 2

## 2017-11-17 MED ORDER — ONDANSETRON 4 MG PO TBDP
ORAL_TABLET | ORAL | Status: AC
  Administered 2017-11-17: 4 mg via ORAL
  Filled 2017-11-17: qty 1

## 2017-11-17 NOTE — ED Triage Notes (Addendum)
Pt to ED reporting bilateral stabbing flank pain that began yesterday. Pt reports having darker than normal urine but denies dysuria or frequency. Pt reports cold chills and NVD but denies having checked temperature at home. Pt is afebrile in triage. Pt also reporting generalized weakness and reported confusion throughout the day today. No neuro deficits noted. PT answering all questions appropriately at this time.   3 episodes of diarrhea reported today.  Multiple episodes of vomiting.
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
## 2017-11-17 NOTE — ED Notes (Signed)
Pt c/o BL flank pain , worse right after urinating. States she has had some N/V/D with the pain for the past 2 days. Pt is in NAD at present. States she had some vaginal discharge 4 days ago but none now. Denies any hx of kidney stones or infection in the past.

## 2017-11-17 NOTE — Discharge Instructions (Signed)
Drink plenty of fluids to stay well-hydrated.  Please take the entire course of antibiotics, even if you are feeling better.  Zofran is for nausea and loperamide is for diarrhea.  Please practice frequent and good handwashing to prevent the spread of infection.  Return to the emergency department if you develop severe pain, fever, inability to keep down fluids, lightheadedness or fainting, or any other symptoms concerning to you.

## 2017-12-21 ENCOUNTER — Emergency Department (HOSPITAL_COMMUNITY)
Admission: EM | Admit: 2017-12-21 | Discharge: 2017-12-22 | Disposition: A | Discharge status: Other Acute Inpatient Hospital | Payer: Medicaid Other | Attending: Emergency Medicine | Admitting: Emergency Medicine

## 2017-12-21 ENCOUNTER — Encounter (HOSPITAL_COMMUNITY): Payer: Self-pay | Admitting: Emergency Medicine

## 2017-12-21 ENCOUNTER — Other Ambulatory Visit: Payer: Self-pay

## 2017-12-21 DIAGNOSIS — F329 Major depressive disorder, single episode, unspecified: Secondary | ICD-10-CM | POA: Insufficient documentation

## 2017-12-21 DIAGNOSIS — R45851 Suicidal ideations: Secondary | ICD-10-CM | POA: Insufficient documentation

## 2017-12-21 DIAGNOSIS — F17210 Nicotine dependence, cigarettes, uncomplicated: Secondary | ICD-10-CM | POA: Diagnosis not present

## 2017-12-21 DIAGNOSIS — J45909 Unspecified asthma, uncomplicated: Secondary | ICD-10-CM | POA: Insufficient documentation

## 2017-12-21 DIAGNOSIS — Z79899 Other long term (current) drug therapy: Secondary | ICD-10-CM | POA: Diagnosis not present

## 2017-12-21 DIAGNOSIS — F32A Depression, unspecified: Secondary | ICD-10-CM

## 2017-12-21 LAB — CBC WITH DIFFERENTIAL/PLATELET
BASOS ABS: 0 10*3/uL (ref 0.0–0.1)
BASOS PCT: 0 %
EOS PCT: 3 %
Eosinophils Absolute: 0.3 10*3/uL (ref 0.0–0.7)
HCT: 41 % (ref 36.0–46.0)
Hemoglobin: 13.9 g/dL (ref 12.0–15.0)
LYMPHS PCT: 34 %
Lymphs Abs: 3 10*3/uL (ref 0.7–4.0)
MCH: 29.9 pg (ref 26.0–34.0)
MCHC: 33.9 g/dL (ref 30.0–36.0)
MCV: 88.2 fL (ref 78.0–100.0)
MONO ABS: 0.5 10*3/uL (ref 0.1–1.0)
MONOS PCT: 5 %
Neutro Abs: 5.2 10*3/uL (ref 1.7–7.7)
Neutrophils Relative %: 58 %
PLATELETS: 371 10*3/uL (ref 150–400)
RBC: 4.65 MIL/uL (ref 3.87–5.11)
RDW: 12.7 % (ref 11.5–15.5)
WBC: 8.9 10*3/uL (ref 4.0–10.5)

## 2017-12-21 LAB — ACETAMINOPHEN LEVEL: Acetaminophen (Tylenol), Serum: <10 ug/mL — ABNORMAL LOW (ref 10–30)

## 2017-12-21 LAB — RAPID URINE DRUG SCREEN, HOSP PERFORMED
Amphetamines: NOT DETECTED
BARBITURATES: NOT DETECTED
Benzodiazepines: POSITIVE — AB
Cocaine: NOT DETECTED
OPIATES: NOT DETECTED
Tetrahydrocannabinol: NOT DETECTED

## 2017-12-21 LAB — BASIC METABOLIC PANEL
Anion gap: 12 (ref 5–15)
BUN: 6 mg/dL (ref 6–20)
CALCIUM: 8.8 mg/dL — AB (ref 8.9–10.3)
CO2: 24 mmol/L (ref 22–32)
Chloride: 108 mmol/L (ref 101–111)
Creatinine, Ser: 0.73 mg/dL (ref 0.44–1.00)
Glucose, Bld: 119 mg/dL — ABNORMAL HIGH (ref 65–99)
Potassium: 3.4 mmol/L — ABNORMAL LOW (ref 3.5–5.1)
Sodium: 144 mmol/L (ref 135–145)

## 2017-12-21 LAB — SALICYLATE LEVEL: Salicylate Lvl: <7 mg/dL (ref 2.8–30.0)

## 2017-12-21 LAB — I-STAT BETA HCG BLOOD, ED (MC, WL, AP ONLY): I-stat hCG, quantitative: <5 m[IU]/mL (ref <5)

## 2017-12-21 LAB — ETHANOL: ALCOHOL ETHYL (B): 48 mg/dL — AB (ref <10)

## 2017-12-21 MED ORDER — IBUPROFEN 800 MG PO TABS
800.0000 mg | ORAL_TABLET | Freq: Four times a day (QID) | ORAL | Status: DC | PRN
  Filled 2017-12-21: qty 1

## 2017-12-21 MED ORDER — VORTIOXETINE HBR 5 MG PO TABS: 5.0000 mg | ORAL_TABLET | Freq: Every day | ORAL | Status: DC

## 2017-12-21 MED ORDER — ALBUTEROL SULFATE HFA 108 (90 BASE) MCG/ACT IN AERS
2.0000 | INHALATION_SPRAY | Freq: Four times a day (QID) | RESPIRATORY_TRACT | Status: DC | PRN
Start: 2017-12-21 — End: 2017-12-22
  Filled 2017-12-21: qty 6.7

## 2017-12-21 NOTE — ED Notes (Signed)
Pt asked nurse "what are we waiting on, will I stay here all night" Nurse explained to patient that she will be admitted to inpatient care in a facility. Pt states she understands and asked for a sheet listing the visiting hours. Policy on visiting hours for behavioral health patients given to patient. Pt calm and resting at this time.
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
## 2017-12-21 NOTE — ED Notes (Signed)
TTS consult at this time. 

## 2017-12-21 NOTE — ED Notes (Signed)
Patient resting comfortably

## 2017-12-21 NOTE — ED Notes (Signed)
Patient asleep and resting comfortably.  
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
## 2017-12-21 NOTE — ED Provider Notes (Signed)
Emergency Department Provider Note   I have reviewed the triage vital signs and the nursing notes.   HISTORY  Chief Complaint V70.1   HPI Karen Shields is a 27 y.o. female with PMH of anxiety, asthma, and GERD to department for evaluation of worsening depression, hopelessness, and passive suicidal thinking.  Patient states that she had previously been on Lexapro and Xanax and was doing well with that combination therapy.  In March 2018 she had to abruptly switch providers and the new provider started her on Trintellix and d/c other meds.  Mood seemed to stabilize and she had good energy.  Her insurance stopped covering the Trintellix last week and she is unable to afford her medication.  She called her primary care doctor, Dr. Janna Arch, who [place] and unable to adjust her medicines.    Over the last week the patient states that she sleeps all night and all day while her kids are at school.  She states she does not want to wake up but denies taking any action to harm herself or others.  Her husband, at bedside, states that she has increased paranoia about his activities and is very fearful about going outside.  Patient states that she feels a nonspecific threat with leaving the house that is debilitating for her.  She denies any active hallucinations or homicidal ideation.  She does have a history of suicide attempt in the distant past.    Past Medical History:  Diagnosis Date  . Anxiety   . Asthma    WELL CONTROLLED  . Asthma   . Eczema 2008  . Gallstones   . GERD (gastroesophageal reflux disease)   . Headache   . Hx MRSA infection 09-2014  . Ovarian cyst     Patient Active Problem List   Diagnosis Date Noted  . Gallstone   . RUQ pain 12/25/2015  . Gallstones   . Reflux esophagitis   . Nausea without vomiting 11/04/2015  . Cholelithiases 11/04/2015  . GERD (gastroesophageal reflux disease) 11/04/2015  . Early satiety 11/04/2015  . MRSA (methicillin  resistant staph aureus) culture positive 12/11/2014  . Menstrual migraine without status migrainosus, not intractable 07/10/2014  . Anxiety 06/14/2013  . Maternal substance abuse 04/14/2013  . H/O cold sores 04/12/2013  . Ovarian cyst, left 02/09/2011    Past Surgical History:  Procedure Laterality Date  . CHOLECYSTECTOMY N/A 01/16/2016   Procedure: LAPAROSCOPIC CHOLECYSTECTOMY;  Surgeon: Lattie Haw, MD;  Location: [facility] ORS;  Service: General;  Laterality: N/A;  . ESOPHAGOGASTRODUODENOSCOPY N/A 11/13/2015   ZOX:WRUEAVW reflux   . OVARIAN CYST REMOVAL    . OVARIAN CYST REMOVAL  2011  . TUBAL LIGATION    . WISDOM TOOTH EXTRACTION      Current Outpatient Rx  . Order #: [id number] Class: Normal  . Order #: [id number] Class: Historical Med  . Order #: [id number] Class: Historical Med  . Order #: [id number] Class: Historical Med  . Order #: [id number] Class: Historical Med  . Order #: [id number] Class: Historical Med  . Order #: [id number] Class: Print  . Order #: [id number] Class: Print  . Order #: [id number] Class: Print  . Order #: [id number] Class: Print    Allergies Latex  Family History  Problem Relation Age of Onset  . Diabetes Mother   . Colon cancer Neg Hx   . Crohn's disease Neg Hx   . Celiac disease Neg Hx     Social History Social History   Tobacco Use  . Smoking status: Current  Every Day Smoker    Packs/day: 1.00    Years: 5.00    Pack years: 5.00    Types: Cigarettes    Start date: 02/11/2006  . Smokeless tobacco: Never Used  Substance Use Topics  . Alcohol use: Yes    Alcohol/week: 0.0 oz    Comment: occassionally  . Drug use: No    Types: Benzodiazepines, Opium    Review of Systems  Constitutional: No fever/chills Eyes: No visual changes. ENT: No sore throat. Cardiovascular: Denies chest pain. Respiratory: Denies shortness of breath. Gastrointestinal: No abdominal pain.  No nausea, no vomiting.  No diarrhea.  No constipation. Genitourinary: Negative for  dysuria. Musculoskeletal: Negative for back pain. Skin: Negative for rash. Neurological: Negative for headaches, focal weakness or numbness. Psychiatric:Increased hopelessness, depression, paranoia, and passive death wishes.   10-point ROS otherwise negative.  ____________________________________________   PHYSICAL EXAM:  VITAL SIGNS: ED Triage Vitals  Enc Vitals Group     BP 12/21/17 1733 133/71     Pulse Rate 12/21/17 1733 (!) 129     Resp 12/21/17 1733 18     Temp 12/21/17 1733 98.6 F (37 C)     Temp src --      SpO2 12/21/17 1733 99 %     Weight --      Height 12/21/17 1735 5\' 4"  (1.626 m)     Pain Score 12/21/17 1735 0   Constitutional: Alert and oriented. Tearful during interview.  Eyes: Conjunctivae are normal.  Head: Atraumatic. Nose: No congestion/rhinnorhea. Mouth/Throat: Mucous membranes are moist.   Neck: No stridor.  Respiratory: Normal respiratory effort.  Gastrointestinal:  No distention.  Musculoskeletal: No gross deformities of extremities. Neurologic:  Normal speech and language.  Skin:  Skin is warm, dry and intact.  Psychiatric: Mood and affect are slightly flat. Speech and behavior are normal. Tearful at times.   ____________________________________________   LABS (all labs ordered are listed, but only abnormal results are displayed)  Labs Reviewed  BASIC METABOLIC PANEL - Abnormal; Notable for the following components:      Result Value   Potassium 3.4 (*)    Glucose, Bld 119 (*)    Calcium 8.8 (*)    All other components within normal limits  ACETAMINOPHEN LEVEL - Abnormal; Notable for the following components:   Acetaminophen (Tylenol), Serum <10 (*)    All other components within normal limits  ETHANOL - Abnormal; Notable for the following components:   Alcohol, Ethyl (B) 48 (*)    All other components within normal limits  RAPID URINE DRUG SCREEN, HOSP PERFORMED - Abnormal; Notable for the following components:   Benzodiazepines  POSITIVE (*)    All other components within normal limits  SALICYLATE LEVEL  CBC WITH DIFFERENTIAL/PLATELET  I-STAT BETA HCG BLOOD, ED ([facility], [facility], [facility] ONLY)   ____________________________________________  RADIOLOGY  None ____________________________________________   PROCEDURES  Procedure(s) performed:   Procedures  None ____________________________________________   INITIAL IMPRESSION / ASSESSMENT AND PLAN / ED COURSE  Pertinent labs & imaging results that were available during my care of the patient were reviewed by me and considered in my medical decision making (see chart for details).  Patient presents to the emergency department for evaluation of worsening depression, passive suicidal ideation, and increased paranoia especially regarding her husband's activities and with leaving the house.  She is been off of her Trintellix for the last week with her insurance not covering this medication any longer.  She does have a past history of  suicide attempt by cutting her wrist. Denies any specific plan but with paranoia, worsening, depression, and passive death wishes the patient will need psychiatry evaluation. Will provide medical clearance.   08:12 PM Reviewed the patient's labs which are unremarkable. Patient is medically clear for psychiatry evaluation. Awaiting TTS evaluation.   TTS recommending inpatient hospitalization. No IVC at this time. Re-ordered patient's home meds.  ____________________________________________  FINAL CLINICAL IMPRESSION(S) / ED DIAGNOSES  Final diagnoses:  Depression, unspecified depression type     MEDICATIONS GIVEN DURING THIS VISIT:  Medications  albuterol (PROVENTIL HFA;VENTOLIN HFA) 108 (90 Base) MCG/ACT inhaler 2 puff (2 puffs Inhalation Refused 12/21/17 2040)  vortioxetine HBr (TRINTELLIX) tablet 5 mg (has no administration in time range)  ibuprofen (ADVIL,MOTRIN) tablet 800 mg (800 mg Oral Refused 12/21/17 2040)     Note:  This  document was prepared using Dragon voice recognition software and may include unintentional dictation errors.  Alona Bene, MD Emergency Medicine    Shatara Stanek, Arlyss Repress, MD 12/21/17 2053

## 2017-12-21 NOTE — ED Triage Notes (Signed)
Pt was on depression medications for one year and her insurance stopped paying for it a week ago.  Pt states having suicidal thoughts x 1 week.  States "I just want to go to sleep and never wake up"

## 2017-12-21 NOTE — ED Notes (Signed)
Removed camera from room after completing TTS

## 2017-12-22 ENCOUNTER — Encounter (HOSPITAL_COMMUNITY): Payer: Self-pay

## 2017-12-22 ENCOUNTER — Inpatient Hospital Stay (HOSPITAL_COMMUNITY)
Admission: AD | Admit: 2017-12-22 | Discharge: 2017-12-26 | DRG: 885 | Disposition: A | Discharge status: Home / Self Care | Payer: Medicaid Other | Source: Intra-hospital | Attending: Psychiatry | Admitting: Psychiatry

## 2017-12-22 ENCOUNTER — Other Ambulatory Visit: Payer: Self-pay

## 2017-12-22 DIAGNOSIS — Z62810 Personal history of physical and sexual abuse in childhood: Secondary | ICD-10-CM

## 2017-12-22 DIAGNOSIS — F4010 Social phobia, unspecified: Secondary | ICD-10-CM

## 2017-12-22 DIAGNOSIS — Z23 Encounter for immunization: Secondary | ICD-10-CM | POA: Diagnosis not present

## 2017-12-22 DIAGNOSIS — R450 Nervousness: Secondary | ICD-10-CM | POA: Diagnosis not present

## 2017-12-22 DIAGNOSIS — F410 Panic disorder [episodic paroxysmal anxiety] without agoraphobia: Secondary | ICD-10-CM

## 2017-12-22 DIAGNOSIS — R45851 Suicidal ideations: Secondary | ICD-10-CM | POA: Diagnosis present

## 2017-12-22 DIAGNOSIS — F419 Anxiety disorder, unspecified: Secondary | ICD-10-CM | POA: Diagnosis not present

## 2017-12-22 DIAGNOSIS — T43226A Underdosing of selective serotonin reuptake inhibitors, initial encounter: Secondary | ICD-10-CM | POA: Diagnosis present

## 2017-12-22 DIAGNOSIS — F1910 Other psychoactive substance abuse, uncomplicated: Secondary | ICD-10-CM | POA: Diagnosis not present

## 2017-12-22 DIAGNOSIS — Z833 Family history of diabetes mellitus: Secondary | ICD-10-CM

## 2017-12-22 DIAGNOSIS — F17210 Nicotine dependence, cigarettes, uncomplicated: Secondary | ICD-10-CM | POA: Diagnosis not present

## 2017-12-22 DIAGNOSIS — F332 Major depressive disorder, recurrent severe without psychotic features: Principal | ICD-10-CM | POA: Diagnosis present

## 2017-12-22 DIAGNOSIS — G4700 Insomnia, unspecified: Secondary | ICD-10-CM

## 2017-12-22 HISTORY — DX: Depression, unspecified: F32.A

## 2017-12-22 HISTORY — DX: Major depressive disorder, single episode, unspecified: F32.9

## 2017-12-22 LAB — COMPREHENSIVE METABOLIC PANEL
ALBUMIN: 3.8 g/dL (ref 3.5–5.0)
ALK PHOS: 91 U/L (ref 38–126)
ALT: 28 U/L (ref 14–54)
AST: 30 U/L (ref 15–41)
Anion gap: 6 (ref 5–15)
BUN: 8 mg/dL (ref 6–20)
CALCIUM: 9.4 mg/dL (ref 8.9–10.3)
CHLORIDE: 110 mmol/L (ref 101–111)
CO2: 26 mmol/L (ref 22–32)
CREATININE: 0.67 mg/dL (ref 0.44–1.00)
GFR calc non Af Amer: >60 mL/min (ref >60)
GLUCOSE: 138 mg/dL — AB (ref 65–99)
Potassium: 3.5 mmol/L (ref 3.5–5.1)
SODIUM: 142 mmol/L (ref 135–145)
Total Bilirubin: 0.3 mg/dL (ref 0.3–1.2)
Total Protein: 6.6 g/dL (ref 6.5–8.1)

## 2017-12-22 LAB — TSH: TSH: 0.326 u[IU]/mL — ABNORMAL LOW (ref 0.350–4.500)

## 2017-12-22 LAB — PREGNANCY, URINE: PREG TEST UR: NEGATIVE

## 2017-12-22 MED ORDER — MAGNESIUM HYDROXIDE 400 MG/5ML PO SUSP: 30.0000 mL | Freq: Every day | ORAL | Status: DC | PRN

## 2017-12-22 MED ORDER — FAMOTIDINE 20 MG PO TABS
20.0000 mg | ORAL_TABLET | Freq: Two times a day (BID) | ORAL | Status: DC
  Administered 2017-12-22 – 2017-12-26 (×8): 20 mg via ORAL
  Filled 2017-12-22 (×12): qty 1

## 2017-12-22 MED ORDER — HYDROXYZINE HCL 25 MG PO TABS
25.0000 mg | ORAL_TABLET | Freq: Four times a day (QID) | ORAL | Status: DC | PRN
  Administered 2017-12-22 – 2017-12-26 (×7): 25 mg via ORAL
  Filled 2017-12-22 (×8): qty 1

## 2017-12-22 MED ORDER — ACETAMINOPHEN 325 MG PO TABS
650.0000 mg | ORAL_TABLET | Freq: Four times a day (QID) | ORAL | Status: DC | PRN
  Administered 2017-12-22 – 2017-12-25 (×6): 650 mg via ORAL
  Filled 2017-12-22 (×6): qty 2

## 2017-12-22 MED ORDER — HYDROCORTISONE 1 % EX LOTN
TOPICAL_LOTION | Freq: Two times a day (BID) | CUTANEOUS | Status: DC | PRN
  Filled 2017-12-22: qty 118

## 2017-12-22 MED ORDER — NICOTINE 21 MG/24HR TD PT24
21.0000 mg | MEDICATED_PATCH | Freq: Every day | TRANSDERMAL | Status: DC
  Administered 2017-12-22 – 2017-12-24 (×3): 21 mg via TRANSDERMAL
  Filled 2017-12-22 (×6): qty 1

## 2017-12-22 MED ORDER — ALUM & MAG HYDROXIDE-SIMETH 200-200-20 MG/5ML PO SUSP: 30.0000 mL | ORAL | Status: DC | PRN

## 2017-12-22 MED ORDER — ALBUTEROL SULFATE HFA 108 (90 BASE) MCG/ACT IN AERS
2.0000 | INHALATION_SPRAY | Freq: Four times a day (QID) | RESPIRATORY_TRACT | Status: DC | PRN
Start: 2017-12-22 — End: 2017-12-26

## 2017-12-22 MED ORDER — VITAMIN D 1000 UNITS PO TABS
5000.0000 [IU] | ORAL_TABLET | Freq: Every day | ORAL | Status: DC
  Administered 2017-12-22 – 2017-12-26 (×5): 5000 [IU] via ORAL
  Filled 2017-12-22 (×7): qty 5

## 2017-12-22 MED ORDER — VENLAFAXINE HCL ER 37.5 MG PO CP24
37.5000 mg | ORAL_CAPSULE | Freq: Every day | ORAL | Status: DC
  Administered 2017-12-22 – 2017-12-23 (×2): 37.5 mg via ORAL
  Filled 2017-12-22 (×4): qty 1

## 2017-12-22 MED ORDER — ESCITALOPRAM OXALATE 20 MG PO TABS
20.0000 mg | ORAL_TABLET | Freq: Every day | ORAL | Status: DC
  Administered 2017-12-22: 20 mg via ORAL
  Filled 2017-12-22 (×2): qty 1

## 2017-12-22 MED ORDER — VENLAFAXINE HCL ER 37.5 MG PO CP24: 37.5000 mg | ORAL_CAPSULE | Freq: Every day | ORAL | Status: DC

## 2017-12-22 MED ORDER — HYDROCORTISONE 1 % EX CREA
TOPICAL_CREAM | Freq: Two times a day (BID) | CUTANEOUS | Status: DC
  Administered 2017-12-22: 22:00:00 via TOPICAL
  Administered 2017-12-23: 1 via TOPICAL
  Administered 2017-12-23 – 2017-12-26 (×3): via TOPICAL
  Filled 2017-12-22 (×3): qty 28

## 2017-12-22 MED ORDER — TRAZODONE HCL 50 MG PO TABS
50.0000 mg | ORAL_TABLET | Freq: Every evening | ORAL | Status: DC | PRN
  Filled 2017-12-22 (×4): qty 1

## 2017-12-22 MED ORDER — PNEUMOCOCCAL VAC POLYVALENT 25 MCG/0.5ML IJ INJ
0.5000 mL | INJECTION | INTRAMUSCULAR | Status: AC
  Administered 2017-12-23: 0.5 mL via INTRAMUSCULAR

## 2017-12-22 MED ORDER — TRAZODONE HCL 50 MG PO TABS
50.0000 mg | ORAL_TABLET | Freq: Every evening | ORAL | Status: DC | PRN
  Administered 2017-12-22: 50 mg via ORAL
  Filled 2017-12-22: qty 1

## 2017-12-22 NOTE — [facility] Suicide Risk Assessment (Signed)
[facility] INPATIENT:  Family/Significant Other Suicide Prevention Education  Suicide Prevention Education:  Patient Refusal for Family/Significant Other Suicide Prevention Education: The patient Karen Shields has refused to provide written consent for family/significant other to be provided Family/Significant Other Suicide Prevention Education during admission and/or prior to discharge.  Physician notified.  Maeola SarahJolan E Adrain Butrick 12/22/2017, 11:42 AM

## 2017-12-22 NOTE — [facility] Group Notes (Signed)
LCSW Group Therapy Note  12/22/2017 1:15pm  Type of Therapy and Topic:  Group Therapy: Avoiding Self-Sabotaging and Enabling Behaviors  Participation Level:  Did Not Attend--pt meeting with provider during group. Excused.    Description of Group:   In this group, patients will learn how to identify obstacles, self-sabotaging and enabling behaviors, as well as: what are they, why do we do them and what needs these behaviors meet. Discuss unhealthy relationships and how to have positive healthy boundaries with those that sabotage and enable. Explore aspects of self-sabotage and enabling in yourself and how to limit these self-destructive behaviors in everyday life.   Therapeutic Goals: 1. Patient will identify one obstacle that relates to self-sabotage and enabling behaviors 2. Patient will identify one personal self-sabotaging or enabling behavior they did prior to admission 3. Patient will state a plan to change the above identified behavior 4. Patient will demonstrate ability to communicate their needs through discussion and/or role play.   Summary of Patient Progress:   x  Therapeutic Modalities:   Cognitive Behavioral Therapy Person-Centered Therapy Motivational Interviewing   Pulte HomesHeather N Smart, LCSW 12/22/2017 3:50 PM

## 2017-12-22 NOTE — BH Assessment (Signed)
[facility] Assessment Progress Note   [facility] to services of Dr. Jama Flavorsobos.  [phone]) prior to transport.  Clinician informed Dr. Manus Gunningancour of disposition.
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
## 2017-12-22 NOTE — ED Provider Notes (Signed)
Patient accepted to behavioral health [facility] by Dr. Jama Flavorsobos.  She appears stable for transfer. Pulse has improved to 91 on recheck.   Karen Shields, Karen Prokop, MD 12/22/17 [phone]
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
## 2017-12-22 NOTE — ED Notes (Signed)
Patient making phone call at this time. 
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
## 2017-12-22 NOTE — ED Notes (Signed)
[facility] [employer]

## 2017-12-22 NOTE — Progress Notes (Signed)
[facility] Group Notes:  (Nursing/MHT/Case Management/Adjunct)  Date:  12/22/2017  Time:  1600 Type of Therapy:  Nurse Education-Aromatherapy   Participation Level: Did not attend.    Conducted by Jan. Wright, RN.   

## 2017-12-22 NOTE — [facility] Group Notes (Signed)
Adult Psychoeducational Group Note  Date:  12/22/2017 Time:  11:58 PM  Group Topic/Focus:  Wrap-Up Group:   The focus of this group is to help patients review their daily goal of treatment and discuss progress on daily workbooks.  Participation Level:  Active  Participation Quality:  Appropriate and Attentive  Affect:  Appropriate  Cognitive:  Alert and Appropriate  Insight: Appropriate and Good  Engagement in Group:  Engaged  Modes of Intervention:  Discussion and Education  Additional Comments:  Pt attended and participated in wrap up group this evening. Pt rated their day a 5/10 due to them having a good visit today. Pt goal was to be able to be happy with themselves and to be social.   Karen NettersOctavia A Shaquel Chavous 12/22/2017, 11:58 PM
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
## 2017-12-22 NOTE — Progress Notes (Signed)
Patient ID: Titus DubinKendall B Sayre, female   DOB: [birth year], 27 y.o.   MRN: [id number]  D: Patient calm and cooperative through shift, denies SI/HI/AVH, reports a fair appetite, ate all of her meals today.  Pt stayed back for breakfast this morning, but a tray was brought to her in her room.  Pt reports that her sleep quality last night was fair, she reports her energy level as normal, and reports her concentration level as poor.  Pt rates her depression as 6 (10 being the worst), rates her hopelessness level as 7 (10 being the worst), and rates her anxiety today as 9 (10 being the worst).    A: Pt being given all meds as scheduled, was medicated with Atarax 25mg  for anxiety and Tylenol  650mg  for pain.  Pt being maintained on Q15 minute checks for anxiety.  R: Pt denies any current concerns, will maintain on Q15 minute safety checks.
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
## 2017-12-23 DIAGNOSIS — F1910 Other psychoactive substance abuse, uncomplicated: Secondary | ICD-10-CM

## 2017-12-23 MED ORDER — TRAZODONE HCL 50 MG PO TABS
50.0000 mg | ORAL_TABLET | Freq: Every evening | ORAL | Status: DC | PRN
  Administered 2017-12-23 – 2017-12-25 (×4): 50 mg via ORAL
  Filled 2017-12-23 (×4): qty 1

## 2017-12-23 MED ORDER — VENLAFAXINE HCL ER 75 MG PO CP24
75.0000 mg | ORAL_CAPSULE | Freq: Every day | ORAL | Status: DC
  Administered 2017-12-24 – 2017-12-26 (×3): 75 mg via ORAL
  Filled 2017-12-23 (×5): qty 1

## 2017-12-23 NOTE — BHH Group Notes (Signed)
LCSW Group Therapy Note  12/23/2017 1:15pm  Type of Therapy and Topic:  Group Therapy:  Feelings around Relapse and Recovery  Participation Level:  Did Not Attend--pt invited. Chose to remain in bed.    Description of Group:    Patients in this group will discuss emotions they experience before and after a relapse. They will process how experiencing these feelings, or avoidance of experiencing them, relates to having a relapse. Facilitator will guide patients to explore emotions they have related to recovery. Patients will be encouraged to process which emotions are more powerful. They will be guided to discuss the emotional reaction significant others in their lives may have to their relapse or recovery. Patients will be assisted in exploring ways to respond to the emotions of others without this contributing to a relapse.  Therapeutic Goals: 1. Patient will identify two or more emotions that lead to a relapse for them 2. Patient will identify two emotions that result when they relapse 3. Patient will identify two emotions related to recovery 4. Patient will demonstrate ability to communicate their needs through discussion and/or role plays   Summary of Patient Progress:  x   Therapeutic Modalities:   Cognitive Behavioral Therapy Solution-Focused Therapy Assertiveness Training Relapse Prevention Therapy   Ledell PeoplesHeather N Smart, LCSW 12/23/2017 3:04 PM

## 2017-12-23 NOTE — Progress Notes (Signed)
Recreation Therapy Notes  Date: 4.5.19 Time: 9:30 a.m. Location: [address] Dayroom       Group Topic/Focus: Stress Management   Goal Area(s) Addresses:  Patient will engage in pro-social way in yoga group with.  Patient will demonstrate no behavioral issues during group.   Behavioral Response: Appropriate   Intervention: Yoga   Clinical Observations/Feedback: Patient participated in yoga group, lead by Recreation Therapy Intern. Recreation Therapy Intern led group through various yoga poses and breathing techniques. Patient engaged in yoga practice appropriately and demonstrated no behavioral issues during group.   Sheryle Hailarian Jonanthony Nahar, Recreation Therapy Intern   Sheryle HailDarian Vallarie Fei 12/23/2017 11:50 AM

## 2017-12-23 NOTE — Progress Notes (Signed)
The patient attended the evening A.A.meeting and was appropriate.  

## 2017-12-23 NOTE — Progress Notes (Signed)
DAR NOTE: Pt present with flat affect and depressed mood in the unit. Pt has been visible in the milieu.  Pt denies physical pain, took all her meds as scheduled. As per self inventory, pt had a poor night sleep, fair appetite, lowl energy, and good concentration. Pt rate depression at 07, hopeless ness at 05, and anxiety at 08. Pt's safety ensured with 15 minute and environmental checks. Pt currently denies SI/HI and A/V hallucinations. Pt verbally agrees to seek staff if SI/HI or A/VH occurs and to consult with staff before acting on these thoughts. Will continue POC.

## 2017-12-23 NOTE — Progress Notes (Addendum)
Patient ID: Titus DubinKendall B Ruminski, female   DOB: 06-Jan-1991, 27 y.o.   MRN: [id number]  Pt currently presents with an anxious affect and behavior. Pt reports to writer that their goal for today was to "be on a schedule, get up and go to groups." Pt states "My goal for tomorrow is to "adult" and make sure that I take care of myself by taking my medications and handling by depression." Endorses ongoing anxiety. Ruminates over somatic symptoms that she has been experiencing since she was a teenager, dizziness/hotflashes. States to Clinical research associatewriter "I think I may have heart problems." Pt reports good sleep with current medication regimen.   Pt provided with medications per providers orders. Pt's labs and vitals were monitored throughout the night. BP/P WDL. Pt given a 1:1 about emotional and mental status. Pt supported and encouraged to express concerns and questions. Pt educated on medications.  Pt's safety ensured with 15 minute and environmental checks. Pt currently denies SI/HI and A/V hallucinations. Pt verbally agrees to seek staff if SI/HI or A/VH occurs and to consult with staff before acting on any harmful thoughts. Reports, tearfully, that her motivation for recovery are her two girls. Will continue POC.

## 2017-12-23 NOTE — Progress Notes (Addendum)
[facility] MD Progress Note  12/23/2017 3:51 PM Karen Shields  MRN:  [id number]   Subjective:  Patient reports that she feels better than yesterday. She feels some depression and anxiety. She denies any SI/HI/AVH and contracts for safety. She reports that last night she felt like it took forever to go to sleep and she took the Trazodone. She also asks if she should be treated for Bipolar, but then states that when she starts feeling good from the antidepressant she stops taking them and things get bad again.  Objective: Patient's chart and findings reviewed and discussed with treatment team. Patient presents in the milieu interacting with peers and staff a[ppropriately. She has been attending group today and has been pleasant and cooperative. Her concern for Bipolar is noted, but she shows no significant signs at this time for Bipolar. As for her disrupted sleep, she received her Effexor late yesterday. So will give her another day and will add a repeat dose of Trazodone if needed tonight.    Principal Problem: MDD (major depressive disorder), recurrent episode, severe ([place]) Diagnosis:   Patient Active Problem List   Diagnosis Date Noted  . MDD (major depressive disorder), recurrent episode, severe ([place]) [F33.2] 12/22/2017  . Gallstone [K80.20]   . RUQ pain [R10.11] 12/25/2015  . Gallstones [K80.20]   . Reflux esophagitis [K21.0]   . Nausea without vomiting [R11.0] 11/04/2015  . Cholelithiases [K80.20] 11/04/2015  . GERD (gastroesophageal reflux disease) [K21.9] 11/04/2015  . Early satiety [R68.81] 11/04/2015  . MRSA (methicillin resistant staph aureus) culture positive [Z22.322] 12/11/2014  . Menstrual migraine without status migrainosus, not intractable [G43.829] 07/10/2014  . Anxiety [F41.9] 06/14/2013  . 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 H/O cold sores [Z86.19] 04/12/2013  . Ovarian cyst, left [N83.202] 02/09/2011   Total Time spent with patient: 30 minutes  Past  Psychiatric History: See H&P  Past Medical History:  Past Medical History:  Diagnosis Date  . Anxiety   . Asthma    WELL CONTROLLED  . Asthma   . Depression    pt stated  . Eczema 2008  . Gallstones   . GERD (gastroesophageal reflux disease)   . Headache   . Hx MRSA infection 09-2014  . Ovarian cyst     Past Surgical History:  Procedure Laterality Date  . CHOLECYSTECTOMY N/A 01/16/2016   Procedure: LAPAROSCOPIC CHOLECYSTECTOMY;  Surgeon: Florene Glen, MD;  Location: [facility] ORS;  Service: General;  Laterality: N/A;  . ESOPHAGOGASTRODUODENOSCOPY N/A 11/13/2015   [id number] reflux   . OVARIAN CYST REMOVAL    . OVARIAN CYST REMOVAL  2011  . TUBAL LIGATION    . WISDOM TOOTH EXTRACTION     Family History:  Family History  Problem Relation Age of Onset  . Diabetes Mother   . Colon cancer Neg Hx   . Crohn's disease Neg Hx   . Celiac disease Neg Hx    Family Psychiatric  History: See H&P Social History:  Social History   Substance and Sexual Activity  Alcohol Use Yes  . Alcohol/week: 0.0 oz   Comment: occassionally     Social History   Substance and Sexual Activity  Drug Use No  . Types: Benzodiazepines, Opium    Social History   Socioeconomic History  . Marital status: Married    Spouse name: Not on file  . Number of children: 2  . Years of education: Not on file  . Highest education level: Not on file  Occupational History  .  Not on file  Social Needs  . Financial resource strain: Not on file  . Food insecurity:    Worry: Not on file    Inability: Not on file  . Transportation needs:    Medical: Not on file    Non-medical: Not on file  Tobacco Use  . Smoking status: Current Every Day Smoker    Packs/day: 1.00    Years: 5.00    Pack years: 5.00    Types: Cigarettes    Start date: 02/11/2006  . Smokeless tobacco: Never Used  Substance and Sexual Activity  . Alcohol use: Yes    Alcohol/week: 0.0 oz    Comment: occassionally  . Drug use: No     Types: Benzodiazepines, Opium  . Sexual activity: Yes    Partners: Male    Birth control/protection: Surgical  Lifestyle  . Physical activity:    Days per week: Not on file    Minutes per session: Not on file  . Stress: Not on file  Relationships  . Social connections:    Talks on phone: Not on file    Gets together: Not on file    Attends religious service: Not on file    Active member of club or organization: Not on file    Attends meetings of clubs or organizations: Not on file    Relationship status: Not on file  Other Topics Concern  . Not on file  Social History Narrative   ** Merged History Encounter **    Four daughters between herself and husband, two Oncologist.    Additional Social History:      1 - Age of First Use: 14 1 - Amount (size/oz): 3 drinks 1 - Frequency: every 6 months                  Sleep: Fair  Appetite:  Good  Current Medications: Current Facility-Administered Medications  Medication Dose Route Frequency Provider Last Rate Last Dose  . acetaminophen (TYLENOL) tablet 650 mg  650 mg Oral Q6H PRN Laverle Hobby, PA-C   650 mg at 12/23/17 7628  . albuterol (PROVENTIL HFA;VENTOLIN HFA) 108 (90 Base) MCG/ACT inhaler 2 puff  2 puff Inhalation Q6H PRN Laverle Hobby, PA-C      . alum & mag hydroxide-simeth (MAALOX/MYLANTA) 200-200-20 MG/5ML suspension 30 mL  30 mL Oral Q4H PRN Laverle Hobby, PA-C      . cholecalciferol (VITAMIN D) tablet 5,000 Units  5,000 Units Oral Daily Money, Lowry Ram, FNP   5,000 Units at 12/23/17 0830  . famotidine (PEPCID) tablet 20 mg  20 mg Oral BID Money, Lowry Ram, FNP   20 mg at 12/23/17 0830  . hydrocortisone cream 1 %   Topical BID Lindon Romp A, NP   1 application at 31/51/76 0830  . hydrOXYzine (ATARAX/VISTARIL) tablet 25 mg  25 mg Oral Q6H PRN Patriciaann Clan E, PA-C   25 mg at 12/22/17 1503  . magnesium hydroxide (MILK OF MAGNESIA) suspension 30 mL  30 mL Oral Daily PRN Patriciaann Clan E, PA-C      .  nicotine (NICODERM CQ - dosed in mg/24 hours) patch 21 mg  21 mg Transdermal Daily Patriciaann Clan E, PA-C   21 mg at 12/23/17 0836  . traZODone (DESYREL) tablet 50 mg  50 mg Oral QHS PRN,MR X 1 Money, Lowry Ram, FNP      . [START ON 12/24/2017] venlafaxine XR (EFFEXOR-XR) 24 hr capsule 75 mg  75 mg Oral  Q breakfast Money, Lowry Ram, [place]        Lab Results:  Results for orders placed or performed during the hospital encounter of 12/22/17 (from the past 48 hour(s))  Pregnancy, urine     Status: None   Collection Time: 12/22/17 12:15 PM  Result Value Ref Range   Preg Test, Ur NEGATIVE NEGATIVE    Comment:        THE SENSITIVITY OF THIS METHODOLOGY IS >20 mIU/mL. Performed at [facility], [place] [address], [place], [place] [address]   Comprehensive metabolic panel     Status: Abnormal   Collection Time: 12/22/17  6:53 PM  Result Value Ref Range   Sodium 142 135 - 145 mmol/L   Potassium 3.5 3.5 - 5.1 mmol/L   Chloride 110 101 - 111 mmol/L   CO2 26 22 - 32 mmol/L   Glucose, Bld 138 (H) 65 - 99 mg/dL   BUN 8 6 - 20 mg/dL   Creatinine, Ser 0.67 0.44 - 1.00 mg/dL   Calcium 9.4 8.9 - 10.3 mg/dL   Total Protein 6.6 6.5 - 8.1 g/dL   Albumin 3.8 3.5 - 5.0 g/dL   AST 30 15 - 41 U/L   ALT 28 14 - 54 U/L   Alkaline Phosphatase 91 38 - 126 U/L   Total Bilirubin 0.3 0.3 - 1.2 mg/dL   GFR calc non Af Amer >60 >60 mL/min   GFR calc Af Amer >60 >60 mL/min    Comment: (NOTE) The eGFR has been calculated using the CKD EPI equation. This calculation has not been validated in all clinical situations. eGFR's persistently <60 mL/min signify possible Chronic Kidney Disease.    Anion gap 6 5 - 15    Comment: Performed at [facility], [place] [address] [place], [place] [address]  TSH     Status: Abnormal   Collection Time: 12/22/17  6:53 PM  Result Value Ref Range   TSH 0.326 (L) 0.350 - 4.500 uIU/mL    Comment: Performed by a 3rd Generation assay with a functional  sensitivity of <=0.01 uIU/mL. Performed at [facility], [place] [address], [place], [place] [address]     Blood Alcohol level:  Lab Results  Component Value Date   ETH 48 (H) 61/95/0932    Metabolic Disorder Labs: No results found for: HGBA1C, MPG No results found for: PROLACTIN No results found for: CHOL, TRIG, HDL, CHOLHDL, VLDL, LDLCALC  Physical Findings: AIMS: Facial and Oral Movements Muscles of Facial Expression: None, normal Lips and Perioral Area: None, normal Jaw: None, normal Tongue: None, normal,Extremity Movements Upper (arms, wrists, hands, fingers): None, normal Lower (legs, knees, ankles, toes): None, normal, Trunk Movements Neck, shoulders, hips: None, normal, Overall Severity Severity of abnormal movements (highest score from questions above): None, normal Incapacitation due to abnormal movements: None, normal Patient's awareness of abnormal movements (rate only patient's report): No Awareness, Dental Status Current problems with teeth and/or dentures?: No Does patient usually wear dentures?: No  CIWA:  CIWA-Ar Total: 7 COWS:     Musculoskeletal: Strength & Muscle Tone: within normal limits Gait & Station: normal Patient leans: N/A  Psychiatric Specialty Exam: Physical Exam  Nursing note and vitals reviewed. Constitutional: She is oriented to person, place, and time. She appears well-developed and well-nourished.  Cardiovascular: Normal rate.  Respiratory: Effort normal.  Musculoskeletal: Normal range of motion.  Neurological: She is alert and oriented to person, place, and time.  Skin: Skin is warm.  Review of Systems  Constitutional: Negative.   HENT: Negative.   Eyes: Negative.   Respiratory: Negative.   Cardiovascular: Negative.   Genitourinary: Negative.   Musculoskeletal: Negative.   Skin: Negative.   Neurological: Negative.   Endo/Heme/Allergies: Negative.   Psychiatric/Behavioral: Positive for depression. Negative  for hallucinations and suicidal ideas. The patient is nervous/anxious.     Blood pressure 118/80, pulse 82, temperature 97.6 F (36.4 C), temperature source Oral, resp. rate 16, height '5\' 4"'  (1.626 m), weight 77.6 kg (171 lb), SpO2 100 %.Body mass index is 29.35 kg/m.  General Appearance: Casual  Eye Contact:  Good  Speech:  Clear and Coherent and Normal Rate  Volume:  Normal  Mood:  Anxious and Depressed  Affect:  Congruent  Thought Process:  Goal Directed and Descriptions of Associations: Intact  Orientation:  Full (Time, Place, and Person)  Thought Content:  WDL  Suicidal Thoughts:  No  Homicidal Thoughts:  No  Memory:  Immediate;   Good Recent;   Good Remote;   Good  Judgement:  Fair  Insight:  Fair  Psychomotor Activity:  Normal  Concentration:  Concentration: Good and Attention Span: Good  Recall:  Good  Fund of Knowledge:  Good  Language:  Good  Akathisia:  No  Handed:  Right  AIMS (if indicated):     Assets:  Communication Skills Desire for Improvement Financial Resources/Insurance Housing Physical Health Social Support Transportation  ADL's:  Intact  Cognition:  WNL  Sleep:  Number of Hours: 2(late admission)   Problems Addressed: MDD severe  Treatment Plan Summary: Daily contact with patient to assess and evaluate symptoms and progress in treatment, Medication management and Plan is to:  -Increase Effexor-XR 75 mg PO Daily for mood stability -Increase Trazodone 50 mg PO QHS PRN and may repeat dose x 1 for insomnia -Continue Vistaril 25 mg PO Q6H PRN for anxiety -Encourage group therapy participation  Lewis Shock, FNP 12/23/2017, 3:51 PM   Agree with NP Progress Note

## 2017-12-23 NOTE — Progress Notes (Signed)
D: When asked about her day pt stated, "getting better now that I've got [place]". Pt stated, she has "social phobia".  Informed the writer that she was able to participate in the night groups, and heard other people's stories. Stated, "they have some of the same problems I do".  Stated, "I don't really feel depressed, but something's going on with me". Pt also asked for cortisone cream for her areola. Pt's skin was irritated and raw. Pt has no other questions or concerns.    A:  Support and encouragement was offered. 15 min checks continued for safety.  R: Pt remains safe.

## 2017-12-24 MED ORDER — MIRTAZAPINE 15 MG PO TABS
15.0000 mg | ORAL_TABLET | Freq: Every day | ORAL | Status: DC
  Administered 2017-12-24 – 2017-12-25 (×2): 15 mg via ORAL
  Filled 2017-12-24 (×4): qty 1

## 2017-12-24 MED ORDER — LORATADINE 10 MG PO TABS
10.0000 mg | ORAL_TABLET | Freq: Every day | ORAL | Status: DC
  Administered 2017-12-24 – 2017-12-26 (×3): 10 mg via ORAL
  Filled 2017-12-24 (×6): qty 1

## 2017-12-24 MED ORDER — NICOTINE POLACRILEX 2 MG MT GUM
2.0000 mg | CHEWING_GUM | OROMUCOSAL | Status: DC | PRN
  Administered 2017-12-24 – 2017-12-26 (×4): 2 mg via ORAL
  Filled 2017-12-24: qty 1
  Filled 2017-12-24: qty 10

## 2017-12-24 MED ORDER — FLUTICASONE PROPIONATE 50 MCG/ACT NA SUSP: 2.0000 | Freq: Two times a day (BID) | NASAL | Status: DC | PRN

## 2017-12-24 NOTE — Progress Notes (Addendum)
[facility] Progress Note  12/24/2017 2:00 PM Karen Shields  MRN:  [id number]   Subjective:  Patient states that she is doing good today. She reports that she did not sleep well again. The medication doesn't seem to be doing enough. She reports depression and anxiety still, but they are gradually improving. She denies any SI/HI/AVH and contracts for safety.   Objective: Patient's chart and findings reviewed and discussed with treatment team. Patient presents in the day room and has been interacting appropriately with staff and peers. She has been very pleasant and seen smiling and talking. She has attended group and participated. Will start Remeron 15 mg QHS for sleep and mood stability. Will start Claritan and Flonase for sinus and allergy symptoms.    Principal Problem: MDD (major depressive disorder), recurrent episode, severe ([place]) Diagnosis:   Patient Active Problem List   Diagnosis Date Noted  . MDD (major depressive disorder), recurrent episode, severe ([place]) [F33.2] 12/22/2017  . Gallstone [K80.20]   . RUQ pain [R10.11] 12/25/2015  . Gallstones [K80.20]   . Reflux esophagitis [K21.0]   . Nausea without vomiting [R11.0] 11/04/2015  . Cholelithiases [K80.20] 11/04/2015  . GERD (gastroesophageal reflux disease) [K21.9] 11/04/2015  . Early satiety [R68.81] 11/04/2015  . MRSA (methicillin resistant staph aureus) culture positive [Z22.322] 12/11/2014  . Menstrual migraine without status migrainosus, not intractable [G43.829] 07/10/2014  . Anxiety [F41.9] 06/14/2013  . 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 H/O cold sores [Z86.19] 04/12/2013  . Ovarian cyst, left [N83.202] 02/09/2011   Total Time spent with patient: 30 minutes  Past Psychiatric History: See H&P  Past Medical History:  Past Medical History:  Diagnosis Date  . Anxiety   . Asthma    WELL CONTROLLED  . Asthma   . Depression    pt stated  . Eczema 2008  . Gallstones   . GERD (gastroesophageal reflux  disease)   . Headache   . Hx MRSA infection 09-2014  . Ovarian cyst     Past Surgical History:  Procedure Laterality Date  . CHOLECYSTECTOMY N/A 01/16/2016   Procedure: LAPAROSCOPIC CHOLECYSTECTOMY;  Surgeon: Florene Glen, MD;  Location: [facility] ORS;  Service: General;  Laterality: N/A;  . ESOPHAGOGASTRODUODENOSCOPY N/A 11/13/2015   VOP:[id number] reflux   . OVARIAN CYST REMOVAL    . OVARIAN CYST REMOVAL  2011  . TUBAL LIGATION    . WISDOM TOOTH EXTRACTION     Family History:  Family History  Problem Relation Age of Onset  . Diabetes Mother   . Colon cancer Neg Hx   . Crohn's disease Neg Hx   . Celiac disease Neg Hx    Family Psychiatric  History: See H&P Social History:  Social History   Substance and Sexual Activity  Alcohol Use Yes  . Alcohol/week: 0.0 oz   Comment: occassionally     Social History   Substance and Sexual Activity  Drug Use No  . Types: Benzodiazepines, Opium    Social History   Socioeconomic History  . Marital status: Married    Spouse name: Not on file  . Number of children: 2  . Years of education: Not on file  . Highest education level: Not on file  Occupational History  . Not on file  Social Needs  . Financial resource strain: Not on file  . Food insecurity:    Worry: Not on file    Inability: Not on file  . Transportation needs:    Medical: Not on file  Non-medical: Not on file  Tobacco Use  . Smoking status: Current Every Day Smoker    Packs/day: 1.00    Years: 5.00    Pack years: 5.00    Types: Cigarettes    Start date: 02/11/2006  . Smokeless tobacco: Never Used  Substance and Sexual Activity  . Alcohol use: Yes    Alcohol/week: 0.0 oz    Comment: occassionally  . Drug use: No    Types: Benzodiazepines, Opium  . Sexual activity: Yes    Partners: Male    Birth control/protection: Surgical  Lifestyle  . Physical activity:    Days per week: Not on file    Minutes per session: Not on file  . Stress: Not on file   Relationships  . Social connections:    Talks on phone: Not on file    Gets together: Not on file    Attends religious service: Not on file    Active member of club or organization: Not on file    Attends meetings of clubs or organizations: Not on file    Relationship status: Not on file  Other Topics Concern  . Not on file  Social History Narrative   ** Merged History Encounter **    Four daughters between herself and husband, two Oncologist.    Additional Social History:      1 - Age of First Use: 14 1 - Amount (size/oz): 3 drinks 1 - Frequency: every 6 months                  Sleep: Fair  Appetite:  Good  Current Medications: Current Facility-Administered Medications  Medication Dose Route Frequency Provider Last Rate Last Dose  . acetaminophen (TYLENOL) tablet 650 mg  650 mg Oral Q6H PRN Laverle Hobby, PA-C   650 mg at 12/24/17 0857  . albuterol (PROVENTIL HFA;VENTOLIN HFA) 108 (90 Base) MCG/ACT inhaler 2 puff  2 puff Inhalation Q6H PRN Laverle Hobby, PA-C      . alum & mag hydroxide-simeth (MAALOX/MYLANTA) 200-200-20 MG/5ML suspension 30 mL  30 mL Oral Q4H PRN Laverle Hobby, PA-C      . cholecalciferol (VITAMIN D) tablet 5,000 Units  5,000 Units Oral Daily Money, Lowry Ram, FNP   5,000 Units at 12/24/17 0800  . famotidine (PEPCID) tablet 20 mg  20 mg Oral BID Money, Lowry Ram, FNP   20 mg at 12/24/17 0801  . fluticasone (FLONASE) 50 MCG/ACT nasal spray 2 spray  2 spray Each Nare BID PRN Money, Lowry Ram, FNP      . hydrocortisone cream 1 %   Topical BID Lindon Romp A, NP      . hydrOXYzine (ATARAX/VISTARIL) tablet 25 mg  25 mg Oral Q6H PRN Patriciaann Clan E, PA-C   25 mg at 12/24/17 1011  . loratadine (CLARITIN) tablet 10 mg  10 mg Oral Daily Money, Lowry Ram, FNP   10 mg at 12/24/17 1004  . magnesium hydroxide (MILK OF MAGNESIA) suspension 30 mL  30 mL Oral Daily PRN Patriciaann Clan E, PA-C      . mirtazapine (REMERON) tablet 15 mg  15 mg Oral QHS Money,  Travis B, FNP      . nicotine (NICODERM CQ - dosed in mg/24 hours) patch 21 mg  21 mg Transdermal Daily Patriciaann Clan E, PA-C   21 mg at 12/24/17 0801  . traZODone (DESYREL) tablet 50 mg  50 mg Oral QHS PRN,MR X 1 Money, Lowry Ram, FNP  50 mg at 12/23/17 2256  . venlafaxine XR (EFFEXOR-XR) 24 hr capsule 75 mg  75 mg Oral Q breakfast Money, Lowry Ram, FNP   75 mg at 12/24/17 0801    Lab Results:  Results for orders placed or performed during the hospital encounter of 12/22/17 (from the past 48 hour(s))  Comprehensive metabolic panel     Status: Abnormal   Collection Time: 12/22/17  6:53 PM  Result Value Ref Range   Sodium 142 135 - 145 mmol/L   Potassium 3.5 3.5 - 5.1 mmol/L   Chloride 110 101 - 111 mmol/L   CO2 26 22 - 32 mmol/L   Glucose, Bld 138 (H) 65 - 99 mg/dL   BUN 8 6 - 20 mg/dL   Creatinine, Ser 0.67 0.44 - 1.00 mg/dL   Calcium 9.4 8.9 - 10.3 mg/dL   Total Protein 6.6 6.5 - 8.1 g/dL   Albumin 3.8 3.5 - 5.0 g/dL   AST 30 15 - 41 U/L   ALT 28 14 - 54 U/L   Alkaline Phosphatase 91 38 - 126 U/L   Total Bilirubin 0.3 0.3 - 1.2 mg/dL   GFR calc non Af Amer >60 >60 mL/min   GFR calc Af Amer >60 >60 mL/min    Comment: (NOTE) The eGFR has been calculated using the CKD EPI equation. This calculation has not been validated in all clinical situations. eGFR's persistently <60 mL/min signify possible Chronic Kidney Disease.    Anion gap 6 5 - 15    Comment: Performed at [facility], [place] [address], [place], [place] [address]  TSH     Status: Abnormal   Collection Time: 12/22/17  6:53 PM  Result Value Ref Range   TSH 0.326 (L) 0.350 - 4.500 uIU/mL    Comment: Performed by a 3rd Generation assay with a functional sensitivity of <=0.01 uIU/mL. Performed at [facility], [place] [address], [place], [place] [address]     Blood Alcohol level:  Lab Results  Component Value Date   ETH 48 (H) 27/25/3664    Metabolic Disorder Labs: No results  found for: HGBA1C, MPG No results found for: PROLACTIN No results found for: CHOL, TRIG, HDL, CHOLHDL, VLDL, LDLCALC  Physical Findings: AIMS: Facial and Oral Movements Muscles of Facial Expression: None, normal Lips and Perioral Area: None, normal Jaw: None, normal Tongue: None, normal,Extremity Movements Upper (arms, wrists, hands, fingers): None, normal Lower (legs, knees, ankles, toes): None, normal, Trunk Movements Neck, shoulders, hips: None, normal, Overall Severity Severity of abnormal movements (highest score from questions above): None, normal Incapacitation due to abnormal movements: None, normal Patient's awareness of abnormal movements (rate only patient's report): No Awareness, Dental Status Current problems with teeth and/or dentures?: No Does patient usually wear dentures?: No  CIWA:  CIWA-Ar Total: 7 COWS:     Musculoskeletal: Strength & Muscle Tone: within normal limits Gait & Station: normal Patient leans: N/A  Psychiatric Specialty Exam: Physical Exam  Nursing note and vitals reviewed. Constitutional: She is oriented to person, place, and time. She appears well-developed and well-nourished.  Respiratory: Effort normal.  Musculoskeletal: Normal range of motion.  Neurological: She is alert and oriented to person, place, and time.  Skin: Skin is warm.    Review of Systems  Constitutional: Negative.   HENT: Negative.   Eyes: Negative.   Respiratory: Negative.   Cardiovascular: Negative.   Genitourinary: Negative.   Musculoskeletal: Negative.   Skin: Negative.   Neurological: Negative.   Endo/Heme/Allergies:  Negative.   Psychiatric/Behavioral: Positive for depression. Negative for hallucinations and suicidal ideas. The patient is nervous/anxious.     Blood pressure 115/77, pulse (!) 102, temperature 97.8 F (36.6 C), resp. rate 16, height '5\' 4"'  (1.626 m), weight 77.6 kg (171 lb), SpO2 100 %.Body mass index is 29.35 kg/m.  General Appearance: Casual   Eye Contact:  Good  Speech:  Clear and Coherent and Normal Rate  Volume:  Normal  Mood:  Anxious and Depressed improving  Affect:  Congruent  Thought Process:  Goal Directed and Descriptions of Associations: Intact  Orientation:  Full (Time, Place, and Person)  Thought Content:  WDL  Suicidal Thoughts:  No  Homicidal Thoughts:  No  Memory:  Immediate;   Good Recent;   Good Remote;   Good  Judgement:  Fair  Insight:  Fair  Psychomotor Activity:  Normal  Concentration:  Concentration: Good and Attention Span: Good  Recall:  Good  Fund of Knowledge:  Good  Language:  Good  Akathisia:  No  Handed:  Right  AIMS (if indicated):     Assets:  Communication Skills Desire for Improvement Financial Resources/Insurance Housing Physical Health Social Support Transportation  ADL's:  Intact  Cognition:  WNL  Sleep:  Number of Hours: 5.25   Problems Addressed: MDD severe  Treatment Plan Summary: Daily contact with patient to assess and evaluate symptoms and progress in treatment, Medication management and Plan is to:  -Continue Effexor-XR 75 mg PO Daily for mood stability -Start Remeron 15 mg PO QHS for mood stability and sleep -Start Claritan 10 mg PO Daily fo rallergies -Start Fluticasone 2 sprays each nare BID PRN for sinus congestion -Continue Trazodone 50 mg PO QHS PRN and may repeat dose x 1 for insomnia -Continue Vistaril 25 mg PO Q6H PRN for anxiety -Encourage group therapy participation  Lewis Shock, FNP 12/24/2017, 2:00 PM   Agree with NP Progress Note

## 2017-12-24 NOTE — Progress Notes (Signed)
D: Patient states she is here for "severe anxiety."  She states, "I just keep stuff bottled up.  I need to attend some groups when I get out of here."  She requested vistaril earlier and later came back stating, "that pill did nothing for me.  I know xanax  can be addicting, but that was what helped me before."  She asked nursing staff to tell the NP that she needs something else "to help with my anxiety."  She denies any thoughts of self harm.  She rates her depression as a 5; hopelessness as a 2 and anxiety as a 10.  Her sleep has improved since admission.  Her goal today is "to stay positive and keep myself in the meetings."  Patient reports some withdrawal symptoms such as agitation, nausea and irritability.  A: Continue to monitor medication management and MD orders.  Safety checks completed every 15 minutes per protocol.  Offer support and encouragement as needed.  R: Patient is receptive to staff; her behavior is appropriate.

## 2017-12-24 NOTE — Plan of Care (Signed)
  Problem: Safety: Goal: Periods of time without injury will increase Outcome: Progressing Note:  Pt has not harmed self or others tonight.  She denies SI/HI and verbally contracts for safety.    

## 2017-12-24 NOTE — Progress Notes (Signed)
Patient did attend the evening speaker AA meeting.  

## 2017-12-24 NOTE — Progress Notes (Signed)
D: Pt was in the dayroom upon initial approach.  Pt presents with anxious affect and mood.  She reports her day was "better."  Reports goal is "to get back home and be with my kids and be happy around them, take care of myself and take my medicine."  Pt denies SI/HI, denies hallucinations, reports pain from headache of 2/10.  Pt has been visible in milieu interacting with peers and staff appropriately.  She has spent a great deal of time on the phone tonight.  Pt attended evening group.    A: Introduced self to pt.  Actively listened to pt and offered support and encouragement. Medication administered per order.  PRN medication administered for anxiety, pain.  Q15 minute safety checks maintained.  R: Pt is safe on the unit.  Pt is compliant with medications.  Pt verbally contracts for safety.  Will continue to monitor and assess.

## 2017-12-25 NOTE — Plan of Care (Signed)
Patient verbalized understanding of information, education provided.

## 2017-12-25 NOTE — [facility] Group Notes (Signed)
[facility] LCSW Group Therapy Note  Date/Time: 12/25/17, 0900  Type of Therapy/Topic:  Group Therapy:  Feelings about Diagnosis  Participation Level:  Active   Mood:pleasant   Description of Group:    This group will allow patients to explore their thoughts and feelings about diagnoses they have received. Patients will be guided to explore their level of understanding and acceptance of these diagnoses. Facilitator will encourage patients to process their thoughts and feelings about the reactions of others to their diagnosis, and will guide patients in identifying ways to discuss their diagnosis with significant others in their lives. This group will be process-oriented, with patients participating in exploration of their own experiences as well as giving and receiving support and challenge from other group members.   Therapeutic Goals: 1. Patient will demonstrate understanding of diagnosis as evidence by identifying two or more symptoms of the disorder:  2. Patient will be able to express two feelings regarding the diagnosis 3. Patient will demonstrate ability to communicate their needs through discussion and/or role plays  Summary of Patient Progress:Pt had good participation in group talking about symptoms of several diagnoses, stigma, and appropriate response to diagnosis.  Pt identified feeling more happy and having increased energy regarding moving forward in life with her diagnosis.           Therapeutic Modalities:   Cognitive Behavioral Therapy Brief Therapy Feelings Identification   Daleen SquibbGreg Anaria Kroner, LCSW

## 2017-12-25 NOTE — Progress Notes (Signed)
D: Pt stated that she slept well last night and has more energy today. Pt states no complaints. Pt seen interacting with others in the dayroom. Pt feels that since she has more energy/feels better that she is ready to go home. "my kids need me". Pt thought she had signed a 72 hour but had not. Pt stated need for nicotine gum. Pt reports 0/10 depression and 3/10 anxiety. Pt stated her goal for today was to improve her tomorrow by starting with her today. Pt states she wants to be "safe to go home and I believe I'm committed to taking my medicine this time that I feel is working a lot".  A: patient given 72 hour form and pt signed. Pt given meds per orders. Support and encouragement provided. 15 minute checks continued. R: patient contracts for safety. Denies si/hi. Will continue to monitor.

## 2017-12-25 NOTE — Progress Notes (Addendum)
[facility] MD Progress Note  12/25/2017 9:34 AM Karen Shields  MRN:  [id number]   Subjective:  Patient reports that she feels much better today. "I can't believe it. I really slept good last night and I feel really good today." She denies any medication side effects. She denies nay SI/HI/AVH and contracts for safety. She reports mild anxiety, but the medications and sleep have really helped.   Objective: Patient's chart and findings reviewed and discussed with treatment team. Patient presents in the day room and has been more active today. She has stayed out of her room more and has been talkative to staff and peers. She appears euthymic, pleasant, and smiling. Will continue current medications and hope to discharge in the next 1-2 days.    Principal Problem: MDD (major depressive disorder), recurrent episode, severe (HCC) Diagnosis:   Patient Active Problem List   Diagnosis Date Noted  . MDD (major depressive disorder), recurrent episode, severe (HCC) [F33.2] 12/22/2017  . Gallstone [K80.20]   . RUQ pain [R10.11] 12/25/2015  . Gallstones [K80.20]   . Reflux esophagitis [K21.0]   . Nausea without vomiting [R11.0] 11/04/2015  . Cholelithiases [K80.20] 11/04/2015  . GERD (gastroesophageal reflux disease) [K21.9] 11/04/2015  . Early satiety [R68.81] 11/04/2015  . MRSA (methicillin resistant staph aureus) culture positive [Z22.322] 12/11/2014  . Menstrual migraine without status migrainosus, not intractable [G43.829] 07/10/2014  . Anxiety [F41.9] 06/14/2013  . 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 H/O cold sores [Z86.19] 04/12/2013  . Ovarian cyst, left [N83.202] 02/09/2011   Total Time spent with patient: 15 minutes  Past Psychiatric History: See H&P  Past Medical History:  Past Medical History:  Diagnosis Date  . Anxiety   . Asthma    WELL CONTROLLED  . Asthma   . Depression    pt stated  . Eczema 2008  . Gallstones   . GERD (gastroesophageal reflux disease)   .  Headache   . Hx MRSA infection 09-2014  . Ovarian cyst     Past Surgical History:  Procedure Laterality Date  . CHOLECYSTECTOMY N/A 01/16/2016   Procedure: LAPAROSCOPIC CHOLECYSTECTOMY;  Surgeon: Lattie Haw, MD;  Location: [facility] ORS;  Service: General;  Laterality: N/A;  . ESOPHAGOGASTRODUODENOSCOPY N/A 11/13/2015   NWG:NFAOZHY reflux   . OVARIAN CYST REMOVAL    . OVARIAN CYST REMOVAL  2011  . TUBAL LIGATION    . WISDOM TOOTH EXTRACTION     Family History:  Family History  Problem Relation Age of Onset  . Diabetes Mother   . Colon cancer Neg Hx   . Crohn's disease Neg Hx   . Celiac disease Neg Hx    Family Psychiatric  History: See H&P Social History:  Social History   Substance and Sexual Activity  Alcohol Use Yes  . Alcohol/week: 0.0 oz   Comment: occassionally     Social History   Substance and Sexual Activity  Drug Use No  . Types: Benzodiazepines, Opium    Social History   Socioeconomic History  . Marital status: Married    Spouse name: Not on file  . Number of children: 2  . Years of education: Not on file  . Highest education level: Not on file  Occupational History  . Not on file  Social Needs  . Financial resource strain: Not on file  . Food insecurity:    Worry: Not on file    Inability: Not on file  . Transportation needs:    Medical: Not on file  Non-medical: Not on file  Tobacco Use  . Smoking status: Current Every Day Smoker    Packs/day: 1.00    Years: 5.00    Pack years: 5.00    Types: Cigarettes    Start date: 02/11/2006  . Smokeless tobacco: Never Used  Substance and Sexual Activity  . Alcohol use: Yes    Alcohol/week: 0.0 oz    Comment: occassionally  . Drug use: No    Types: Benzodiazepines, Opium  . Sexual activity: Yes    Partners: Male    Birth control/protection: Surgical  Lifestyle  . Physical activity:    Days per week: Not on file    Minutes per session: Not on file  . Stress: Not on file  Relationships  .  Social connections:    Talks on phone: Not on file    Gets together: Not on file    Attends religious service: Not on file    Active member of club or organization: Not on file    Attends meetings of clubs or organizations: Not on file    Relationship status: Not on file  Other Topics Concern  . Not on file  Social History Narrative   ** Merged History Encounter **    Four daughters between herself and husband, two biological.    Additional Social History:      1 - Age of First Use: 14 1 - Amount (size/oz): 3 drinks 1 - Frequency: every 6 months                  Sleep: Good  Appetite:  Good  Current Medications: Current Facility-Administered Medications  Medication Dose Route Frequency Provider Last Rate Last Dose  . acetaminophen (TYLENOL) tablet 650 mg  650 mg Oral Q6H PRN Kerry HoughSimon, Spencer E, PA-C   650 mg at 12/24/17 2001  . albuterol (PROVENTIL HFA;VENTOLIN HFA) 108 (90 Base) MCG/ACT inhaler 2 puff  2 puff Inhalation Q6H PRN Kerry HoughSimon, Spencer E, PA-C      . alum & mag hydroxide-simeth (MAALOX/MYLANTA) 200-200-20 MG/5ML suspension 30 mL  30 mL Oral Q4H PRN Kerry HoughSimon, Spencer E, PA-C      . cholecalciferol (VITAMIN D) tablet 5,000 Units  5,000 Units Oral Daily Money, Gerlene Burdockravis B, FNP   5,000 Units at 12/25/17 [id number]  . famotidine (PEPCID) tablet 20 mg  20 mg Oral BID Money, Gerlene Burdockravis B, FNP   20 mg at 12/25/17 0807  . fluticasone (FLONASE) 50 MCG/ACT nasal spray 2 spray  2 spray Each Nare BID PRN Money, Gerlene Burdockravis B, FNP      . hydrocortisone cream 1 %   Topical BID Nira ConnBerry, Jason A, NP      . hydrOXYzine (ATARAX/VISTARIL) tablet 25 mg  25 mg Oral Q6H PRN Donell SievertSimon, Spencer E, PA-C   25 mg at 12/24/17 2003  . loratadine (CLARITIN) tablet 10 mg  10 mg Oral Daily Money, Gerlene Burdockravis B, FNP   10 mg at 12/25/17 [id number]  . magnesium hydroxide (MILK OF MAGNESIA) suspension 30 mL  30 mL Oral Daily PRN Donell SievertSimon, Spencer E, PA-C      . mirtazapine (REMERON) tablet 15 mg  15 mg Oral QHS Money, Gerlene Burdockravis B, FNP   15 mg at  12/24/17 2102  . nicotine polacrilex (NICORETTE) gum 2 mg  2 mg Oral PRN Cobos, Rockey SituFernando A, MD   2 mg at 12/24/17 2003  . traZODone (DESYREL) tablet 50 mg  50 mg Oral QHS PRN,MR X 1 Money, Feliz Beamravis B, FNP   50 mg  at 12/23/17 2256  . venlafaxine XR (EFFEXOR-XR) 24 hr capsule 75 mg  75 mg Oral Q breakfast Money, Gerlene Burdock, FNP   75 mg at 12/25/17 5621    Lab Results:  No results found for this or any previous visit (from the past 48 hour(s)).  Blood Alcohol level:  Lab Results  Component Value Date   ETH 48 (H) 12/21/2017    Metabolic Disorder Labs: No results found for: HGBA1C, MPG No results found for: PROLACTIN No results found for: CHOL, TRIG, HDL, CHOLHDL, VLDL, LDLCALC  Physical Findings: AIMS: Facial and Oral Movements Muscles of Facial Expression: None, normal Lips and Perioral Area: None, normal Jaw: None, normal Tongue: None, normal,Extremity Movements Upper (arms, wrists, hands, fingers): None, normal Lower (legs, knees, ankles, toes): None, normal, Trunk Movements Neck, shoulders, hips: None, normal, Overall Severity Severity of abnormal movements (highest score from questions above): None, normal Incapacitation due to abnormal movements: None, normal Patient's awareness of abnormal movements (rate only patient's report): No Awareness, Dental Status Current problems with teeth and/or dentures?: No Does patient usually wear dentures?: No  CIWA:  CIWA-Ar Total: 7 COWS:     Musculoskeletal: Strength & Muscle Tone: within normal limits Gait & Station: normal Patient leans: N/A  Psychiatric Specialty Exam: Physical Exam  Nursing note and vitals reviewed. Constitutional: She is oriented to person, place, and time. She appears well-developed and well-nourished.  Cardiovascular: Normal rate.  Respiratory: Effort normal.  Musculoskeletal: Normal range of motion.  Neurological: She is alert and oriented to person, place, and time.  Skin: Skin is warm.    Review of  Systems  Constitutional: Negative.   HENT: Negative.   Eyes: Negative.   Respiratory: Negative.   Cardiovascular: Negative.   Genitourinary: Negative.   Musculoskeletal: Negative.   Skin: Negative.   Neurological: Negative.   Endo/Heme/Allergies: Negative.   Psychiatric/Behavioral: Positive for depression. Negative for hallucinations and suicidal ideas. The patient is nervous/anxious.     Blood pressure 130/70, pulse 94, temperature 98.2 F (36.8 C), temperature source Oral, resp. rate 18, height 5\' 4"  (1.626 m), weight 77.6 kg (171 lb), SpO2 100 %.Body mass index is 29.35 kg/m.  General Appearance: Casual  Eye Contact:  Good  Speech:  Clear and Coherent and Normal Rate  Volume:  Normal  Mood:  Euthymic   Affect:  Congruent  Thought Process:  Goal Directed and Descriptions of Associations: Intact  Orientation:  Full (Time, Place, and Person)  Thought Content:  WDL  Suicidal Thoughts:  No  Homicidal Thoughts:  No  Memory:  Immediate;   Good Recent;   Good Remote;   Good  Judgement:  Fair  Insight:  Good  Psychomotor Activity:  Normal  Concentration:  Concentration: Good and Attention Span: Good  Recall:  Good  Fund of Knowledge:  Good  Language:  Good  Akathisia:  No  Handed:  Right  AIMS (if indicated):     Assets:  Communication Skills Desire for Improvement Financial Resources/Insurance Housing Physical Health Social Support Transportation  ADL's:  Intact  Cognition:  WNL  Sleep:  Number of Hours: 5   Problems Addressed: MDD severe  Treatment Plan Summary: Daily contact with patient to assess and evaluate symptoms and progress in treatment, Medication management and Plan is to:  -Continue Effexor-XR 75 mg PO Daily for mood stability -Continue Remeron 15 mg PO QHS for mood stability and sleep -Continue Trazodone 50 mg PO QHS PRN and may repeat dose x 1 for insomnia -Continue Vistaril  25 mg PO Q6H PRN for anxiety -Encourage group therapy  participation  Maryfrances Bunnell, FNP 12/25/2017, 9:34 AM   Agree with NP Progress Note

## 2017-12-25 NOTE — Progress Notes (Signed)
Patient signed 72 hour RFD which was filed in paper chart. NP Money made aware.

## 2017-12-25 NOTE — [facility] Group Notes (Signed)
[facility] Group Notes:  (Nursing/MHT/Case Management/Adjunct)  Date:  12/25/2017  Time:  5:36 PM Type of Therapy:  Psychoeducational Skills  Participation Level:  Active  Participation Quality:  Appropriate  Affect:  Appropriate  Cognitive:  Appropriate  Insight:  Appropriate  Engagement in Group:  Engaged  Modes of Intervention:  Problem-solving Summary of Progress/Problems: Pt shared with each about their story and how they battle with addiction.    Nilam Quakenbush O Athalia Setterlund 12/25/2017, 5:36 PM  

## 2017-12-25 NOTE — Progress Notes (Addendum)
Patient asked staff to call husband as he would like patient discharged today. Patient has been told her discharge date would be tomorrow at the earliest. Asked that we call husband, Scottie [phone]) to explain discharge plan. Attempted x 2 however no answer and no voicemail available. Will continue to attempt contact.  Add note @1650 : Patient provided updated/corrected phone number - [phone]. Husband understanding of D/C plan. Informed husband patient would know after tx team tomorrow morning whether she would discharge tomorrow.

## 2017-12-25 NOTE — Progress Notes (Signed)
D.  Pt pleasant on approach, denies complaints at this time.  Pt was positive for evening AA group, has been actively engaged in appropriate interaction with peers on the unit.  Pt denies SI/HI/AVH at this time.  A.  Support and encouragement offered, medication given as ordered  R.  Pt remains safe on the unit, will continue to monitor.

## 2017-12-26 MED ORDER — VENLAFAXINE HCL ER 75 MG PO CP24: 75.0000 mg | ORAL_CAPSULE | Freq: Every day | ORAL | 0 refills | Status: DC

## 2017-12-26 MED ORDER — MIRTAZAPINE 15 MG PO TABS: 15.0000 mg | ORAL_TABLET | Freq: Every day | ORAL | 0 refills | Status: DC

## 2017-12-26 MED ORDER — NICOTINE POLACRILEX 2 MG MT GUM: 2.0000 mg | CHEWING_GUM | OROMUCOSAL | 0 refills | Status: DC | PRN

## 2017-12-26 MED ORDER — TRAZODONE HCL 50 MG PO TABS: 50.0000 mg | ORAL_TABLET | Freq: Every evening | ORAL | 0 refills | Status: DC | PRN

## 2017-12-26 NOTE — Progress Notes (Signed)
  [facility] :  Will you be returning to the same living situation after discharge:  Yes,  returning home with her mother At discharge, do you have transportation home?: Yes,  patient reports her husband will pick her up at discharge Do you have the ability to pay for your medications: Yes,  Medicaid  Release of information consent forms completed and in the chart;  Patient's signature needed at discharge.  Patient to Follow up at: Follow-up Information    [facility].   Why:  CSW unable to schedule an appointment prior to discharge. Walk In hours are every Tuesday, 12:00pm-3:00pm and every Thursday 8:00am-11:00am Contact information: [address] [place] [address] [phone]           Next level of care provider [facility] Link:no  Safety Planning and Suicide Prevention discussed: No.  Have you used any form of tobacco in the last 30 days? (Cigarettes, Smokeless Tobacco, Cigars, and/or Pipes): Yes  Has patient been referred to the Quitline?: Patient refused referral  Patient has been referred for addiction treatment: Pt. refused referral  Maeola SarahJolan E Maham Quintin, LCSWA 12/26/2017, 9:25 AM

## 2017-12-26 NOTE — [facility] Suicide Risk Assessment (Addendum)
[facility] Discharge Suicide Risk Assessment   Principal Problem: MDD (major depressive disorder), recurrent episode, severe (HCC) Discharge Diagnoses:  Patient Active Problem List   Diagnosis Date Noted  . MDD (major depressive disorder), recurrent episode, severe (HCC) [F33.2] 12/22/2017  . Gallstone [K80.20]   . RUQ pain [R10.11] 12/25/2015  . Gallstones [K80.20]   . Reflux esophagitis [K21.0]   . Nausea without vomiting [R11.0] 11/04/2015  . Cholelithiases [K80.20] 11/04/2015  . GERD (gastroesophageal reflux disease) [K21.9] 11/04/2015  . Early satiety [R68.81] 11/04/2015  . MRSA (methicillin resistant staph aureus) culture positive [Z22.322] 12/11/2014  . Menstrual migraine without status migrainosus, not intractable [G43.829] 07/10/2014  . Anxiety [F41.9] 06/14/2013  . 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 H/O cold sores [Z86.19] 04/12/2013  . Ovarian cyst, left [N83.202] 02/09/2011    Total Time spent with patient: 30 minutes  Musculoskeletal: Strength & Muscle Tone: within normal limits Gait & Station: normal Patient leans: N/A  Psychiatric Specialty Exam: ROS no headache, no chest pain, no shortness of breath, no vomiting, no fever, no chills   Blood pressure 130/70, pulse 94, temperature 98.2 F (36.8 C), temperature source Oral, resp. rate 18, height 5\' 4"  (1.626 m), weight 77.6 kg (171 lb), SpO2 100 %.Body mass index is 29.35 kg/m.  General Appearance: Well Groomed  Eye Contact::  Good  Speech:  Normal Rate409  Volume:  Normal  Mood:  euthymic, states her mood is much improved, describes as 9/10  Affect:  Appropriate and Full Range  Thought Process:  Linear and Descriptions of Associations: Intact  Orientation:  Full (Time, Place, and Person)  Thought Content:  no hallucinations, no delusions, not internally preoccupied  Suicidal Thoughts:  No denies any suicidal or self injurious ideations, denies any homicidal or violent ideations   Homicidal Thoughts:   No  Memory:  recent and remote grossly intact   Judgement:  Other:  improved   Insight:  improved   Psychomotor Activity:  Normal  Concentration:  Good  Recall:  Good  Fund of Knowledge:Good  Language: Good  Akathisia:  Negative  Handed:  Right  AIMS (if indicated):     Assets:  Communication Skills Desire for Improvement Physical Health Resilience  Sleep:  Number of Hours: 5.5  Cognition: WNL  ADL's:  Intact   Mental Status Per Nursing Assessment::   On Admission:     Demographic Factors:  27 year old female, married, separated, has two children, living with mother  Loss Factors: Marital stressors, states husband gambles heavily, grandfather passed away 06/18/2023  Historical Factors: History of depression,history of panic attacks/ anxiety, no prior psychiatric admissions .   Risk Reduction Factors:   Responsible for children under 72 years of age, Living with another person, especially a relative and Positive coping skills or problem solving skills  Continued Clinical Symptoms:  Alert and attentive, well related, well groomed, mood improved , presents euthymic, with full range of affect, reports feeling less anxious, no thought disorder, no suicidal or self injurious ideations, denies any homicidal or violent ideations, denies hallucinations, no delusions, not internally preoccupied . Denies medication side effects. Behavior on unit in good control, pleasant on approach.   Cognitive Features That Contribute To Risk:  No gross cognitive deficits noted upon discharge. Is alert , attentive, and oriented x 3   Suicide Risk:  Mild:  Suicidal ideation of limited frequency, intensity, duration, and specificity.  There are no identifiable plans, no associated intent, mild dysphoria and related symptoms, good self-control (both  objective and subjective assessment), few other risk factors, and identifiable protective factors, including available and accessible social  support.  Follow-up Information    [facility].   Why:  CSW unable to schedule an appointment prior to discharge. Walk In hours are every Tuesday, 12:00pm-3:00pm and every Thursday 8:00am-11:00am Contact information: [address] [place] [address] [phone]           Plan Of Care/Follow-up recommendations:  Activity:  as tolerated  Diet:  regular Tests:  NA Other:  See below   Patient expresses readiness for discharge and is leaving unit in good spirits  Plans to return home Plans to follow up as above  She has an established PCP for medical issues as needed -patient aware that TSH slightly decreased and encouraged to follow up with PCP for further monitoring /management if needed.   Craige CottaFernando A Davianna Deutschman, MD 12/26/2017, 10:48 AM

## 2017-12-26 NOTE — Progress Notes (Addendum)
Patient ID: Karen Shields, female   DOB: 1991-02-13, 27 y.o.   MRN: [id number]   D: Patient with bright affect, denied SI/HI/AVH this morning and verbalized readiness for discharge.  Pt reported earlier in shift that her goal for today was "to see my girls and my husband", and stated that she intends to continue to take her medication at home to prevent future hospitalizations.  Pt reported her depression today as 0 (10 being the lowest), reported her energy level as being normal, and reported her anxiety earlier in the day as 3 (10 being the highest).    A: Patient was educated on all of her medications and given all meds as scheduled.  Pt was also medicated with Atarax 57m for complaints of anxiety.  Pt was maintained on Q15 minute safety checks.  Pt was educated on her discharge instructions and verbalized understanding, and [facility], personal belongings and prescription scripts.  Transportation home was provided by her husband who met her at the entrance of the hospital.  R: Pt was educated on suicide hotline numbers and verbalized understanding and was encouraged to seek assistance in the future as needed.  Pt had no concerns prior to leaving the hospital.

## 2017-12-26 NOTE — Progress Notes (Signed)
Recreation Therapy Notes  Date: 4.8.19 Time: 9:30 a.m. Location: [address] Dayroom   Group Topic: Stress Management   Goal Area(s) Addresses:  Goal 1.1: To reduce stress  -Patient will feel a reduction in stress level  -Patient will learn the importance of stress management  -Patient will participate during stress management group      Intervention: Stress Management   Activity: Meditation- Patients were in a peaceful environment with soft lighting enhancing patients mood. Patients listened to a body scan meditation to help decrease tension and stress levels    Education: Stress Management, Discharge Planning.    Education Outcome: Acknowledges edcuation/In group clarification offered/Needs additional education   Clinical Observations/Feedback:: Patient did not attend     Sheryle Hailarian Tarin Johndrow, Recreation Therapy Intern   Sheryle HailDarian Embry Manrique 12/26/2017 8:30 AM
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
## 2018-01-09 ENCOUNTER — Ambulatory Visit (INDEPENDENT_AMBULATORY_CARE_PROVIDER_SITE_OTHER): Payer: Medicaid Other | Admitting: Psychiatry

## 2018-01-09 ENCOUNTER — Encounter (HOSPITAL_COMMUNITY): Payer: Self-pay | Admitting: Psychiatry

## 2018-01-09 VITALS — BP 130/86 | HR 91 | Ht 64.0 in | Wt 168.0 lb

## 2018-01-09 DIAGNOSIS — F321 Major depressive disorder, single episode, moderate: Secondary | ICD-10-CM | POA: Diagnosis not present

## 2018-01-09 DIAGNOSIS — Z62810 Personal history of physical and sexual abuse in childhood: Secondary | ICD-10-CM

## 2018-01-09 DIAGNOSIS — R450 Nervousness: Secondary | ICD-10-CM

## 2018-01-09 DIAGNOSIS — Z818 Family history of other mental and behavioral disorders: Secondary | ICD-10-CM

## 2018-01-09 DIAGNOSIS — F4001 Agoraphobia with panic disorder: Secondary | ICD-10-CM | POA: Diagnosis not present

## 2018-01-09 DIAGNOSIS — F17210 Nicotine dependence, cigarettes, uncomplicated: Secondary | ICD-10-CM

## 2018-01-09 MED ORDER — HYDROXYZINE HCL 25 MG PO TABS: 25.0000 mg | ORAL_TABLET | Freq: Four times a day (QID) | ORAL | 2 refills | Status: DC | PRN

## 2018-01-09 MED ORDER — VENLAFAXINE HCL ER 75 MG PO CP24: 75.0000 mg | ORAL_CAPSULE | Freq: Every day | ORAL | 0 refills | Status: DC

## 2018-01-09 MED ORDER — MIRTAZAPINE 15 MG PO TABS: 15.0000 mg | ORAL_TABLET | Freq: Every day | ORAL | 2 refills | Status: DC

## 2018-01-09 MED ORDER — TRAZODONE HCL 50 MG PO TABS: 50.0000 mg | ORAL_TABLET | Freq: Every evening | ORAL | 2 refills | Status: DC | PRN
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
## 2018-02-08 ENCOUNTER — Encounter (HOSPITAL_COMMUNITY): Payer: Self-pay | Admitting: Psychiatry

## 2018-02-08 ENCOUNTER — Ambulatory Visit (INDEPENDENT_AMBULATORY_CARE_PROVIDER_SITE_OTHER): Payer: Medicaid Other | Admitting: Psychiatry

## 2018-02-08 VITALS — BP 109/71 | HR 70 | Ht 64.0 in | Wt 172.0 lb

## 2018-02-08 DIAGNOSIS — Z9149 Other personal history of psychological trauma, not elsewhere classified: Secondary | ICD-10-CM | POA: Diagnosis not present

## 2018-02-08 DIAGNOSIS — F321 Major depressive disorder, single episode, moderate: Secondary | ICD-10-CM

## 2018-02-08 DIAGNOSIS — Z818 Family history of other mental and behavioral disorders: Secondary | ICD-10-CM

## 2018-02-08 DIAGNOSIS — F17210 Nicotine dependence, cigarettes, uncomplicated: Secondary | ICD-10-CM | POA: Diagnosis not present

## 2018-02-08 DIAGNOSIS — Z62810 Personal history of physical and sexual abuse in childhood: Secondary | ICD-10-CM

## 2018-02-08 MED ORDER — TRAZODONE HCL 50 MG PO TABS: 50.0000 mg | ORAL_TABLET | Freq: Every evening | ORAL | 2 refills | Status: DC | PRN

## 2018-02-08 MED ORDER — ALPRAZOLAM 1 MG PO TABS: 1.0000 mg | ORAL_TABLET | Freq: Every day | ORAL | 2 refills | Status: DC | PRN

## 2018-02-08 MED ORDER — MIRTAZAPINE 15 MG PO TABS: 15.0000 mg | ORAL_TABLET | Freq: Every day | ORAL | 2 refills | Status: DC

## 2018-02-08 MED ORDER — VENLAFAXINE HCL ER 75 MG PO CP24: 75.0000 mg | ORAL_CAPSULE | Freq: Every day | ORAL | 0 refills | Status: DC
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
## 2018-02-17 ENCOUNTER — Encounter (HOSPITAL_COMMUNITY): Payer: Self-pay | Admitting: Psychiatry

## 2018-02-17 ENCOUNTER — Ambulatory Visit (INDEPENDENT_AMBULATORY_CARE_PROVIDER_SITE_OTHER): Payer: Medicaid Other | Admitting: Psychiatry

## 2018-02-17 DIAGNOSIS — Z62810 Personal history of physical and sexual abuse in childhood: Secondary | ICD-10-CM

## 2018-02-17 DIAGNOSIS — F321 Major depressive disorder, single episode, moderate: Secondary | ICD-10-CM | POA: Diagnosis not present

## 2018-02-17 DIAGNOSIS — Z91410 Personal history of adult physical and sexual abuse: Secondary | ICD-10-CM
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
## 2018-03-27 DIAGNOSIS — Z113 Encounter for screening for infections with a predominantly sexual mode of transmission: Secondary | ICD-10-CM | POA: Diagnosis not present

## 2018-03-27 DIAGNOSIS — Z01419 Encounter for gynecological examination (general) (routine) without abnormal findings: Secondary | ICD-10-CM | POA: Diagnosis not present

## 2018-03-27 DIAGNOSIS — Z0000 Encounter for general adult medical examination without abnormal findings: Secondary | ICD-10-CM | POA: Diagnosis not present

## 2018-04-03 ENCOUNTER — Ambulatory Visit (HOSPITAL_COMMUNITY): Payer: Medicaid Other | Admitting: Psychiatry

## 2018-04-04 DIAGNOSIS — K219 Gastro-esophageal reflux disease without esophagitis: Secondary | ICD-10-CM | POA: Diagnosis not present

## 2018-04-04 DIAGNOSIS — M62838 Other muscle spasm: Secondary | ICD-10-CM | POA: Diagnosis not present

## 2018-04-04 DIAGNOSIS — J45909 Unspecified asthma, uncomplicated: Secondary | ICD-10-CM | POA: Diagnosis not present

## 2018-04-04 DIAGNOSIS — R5382 Chronic fatigue, unspecified: Secondary | ICD-10-CM | POA: Diagnosis not present

## 2018-04-07 ENCOUNTER — Other Ambulatory Visit (HOSPITAL_COMMUNITY): Payer: Self-pay | Admitting: Psychiatry

## 2018-04-11 ENCOUNTER — Ambulatory Visit (INDEPENDENT_AMBULATORY_CARE_PROVIDER_SITE_OTHER): Payer: Medicaid Other | Admitting: Psychiatry

## 2018-04-11 ENCOUNTER — Ambulatory Visit (HOSPITAL_COMMUNITY): Payer: Self-pay | Admitting: Psychiatry

## 2018-04-11 ENCOUNTER — Encounter (HOSPITAL_COMMUNITY): Payer: Self-pay | Admitting: Psychiatry

## 2018-04-11 VITALS — BP 119/78 | HR 112 | Ht 64.0 in | Wt 176.0 lb

## 2018-04-11 DIAGNOSIS — F321 Major depressive disorder, single episode, moderate: Secondary | ICD-10-CM

## 2018-04-11 MED ORDER — ALPRAZOLAM 1 MG PO TABS: 1.0000 mg | ORAL_TABLET | Freq: Every day | ORAL | 2 refills | Status: DC | PRN

## 2018-04-11 MED ORDER — TRAZODONE HCL 50 MG PO TABS: 50.0000 mg | ORAL_TABLET | Freq: Every evening | ORAL | 2 refills | Status: DC | PRN

## 2018-04-11 MED ORDER — VENLAFAXINE HCL ER 75 MG PO CP24: ORAL_CAPSULE | ORAL | 2 refills | Status: DC
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
## 2018-04-14 ENCOUNTER — Ambulatory Visit (INDEPENDENT_AMBULATORY_CARE_PROVIDER_SITE_OTHER): Payer: Medicaid Other | Admitting: Psychiatry

## 2018-04-14 DIAGNOSIS — F321 Major depressive disorder, single episode, moderate: Secondary | ICD-10-CM | POA: Diagnosis not present
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
## 2018-04-27 ENCOUNTER — Ambulatory Visit (INDEPENDENT_AMBULATORY_CARE_PROVIDER_SITE_OTHER): Payer: Medicaid Other | Admitting: Psychiatry

## 2018-04-27 DIAGNOSIS — F321 Major depressive disorder, single episode, moderate: Secondary | ICD-10-CM | POA: Diagnosis not present
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
## 2018-05-04 ENCOUNTER — Ambulatory Visit (INDEPENDENT_AMBULATORY_CARE_PROVIDER_SITE_OTHER): Payer: Medicaid Other | Admitting: Psychiatry

## 2018-05-04 DIAGNOSIS — F321 Major depressive disorder, single episode, moderate: Secondary | ICD-10-CM
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
## 2018-05-16 ENCOUNTER — Ambulatory Visit (HOSPITAL_COMMUNITY): Payer: Medicaid Other | Admitting: Psychiatry

## 2018-05-28 ENCOUNTER — Encounter (HOSPITAL_COMMUNITY): Payer: Self-pay | Admitting: Emergency Medicine

## 2018-05-28 ENCOUNTER — Emergency Department (HOSPITAL_COMMUNITY): Payer: Medicaid Other

## 2018-05-28 ENCOUNTER — Emergency Department (HOSPITAL_COMMUNITY)
Admission: EM | Admit: 2018-05-28 | Discharge: 2018-05-28 | Disposition: A | Discharge status: Home / Self Care | Payer: Medicaid Other | Attending: Emergency Medicine | Admitting: Emergency Medicine

## 2018-05-28 ENCOUNTER — Other Ambulatory Visit: Payer: Self-pay

## 2018-05-28 DIAGNOSIS — S6991XA Unspecified injury of right wrist, hand and finger(s), initial encounter: Secondary | ICD-10-CM | POA: Diagnosis not present

## 2018-05-28 DIAGNOSIS — Y9389 Activity, other specified: Secondary | ICD-10-CM | POA: Insufficient documentation

## 2018-05-28 DIAGNOSIS — F17210 Nicotine dependence, cigarettes, uncomplicated: Secondary | ICD-10-CM | POA: Insufficient documentation

## 2018-05-28 DIAGNOSIS — J45909 Unspecified asthma, uncomplicated: Secondary | ICD-10-CM | POA: Diagnosis not present

## 2018-05-28 DIAGNOSIS — Y999 Unspecified external cause status: Secondary | ICD-10-CM | POA: Insufficient documentation

## 2018-05-28 DIAGNOSIS — Z79899 Other long term (current) drug therapy: Secondary | ICD-10-CM | POA: Diagnosis not present

## 2018-05-28 DIAGNOSIS — Z91040 Latex allergy status: Secondary | ICD-10-CM | POA: Diagnosis not present

## 2018-05-28 DIAGNOSIS — S61216A Laceration without foreign body of right little finger without damage to nail, initial encounter: Secondary | ICD-10-CM | POA: Diagnosis not present

## 2018-05-28 DIAGNOSIS — Y92007 Garden or yard of unspecified non-institutional (private) residence as the place of occurrence of the external cause: Secondary | ICD-10-CM | POA: Insufficient documentation

## 2018-05-28 DIAGNOSIS — W228XXA Striking against or struck by other objects, initial encounter: Secondary | ICD-10-CM | POA: Diagnosis not present

## 2018-05-28 MED ORDER — LIDOCAINE HCL (PF) 2 % IJ SOLN
INTRAMUSCULAR | Status: AC
  Administered 2018-05-28: 23:00:00
  Filled 2018-05-28: qty 10

## 2018-05-28 MED ORDER — POVIDONE-IODINE 10 % EX SOLN
CUTANEOUS | Status: AC
  Administered 2018-05-28: 23:00:00
  Filled 2018-05-28: qty 15

## 2018-05-28 NOTE — ED Provider Notes (Signed)
[facility] EMERGENCY DEPARTMENT Provider Note   CSN: [id number] Arrival date & time: 05/28/18  2129     History   Chief Complaint Chief Complaint  Patient presents with  . Finger Injury    HPI Karen Shields is a 27 y.o. female presenting with laceration to her right 5th finger over the proximal knuckle, stating she accidentally scraped it across a cinder block when playing with her family in the yard.  The injury occurred just prior to arrival.  She has had no treatment prior to arrival. Her tetanus is current.  She has no numbness distal to the injury site and denies other injury.  The history is provided by the patient.    Past Medical History:  Diagnosis Date  . Anxiety   . Asthma    WELL CONTROLLED  . Asthma   . Depression    pt stated  . Eczema 2008  . Gallstones   . GERD (gastroesophageal reflux disease)   . Headache   . Hx MRSA infection 09-2014  . IBS (irritable bowel syndrome)   . Ovarian cyst     Patient Active Problem List   Diagnosis Date Noted  . MDD (major depressive disorder), recurrent episode, severe (HCC) 12/22/2017  . Gallstone   . RUQ pain 12/25/2015  . Gallstones   . Reflux esophagitis   . Nausea without vomiting 11/04/2015  . Cholelithiases 11/04/2015  . GERD (gastroesophageal reflux disease) 11/04/2015  . Early satiety 11/04/2015  . MRSA (methicillin resistant staph aureus) culture positive 12/11/2014  . Menstrual migraine without status migrainosus, not intractable 07/10/2014  . Anxiety 06/14/2013  . Maternal substance abuse 04/14/2013  . H/O cold sores 04/12/2013  . Ovarian cyst, left 02/09/2011    Past Surgical History:  Procedure Laterality Date  . CHOLECYSTECTOMY N/A 01/16/2016   Procedure: LAPAROSCOPIC CHOLECYSTECTOMY;  Surgeon: Lattie Haw, MD;  Location: [facility] ORS;  Service: General;  Laterality: N/A;  . ESOPHAGOGASTRODUODENOSCOPY N/A 11/13/2015   [id number] reflux   . OVARIAN CYST REMOVAL    . OVARIAN CYST REMOVAL   2011  . TUBAL LIGATION    . WISDOM TOOTH EXTRACTION       OB History    Gravida  2   Para  2   Term  1   Preterm  0   AB  0   Living  2     SAB  0   TAB  0   Ectopic  0   Multiple  0   Live Births  1            Home Medications    Prior to Admission medications   Medication Sig Start Date End Date Taking? Authorizing Provider  albuterol (PROVENTIL HFA;VENTOLIN HFA) 108 (90 BASE) MCG/ACT inhaler Inhale 2 puffs into the lungs every 6 (six) hours as needed for wheezing. 11/08/14   Campbell Riches, NP  ALPRAZolam Prudy Feeler) 1 MG tablet Take 1 tablet (1 mg total) by mouth daily as needed. for anxiety 04/11/18   Myrlene Broker, MD  Cholecalciferol (VITAMIN D3) 5000 units CAPS Take by mouth.    [provider]  nicotine polacrilex (NICORETTE) 2 MG gum Take 1 each (2 mg total) by mouth as needed for smoking cessation. 12/26/17   Oneta Rack, NP  Omeprazole (PRILOSEC PO) Take by mouth.    [provider]  traZODone (DESYREL) 50 MG tablet Take 1 tablet (50 mg total) by mouth at bedtime as needed and may repeat dose  one time if needed for sleep. 04/11/18   Myrlene Broker, MD  venlafaxine XR (EFFEXOR-XR) 75 MG 24 hr capsule TAKE 1 CAPSULE BY MOUTH DAILY WITH BREAKFAST. 04/11/18   Myrlene Broker, MD    Family History Family History  Problem Relation Age of Onset  . Diabetes Mother   . Anxiety disorder Mother   . Depression Mother   . Depression Maternal Grandmother   . Colon cancer Neg Hx   . Crohn's disease Neg Hx   . Celiac disease Neg Hx     Social History Social History   Tobacco Use  . Smoking status: Current Every Day Smoker    Packs/day: 1.00    Years: 5.00    Pack years: 5.00    Types: Cigarettes    Start date: 02/11/2006  . Smokeless tobacco: Never Used  . Tobacco comment: Patient is already making efforts, already has access to [employer] , using gum and chantix  Substance Use Topics  . Alcohol use: Yes    Alcohol/week: 0.0  standard drinks    Comment: occassionally, last used in March 2019  . Drug use: No    Types: Benzodiazepines, Opium    Comment: last used marijuana a year ago     Allergies   Latex   Review of Systems Review of Systems  Constitutional: Negative for chills and fever.  HENT: Negative.   Respiratory: Negative for shortness of breath and wheezing.   Musculoskeletal: Positive for arthralgias.  Skin: Positive for wound.  Neurological: Negative for numbness.     Physical Exam Updated Vital Signs BP (!) 131/99   Pulse (!) 106   Temp 98 F (36.7 C) (Oral)   Resp 16   Ht 5\' 3"  (1.6 m)   Wt 77.1 kg   LMP 04/16/2018   SpO2 100%   BMI 30.11 kg/m   Physical Exam  Constitutional: She is oriented to person, place, and time. She appears well-developed and well-nourished.  HENT:  Head: Normocephalic.  Cardiovascular: Normal rate.  Pulmonary/Chest: Effort normal.  Musculoskeletal: She exhibits tenderness.       Hands: 2 cm laceration along the dorsal pip joint of right 5th finger.  Hemostatic.  Distal sensation intact with less than 2 sec cap refill.  Neurological: She is alert and oriented to person, place, and time. No sensory deficit.  Skin: Laceration noted.     ED Treatments / Results  Labs (all labs ordered are listed, but only abnormal results are displayed) Labs Reviewed - No data to display  EKG None  Radiology Dg Finger Little Right  Result Date: 05/28/2018 CLINICAL DATA:  Fall EXAM: RIGHT LITTLE FINGER 2+V COMPARISON:  None. FINDINGS: There is no evidence of fracture or dislocation. There is no evidence of arthropathy or other focal bone abnormality. Soft tissues are unremarkable. IMPRESSION: No fracture or dislocation of the right little finger. Electronically Signed   By: Deatra Robinson M.D.   On: 05/28/2018 22:10    Procedures Procedures (including critical care time)  LACERATION REPAIR Performed by: Burgess Amor Authorized by: Burgess Amor Consent:  Verbal consent obtained. Risks and benefits: risks, benefits and alternatives were discussed Consent given by: patient Patient identity confirmed: provided demographic data Prepped and Draped in normal sterile fashion Wound explored  Laceration Location: right 5th finger  Laceration Length: 2cm  No Foreign Bodies seen or palpated  Anesthesia: digital block  Local anesthetic: lidocaine 2% no epinephrine  Anesthetic total: 1 ml  Irrigation method: syringe Amount  of cleaning: copious using saline after betadine scrub  Skin closure: ethilon 4-0  Number of sutures: 5  Technique: simple interupted  Patient tolerance: Patient tolerated the procedure well with no immediate complications.   Medications Ordered in ED Medications  lidocaine (XYLOCAINE) 2 % injection (  Given by Other 05/28/18 2248)  povidone-iodine (BETADINE) 10 % external solution (  Given by Other 05/28/18 2248)     Initial Impression / Assessment and Plan / ED Course  I have reviewed the triage vital signs and the nursing notes.  Pertinent labs & imaging results that were available during my care of the patient were reviewed by me and considered in my medical decision making (see chart for details).     Wound care instructions given.  Pt advised to have sutures removed in 10 days,  Return here sooner for any signs of infection including redness, swelling, worse pain or drainage of pus.     Final Clinical Impressions(s) / ED Diagnoses   Final diagnoses:  Laceration of right little finger without foreign body without damage to nail, initial encounter    ED Discharge Orders    None       Victoriano Lain 05/28/18 2307    Benjiman Core, MD 05/28/18 2315

## 2018-05-28 NOTE — Discharge Instructions (Addendum)
Have your sutures removed in 10 days.  Keep your wound clean and dry,  Until a good scab forms - you may then wash gently twice daily with mild soap and water, but dry completely after.  Get rechecked for any sign of infection (redness,  Swelling,  Increased pain or drainage of purulent fluid). ° °

## 2018-05-28 NOTE — ED Triage Notes (Signed)
Pt hit knuckle on R pinky finger on a cinder block wall. Pt with laceration to that knuckle. Bleeding controlled at this time.

## 2018-05-30 ENCOUNTER — Ambulatory Visit (INDEPENDENT_AMBULATORY_CARE_PROVIDER_SITE_OTHER): Payer: Medicaid Other | Admitting: Psychiatry

## 2018-05-30 ENCOUNTER — Encounter

## 2018-05-30 DIAGNOSIS — F4310 Post-traumatic stress disorder, unspecified: Secondary | ICD-10-CM

## 2018-05-30 DIAGNOSIS — F321 Major depressive disorder, single episode, moderate: Secondary | ICD-10-CM
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
## 2018-06-05 DIAGNOSIS — M2550 Pain in unspecified joint: Secondary | ICD-10-CM | POA: Diagnosis not present

## 2018-06-05 DIAGNOSIS — R5382 Chronic fatigue, unspecified: Secondary | ICD-10-CM | POA: Diagnosis not present

## 2018-06-05 DIAGNOSIS — J45909 Unspecified asthma, uncomplicated: Secondary | ICD-10-CM | POA: Diagnosis not present

## 2018-06-05 DIAGNOSIS — K219 Gastro-esophageal reflux disease without esophagitis: Secondary | ICD-10-CM | POA: Diagnosis not present

## 2018-06-06 ENCOUNTER — Ambulatory Visit (HOSPITAL_COMMUNITY): Payer: Medicaid Other | Admitting: Psychiatry

## 2018-06-07 ENCOUNTER — Telehealth (HOSPITAL_COMMUNITY): Payer: Self-pay | Admitting: Psychiatry

## 2018-06-08 NOTE — Telephone Encounter (Signed)
Returned call to patient.  No answer, left message.

## 2018-06-11 ENCOUNTER — Emergency Department (HOSPITAL_COMMUNITY)
Admission: EM | Admit: 2018-06-11 | Discharge: 2018-06-12 | Disposition: A | Discharge status: Home / Self Care | Payer: Medicaid Other | Attending: Emergency Medicine | Admitting: Emergency Medicine

## 2018-06-11 ENCOUNTER — Other Ambulatory Visit: Payer: Self-pay

## 2018-06-11 ENCOUNTER — Encounter (HOSPITAL_COMMUNITY): Payer: Self-pay | Admitting: Emergency Medicine

## 2018-06-11 ENCOUNTER — Emergency Department (HOSPITAL_COMMUNITY): Payer: Medicaid Other

## 2018-06-11 DIAGNOSIS — R509 Fever, unspecified: Secondary | ICD-10-CM | POA: Diagnosis present

## 2018-06-11 DIAGNOSIS — R05 Cough: Secondary | ICD-10-CM | POA: Diagnosis not present

## 2018-06-11 DIAGNOSIS — F17210 Nicotine dependence, cigarettes, uncomplicated: Secondary | ICD-10-CM | POA: Insufficient documentation

## 2018-06-11 DIAGNOSIS — Z79899 Other long term (current) drug therapy: Secondary | ICD-10-CM | POA: Diagnosis not present

## 2018-06-11 DIAGNOSIS — J45909 Unspecified asthma, uncomplicated: Secondary | ICD-10-CM | POA: Diagnosis not present

## 2018-06-11 DIAGNOSIS — J189 Pneumonia, unspecified organism: Secondary | ICD-10-CM | POA: Insufficient documentation

## 2018-06-11 LAB — URINALYSIS, ROUTINE W REFLEX MICROSCOPIC
BACTERIA UA: NONE SEEN
BILIRUBIN URINE: NEGATIVE
Glucose, UA: NEGATIVE mg/dL
Ketones, ur: NEGATIVE mg/dL
NITRITE: NEGATIVE
PH: 5 (ref 5.0–8.0)
Protein, ur: NEGATIVE mg/dL
SPECIFIC GRAVITY, URINE: 1.017 (ref 1.005–1.030)

## 2018-06-11 LAB — I-STAT CG4 LACTIC ACID, ED: Lactic Acid, Venous: 1.62 mmol/L (ref 0.5–1.9)

## 2018-06-11 LAB — COMPREHENSIVE METABOLIC PANEL
ALBUMIN: 4 g/dL (ref 3.5–5.0)
ALK PHOS: 97 U/L (ref 38–126)
ALT: 23 U/L (ref 0–44)
AST: 27 U/L (ref 15–41)
Anion gap: 9 (ref 5–15)
BILIRUBIN TOTAL: 0.6 mg/dL (ref 0.3–1.2)
BUN: 7 mg/dL (ref 6–20)
CALCIUM: 8.9 mg/dL (ref 8.9–10.3)
CO2: 22 mmol/L (ref 22–32)
CREATININE: 0.69 mg/dL (ref 0.44–1.00)
Chloride: 104 mmol/L (ref 98–111)
GFR calc Af Amer: >60 mL/min (ref >60)
GFR calc non Af Amer: >60 mL/min (ref >60)
GLUCOSE: 120 mg/dL — AB (ref 70–99)
Potassium: 3.2 mmol/L — ABNORMAL LOW (ref 3.5–5.1)
SODIUM: 135 mmol/L (ref 135–145)
Total Protein: 7.2 g/dL (ref 6.5–8.1)

## 2018-06-11 LAB — CBC WITH DIFFERENTIAL/PLATELET
BASOS PCT: 0 %
Basophils Absolute: 0 10*3/uL (ref 0.0–0.1)
EOS ABS: 0.3 10*3/uL (ref 0.0–0.7)
EOS PCT: 2 %
HCT: 38.7 % (ref 36.0–46.0)
HEMOGLOBIN: 13.1 g/dL (ref 12.0–15.0)
LYMPHS ABS: 2.2 10*3/uL (ref 0.7–4.0)
Lymphocytes Relative: 12 %
MCH: 29.6 pg (ref 26.0–34.0)
MCHC: 33.9 g/dL (ref 30.0–36.0)
MCV: 87.6 fL (ref 78.0–100.0)
MONOS PCT: 6 %
Monocytes Absolute: 1.2 10*3/uL — ABNORMAL HIGH (ref 0.1–1.0)
Neutro Abs: 15.3 10*3/uL — ABNORMAL HIGH (ref 1.7–7.7)
Neutrophils Relative %: 80 %
PLATELETS: 322 10*3/uL (ref 150–400)
RBC: 4.42 MIL/uL (ref 3.87–5.11)
RDW: 12.9 % (ref 11.5–15.5)
WBC: 19 10*3/uL — ABNORMAL HIGH (ref 4.0–10.5)

## 2018-06-11 LAB — PROTIME-INR
INR: 1.02
PROTHROMBIN TIME: 13.3 s (ref 11.4–15.2)

## 2018-06-11 LAB — PREGNANCY, URINE: PREG TEST UR: NEGATIVE

## 2018-06-11 MED ORDER — SODIUM CHLORIDE 0.9 % IV SOLN
2.0000 g | INTRAVENOUS | Status: DC
  Administered 2018-06-12: 2 g via INTRAVENOUS
  Filled 2018-06-11: qty 20

## 2018-06-11 MED ORDER — METOCLOPRAMIDE HCL 5 MG/ML IJ SOLN
10.0000 mg | Freq: Once | INTRAMUSCULAR | Status: AC
  Administered 2018-06-11: 10 mg via INTRAVENOUS
  Filled 2018-06-11: qty 2

## 2018-06-11 MED ORDER — DIPHENHYDRAMINE HCL 50 MG/ML IJ SOLN
25.0000 mg | Freq: Once | INTRAMUSCULAR | Status: AC
  Administered 2018-06-11: 25 mg via INTRAVENOUS
  Filled 2018-06-11: qty 1

## 2018-06-11 MED ORDER — SODIUM CHLORIDE 0.9 % IV SOLN
500.0000 mg | INTRAVENOUS | Status: DC
  Administered 2018-06-12: 500 mg via INTRAVENOUS
  Filled 2018-06-11: qty 500

## 2018-06-11 MED ORDER — ACETAMINOPHEN 325 MG PO TABS
650.0000 mg | ORAL_TABLET | Freq: Once | ORAL | Status: AC
  Administered 2018-06-11: 650 mg via ORAL

## 2018-06-11 MED ORDER — POTASSIUM CHLORIDE CRYS ER 20 MEQ PO TBCR
40.0000 meq | EXTENDED_RELEASE_TABLET | Freq: Once | ORAL | Status: AC
  Administered 2018-06-12: 40 meq via ORAL
  Filled 2018-06-11: qty 2

## 2018-06-11 MED ORDER — ACETAMINOPHEN 325 MG PO TABS
ORAL_TABLET | ORAL | Status: AC
  Filled 2018-06-11: qty 2

## 2018-06-11 MED ORDER — SODIUM CHLORIDE 0.9 % IV BOLUS
1000.0000 mL | Freq: Once | INTRAVENOUS | Status: AC
  Administered 2018-06-11: 1000 mL via INTRAVENOUS

## 2018-06-11 NOTE — ED Provider Notes (Signed)
[facility] EMERGENCY DEPARTMENT Provider Note   CSN: [id number] Arrival date & time: 06/11/18  2230     History   Chief Complaint Chief Complaint  Patient presents with  . Fever    HPI Karen Shields is a 27 y.o. female.  The history is provided by the patient.  Fever    She has history of asthma, irritable bowel syndrome, anxiety and comes in with fever and chills which started today.  She started having malaise yesterday and started taking an antibiotic for a urinary tract infection, even though she did not have any urinary symptoms.  She started running a fever tonight and temperature was 104.9 at home.  She has not taken anything for the fever.  She denies sweats.  She has had cough productive of a small amount of clear sputum.  She is also complaining of a sore throat.  There is nausea but no vomiting.  She denies urinary urgency, frequency, tenesmus, dysuria.  She denies arthralgias or myalgias.  She is complaining of moderate headache.  She denies sick contacts.  She is prone to urinary tract infections and her physician gives her a prescription for to take as needed.  Past Medical History:  Diagnosis Date  . Anxiety   . Asthma    WELL CONTROLLED  . Asthma   . Depression    pt stated  . Eczema 2008  . Gallstones   . GERD (gastroesophageal reflux disease)   . Headache   . Hx MRSA infection 09-2014  . IBS (irritable bowel syndrome)   . Ovarian cyst     Patient Active Problem List   Diagnosis Date Noted  . MDD (major depressive disorder), recurrent episode, severe (HCC) 12/22/2017  . Gallstone   . RUQ pain 12/25/2015  . Gallstones   . Reflux esophagitis   . Nausea without vomiting 11/04/2015  . Cholelithiases 11/04/2015  . GERD (gastroesophageal reflux disease) 11/04/2015  . Early satiety 11/04/2015  . MRSA (methicillin resistant staph aureus) culture positive 12/11/2014  . Menstrual migraine without status migrainosus, not intractable 07/10/2014  . Anxiety  06/14/2013  . Maternal substance abuse 04/14/2013  . H/O cold sores 04/12/2013  . Ovarian cyst, left 02/09/2011    Past Surgical History:  Procedure Laterality Date  . CHOLECYSTECTOMY N/A 01/16/2016   Procedure: LAPAROSCOPIC CHOLECYSTECTOMY;  Surgeon: Lattie Haw, MD;  Location: [facility] ORS;  Service: General;  Laterality: N/A;  . ESOPHAGOGASTRODUODENOSCOPY N/A 11/13/2015   [id number] reflux   . OVARIAN CYST REMOVAL    . OVARIAN CYST REMOVAL  2011  . TUBAL LIGATION    . WISDOM TOOTH EXTRACTION       OB History    Gravida  2   Para  2   Term  1   Preterm  0   AB  0   Living  2     SAB  0   TAB  0   Ectopic  0   Multiple  0   Live Births  1            Home Medications    Prior to Admission medications   Medication Sig Start Date End Date Taking? Authorizing Provider  albuterol (PROVENTIL HFA;VENTOLIN HFA) 108 (90 BASE) MCG/ACT inhaler Inhale 2 puffs into the lungs every 6 (six) hours as needed for wheezing. 11/08/14   Campbell Riches, NP  ALPRAZolam Prudy Feeler) 1 MG tablet Take 1 tablet (1 mg total) by mouth daily as needed. for anxiety 04/11/18  Myrlene Brokeross, Deborah R, MD  Cholecalciferol (VITAMIN D3) 5000 units CAPS Take by mouth.    [provider]  nicotine polacrilex (NICORETTE) 2 MG gum Take 1 each (2 mg total) by mouth as needed for smoking cessation. 12/26/17   Oneta RackLewis, Tanika N, NP  Omeprazole (PRILOSEC PO) Take by mouth.    [provider]  traZODone (DESYREL) 50 MG tablet Take 1 tablet (50 mg total) by mouth at bedtime as needed and may repeat dose one time if needed for sleep. 04/11/18   Myrlene Brokeross, Deborah R, MD  venlafaxine XR (EFFEXOR-XR) 75 MG 24 hr capsule TAKE 1 CAPSULE BY MOUTH DAILY WITH BREAKFAST. 04/11/18   Myrlene Brokeross, Deborah R, MD    Family History Family History  Problem Relation Age of Onset  . Diabetes Mother   . Anxiety disorder Mother   . Depression Mother   . Depression Maternal Grandmother   . Colon cancer Neg Hx   . Crohn's  disease Neg Hx   . Celiac disease Neg Hx     Social History Social History   Tobacco Use  . Smoking status: Current Every Day Smoker    Packs/day: 1.00    Years: 5.00    Pack years: 5.00    Types: Cigarettes    Start date: 02/11/2006  . Smokeless tobacco: Never Used  . Tobacco comment: Patient is already making efforts, already has access to [employer] , using gum and chantix  Substance Use Topics  . Alcohol use: Yes    Alcohol/week: 0.0 standard drinks    Comment: occassionally, last used in March 2019  . Drug use: No    Types: Benzodiazepines, Opium    Comment: last used marijuana a year ago     Allergies   Latex   Review of Systems Review of Systems  Constitutional: Positive for fever.  All other systems reviewed and are negative.    Physical Exam Updated Vital Signs BP (!) 156/137 (BP Location: Right Arm)   Pulse (!) 138   Temp (!) 102.9 F (39.4 C) (Oral)   Resp 20   Ht 5\' 3"  (1.6 m)   Wt 77.1 kg   LMP 06/11/2018   SpO2 97%   BMI 30.11 kg/m   Physical Exam  Nursing note and vitals reviewed.  27 year old female, resting comfortably and in no acute distress. Vital signs are significant for elevated heart rate and blood pressure and temperature. Oxygen saturation is 97%, which is normal. Head is normocephalic and atraumatic. PERRLA, EOMI. Oropharynx is clear. Neck is nontender and supple without adenopathy or JVD. Back is nontender and there is no CVA tenderness. Lungs are clear without rales, wheezes, or rhonchi. Chest is nontender. Heart is tachycardic without murmur. Abdomen is soft, flat, nontender without masses or hepatosplenomegaly and peristalsis is hypoactive. Extremities have no cyanosis or edema, full range of motion is present. Skin is warm and dry without rash. Neurologic: Mental status is normal, cranial nerves are intact, there are no motor or sensory deficits.  ED Treatments / Results  Labs (all labs ordered are listed, but only  abnormal results are displayed) Labs Reviewed  CULTURE, BLOOD (ROUTINE X 2)  CULTURE, BLOOD (ROUTINE X 2)  COMPREHENSIVE METABOLIC PANEL  CBC WITH DIFFERENTIAL/PLATELET  PROTIME-INR  URINALYSIS, ROUTINE W REFLEX MICROSCOPIC  PREGNANCY, URINE  I-STAT CG4 LACTIC ACID, ED   Radiology No results found.  Procedures Procedures   Medications Ordered in ED Medications - No data to display   Initial Impression /  Assessment and Plan / ED Course  I have reviewed the triage vital signs and the nursing notes.  Pertinent labs & imaging results that were available during my care of the patient were reviewed by me and considered in my medical decision making (see chart for details).  Fever with source of infection unclear, possible upper respiratory infection, possible occult urinary tract infection.  Old records are reviewed, and she does have prior ED visits for UTIs.  Lactic acid level has come back normal.  She will be given IV fluids, acetaminophen, metoclopramide, diphenhydramine.  Sepsis evaluation is underway.  Will hold off on starting antibiotics until further work-up has been completed since this may be a viral illness.  11:50 PM Lactic acid level is normal.  WBC is significantly elevated with slight left shift.  Urinalysis shows no evidence of UTI.  Chest x-ray is consistent with pneumonia, so she is started on antibiotics for community-acquired pneumonia.  Metabolic panel is significant for mild hypokalemia and she is given a dose of oral potassium.  2:10 AM She feels better after above-noted treatment.  She feels well enough to go home.  She is maintaining adequate oxygen saturations.  Heart rate has come down to just over 100.  She is nontoxic in appearance and I feel she is safe for discharge.  She is discharged with prescription for doxycycline which should treat both typical and atypical pneumonia.  Advised to take acetaminophen and/or ibuprofen for fever and aching.  Follow-up with  PCP after completion of course of antibiotics.  Return precautions discussed.  Final Clinical Impressions(s) / ED Diagnoses   Final diagnoses:  Community acquired pneumonia, unspecified laterality    ED Discharge Orders         Ordered    doxycycline (VIBRAMYCIN) 100 MG capsule  2 times daily     06/12/18 0206           Dione Booze, MD 06/12/18 [phone]

## 2018-06-11 NOTE — ED Triage Notes (Signed)
Pt states she had a 104.9 oral fever at home, no OTC medication. Has had cough and states she might have a UTI, denies dysuria, polyuria.

## 2018-06-12 LAB — LACTIC ACID, PLASMA: Lactic Acid, Venous: 1.8 mmol/L (ref 0.5–1.9)

## 2018-06-12 MED ORDER — DOXYCYCLINE HYCLATE 100 MG PO CAPS: 100.0000 mg | ORAL_CAPSULE | Freq: Two times a day (BID) | ORAL | 0 refills | Status: DC

## 2018-06-12 MED ORDER — SODIUM CHLORIDE 0.9 % IV BOLUS
1000.0000 mL | Freq: Once | INTRAVENOUS | Status: AC
  Administered 2018-06-12: 1000 mL via INTRAVENOUS

## 2018-06-12 NOTE — Discharge Instructions (Signed)
Drink plenty of fluids. ? ?Take acetaminophen and/or ibuprofen as needed for fever or aching. ? ?Return if symptoms are getting worse. ?

## 2018-06-13 ENCOUNTER — Ambulatory Visit (INDEPENDENT_AMBULATORY_CARE_PROVIDER_SITE_OTHER): Payer: Medicaid Other | Admitting: Psychiatry

## 2018-06-13 ENCOUNTER — Encounter (HOSPITAL_COMMUNITY): Payer: Self-pay | Admitting: Psychiatry

## 2018-06-13 VITALS — BP 114/70 | HR 112 | Ht 63.0 in | Wt 175.0 lb

## 2018-06-13 DIAGNOSIS — F321 Major depressive disorder, single episode, moderate: Secondary | ICD-10-CM | POA: Diagnosis not present

## 2018-06-13 DIAGNOSIS — R5383 Other fatigue: Secondary | ICD-10-CM | POA: Diagnosis not present

## 2018-06-13 DIAGNOSIS — D510 Vitamin B12 deficiency anemia due to intrinsic factor deficiency: Secondary | ICD-10-CM | POA: Diagnosis not present

## 2018-06-13 DIAGNOSIS — F4001 Agoraphobia with panic disorder: Secondary | ICD-10-CM | POA: Diagnosis not present

## 2018-06-13 DIAGNOSIS — F17210 Nicotine dependence, cigarettes, uncomplicated: Secondary | ICD-10-CM | POA: Diagnosis not present

## 2018-06-13 DIAGNOSIS — E7849 Other hyperlipidemia: Secondary | ICD-10-CM | POA: Diagnosis not present

## 2018-06-13 DIAGNOSIS — E559 Vitamin D deficiency, unspecified: Secondary | ICD-10-CM | POA: Diagnosis not present

## 2018-06-13 DIAGNOSIS — I110 Hypertensive heart disease with heart failure: Secondary | ICD-10-CM | POA: Diagnosis not present

## 2018-06-13 DIAGNOSIS — N390 Urinary tract infection, site not specified: Secondary | ICD-10-CM | POA: Diagnosis not present

## 2018-06-13 MED ORDER — ALPRAZOLAM 1 MG PO TABS: 1.0000 mg | ORAL_TABLET | Freq: Every day | ORAL | 2 refills | Status: DC | PRN

## 2018-06-13 MED ORDER — TRAZODONE HCL 50 MG PO TABS: 50.0000 mg | ORAL_TABLET | Freq: Every evening | ORAL | 2 refills | Status: DC | PRN

## 2018-06-13 MED ORDER — VENLAFAXINE HCL ER 75 MG PO CP24: ORAL_CAPSULE | ORAL | 2 refills | Status: DC
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
## 2018-06-15 ENCOUNTER — Ambulatory Visit (INDEPENDENT_AMBULATORY_CARE_PROVIDER_SITE_OTHER): Payer: Medicaid Other | Admitting: Psychiatry

## 2018-06-15 DIAGNOSIS — F321 Major depressive disorder, single episode, moderate: Secondary | ICD-10-CM

## 2018-06-15 DIAGNOSIS — F4310 Post-traumatic stress disorder, unspecified: Secondary | ICD-10-CM
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
## 2018-06-16 LAB — CULTURE, BLOOD (ROUTINE X 2)
CULTURE: NO GROWTH
Culture: NO GROWTH
SPECIAL REQUESTS: ADEQUATE
Special Requests: ADEQUATE

## 2018-06-20 DIAGNOSIS — J188 Other pneumonia, unspecified organism: Secondary | ICD-10-CM | POA: Diagnosis not present

## 2018-06-20 DIAGNOSIS — J111 Influenza due to unidentified influenza virus with other respiratory manifestations: Secondary | ICD-10-CM | POA: Diagnosis not present

## 2018-06-20 DIAGNOSIS — R110 Nausea: Secondary | ICD-10-CM | POA: Diagnosis not present

## 2018-07-04 ENCOUNTER — Ambulatory Visit (HOSPITAL_COMMUNITY): Payer: Self-pay | Admitting: Psychiatry

## 2018-07-05 ENCOUNTER — Ambulatory Visit (INDEPENDENT_AMBULATORY_CARE_PROVIDER_SITE_OTHER): Payer: Medicaid Other | Admitting: Psychiatry

## 2018-07-05 DIAGNOSIS — F4310 Post-traumatic stress disorder, unspecified: Secondary | ICD-10-CM | POA: Diagnosis not present

## 2018-07-05 DIAGNOSIS — F321 Major depressive disorder, single episode, moderate: Secondary | ICD-10-CM | POA: Diagnosis not present
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
## 2018-07-18 ENCOUNTER — Encounter

## 2018-07-18 ENCOUNTER — Ambulatory Visit (INDEPENDENT_AMBULATORY_CARE_PROVIDER_SITE_OTHER): Payer: Medicaid Other | Admitting: Psychiatry

## 2018-07-18 DIAGNOSIS — F321 Major depressive disorder, single episode, moderate: Secondary | ICD-10-CM

## 2018-07-18 DIAGNOSIS — F4310 Post-traumatic stress disorder, unspecified: Secondary | ICD-10-CM | POA: Diagnosis not present
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
## 2018-07-31 ENCOUNTER — Ambulatory Visit (HOSPITAL_COMMUNITY): Payer: Self-pay | Admitting: Psychiatry

## 2018-08-04 DIAGNOSIS — J45909 Unspecified asthma, uncomplicated: Secondary | ICD-10-CM | POA: Diagnosis not present

## 2018-08-04 DIAGNOSIS — M62838 Other muscle spasm: Secondary | ICD-10-CM | POA: Diagnosis not present

## 2018-08-04 DIAGNOSIS — R5382 Chronic fatigue, unspecified: Secondary | ICD-10-CM | POA: Diagnosis not present

## 2018-08-09 DIAGNOSIS — R945 Abnormal results of liver function studies: Secondary | ICD-10-CM | POA: Diagnosis not present

## 2018-08-09 DIAGNOSIS — Z1159 Encounter for screening for other viral diseases: Secondary | ICD-10-CM | POA: Diagnosis not present

## 2018-08-14 ENCOUNTER — Ambulatory Visit (INDEPENDENT_AMBULATORY_CARE_PROVIDER_SITE_OTHER): Payer: Medicaid Other | Admitting: Psychiatry

## 2018-08-14 DIAGNOSIS — F4310 Post-traumatic stress disorder, unspecified: Secondary | ICD-10-CM | POA: Diagnosis not present

## 2018-08-14 DIAGNOSIS — F321 Major depressive disorder, single episode, moderate: Secondary | ICD-10-CM | POA: Diagnosis not present
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
## 2018-08-15 ENCOUNTER — Ambulatory Visit (INDEPENDENT_AMBULATORY_CARE_PROVIDER_SITE_OTHER): Payer: Medicaid Other | Admitting: Psychiatry

## 2018-08-15 ENCOUNTER — Encounter (HOSPITAL_COMMUNITY): Payer: Self-pay | Admitting: Psychiatry

## 2018-08-15 VITALS — BP 135/83 | HR 117 | Ht 63.0 in | Wt 176.0 lb

## 2018-08-15 DIAGNOSIS — F321 Major depressive disorder, single episode, moderate: Secondary | ICD-10-CM

## 2018-08-15 MED ORDER — ALPRAZOLAM 1 MG PO TABS: 1.0000 mg | ORAL_TABLET | Freq: Every day | ORAL | 2 refills | Status: DC | PRN

## 2018-08-15 MED ORDER — VORTIOXETINE HBR 10 MG PO TABS: 10.0000 mg | ORAL_TABLET | Freq: Every day | ORAL | 2 refills | Status: DC

## 2018-08-15 MED ORDER — VENLAFAXINE HCL ER 37.5 MG PO CP24: 37.5000 mg | ORAL_CAPSULE | Freq: Every day | ORAL | 0 refills | Status: DC
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
## 2018-08-18 DIAGNOSIS — R1110 Vomiting, unspecified: Secondary | ICD-10-CM | POA: Diagnosis not present

## 2018-08-18 DIAGNOSIS — E860 Dehydration: Secondary | ICD-10-CM | POA: Diagnosis not present

## 2018-08-28 ENCOUNTER — Ambulatory Visit (INDEPENDENT_AMBULATORY_CARE_PROVIDER_SITE_OTHER): Payer: Medicaid Other | Admitting: Psychiatry

## 2018-08-28 DIAGNOSIS — F321 Major depressive disorder, single episode, moderate: Secondary | ICD-10-CM
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
## 2018-09-04 DIAGNOSIS — R1084 Generalized abdominal pain: Secondary | ICD-10-CM | POA: Diagnosis not present

## 2018-09-04 DIAGNOSIS — M62838 Other muscle spasm: Secondary | ICD-10-CM | POA: Diagnosis not present

## 2018-09-04 DIAGNOSIS — K219 Gastro-esophageal reflux disease without esophagitis: Secondary | ICD-10-CM | POA: Diagnosis not present

## 2018-09-18 ENCOUNTER — Ambulatory Visit (HOSPITAL_COMMUNITY): Payer: Self-pay | Admitting: Psychiatry

## 2018-09-19 ENCOUNTER — Ambulatory Visit (HOSPITAL_COMMUNITY): Payer: Medicaid Other | Admitting: Psychiatry

## 2018-10-02 ENCOUNTER — Ambulatory Visit (HOSPITAL_COMMUNITY): Payer: Self-pay | Admitting: Psychiatry

## 2018-10-02 DIAGNOSIS — M2550 Pain in unspecified joint: Secondary | ICD-10-CM | POA: Diagnosis not present

## 2018-10-02 DIAGNOSIS — R5382 Chronic fatigue, unspecified: Secondary | ICD-10-CM | POA: Diagnosis not present

## 2018-10-09 ENCOUNTER — Emergency Department (HOSPITAL_COMMUNITY)
Admission: EM | Admit: 2018-10-09 | Discharge: 2018-10-09 | Disposition: A | Discharge status: Home / Self Care | Payer: Medicaid Other | Attending: Emergency Medicine | Admitting: Emergency Medicine

## 2018-10-09 ENCOUNTER — Encounter (HOSPITAL_COMMUNITY): Payer: Self-pay

## 2018-10-09 ENCOUNTER — Other Ambulatory Visit: Payer: Self-pay

## 2018-10-09 DIAGNOSIS — J45909 Unspecified asthma, uncomplicated: Secondary | ICD-10-CM | POA: Insufficient documentation

## 2018-10-09 DIAGNOSIS — Z79899 Other long term (current) drug therapy: Secondary | ICD-10-CM | POA: Diagnosis not present

## 2018-10-09 DIAGNOSIS — F17210 Nicotine dependence, cigarettes, uncomplicated: Secondary | ICD-10-CM | POA: Diagnosis not present

## 2018-10-09 DIAGNOSIS — J069 Acute upper respiratory infection, unspecified: Secondary | ICD-10-CM

## 2018-10-09 DIAGNOSIS — R05 Cough: Secondary | ICD-10-CM | POA: Diagnosis not present

## 2018-10-09 DIAGNOSIS — B9789 Other viral agents as the cause of diseases classified elsewhere: Secondary | ICD-10-CM | POA: Diagnosis not present

## 2018-10-09 MED ORDER — PSEUDOEPHEDRINE HCL 60 MG PO TABS
60.0000 mg | ORAL_TABLET | Freq: Once | ORAL | Status: AC
  Administered 2018-10-09: 60 mg via ORAL
  Filled 2018-10-09: qty 1

## 2018-10-09 MED ORDER — DEXAMETHASONE 4 MG PO TABS: 4.0000 mg | ORAL_TABLET | Freq: Two times a day (BID) | ORAL | 0 refills | Status: DC

## 2018-10-09 MED ORDER — PREDNISONE 20 MG PO TABS
40.0000 mg | ORAL_TABLET | Freq: Once | ORAL | Status: AC
  Administered 2018-10-09: 40 mg via ORAL
  Filled 2018-10-09: qty 2

## 2018-10-09 MED ORDER — LORATADINE-PSEUDOEPHEDRINE ER 5-120 MG PO TB12: 1.0000 | ORAL_TABLET | Freq: Two times a day (BID) | ORAL | 0 refills | Status: DC

## 2018-10-09 NOTE — ED Provider Notes (Signed)
[facility] EMERGENCY DEPARTMENT Provider Note   CSN: [id number] Arrival date & time: 10/09/18  1614     History   Chief Complaint Chief Complaint  Patient presents with  . Cough    HPI Karen Shields is a 28 y.o. female.  The history is provided by the patient.  Cough  Cough characteristics:  Productive Sputum characteristics:  Nondescript Severity:  Moderate Onset quality:  Gradual Duration:  2 weeks Timing:  Intermittent Progression:  Worsening Chronicity:  New Smoker: yes   Context: sick contacts   Relieved by:  Nothing Worsened by:  Nothing Ineffective treatments: OTC cough meds. Associated symptoms: ear pain   Associated symptoms: no chest pain, no eye discharge, no shortness of breath and no wheezing   Associated symptoms comment:  Dizziness Risk factors: no recent travel     Past Medical History:  Diagnosis Date  . Anxiety   . Asthma    WELL CONTROLLED  . Asthma   . Depression    pt stated  . Eczema 2008  . Gallstones   . GERD (gastroesophageal reflux disease)   . Headache   . Hx MRSA infection 09-2014  . IBS (irritable bowel syndrome)   . Ovarian cyst     Patient Active Problem List   Diagnosis Date Noted  . MDD (major depressive disorder), recurrent episode, severe (HCC) 12/22/2017  . Gallstone   . RUQ pain 12/25/2015  . Gallstones   . Reflux esophagitis   . Nausea without vomiting 11/04/2015  . Cholelithiases 11/04/2015  . GERD (gastroesophageal reflux disease) 11/04/2015  . Early satiety 11/04/2015  . MRSA (methicillin resistant staph aureus) culture positive 12/11/2014  . Menstrual migraine without status migrainosus, not intractable 07/10/2014  . Anxiety 06/14/2013  . Maternal substance abuse 04/14/2013  . H/O cold sores 04/12/2013  . Ovarian cyst, left 02/09/2011    Past Surgical History:  Procedure Laterality Date  . CHOLECYSTECTOMY N/A 01/16/2016   Procedure: LAPAROSCOPIC CHOLECYSTECTOMY;  Surgeon: Lattie Hawichard E Cooper, MD;   Location: [facility] ORS;  Service: General;  Laterality: N/A;  . ESOPHAGOGASTRODUODENOSCOPY N/A 11/13/2015   WUJ:[id number]:erosive reflux   . OVARIAN CYST REMOVAL    . OVARIAN CYST REMOVAL  2011  . TUBAL LIGATION    . WISDOM TOOTH EXTRACTION       OB History    Gravida  2   Para  2   Term  1   Preterm  0   AB  0   Living  2     SAB  0   TAB  0   Ectopic  0   Multiple  0   Live Births  1            Home Medications    Prior to Admission medications   Medication Sig Start Date End Date Taking? Authorizing Provider  albuterol (PROVENTIL HFA;VENTOLIN HFA) 108 (90 BASE) MCG/ACT inhaler Inhale 2 puffs into the lungs every 6 (six) hours as needed for wheezing. 11/08/14   Campbell RichesHoskins, Carolyn C, NP  ALPRAZolam Prudy Feeler(XANAX) 1 MG tablet Take 1 tablet (1 mg total) by mouth daily as needed. for anxiety 08/15/18   Myrlene Brokeross, Deborah R, MD  Cholecalciferol (VITAMIN D3) 5000 units CAPS Take by mouth.    [provider]  doxycycline (VIBRAMYCIN) 100 MG capsule Take 1 capsule (100 mg total) by mouth 2 (two) times daily. 06/12/18   Dione BoozeGlick, David, MD  nicotine polacrilex (NICORETTE) 2 MG gum Take 1 each (2 mg total) by mouth as  needed for smoking cessation. 12/26/17   Oneta RackLewis, Tanika N, NP  Omeprazole (PRILOSEC PO) Take by mouth.    [provider]  venlafaxine XR (EFFEXOR XR) 37.5 MG 24 hr capsule Take 1 capsule (37.5 mg total) by mouth daily. 08/15/18 08/15/19  Myrlene Brokeross, Deborah R, MD  vortioxetine HBr (TRINTELLIX) 10 MG TABS tablet Take 1 tablet (10 mg total) by mouth daily. 08/15/18   Myrlene Brokeross, Deborah R, MD    Family History Family History  Problem Relation Age of Onset  . Diabetes Mother   . Anxiety disorder Mother   . Depression Mother   . Depression Maternal Grandmother   . Colon cancer Neg Hx   . Crohn's disease Neg Hx   . Celiac disease Neg Hx     Social History Social History   Tobacco Use  . Smoking status: Current Every Day Smoker    Packs/day: 0.50    Years: 5.00    Pack  years: 2.50    Types: Cigarettes    Start date: 02/11/2006  . Smokeless tobacco: Never Used  . Tobacco comment: Patient is already making efforts, already has access to [employer] , using gum and chantix  Substance Use Topics  . Alcohol use: Yes    Alcohol/week: 0.0 standard drinks    Comment: occassionally, last used in March 2019  . Drug use: Yes    Types: Benzodiazepines, Opium    Comment: occassional     Allergies   Latex   Review of Systems Review of Systems  Constitutional: Negative for activity change.       All ROS Neg except as noted in HPI  HENT: Positive for congestion and ear pain. Negative for nosebleeds.   Eyes: Negative for photophobia and discharge.  Respiratory: Positive for cough. Negative for shortness of breath and wheezing.   Cardiovascular: Negative for chest pain and palpitations.  Gastrointestinal: Negative for abdominal pain and blood in stool.  Genitourinary: Negative for dysuria, frequency and hematuria.  Musculoskeletal: Negative for arthralgias, back pain and neck pain.  Skin: Negative.   Neurological: Positive for dizziness. Negative for seizures and speech difficulty.  Psychiatric/Behavioral: Negative for confusion and hallucinations.     Physical Exam Updated Vital Signs BP 131/90 (BP Location: Right Arm)   Pulse (!) 102   Temp 98.4 F (36.9 C) (Oral)   Resp 17   Ht 5\' 3"  (1.6 m)   Wt 79.4 kg   LMP 10/03/2018   SpO2 100%   BMI 31.00 kg/m   Physical Exam Vitals signs and nursing note reviewed.  Constitutional:      Appearance: She is well-developed. She is not toxic-appearing.  HENT:     Head: Normocephalic.     Right Ear: Tympanic membrane and external ear normal.     Left Ear: Tympanic membrane and external ear normal.     Nose: Congestion present.  Eyes:     General: Lids are normal.     Pupils: Pupils are equal, round, and reactive to light.  Neck:     Musculoskeletal: Normal range of motion and neck supple.      Vascular: No carotid bruit.  Cardiovascular:     Rate and Rhythm: Regular rhythm. Tachycardia present.     Pulses: Normal pulses.     Heart sounds: Normal heart sounds. No murmur. No friction rub. No gallop.   Pulmonary:     Effort: No respiratory distress.     Breath sounds: Normal breath sounds. No stridor. No wheezing or  rales.  Abdominal:     General: Bowel sounds are normal.     Palpations: Abdomen is soft.     Tenderness: There is no abdominal tenderness. There is no guarding.  Musculoskeletal: Normal range of motion.  Lymphadenopathy:     Head:     Right side of head: No submandibular adenopathy.     Left side of head: No submandibular adenopathy.     Cervical: No cervical adenopathy.  Skin:    General: Skin is warm and dry.     Findings: No rash.  Neurological:     Mental Status: She is alert and oriented to person, place, and time.     Cranial Nerves: No cranial nerve deficit.     Sensory: No sensory deficit.  Psychiatric:        Speech: Speech normal.      ED Treatments / Results  Labs (all labs ordered are listed, but only abnormal results are displayed) Labs Reviewed - No data to display  EKG None  Radiology No results found.  Procedures Procedures (including critical care time)  Medications Ordered in ED Medications - No data to display   Initial Impression / Assessment and Plan / ED Course  I have reviewed the triage vital signs and the nursing notes.  Pertinent labs & imaging results that were available during my care of the patient were reviewed by me and considered in my medical decision making (see chart for details).     Final Clinical Impressions(s) / ED Diagnoses MDM  Vital signs reviewed.  Patient has a mild tachycardia present.  The pulse oximetry is 100% on room air.  Within normal limits by my interpretation.  The examination favors upper respiratory infection with cough.  There is no high fever recorded during today's visit, and  no have fever noted in the patient's history.  There is no hemoptysis reported.  There has been some mild dizziness, but no loss of consciousness.  No excessive vomiting or diarrhea.  No history of palpitations.  No chest pain.  Patient is asked to use Claritin-D for the congestion in the sinuses and nasal passages.  Patient will increase fluids.  Use Tylenol every 4 hours and ibuprofen every 6 hours for aching and or fever.  Patient will use a short course of steroids to assist with the breathing.  Patient is to follow-up with the primary physician or return to the emergency department if any changes in condition, problems, or concerns.   Final diagnoses:  Viral URI with cough    ED Discharge Orders         Ordered    dexamethasone (DECADRON) 4 MG tablet  2 times daily with meals     10/09/18 1738    loratadine-pseudoephedrine (CLARITIN-D 12 HOUR) 5-120 MG tablet  2 times daily     10/09/18 1738           Ivery Quale, PA-C 10/11/18 1552    Bethann Berkshire, MD 10/11/18 2051

## 2018-10-09 NOTE — ED Triage Notes (Addendum)
Pt reports mucous about 1-2 weeks before cough started on Friday. Cough is productive. Pt reports cough is very forceful making her dizzy feeling. Ears feel like they have pressure.

## 2018-10-09 NOTE — Discharge Instructions (Addendum)
Your vital signs are within normal limits with exception of your heart rate being slightly elevated at 102.  Your oxygen level is 100% on room air.  Within normal limits by my interpretation.  There is fluid behind the drum/tympanic membrane on the right greater than the left, and there is nasal congestion present.  The remainder examination is within normal limits.  The diarrhea that you have experienced may be an attempt of your body to get rid of the organisms making you sick.  Please increase fluids.  Please have everyone in the home wash hands frequently.  Use Claritin-D and Decadron every 12 hours.  Please see Dr. Janna Arch or return to the emergency department if any changes in your condition, problems, or concerns.

## 2018-10-16 ENCOUNTER — Ambulatory Visit (HOSPITAL_COMMUNITY): Payer: Self-pay | Admitting: Psychiatry

## 2018-10-16 ENCOUNTER — Encounter (HOSPITAL_COMMUNITY): Payer: Self-pay | Admitting: Psychiatry

## 2018-10-16 ENCOUNTER — Ambulatory Visit (INDEPENDENT_AMBULATORY_CARE_PROVIDER_SITE_OTHER): Payer: Medicaid Other | Admitting: Psychiatry

## 2018-10-16 DIAGNOSIS — F321 Major depressive disorder, single episode, moderate: Secondary | ICD-10-CM | POA: Diagnosis not present

## 2018-10-16 DIAGNOSIS — F4310 Post-traumatic stress disorder, unspecified: Secondary | ICD-10-CM | POA: Diagnosis not present
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
## 2018-10-17 ENCOUNTER — Ambulatory Visit (INDEPENDENT_AMBULATORY_CARE_PROVIDER_SITE_OTHER): Payer: Medicaid Other | Admitting: Psychiatry

## 2018-10-17 ENCOUNTER — Encounter (HOSPITAL_COMMUNITY): Payer: Self-pay | Admitting: Psychiatry

## 2018-10-17 VITALS — BP 119/75 | HR 92 | Ht 63.0 in | Wt 175.0 lb

## 2018-10-17 DIAGNOSIS — F321 Major depressive disorder, single episode, moderate: Secondary | ICD-10-CM | POA: Diagnosis not present

## 2018-10-17 MED ORDER — VORTIOXETINE HBR 10 MG PO TABS: 10.0000 mg | ORAL_TABLET | Freq: Every day | ORAL | 2 refills | Status: DC

## 2018-10-17 MED ORDER — ALPRAZOLAM 1 MG PO TABS: 1.0000 mg | ORAL_TABLET | Freq: Every day | ORAL | 2 refills | Status: DC | PRN
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
## 2018-11-02 DIAGNOSIS — R05 Cough: Secondary | ICD-10-CM | POA: Diagnosis not present

## 2018-11-02 DIAGNOSIS — J3481 Nasal mucositis (ulcerative): Secondary | ICD-10-CM | POA: Diagnosis not present

## 2018-11-10 ENCOUNTER — Ambulatory Visit (HOSPITAL_COMMUNITY): Payer: Medicaid Other | Admitting: Psychiatry

## 2018-11-24 ENCOUNTER — Ambulatory Visit (INDEPENDENT_AMBULATORY_CARE_PROVIDER_SITE_OTHER): Payer: Medicaid Other | Admitting: Psychiatry

## 2018-11-24 DIAGNOSIS — F4310 Post-traumatic stress disorder, unspecified: Secondary | ICD-10-CM

## 2018-11-24 DIAGNOSIS — F321 Major depressive disorder, single episode, moderate: Secondary | ICD-10-CM
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
## 2018-11-29 ENCOUNTER — Other Ambulatory Visit: Payer: Self-pay

## 2018-11-29 ENCOUNTER — Encounter (HOSPITAL_COMMUNITY): Payer: Self-pay | Admitting: Emergency Medicine

## 2018-11-29 ENCOUNTER — Emergency Department (HOSPITAL_COMMUNITY)
Admission: EM | Admit: 2018-11-29 | Discharge: 2018-11-29 | Disposition: A | Discharge status: Home / Self Care | Payer: Medicaid Other | Attending: Emergency Medicine | Admitting: Emergency Medicine

## 2018-11-29 DIAGNOSIS — L989 Disorder of the skin and subcutaneous tissue, unspecified: Secondary | ICD-10-CM | POA: Diagnosis present

## 2018-11-29 DIAGNOSIS — Z91040 Latex allergy status: Secondary | ICD-10-CM | POA: Insufficient documentation

## 2018-11-29 DIAGNOSIS — F17210 Nicotine dependence, cigarettes, uncomplicated: Secondary | ICD-10-CM | POA: Diagnosis not present

## 2018-11-29 DIAGNOSIS — L729 Follicular cyst of the skin and subcutaneous tissue, unspecified: Secondary | ICD-10-CM | POA: Diagnosis not present

## 2018-11-29 DIAGNOSIS — Z79899 Other long term (current) drug therapy: Secondary | ICD-10-CM | POA: Diagnosis not present

## 2018-11-29 DIAGNOSIS — J45909 Unspecified asthma, uncomplicated: Secondary | ICD-10-CM | POA: Diagnosis not present

## 2018-11-29 MED ORDER — DICLOFENAC SODIUM 75 MG PO TBEC: 75.0000 mg | DELAYED_RELEASE_TABLET | Freq: Two times a day (BID) | ORAL | 0 refills | Status: DC

## 2018-11-29 MED ORDER — PROMETHAZINE HCL 12.5 MG PO TABS
12.5000 mg | ORAL_TABLET | Freq: Once | ORAL | Status: DC
  Filled 2018-11-29: qty 1

## 2018-11-29 MED ORDER — DOXYCYCLINE HYCLATE 100 MG PO TABS
100.0000 mg | ORAL_TABLET | Freq: Once | ORAL | Status: AC
  Administered 2018-11-29: 100 mg via ORAL
  Filled 2018-11-29: qty 1

## 2018-11-29 MED ORDER — KETOROLAC TROMETHAMINE 10 MG PO TABS
10.0000 mg | ORAL_TABLET | Freq: Once | ORAL | Status: AC
  Administered 2018-11-29: 10 mg via ORAL
  Filled 2018-11-29: qty 1

## 2018-11-29 MED ORDER — ONDANSETRON HCL 4 MG PO TABS: 4.0000 mg | ORAL_TABLET | Freq: Four times a day (QID) | ORAL | 0 refills | Status: DC

## 2018-11-29 MED ORDER — DOXYCYCLINE HYCLATE 100 MG PO CAPS: 100.0000 mg | ORAL_CAPSULE | Freq: Two times a day (BID) | ORAL | 0 refills | Status: DC

## 2018-11-29 MED ORDER — TRAMADOL HCL 50 MG PO TABS
100.0000 mg | ORAL_TABLET | Freq: Once | ORAL | Status: AC
  Administered 2018-11-29: 100 mg via ORAL
  Filled 2018-11-29: qty 2

## 2018-11-29 NOTE — ED Triage Notes (Signed)
Pt c/o abscess to right hip x 1 year, pt denies being seen for it prior, pt reports she hit it on a coffee table squatting down yesterday and it has become more painful

## 2018-11-29 NOTE — Discharge Instructions (Signed)
Your examination suggests contusion to a fatty cyst.  We will cover you with doxycycline in the event that the contusion causes problems with infection behind the cyst area.  Please use a warm Epson salt soak for about 10 to 15 minutes over the next 4 or 5 days.  Please use diclofenac and doxycycline 2 times daily with food.  Use Tylenol extra strength in between the doses of diclofenac if needed for discomfort.  Please see Dr. Henreitta Leber for surgical evaluation if this is not improving.

## 2018-11-29 NOTE — ED Provider Notes (Signed)
[facility] EMERGENCY DEPARTMENT Provider Note   CSN: [id number] Arrival date & time: 11/29/18  4098    History   Chief Complaint Chief Complaint  Patient presents with  . Abscess    HPI Karen Shields is a 28 y.o. female.     Patient is a 28 year old female who presents to the emergency department with possible abscess of the right hip.  The patient states that approximately a year ago she was noted to have a raised area of the right hip/buttocks area.  She says that she can usually feel it when she takes a shower, occasionally it gets sore but usually it does not give her any problem.  On last night she was squatting down and hit the corner of a coffee table rather hard.  Initially she did not have any pain or discomfort, but as the night went by she had more more pain at this particular site where the raised area was.  Today she noticed that it was more sore, and a little larger than usual and she presents now for evaluation of this issue.  The patient states that she has had problems with abscess formation in the past, she has a history of methicillin-resistant staph.  She says this does not feel like the abscess areas that she has had in the past.  She says however it does cause her pain particularly if she tries to lay on the right side or right hip and buttocks area.  The history is provided by the patient.    Past Medical History:  Diagnosis Date  . Anxiety   . Asthma    WELL CONTROLLED  . Asthma   . Depression    pt stated  . Eczema 2008  . Gallstones   . GERD (gastroesophageal reflux disease)   . Headache   . Hx MRSA infection 09-2014  . IBS (irritable bowel syndrome)   . Ovarian cyst     Patient Active Problem List   Diagnosis Date Noted  . MDD (major depressive disorder), recurrent episode, severe (HCC) 12/22/2017  . Gallstone   . RUQ pain 12/25/2015  . Gallstones   . Reflux esophagitis   . Nausea without vomiting 11/04/2015  . Cholelithiases  11/04/2015  . GERD (gastroesophageal reflux disease) 11/04/2015  . Early satiety 11/04/2015  . MRSA (methicillin resistant staph aureus) culture positive 12/11/2014  . Menstrual migraine without status migrainosus, not intractable 07/10/2014  . Anxiety 06/14/2013  . Maternal substance abuse 04/14/2013  . H/O cold sores 04/12/2013  . Ovarian cyst, left 02/09/2011    Past Surgical History:  Procedure Laterality Date  . CHOLECYSTECTOMY N/A 01/16/2016   Procedure: LAPAROSCOPIC CHOLECYSTECTOMY;  Surgeon: Lattie Haw, MD;  Location: [facility] ORS;  Service: General;  Laterality: N/A;  . ESOPHAGOGASTRODUODENOSCOPY N/A 11/13/2015   JXB:JYNWGNF reflux   . OVARIAN CYST REMOVAL    . OVARIAN CYST REMOVAL  2011  . TUBAL LIGATION    . WISDOM TOOTH EXTRACTION       OB History    Gravida  2   Para  2   Term  1   Preterm  0   AB  0   Living  2     SAB  0   TAB  0   Ectopic  0   Multiple  0   Live Births  1            Home Medications    Prior to Admission medications   Medication Sig  Start Date End Date Taking? Authorizing Provider  albuterol (PROVENTIL HFA;VENTOLIN HFA) 108 (90 BASE) MCG/ACT inhaler Inhale 2 puffs into the lungs every 6 (six) hours as needed for wheezing. 11/08/14   Campbell Riches, NP  ALPRAZolam Prudy Feeler) 1 MG tablet Take 1 tablet (1 mg total) by mouth daily as needed. for anxiety 10/17/18   Myrlene Broker, MD  Cholecalciferol (VITAMIN D3) 5000 units CAPS Take by mouth.    [provider]  dexamethasone (DECADRON) 4 MG tablet Take 1 tablet (4 mg total) by mouth 2 (two) times daily with a meal. 10/09/18   Ivery Quale, PA-C  doxycycline (VIBRAMYCIN) 100 MG capsule Take 1 capsule (100 mg total) by mouth 2 (two) times daily. 06/12/18   Dione Booze, MD  loratadine-pseudoephedrine (CLARITIN-D 12 HOUR) 5-120 MG tablet Take 1 tablet by mouth 2 (two) times daily. 10/09/18   Ivery Quale, PA-C  nicotine polacrilex (NICORETTE) 2 MG gum Take 1 each (2  mg total) by mouth as needed for smoking cessation. 12/26/17   Oneta Rack, NP  Omeprazole (PRILOSEC PO) Take by mouth.    [provider]  vortioxetine HBr (TRINTELLIX) 10 MG TABS tablet Take 1 tablet (10 mg total) by mouth daily. 10/17/18   Myrlene Broker, MD    Family History Family History  Problem Relation Age of Onset  . Diabetes Mother   . Anxiety disorder Mother   . Depression Mother   . Depression Maternal Grandmother   . Colon cancer Neg Hx   . Crohn's disease Neg Hx   . Celiac disease Neg Hx     Social History Social History   Tobacco Use  . Smoking status: Current Every Day Smoker    Packs/day: 0.50    Years: 5.00    Pack years: 2.50    Types: Cigarettes    Start date: 02/11/2006  . Smokeless tobacco: Never Used  . Tobacco comment: Patient is already making efforts, already has access to [employer] , using gum and chantix  Substance Use Topics  . Alcohol use: Yes    Alcohol/week: 0.0 standard drinks    Comment: occassionally, last used in March 2019  . Drug use: Yes    Types: Benzodiazepines, Opium, Marijuana    Comment: occassional     Allergies   Latex   Review of Systems Review of Systems  Constitutional: Negative for activity change.       All ROS Neg except as noted in HPI  HENT: Negative for nosebleeds.   Eyes: Negative for photophobia and discharge.  Respiratory: Negative for cough, shortness of breath and wheezing.   Cardiovascular: Negative for chest pain and palpitations.  Gastrointestinal: Negative for abdominal pain and blood in stool.  Genitourinary: Negative for dysuria, frequency and hematuria.  Musculoskeletal: Negative for arthralgias, back pain and neck pain.  Skin: Negative.   Neurological: Negative for dizziness, seizures and speech difficulty.  Psychiatric/Behavioral: Negative for confusion and hallucinations.     Physical Exam Updated Vital Signs BP 140/84 (BP Location: Right Arm)   Pulse (!) 117   Temp 97.9  F (36.6 C) (Oral)   Resp 17   Ht 5\' 3"  (1.6 m)   Wt 82.3 kg   LMP 11/01/2018   SpO2 99%   BMI 32.15 kg/m   Physical Exam Vitals signs and nursing note reviewed.  Constitutional:      Appearance: She is well-developed. She is not toxic-appearing.  HENT:     Head: Normocephalic.  Right Ear: Tympanic membrane and external ear normal.     Left Ear: Tympanic membrane and external ear normal.  Eyes:     General: Lids are normal.     Pupils: Pupils are equal, round, and reactive to light.  Neck:     Musculoskeletal: Normal range of motion and neck supple.     Vascular: No carotid bruit.  Cardiovascular:     Rate and Rhythm: Normal rate and regular rhythm.     Pulses: Normal pulses.     Heart sounds: Normal heart sounds.  Pulmonary:     Effort: No respiratory distress.     Breath sounds: Normal breath sounds.  Abdominal:     General: Bowel sounds are normal.     Palpations: Abdomen is soft.     Tenderness: There is no abdominal tenderness. There is no guarding.  Musculoskeletal: Normal range of motion.     Comments: There is full range of motion of the right hip.  There are no palpable nodes of the inguinal area.  Chaperone present during the examination.  Lymphadenopathy:     Head:     Right side of head: No submandibular adenopathy.     Left side of head: No submandibular adenopathy.     Cervical: No cervical adenopathy.  Skin:    General: Skin is warm and dry.     Comments: There is a 1.6cm raised area at the upper right buttocks area.  There is no red streaks appreciated.  There is no drainage noted.  The area is firm and mobile.  The area is not hot.  Neurological:     Mental Status: She is alert and oriented to person, place, and time.     Cranial Nerves: No cranial nerve deficit.     Sensory: No sensory deficit.  Psychiatric:        Speech: Speech normal.      ED Treatments / Results  Labs (all labs ordered are listed, but only abnormal results are  displayed) Labs Reviewed - No data to display  EKG None  Radiology No results found.  Procedures Procedures (including critical care time)  Medications Ordered in ED Medications - No data to display   Initial Impression / Assessment and Plan / ED Course  I have reviewed the triage vital signs and the nursing notes.  Pertinent labs & imaging results that were available during my care of the patient were reviewed by me and considered in my medical decision making (see chart for details).          Final Clinical Impressions(s) / ED Diagnoses MDM  Heart rate is slightly elevated, otherwise vital signs are within normal limits.  The examination favors a fatty cyst.  The patient states however that she bumped the area and she has had a history of methicillin-resistant staph.  The patient will be covered prophylactically with doxycycline 2 times daily.  Will use diclofenac 2 times daily.  Will use Tylenol extra strength in between the diclofenac doses.  Patient will also use a warm tub soak over the next for 5 days.  Patient will see general surgery for additional evaluation if not improving.   Final diagnoses:  Follicular cyst of the skin and subcutaneous tissue, unspecified    ED Discharge Orders         Ordered    diclofenac (VOLTAREN) 75 MG EC tablet  2 times daily     11/29/18 1226    doxycycline (VIBRAMYCIN) 100 MG capsule  2 times daily     11/29/18 1226    ondansetron (ZOFRAN) 4 MG tablet  Every 6 hours     11/29/18 1226           Ivery Quale, Cordelia Poche 12/01/18 2111    Raeford Razor, MD 01/08/19 1152

## 2018-12-01 NOTE — ED Provider Notes (Signed)
[facility] EMERGENCY DEPARTMENT Provider Note   CSN: [id number] Arrival date & time: 11/29/18  4098    History   Chief Complaint Chief Complaint  Patient presents with  . Abscess    HPI Karen Shields is a 28 y.o. female.     Patient is a 28 year old female who presents to the emergency department with eye possible as presents on the right hip.  The patient states that she has noticed a raised area on the right hip for nearly a year.  She says at times it seems to be a little bit larger than others.  On last evening she hit the area on a coffee table rather hard.  She says initially she did not have pain but shortly after that she had pain in the area and it seemed to though there and evening that the area on the right hip got larger.  This morning it was more painful and she thought even may be a little bit larger.  The patient states that she has had some problems with abscess areas in the past and thought she should come to the emergency department for evaluation and is also requesting assistance with her pain.  There is been no fever or chills reported.  No red streaks noted by the patient or any of the patient family members from this raised site.  No nausea or vomiting.  The history is provided by the patient.  Abscess    Past Medical History:  Diagnosis Date  . Anxiety   . Asthma    WELL CONTROLLED  . Asthma   . Depression    pt stated  . Eczema 2008  . Gallstones   . GERD (gastroesophageal reflux disease)   . Headache   . Hx MRSA infection 09-2014  . IBS (irritable bowel syndrome)   . Ovarian cyst     Patient Active Problem List   Diagnosis Date Noted  . MDD (major depressive disorder), recurrent episode, severe (HCC) 12/22/2017  . Gallstone   . RUQ pain 12/25/2015  . Gallstones   . Reflux esophagitis   . Nausea without vomiting 11/04/2015  . Cholelithiases 11/04/2015  . GERD (gastroesophageal reflux disease) 11/04/2015  . Early satiety 11/04/2015  .  MRSA (methicillin resistant staph aureus) culture positive 12/11/2014  . Menstrual migraine without status migrainosus, not intractable 07/10/2014  . Anxiety 06/14/2013  . Maternal substance abuse 04/14/2013  . H/O cold sores 04/12/2013  . Ovarian cyst, left 02/09/2011    Past Surgical History:  Procedure Laterality Date  . CHOLECYSTECTOMY N/A 01/16/2016   Procedure: LAPAROSCOPIC CHOLECYSTECTOMY;  Surgeon: Lattie Haw, MD;  Location: [facility] ORS;  Service: General;  Laterality: N/A;  . ESOPHAGOGASTRODUODENOSCOPY N/A 11/13/2015   JXB:JYNWGNF reflux   . OVARIAN CYST REMOVAL    . OVARIAN CYST REMOVAL  2011  . TUBAL LIGATION    . WISDOM TOOTH EXTRACTION       OB History    Gravida  2   Para  2   Term  1   Preterm  0   AB  0   Living  2     SAB  0   TAB  0   Ectopic  0   Multiple  0   Live Births  1            Home Medications    Prior to Admission medications   Medication Sig Start Date End Date Taking? Authorizing Provider  albuterol (PROVENTIL HFA;VENTOLIN HFA) 108 (  90 BASE) MCG/ACT inhaler Inhale 2 puffs into the lungs every 6 (six) hours as needed for wheezing. 11/08/14   Campbell Riches, NP  ALPRAZolam Prudy Feeler) 1 MG tablet Take 1 tablet (1 mg total) by mouth daily as needed. for anxiety 10/17/18   Myrlene Broker, MD  Cholecalciferol (VITAMIN D3) 5000 units CAPS Take by mouth.    [provider]  dexamethasone (DECADRON) 4 MG tablet Take 1 tablet (4 mg total) by mouth 2 (two) times daily with a meal. 10/09/18   Ivery Quale, PA-C  diclofenac (VOLTAREN) 75 MG EC tablet Take 1 tablet (75 mg total) by mouth 2 (two) times daily. 11/29/18   Ivery Quale, PA-C  doxycycline (VIBRAMYCIN) 100 MG capsule Take 1 capsule (100 mg total) by mouth 2 (two) times daily. 11/29/18   Ivery Quale, PA-C  loratadine-pseudoephedrine (CLARITIN-D 12 HOUR) 5-120 MG tablet Take 1 tablet by mouth 2 (two) times daily. 10/09/18   Ivery Quale, PA-C  nicotine polacrilex  (NICORETTE) 2 MG gum Take 1 each (2 mg total) by mouth as needed for smoking cessation. 12/26/17   Oneta Rack, NP  Omeprazole (PRILOSEC PO) Take by mouth.    [provider]  ondansetron (ZOFRAN) 4 MG tablet Take 1 tablet (4 mg total) by mouth every 6 (six) hours. 11/29/18   Ivery Quale, PA-C  vortioxetine HBr (TRINTELLIX) 10 MG TABS tablet Take 1 tablet (10 mg total) by mouth daily. 10/17/18   Myrlene Broker, MD    Family History Family History  Problem Relation Age of Onset  . Diabetes Mother   . Anxiety disorder Mother   . Depression Mother   . Depression Maternal Grandmother   . Colon cancer Neg Hx   . Crohn's disease Neg Hx   . Celiac disease Neg Hx     Social History Social History   Tobacco Use  . Smoking status: Current Every Day Smoker    Packs/day: 0.50    Years: 5.00    Pack years: 2.50    Types: Cigarettes    Start date: 02/11/2006  . Smokeless tobacco: Never Used  . Tobacco comment: Patient is already making efforts, already has access to [employer] , using gum and chantix  Substance Use Topics  . Alcohol use: Yes    Alcohol/week: 0.0 standard drinks    Comment: occassionally, last used in March 2019  . Drug use: Yes    Types: Benzodiazepines, Opium, Marijuana    Comment: occassional     Allergies   Latex   Review of Systems Review of Systems  Constitutional: Negative for activity change.       All ROS Neg except as noted in HPI  HENT: Negative for nosebleeds.   Eyes: Negative for photophobia and discharge.  Respiratory: Negative for cough, shortness of breath and wheezing.   Cardiovascular: Negative for chest pain and palpitations.  Gastrointestinal: Negative for abdominal pain and blood in stool.  Genitourinary: Negative for dysuria, frequency and hematuria.  Musculoskeletal: Negative for arthralgias, back pain and neck pain.       Hip pain  Skin: Negative.   Neurological: Negative for dizziness, seizures and speech difficulty.   Psychiatric/Behavioral: Negative for confusion and hallucinations.     Physical Exam Updated Vital Signs BP (!) 153/93 (BP Location: Right Arm)   Pulse 93   Temp 97.8 F (36.6 C) (Oral)   Resp 16   Ht 5\' 3"  (1.6 m)   Wt 82.3 kg   LMP 11/01/2018  SpO2 100%   BMI 32.15 kg/m   Physical Exam Vitals signs and nursing note reviewed.  Constitutional:      Appearance: She is well-developed. She is not toxic-appearing.  HENT:     Head: Normocephalic.     Right Ear: Tympanic membrane and external ear normal.     Left Ear: Tympanic membrane and external ear normal.  Eyes:     General: Lids are normal.     Pupils: Pupils are equal, round, and reactive to light.  Neck:     Musculoskeletal: Normal range of motion and neck supple.     Vascular: No carotid bruit.  Cardiovascular:     Rate and Rhythm: Normal rate and regular rhythm.     Pulses: Normal pulses.     Heart sounds: Normal heart sounds.  Pulmonary:     Effort: No respiratory distress.     Breath sounds: Normal breath sounds.  Abdominal:     General: Bowel sounds are normal.     Palpations: Abdomen is soft.     Tenderness: There is no abdominal tenderness. There is no guarding.  Musculoskeletal: Normal range of motion.     Comments: There is full range of motion of the right hip.  There are no hot areas appreciated.  There are no palpable nodes in the inguinal area.  Chaperone present during the examination.  There is a raised cyst at the upper mid posterior right hip.  The area is tender to touch.  There is no red streaks appreciated.  The area is not hot.  There are no other similar areas on the right or left buttocks.  Lymphadenopathy:     Head:     Right side of head: No submandibular adenopathy.     Left side of head: No submandibular adenopathy.     Cervical: No cervical adenopathy.  Skin:    General: Skin is warm and dry.  Neurological:     Mental Status: She is alert and oriented to person, place, and time.      Cranial Nerves: No cranial nerve deficit.     Sensory: No sensory deficit.  Psychiatric:        Speech: Speech normal.      ED Treatments / Results  Labs (all labs ordered are listed, but only abnormal results are displayed) Labs Reviewed - No data to display  EKG None  Radiology No results found.  Procedures Procedures (including critical care time)  Medications Ordered in ED Medications  traMADol (ULTRAM) tablet 100 mg (100 mg Oral Given 11/29/18 1241)  doxycycline (VIBRA-TABS) tablet 100 mg (100 mg Oral Given 11/29/18 1241)  ketorolac (TORADOL) tablet 10 mg (10 mg Oral Given 11/29/18 1241)     Initial Impression / Assessment and Plan / ED Course  I have reviewed the triage vital signs and the nursing notes.  Pertinent labs & imaging results that were available during my care of the patient were reviewed by me and considered in my medical decision making (see chart for details).          Final Clinical Impressions(s) / ED Diagnoses MDM  Vital signs reviewed.  Pulse oximetry is well within normal limits.  The patient has some soreness with walking, but no limitation of range of motion of the hip area the raised area is mostly in the right buttocks region.  The examination favors a's cyst present.  I have asked the patient to use warm tub soaks over the next few days.  Patient will receive a prescription for an antibiotic given that she is currently having some problems with small abscess areas.  The patient is given instructions to return to the emergency department if any changes in the size of the cyst, if it becomes hot to touch, or if there is red streaks from the area.  The patient is in agreement with this plan.   Final diagnoses:  Follicular cyst of the skin and subcutaneous tissue, unspecified    ED Discharge Orders         Ordered    diclofenac (VOLTAREN) 75 MG EC tablet  2 times daily     11/29/18 1226    doxycycline (VIBRAMYCIN) 100 MG capsule  2  times daily     11/29/18 1226    ondansetron (ZOFRAN) 4 MG tablet  Every 6 hours     11/29/18 1226           Ivery QualeBryant, Suad Autrey, PA-C 12/01/18 0029    Raeford RazorKohut, Stephen, MD 12/01/18 [phone]

## 2018-12-07 ENCOUNTER — Ambulatory Visit (INDEPENDENT_AMBULATORY_CARE_PROVIDER_SITE_OTHER): Payer: Medicaid Other | Admitting: Psychiatry

## 2018-12-07 ENCOUNTER — Other Ambulatory Visit: Payer: Self-pay

## 2018-12-07 DIAGNOSIS — F4310 Post-traumatic stress disorder, unspecified: Secondary | ICD-10-CM | POA: Diagnosis not present

## 2018-12-07 DIAGNOSIS — Z630 Problems in relationship with spouse or partner: Secondary | ICD-10-CM

## 2018-12-07 DIAGNOSIS — F321 Major depressive disorder, single episode, moderate: Secondary | ICD-10-CM

## 2018-12-07 DIAGNOSIS — Z598 Other problems related to housing and economic circumstances: Secondary | ICD-10-CM | POA: Diagnosis not present

## 2018-12-07 DIAGNOSIS — F329 Major depressive disorder, single episode, unspecified: Secondary | ICD-10-CM | POA: Diagnosis not present
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
## 2018-12-21 ENCOUNTER — Other Ambulatory Visit: Payer: Self-pay

## 2018-12-21 ENCOUNTER — Ambulatory Visit (HOSPITAL_COMMUNITY): Payer: Medicaid Other | Admitting: Psychiatry

## 2019-01-02 DIAGNOSIS — F418 Other specified anxiety disorders: Secondary | ICD-10-CM | POA: Diagnosis not present

## 2019-01-02 DIAGNOSIS — F329 Major depressive disorder, single episode, unspecified: Secondary | ICD-10-CM | POA: Diagnosis not present

## 2019-01-02 DIAGNOSIS — K219 Gastro-esophageal reflux disease without esophagitis: Secondary | ICD-10-CM | POA: Diagnosis not present

## 2019-01-15 ENCOUNTER — Other Ambulatory Visit (HOSPITAL_COMMUNITY): Payer: Self-pay | Admitting: Psychiatry

## 2019-01-17 ENCOUNTER — Other Ambulatory Visit: Payer: Self-pay

## 2019-01-17 ENCOUNTER — Encounter (HOSPITAL_COMMUNITY): Payer: Self-pay | Admitting: Psychiatry

## 2019-01-17 ENCOUNTER — Ambulatory Visit (INDEPENDENT_AMBULATORY_CARE_PROVIDER_SITE_OTHER): Payer: Medicaid Other | Admitting: Psychiatry

## 2019-01-17 DIAGNOSIS — F321 Major depressive disorder, single episode, moderate: Secondary | ICD-10-CM

## 2019-01-17 MED ORDER — ALPRAZOLAM 1 MG PO TABS: ORAL_TABLET | ORAL | 2 refills | Status: DC

## 2019-01-17 MED ORDER — VORTIOXETINE HBR 10 MG PO TABS: 10.0000 mg | ORAL_TABLET | Freq: Every day | ORAL | 2 refills | Status: DC
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
## 2019-02-20 ENCOUNTER — Other Ambulatory Visit: Payer: Self-pay

## 2019-02-20 ENCOUNTER — Telehealth: Payer: Self-pay | Admitting: *Deleted

## 2019-02-20 ENCOUNTER — Ambulatory Visit
Admission: EM | Admit: 2019-02-20 | Discharge: 2019-02-20 | Disposition: A | Discharge status: Home / Self Care | Payer: Medicaid Other | Attending: Emergency Medicine | Admitting: Emergency Medicine

## 2019-02-20 ENCOUNTER — Ambulatory Visit (INDEPENDENT_AMBULATORY_CARE_PROVIDER_SITE_OTHER): Payer: Medicaid Other

## 2019-02-20 DIAGNOSIS — R0602 Shortness of breath: Secondary | ICD-10-CM | POA: Diagnosis not present

## 2019-02-20 DIAGNOSIS — R058 Other specified cough: Secondary | ICD-10-CM

## 2019-02-20 DIAGNOSIS — J302 Other seasonal allergic rhinitis: Secondary | ICD-10-CM | POA: Diagnosis not present

## 2019-02-20 DIAGNOSIS — J40 Bronchitis, not specified as acute or chronic: Secondary | ICD-10-CM | POA: Diagnosis not present

## 2019-02-20 DIAGNOSIS — F17210 Nicotine dependence, cigarettes, uncomplicated: Secondary | ICD-10-CM | POA: Diagnosis not present

## 2019-02-20 DIAGNOSIS — R05 Cough: Secondary | ICD-10-CM | POA: Diagnosis not present

## 2019-02-20 MED ORDER — PREDNISONE 20 MG PO TABS: 20.0000 mg | ORAL_TABLET | Freq: Two times a day (BID) | ORAL | 0 refills | Status: AC

## 2019-02-20 MED ORDER — BENZONATATE 100 MG PO CAPS: 100.0000 mg | ORAL_CAPSULE | Freq: Three times a day (TID) | ORAL | 0 refills | Status: DC

## 2019-02-20 NOTE — ED Provider Notes (Signed)
[facility]   [id number] 02/20/19 Arrival Time: 1709  Cc: COUGH  SUBJECTIVE:  Karen Shields is a 28 y.o. female hx significant for asthma, tobacco use, depression, GERD, IBS, who presents with worsening productive cough x 2 weeks.  Denies sick exposure to COVID, flu or strep.  [place] while there.  Describes cough as intermittent and productive with green/ white sputum.  Has tried OTC medications without relief.   Reports previous symptoms in the past and diagnosed with CAP, and bronchitis.   Denies fever, chills, fatigue, sinus pain, rhinorrhea, sore throat, SOB, chest pain, nausea, changes in bowel or bladder habits.    ROS: As per HPI.  Past Medical History:  Diagnosis Date  . Anxiety   . Asthma    WELL CONTROLLED  . Asthma   . Depression    pt stated  . Eczema 2008  . Gallstones   . GERD (gastroesophageal reflux disease)   . Headache   . Hx MRSA infection 09-2014  . IBS (irritable bowel syndrome)   . Ovarian cyst    Past Surgical History:  Procedure Laterality Date  . CHOLECYSTECTOMY N/A 01/16/2016   Procedure: LAPAROSCOPIC CHOLECYSTECTOMY;  Surgeon: Lattie Hawichard E Cooper, MD;  Location: [facility];  Service: General;  Laterality: N/A;  . ESOPHAGOGASTRODUODENOSCOPY N/A 11/13/2015   WUJ:WJXBJYNRMR:erosive reflux   . OVARIAN CYST REMOVAL    . OVARIAN CYST REMOVAL  2011  . TUBAL LIGATION    . WISDOM TOOTH EXTRACTION     Allergies  Allergen Reactions  . Latex Dermatitis   No current facility-administered medications on file prior to encounter.    Current Outpatient Medications on File Prior to Encounter  Medication Sig Dispense Refill  . loratadine-pseudoephedrine (CLARITIN-D 12 HOUR) 5-120 MG tablet Take 1 tablet by mouth 2 (two) times daily. 20 tablet 0  . ondansetron (ZOFRAN) 4 MG tablet Take 1 tablet (4 mg total) by mouth every 6 (six) hours. 12 tablet 0  . albuterol (PROVENTIL HFA;VENTOLIN HFA) 108 (90 BASE)  MCG/ACT inhaler Inhale 2 puffs into the lungs every 6 (six) hours as needed for wheezing. 18 g 5  . ALPRAZolam (XANAX) 1 MG tablet TAKE 1 TABLET BY MOUTH ONCE A DAY AS NEEDED FOR ANXIETY. 30 tablet 2  . Cholecalciferol (VITAMIN D3) 5000 units CAPS Take by mouth.    . diclofenac (VOLTAREN) 75 MG EC tablet Take 1 tablet (75 mg total) by mouth 2 (two) times daily. 12 tablet 0  . nicotine polacrilex (NICORETTE) 2 MG gum Take 1 each (2 mg total) by mouth as needed for smoking cessation. 100 tablet 0  . Omeprazole (PRILOSEC PO) Take by mouth.      Social History   Socioeconomic History  . Marital status: Married    Spouse name: Not on file  . Number of children: 2  . Years of education: Not on file  . Highest education level: Not on file  Occupational History  . Not on file  Social Needs  . Financial resource strain: Not on file  . Food insecurity:    Worry: Not on file    Inability: Not on file  . Transportation needs:    Medical: Not on file    Non-medical: Not on file  Tobacco Use  . Smoking status: Current Every Day Smoker    Packs/day: 0.50    Years: 5.00    Pack years: 2.50    Types: Cigarettes  Start date: 02/11/2006  . Smokeless tobacco: Never Used  . Tobacco comment: Patient is already making efforts, already has access to [employer] , using gum and chantix  Substance and Sexual Activity  . Alcohol use: Yes    Alcohol/week: 0.0 standard drinks    Comment: occassionally, last used in March 2019  . Drug use: Yes    Types: Benzodiazepines, Opium, Marijuana    Comment: occassional  . Sexual activity: Yes    Partners: Male    Birth control/protection: Surgical  Lifestyle  . Physical activity:    Days per week: Not on file    Minutes per session: Not on file  . Stress: Not on file  Relationships  . Social connections:    Talks on phone: Not on file    Gets together: Not on file    Attends religious service: Not on file    Active member of club or organization:  Not on file    Attends meetings of clubs or organizations: Not on file    Relationship status: Not on file  . Intimate partner violence:    Fear of current or ex partner: Not on file    Emotionally abused: Not on file    Physically abused: Not on file    Forced sexual activity: Not on file  Other Topics Concern  . Not on file  Social History Narrative   ** Merged History Encounter **    Four daughters between herself and husband, two biological.    Family History  Problem Relation Age of Onset  . Diabetes Mother   . Anxiety disorder Mother   . Depression Mother   . Depression Maternal Grandmother   . Colon cancer Neg Hx   . Crohn's disease Neg Hx   . Celiac disease Neg Hx      OBJECTIVE:  Vitals:   02/20/19 1734  BP: 134/85  Pulse: (!) 108  Resp: 18  Temp: 98.5 F (36.9 C)  TempSrc: Oral  SpO2: 97%     General appearance: Alert, appears mildly fatigued, but nontoxic; speaking in full sentences without difficulty HEENT:NCAT; Ears: EACs clear, TMs pearly gray; Eyes: PERRL.  EOM grossly intact. Nose: nares patent without rhinorrhea; Throat: tonsils nonerythematous or enlarged, uvula midline  Neck: supple without LAD Lungs: clear to auscultation bilaterally without adventitious breath sounds; normal respiratory effort; mild cough present Heart: regular rate and rhythm.  Radial pulses 2+ symmetrical bilaterally Skin: warm and dry Psychological: alert and cooperative; normal mood and affect  DIAGNOSTIC STUDIES:  Dg Chest 2 View  Result Date: 02/20/2019 CLINICAL DATA:  Cough for the past 2 weeks with some production, congestion, shortness of breath and seasonal allergies. Smoker. EXAM: CHEST - 2 VIEW COMPARISON:  06/11/2018. FINDINGS: Normal sized heart. Clear lungs. Stable mild peribronchial thickening. Cholecystectomy clips. Unremarkable bones. IMPRESSION: No acute abnormality. Stable mild chronic bronchitic changes. Electronically Signed   By: Beckie Salts M.D.   On:  02/20/2019 18:16     ASSESSMENT & PLAN:  1. Bronchitis   2. Productive cough     Meds ordered this encounter  Medications  . predniSONE (DELTASONE) 20 MG tablet    Sig: Take 1 tablet (20 mg total) by mouth 2 (two) times daily with a meal for 5 days.    Dispense:  10 tablet    Refill:  0    Order Specific Question:   Supervising Provider    Answer:   Eustace Moore [[id number]]  . benzonatate (TESSALON)  100 MG capsule    Sig: Take 1 capsule (100 mg total) by mouth every 8 (eight) hours.    Dispense:  21 capsule    Refill:  0    Order Specific Question:   Supervising Provider    Answer:   Eustace Moore [[id number]]    Orders Placed This Encounter  Procedures  . DG Chest 2 View    Standing Status:   Standing    Number of Occurrences:   1    Order Specific Question:   Reason for Exam (SYMPTOM  OR DIAGNOSIS REQUIRED)    Answer:   productive cough    Chest x-ray showed stable mild chronic bronchitis changes Get plenty of rest and push fluids Use inhaler as needed for shortness of breath and/or wheezing Prednisone prescribed.  Take as directed and to completion Prescribed tessolone perles as needed for cough Follow up with PCP for recheck and/or if symptoms persists  COVID testing ordered.  Outpatient center will contact you regarding your appointment In the meantime: You should remain isolated in your home for 7 days from symptom onset AND greater than 72 hours after symptoms resolution (absence of fever without the use of fever-reducing medication and improvement in respiratory symptoms), whichever is longer Call or go to the ED if you have any new or worsening symptoms such as fever, worsening cough, shortness of breath, chest tightness, chest pain, turning blue, changes in mental status, etc...  Reviewed expectations re: course of current medical issues. Questions answered. Outlined signs and symptoms indicating need for more acute intervention. Patient verbalized  understanding. After Visit Summary given.          Rennis Harding, PA-C 02/20/19 1826

## 2019-02-20 NOTE — Discharge Instructions (Addendum)
Chest x-ray showed stable mild chronic bronchitis changes Get plenty of rest and push fluids Use inhaler as needed for shortness of breath and/or wheezing Prednisone prescribed.  Take as directed and to completion Prescribed tessolone perles as needed for cough Follow up with PCP for recheck and/or if symptoms persists  COVID testing ordered.  Outpatient center will contact you regarding your appointment In the meantime: You should remain isolated in your home for 7 days from symptom onset AND greater than 72 hours after symptoms resolution (absence of fever without the use of fever-reducing medication and improvement in respiratory symptoms), whichever is longer Call or go to the ED if you have any new or worsening symptoms such as fever, worsening cough, shortness of breath, chest tightness, chest pain, turning blue, changes in mental status, etc..Marland Kitchen

## 2019-02-20 NOTE — ED Triage Notes (Signed)
Productive cough 2 weeks ago

## 2019-02-20 NOTE — Telephone Encounter (Signed)
Pt called and left message to return call to schedule COVID-19 testing.  

## 2019-02-20 NOTE — Telephone Encounter (Signed)
-----   Message from Rennis Harding, [place] sent at 02/20/2019  6:24 PM EDT ----- Regarding: COVID testing needed Worsening productive cough [place]

## 2019-02-21 ENCOUNTER — Telehealth: Payer: Self-pay | Admitting: *Deleted

## 2019-02-21 ENCOUNTER — Other Ambulatory Visit: Payer: Self-pay

## 2019-02-21 DIAGNOSIS — Z20822 Contact with and (suspected) exposure to covid-19: Secondary | ICD-10-CM

## 2019-02-21 NOTE — Telephone Encounter (Signed)
Pt scheduled  

## 2019-02-21 NOTE — Telephone Encounter (Signed)
Pt scheduled for Covid-19 testing per [place] , PA-C. Appt secured for today at [facility] site.  Testing process reviewed, pt verbalizes understanding.  Pt with cough.

## 2019-02-23 LAB — NOVEL CORONAVIRUS, NAA: SARS-CoV-2, NAA: NOT DETECTED

## 2019-03-02 DIAGNOSIS — K219 Gastro-esophageal reflux disease without esophagitis: Secondary | ICD-10-CM | POA: Diagnosis not present

## 2019-03-02 DIAGNOSIS — J209 Acute bronchitis, unspecified: Secondary | ICD-10-CM | POA: Diagnosis not present

## 2019-03-04 ENCOUNTER — Other Ambulatory Visit: Payer: Self-pay

## 2019-03-04 ENCOUNTER — Encounter (HOSPITAL_COMMUNITY): Payer: Self-pay | Admitting: Emergency Medicine

## 2019-03-04 ENCOUNTER — Emergency Department (HOSPITAL_COMMUNITY): Payer: Medicaid Other

## 2019-03-04 ENCOUNTER — Emergency Department (HOSPITAL_COMMUNITY)
Admission: EM | Admit: 2019-03-04 | Discharge: 2019-03-04 | Disposition: A | Discharge status: Home / Self Care | Payer: Medicaid Other | Attending: Emergency Medicine | Admitting: Emergency Medicine

## 2019-03-04 DIAGNOSIS — Z79899 Other long term (current) drug therapy: Secondary | ICD-10-CM | POA: Insufficient documentation

## 2019-03-04 DIAGNOSIS — J45909 Unspecified asthma, uncomplicated: Secondary | ICD-10-CM | POA: Insufficient documentation

## 2019-03-04 DIAGNOSIS — R1030 Lower abdominal pain, unspecified: Secondary | ICD-10-CM

## 2019-03-04 DIAGNOSIS — R1084 Generalized abdominal pain: Secondary | ICD-10-CM | POA: Diagnosis not present

## 2019-03-04 DIAGNOSIS — F17210 Nicotine dependence, cigarettes, uncomplicated: Secondary | ICD-10-CM | POA: Diagnosis not present

## 2019-03-04 DIAGNOSIS — Z91040 Latex allergy status: Secondary | ICD-10-CM | POA: Diagnosis not present

## 2019-03-04 DIAGNOSIS — R1110 Vomiting, unspecified: Secondary | ICD-10-CM | POA: Diagnosis not present

## 2019-03-04 LAB — URINALYSIS, ROUTINE W REFLEX MICROSCOPIC
Bilirubin Urine: NEGATIVE
Glucose, UA: NEGATIVE mg/dL
Hgb urine dipstick: NEGATIVE
Ketones, ur: NEGATIVE mg/dL
Leukocytes,Ua: NEGATIVE
Nitrite: NEGATIVE
Protein, ur: NEGATIVE mg/dL
Specific Gravity, Urine: 1.009 (ref 1.005–1.030)
pH: 7 (ref 5.0–8.0)

## 2019-03-04 LAB — COMPREHENSIVE METABOLIC PANEL
ALT: 24 U/L (ref 0–44)
AST: 23 U/L (ref 15–41)
Albumin: 3.8 g/dL (ref 3.5–5.0)
Alkaline Phosphatase: 93 U/L (ref 38–126)
Anion gap: 9 (ref 5–15)
BUN: 10 mg/dL (ref 6–20)
CO2: 28 mmol/L (ref 22–32)
Calcium: 9.6 mg/dL (ref 8.9–10.3)
Chloride: 101 mmol/L (ref 98–111)
Creatinine, Ser: 0.96 mg/dL (ref 0.44–1.00)
GFR calc Af Amer: >60 mL/min (ref >60)
GFR calc non Af Amer: >60 mL/min (ref >60)
Glucose, Bld: 95 mg/dL (ref 70–99)
Potassium: 3.8 mmol/L (ref 3.5–5.1)
Sodium: 138 mmol/L (ref 135–145)
Total Bilirubin: 0.1 mg/dL — ABNORMAL LOW (ref 0.3–1.2)
Total Protein: 6.8 g/dL (ref 6.5–8.1)

## 2019-03-04 LAB — CBC
HCT: 40.5 % (ref 36.0–46.0)
Hemoglobin: 13.8 g/dL (ref 12.0–15.0)
MCH: 30.6 pg (ref 26.0–34.0)
MCHC: 34.1 g/dL (ref 30.0–36.0)
MCV: 89.8 fL (ref 80.0–100.0)
Platelets: 393 10*3/uL (ref 150–400)
RBC: 4.51 MIL/uL (ref 3.87–5.11)
RDW: 12.5 % (ref 11.5–15.5)
WBC: 12.5 10*3/uL — ABNORMAL HIGH (ref 4.0–10.5)
nRBC: 0 % (ref 0.0–0.2)

## 2019-03-04 LAB — PREGNANCY, URINE: Preg Test, Ur: NEGATIVE

## 2019-03-04 LAB — LIPASE, BLOOD: Lipase: 23 U/L (ref 11–51)

## 2019-03-04 MED ORDER — ONDANSETRON HCL 4 MG/2ML IJ SOLN
4.0000 mg | Freq: Once | INTRAMUSCULAR | Status: AC
  Administered 2019-03-04: 4 mg via INTRAVENOUS
  Filled 2019-03-04: qty 2

## 2019-03-04 MED ORDER — IOHEXOL 300 MG/ML  SOLN
100.0000 mL | Freq: Once | INTRAMUSCULAR | Status: AC | PRN
  Administered 2019-03-04: 100 mL via INTRAVENOUS

## 2019-03-04 MED ORDER — SODIUM CHLORIDE 0.9 % IV SOLN
Freq: Once | INTRAVENOUS | Status: AC
  Administered 2019-03-04: 20:00:00 via INTRAVENOUS

## 2019-03-04 MED ORDER — DICYCLOMINE HCL 20 MG PO TABS: 20.0000 mg | ORAL_TABLET | Freq: Two times a day (BID) | ORAL | 0 refills | Status: DC

## 2019-03-04 MED ORDER — SODIUM CHLORIDE 0.9% FLUSH: 3.0000 mL | Freq: Once | INTRAVENOUS | Status: DC

## 2019-03-04 MED ORDER — HYDROMORPHONE HCL 1 MG/ML IJ SOLN
1.0000 mg | Freq: Once | INTRAMUSCULAR | Status: AC
  Administered 2019-03-04: 1 mg via INTRAVENOUS
  Filled 2019-03-04: qty 1

## 2019-03-04 NOTE — ED Triage Notes (Signed)
Patient c/o epigastric pain with lower abd cramping. Per patient started 3 days ago with epigastric pain. Patient states has hx of acid reflux. Patient reports nausea without vomiting, fever, urinary symptoms, or diarrhea. Patient reports using prescribed zofran and over the counter medication for heartburn and nausea with no relief. Patient reports change in stool color. Patient reports feeling "clammy and dizzy while trying to have a BM." Patient states has had similar episodes in past in which see has addressed with PCP but has not been resolved or treated properly.

## 2019-03-04 NOTE — Discharge Instructions (Addendum)
Bland diet.  Take the bentyl as directed.  Call the GI provider listed to arrange a follow-up appt.

## 2019-03-04 NOTE — ED Provider Notes (Signed)
[facility] Provider Note   CSN: [id number] Arrival date & time: 03/04/19  1845     History   Chief Complaint Chief Complaint  Patient presents with  . Abdominal Pain    HPI Karen Shields is a 28 y.o. female.     HPI   Karen Shields is a 28 y.o. female with hx of GERD and IBS presents to the Emergency Department complaining of diffuse lower abdominal pain and loose but formed stool of a clay color.  She states that she woke up this morning with cramping pain across her lower stomach that have been waxing and waning all day.  She states that with her IBS this is typical of her, but the pain was more intense today.  She also notes having multiple stools today that have been clay colored.  No blood or mucus.  She denies change in diet, fever, vomiting, rectal pain or new medications.  She does not currently have a GI provider and has not seen one in about 4 years.       Past Medical History:  Diagnosis Date  . Anxiety   . Asthma    WELL CONTROLLED  . Asthma   . Depression    pt stated  . Eczema 2008  . Gallstones   . GERD (gastroesophageal reflux disease)   . Headache   . Hx MRSA infection 09-2014  . IBS (irritable bowel syndrome)   . Ovarian cyst     Patient Active Problem List   Diagnosis Date Noted  . MDD (major depressive disorder), recurrent episode, severe (HCC) 12/22/2017  . Gallstone   . RUQ pain 12/25/2015  . Gallstones   . Reflux esophagitis   . Nausea without vomiting 11/04/2015  . Cholelithiases 11/04/2015  . GERD (gastroesophageal reflux disease) 11/04/2015  . Early satiety 11/04/2015  . MRSA (methicillin resistant staph aureus) culture positive 12/11/2014  . Menstrual migraine without status migrainosus, not intractable 07/10/2014  . Anxiety 06/14/2013  . Maternal substance abuse 04/14/2013  . H/O cold sores 04/12/2013  . Ovarian cyst, left 02/09/2011    Past Surgical History:  Procedure Laterality Date  .  CHOLECYSTECTOMY N/A 01/16/2016   Procedure: LAPAROSCOPIC CHOLECYSTECTOMY;  Surgeon: Lattie Hawichard E Cooper, MD;  Location: [facility] ORS;  Service: General;  Laterality: N/A;  . ESOPHAGOGASTRODUODENOSCOPY N/A 11/13/2015   WUJ:WJXBJYNRMR:erosive reflux   . OVARIAN CYST REMOVAL    . OVARIAN CYST REMOVAL  2011  . TUBAL LIGATION    . WISDOM TOOTH EXTRACTION       OB History    Gravida  2   Para  2   Term  1   Preterm  0   AB  0   Living  2     SAB  0   TAB  0   Ectopic  0   Multiple  0   Live Births  1            Home Medications    Prior to Admission medications   Medication Sig Start Date End Date Taking? Authorizing Provider  albuterol (PROVENTIL HFA;VENTOLIN HFA) 108 (90 BASE) MCG/ACT inhaler Inhale 2 puffs into the lungs every 6 (six) hours as needed for wheezing. 11/08/14   Campbell RichesHoskins, Carolyn C, NP  ALPRAZolam Prudy Feeler(XANAX) 1 MG tablet TAKE 1 TABLET BY MOUTH ONCE A DAY AS NEEDED FOR ANXIETY. 01/17/19   Myrlene Brokeross, Deborah R, MD  benzonatate (TESSALON) 100 MG capsule Take 1 capsule (100 mg total) by mouth every  8 (eight) hours. 02/20/19   Wurst, [place], PA-C  Cholecalciferol (VITAMIN D3) 5000 units CAPS Take by mouth.    [provider]  diclofenac (VOLTAREN) 75 MG EC tablet Take 1 tablet (75 mg total) by mouth 2 (two) times daily. 11/29/18   Lily Kocher, PA-C  dicyclomine (BENTYL) 20 MG tablet Take 1 tablet (20 mg total) by mouth 2 (two) times daily. 03/04/19   Dhruv Christina, PA-C  loratadine-pseudoephedrine (CLARITIN-D 12 HOUR) 5-120 MG tablet Take 1 tablet by mouth 2 (two) times daily. 10/09/18   Lily Kocher, PA-C  nicotine polacrilex (NICORETTE) 2 MG gum Take 1 each (2 mg total) by mouth as needed for smoking cessation. 12/26/17   Derrill Center, NP  Omeprazole (PRILOSEC PO) Take by mouth.    [provider]  ondansetron (ZOFRAN) 4 MG tablet Take 1 tablet (4 mg total) by mouth every 6 (six) hours. 11/29/18   Lily Kocher, PA-C    Family History Family History  Problem  Relation Age of Onset  . Diabetes Mother   . Anxiety disorder Mother   . Depression Mother   . Depression Maternal Grandmother   . Colon cancer Neg Hx   . Crohn's disease Neg Hx   . Celiac disease Neg Hx     Social History Social History   Tobacco Use  . Smoking status: Current Every Day Smoker    Packs/day: 0.50    Years: 5.00    Pack years: 2.50    Types: Cigarettes    Start date: 02/11/2006  . Smokeless tobacco: Never Used  . Tobacco comment: Patient is already making efforts, already has access to [employer] , using gum and chantix  Substance Use Topics  . Alcohol use: Yes    Alcohol/week: 0.0 standard drinks    Comment: occassionally, last used in March 2019  . Drug use: Yes    Types: Benzodiazepines, Opium, Marijuana    Comment: occassional     Allergies   Latex   Review of Systems Review of Systems  Constitutional: Negative for appetite change, chills and fever.  Respiratory: Negative for shortness of breath.   Cardiovascular: Negative for chest pain.  Gastrointestinal: Positive for abdominal pain and diarrhea (loose, clay colored stools.  ). Negative for abdominal distention, blood in stool, nausea and vomiting.  Genitourinary: Negative for decreased urine volume, difficulty urinating, dysuria, flank pain, vaginal bleeding and vaginal discharge.  Musculoskeletal: Negative for back pain.  Skin: Negative for color change and rash.  Neurological: Negative for dizziness, weakness and numbness.  Hematological: Negative for adenopathy.     Physical Exam Updated Vital Signs BP (!) 126/92 (BP Location: Right Arm)   Pulse 99   Temp 98.5 F (36.9 C) (Oral)   Resp 17   Ht 5\' 4"  (1.626 m)   Wt 80.3 kg   LMP 02/20/2019 Comment: neg u preg   SpO2 96%   BMI 30.40 kg/m   Physical Exam Vitals signs and nursing note reviewed.  Constitutional:      Appearance: She is well-developed. She is not toxic-appearing.     Comments: Pt appears uncomfortable and  tearful   HENT:     Head: Atraumatic.     Mouth/Throat:     Mouth: Mucous membranes are moist.  Neck:     Musculoskeletal: Normal range of motion.  Cardiovascular:     Rate and Rhythm: Normal rate and regular rhythm.  Pulmonary:     Effort: Pulmonary effort is normal.  Breath sounds: Normal breath sounds.  Chest:     Chest wall: No tenderness.  Abdominal:     General: There is no distension.     Palpations: Abdomen is soft. There is no mass.     Tenderness: There is abdominal tenderness. There is no guarding.     Comments: Mild ttp across the entire lower abdomen.  No guarding or rebound.  The abdomen is soft.    Musculoskeletal: Normal range of motion.  Lymphadenopathy:     Cervical: No cervical adenopathy.  Skin:    General: Skin is warm.     Capillary Refill: Capillary refill takes less than 2 seconds.     Findings: No rash.  Neurological:     General: No focal deficit present.     Mental Status: She is alert.     Sensory: No sensory deficit.     Motor: No weakness.      ED Treatments / Results  Labs (all labs ordered are listed, but only abnormal results are displayed) Labs Reviewed  COMPREHENSIVE METABOLIC PANEL - Abnormal; Notable for the following components:      Result Value   Total Bilirubin 0.1 (*)    All other components within normal limits  CBC - Abnormal; Notable for the following components:   WBC 12.5 (*)    All other components within normal limits  URINALYSIS, ROUTINE W REFLEX MICROSCOPIC - Abnormal; Notable for the following components:   APPearance CLOUDY (*)    All other components within normal limits  LIPASE, BLOOD  PREGNANCY, URINE    EKG    Radiology Ct Abdomen Pelvis W Contrast  Result Date: 03/04/2019 CLINICAL DATA:  Epigastric pain, lower abdominal cramping, symptoms for 3 days, nausea, no vomiting, history acid reflux, asthma, irritable bowel syndrome, smoker EXAM: CT ABDOMEN AND PELVIS WITH CONTRAST TECHNIQUE: Multidetector  CT imaging of the abdomen and pelvis was performed using the standard protocol following bolus administration of intravenous contrast. Sagittal and coronal MPR images reconstructed from axial data set. CONTRAST:  100mL OMNIPAQUE IOHEXOL 300 MG/ML SOLN IV. No oral contrast. COMPARISON:  04/18/2017 FINDINGS: Lower chest: Lung bases clear Hepatobiliary: Gallbladder surgically absent. Liver normal appearance Pancreas: Normal appearance Spleen: Normal appearance Adrenals/Urinary Tract: Adrenal glands, kidneys, ureters, and bladder normal appearance Stomach/Bowel: Normal appendix. Minimal gaseous distention of distal esophagus, nonspecific. Stomach normal appearance. Sigmoid colon underdistended and unopacified, unable to exclude wall thickening though appearance appearance of the sigmoid colon is grossly similar to the previous study. Remaining bowel loops unremarkable. Vascular/Lymphatic: Vascular structures patent. Aorta normal caliber. Retroaortic LEFT renal vein. Reproductive: Normal appearing uterus and adnexa Other: Trace free pelvic fluid in cul-de-sac, likely physiologic. No free air. No hernia. Musculoskeletal: Unremarkable. IMPRESSION: No definite acute intra-abdominal or intrapelvic abnormalities as above. Electronically Signed   By: Ulyses SouthwardMark  Boles M.D.   On: 03/04/2019 20:43    Procedures Procedures (including critical care time)  Medications Ordered in ED Medications  sodium chloride flush (NS) 0.9 % injection 3 mL (3 mLs Intravenous Not Given 03/04/19 1924)  HYDROmorphone (DILAUDID) injection 1 mg (1 mg Intravenous Given 03/04/19 2008)  ondansetron (ZOFRAN) injection 4 mg (4 mg Intravenous Given 03/04/19 2008)  0.9 %  sodium chloride infusion ( Intravenous Stopped 03/04/19 2051)  iohexol (OMNIPAQUE) 300 MG/ML solution 100 mL (100 mLs Intravenous Contrast Given 03/04/19 2026)  HYDROmorphone (DILAUDID) injection 1 mg (1 mg Intravenous Given 03/04/19 2117)     Initial Impression / Assessment and Plan /  ED Course  I  have reviewed the triage vital signs and the nursing notes.  Pertinent labs & imaging results that were available during my care of the patient were reviewed by me and considered in my medical decision making (see chart for details).        Pt is well appearing,  vitals reassuring.  Has hx of IBS, here with lower abdominal pain and frequent clay colored stools today.  Work up today is reassuring.  Doubt surgical abdomen.  Gall bladder has been removed.  On my initial exam pt is tearful and appears uncomfortable  She called out to nursing that pain had returned after CT.  Additional medication ordered.  On my recheck of the pt, she is lying on the stretcher talking on cell phone.  I have reviewed the database, no recent narcotics prescribed.  Etiology of her pain is unclear.  I feel this may be related to her IBS.  She appears appropriate for d/c home.  Rx for Bentyl written and I have discussed important of close GI f/u.  Referral info provided.    Final Clinical Impressions(s) / ED Diagnoses   Final diagnoses:  Lower abdominal pain    ED Discharge Orders         Ordered    dicyclomine (BENTYL) 20 MG tablet  2 times daily     03/04/19 2120           Pauline Ausriplett, Luisenrique Conran, PA-C 03/04/19 2353    Raeford RazorKohut, Stephen, MD 03/05/19 1615

## 2019-03-27 ENCOUNTER — Ambulatory Visit (HOSPITAL_COMMUNITY): Payer: Medicaid Other | Admitting: Psychiatry

## 2019-03-29 DIAGNOSIS — K219 Gastro-esophageal reflux disease without esophagitis: Secondary | ICD-10-CM | POA: Diagnosis not present

## 2019-03-29 DIAGNOSIS — E559 Vitamin D deficiency, unspecified: Secondary | ICD-10-CM | POA: Diagnosis not present

## 2019-04-04 ENCOUNTER — Telehealth (HOSPITAL_COMMUNITY): Payer: Self-pay | Admitting: Psychiatry

## 2019-04-04 ENCOUNTER — Other Ambulatory Visit: Payer: Self-pay

## 2019-04-04 ENCOUNTER — Ambulatory Visit (HOSPITAL_COMMUNITY): Payer: Medicaid Other | Admitting: Psychiatry

## 2019-04-04 NOTE — Telephone Encounter (Signed)
Therapist sent text twice to patient for scheduled appointment, no response. Therapist called patient and left message indicating attempt to contact for scheduled appointment.

## 2019-04-18 ENCOUNTER — Ambulatory Visit (INDEPENDENT_AMBULATORY_CARE_PROVIDER_SITE_OTHER): Payer: Medicaid Other | Admitting: Psychiatry

## 2019-04-18 ENCOUNTER — Other Ambulatory Visit: Payer: Self-pay

## 2019-04-18 ENCOUNTER — Encounter (HOSPITAL_COMMUNITY): Payer: Self-pay | Admitting: Psychiatry

## 2019-04-18 DIAGNOSIS — F321 Major depressive disorder, single episode, moderate: Secondary | ICD-10-CM | POA: Diagnosis not present

## 2019-04-18 DIAGNOSIS — F4310 Post-traumatic stress disorder, unspecified: Secondary | ICD-10-CM

## 2019-04-18 MED ORDER — VORTIOXETINE HBR 5 MG PO TABS: 5.0000 mg | ORAL_TABLET | Freq: Every day | ORAL | 2 refills | Status: DC

## 2019-04-18 MED ORDER — ALPRAZOLAM 1 MG PO TABS: ORAL_TABLET | ORAL | 2 refills | Status: DC
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
## 2019-05-29 DIAGNOSIS — M2550 Pain in unspecified joint: Secondary | ICD-10-CM | POA: Diagnosis not present

## 2019-05-29 DIAGNOSIS — R05 Cough: Secondary | ICD-10-CM | POA: Diagnosis not present

## 2019-05-29 DIAGNOSIS — K219 Gastro-esophageal reflux disease without esophagitis: Secondary | ICD-10-CM | POA: Diagnosis not present

## 2019-06-13 ENCOUNTER — Other Ambulatory Visit: Payer: Self-pay

## 2019-06-13 DIAGNOSIS — N760 Acute vaginitis: Secondary | ICD-10-CM | POA: Diagnosis not present

## 2019-06-13 DIAGNOSIS — Z79899 Other long term (current) drug therapy: Secondary | ICD-10-CM | POA: Diagnosis not present

## 2019-06-13 DIAGNOSIS — J45909 Unspecified asthma, uncomplicated: Secondary | ICD-10-CM | POA: Diagnosis not present

## 2019-06-13 DIAGNOSIS — M545 Low back pain: Secondary | ICD-10-CM | POA: Diagnosis present

## 2019-06-13 DIAGNOSIS — Z91040 Latex allergy status: Secondary | ICD-10-CM | POA: Diagnosis not present

## 2019-06-13 DIAGNOSIS — R102 Pelvic and perineal pain: Secondary | ICD-10-CM | POA: Diagnosis not present

## 2019-06-13 DIAGNOSIS — F17210 Nicotine dependence, cigarettes, uncomplicated: Secondary | ICD-10-CM | POA: Insufficient documentation

## 2019-06-13 DIAGNOSIS — N939 Abnormal uterine and vaginal bleeding, unspecified: Secondary | ICD-10-CM | POA: Diagnosis not present

## 2019-06-14 ENCOUNTER — Other Ambulatory Visit: Payer: Self-pay | Admitting: Emergency Medicine

## 2019-06-14 ENCOUNTER — Emergency Department (HOSPITAL_COMMUNITY)
Admission: EM | Admit: 2019-06-14 | Discharge: 2019-06-14 | Disposition: A | Discharge status: Home / Self Care | Payer: Medicaid Other | Attending: Emergency Medicine | Admitting: Emergency Medicine

## 2019-06-14 ENCOUNTER — Other Ambulatory Visit: Payer: Self-pay

## 2019-06-14 ENCOUNTER — Encounter (HOSPITAL_COMMUNITY): Payer: Self-pay | Admitting: Emergency Medicine

## 2019-06-14 DIAGNOSIS — N939 Abnormal uterine and vaginal bleeding, unspecified: Secondary | ICD-10-CM

## 2019-06-14 DIAGNOSIS — N760 Acute vaginitis: Secondary | ICD-10-CM

## 2019-06-14 DIAGNOSIS — R102 Pelvic and perineal pain: Secondary | ICD-10-CM

## 2019-06-14 DIAGNOSIS — B9689 Other specified bacterial agents as the cause of diseases classified elsewhere: Secondary | ICD-10-CM

## 2019-06-14 LAB — URINALYSIS, ROUTINE W REFLEX MICROSCOPIC
Bilirubin Urine: NEGATIVE
Glucose, UA: NEGATIVE mg/dL
Hgb urine dipstick: NEGATIVE
Ketones, ur: NEGATIVE mg/dL
Leukocytes,Ua: NEGATIVE
Nitrite: NEGATIVE
Protein, ur: NEGATIVE mg/dL
Specific Gravity, Urine: 1.026 (ref 1.005–1.030)
pH: 5 (ref 5.0–8.0)

## 2019-06-14 LAB — WET PREP, GENITAL
Trich, Wet Prep: NONE SEEN
WBC, Wet Prep HPF POC: NONE SEEN
Yeast Wet Prep HPF POC: NONE SEEN

## 2019-06-14 LAB — POC URINE PREG, ED: Preg Test, Ur: NEGATIVE

## 2019-06-14 MED ORDER — METRONIDAZOLE 500 MG PO TABS: 500.0000 mg | ORAL_TABLET | Freq: Two times a day (BID) | ORAL | 0 refills | Status: DC

## 2019-06-14 MED ORDER — IBUPROFEN 800 MG PO TABS: 800.0000 mg | ORAL_TABLET | Freq: Four times a day (QID) | ORAL | 0 refills | Status: DC | PRN

## 2019-06-14 NOTE — ED Provider Notes (Signed)
[facility] EMERGENCY DEPARTMENT Provider Note   CSN: [id number] Arrival date & time: 06/13/19  2255     History   Chief Complaint Chief Complaint  Patient presents with  . Back Pain    HPI Karen Shields is a 28 y.o. female.     Patient presents to the emergency department for evaluation of low back pain as well as sharp lower abdominal and pelvic pains.  She reports that she has been noticing some urinary frequency.  She was recently treated for UTI with Cipro.  She had several episodes today where she had noticed "spotting" on the tissue when she wiped after urinating.  She then had an episode tonight where she noticed blood in the toilet with a large clot.  She is not currently menstruating or due for her cycle.     Past Medical History:  Diagnosis Date  . Anxiety   . Asthma    WELL CONTROLLED  . Asthma   . Depression    pt stated  . Eczema 2008  . Gallstones   . GERD (gastroesophageal reflux disease)   . Headache   . Hx MRSA infection 09-2014  . IBS (irritable bowel syndrome)   . Ovarian cyst     Patient Active Problem List   Diagnosis Date Noted  . MDD (major depressive disorder), recurrent episode, severe (HCC) 12/22/2017  . Gallstone   . RUQ pain 12/25/2015  . Gallstones   . Reflux esophagitis   . Nausea without vomiting 11/04/2015  . Cholelithiases 11/04/2015  . GERD (gastroesophageal reflux disease) 11/04/2015  . Early satiety 11/04/2015  . MRSA (methicillin resistant staph aureus) culture positive 12/11/2014  . Menstrual migraine without status migrainosus, not intractable 07/10/2014  . Anxiety 06/14/2013  . Maternal substance abuse 04/14/2013  . H/O cold sores 04/12/2013  . Ovarian cyst, left 02/09/2011    Past Surgical History:  Procedure Laterality Date  . CHOLECYSTECTOMY N/A 01/16/2016   Procedure: LAPAROSCOPIC CHOLECYSTECTOMY;  Surgeon: Lattie Haw, MD;  Location: [facility] ORS;  Service: General;  Laterality: N/A;  .  ESOPHAGOGASTRODUODENOSCOPY N/A 11/13/2015   YCX:KGYJEHU reflux   . OVARIAN CYST REMOVAL    . OVARIAN CYST REMOVAL  2011  . TUBAL LIGATION    . WISDOM TOOTH EXTRACTION       OB History    Gravida  2   Para  2   Term  1   Preterm  0   AB  0   Living  2     SAB  0   TAB  0   Ectopic  0   Multiple  0   Live Births  1            Home Medications    Prior to Admission medications   Medication Sig Start Date End Date Taking? Authorizing Provider  albuterol (PROVENTIL HFA;VENTOLIN HFA) 108 (90 BASE) MCG/ACT inhaler Inhale 2 puffs into the lungs every 6 (six) hours as needed for wheezing. 11/08/14   Campbell Riches, NP  ALPRAZolam Prudy Feeler) 1 MG tablet TAKE 1 TABLET BY MOUTH ONCE A DAY AS NEEDED FOR ANXIETY. 04/18/19   Myrlene Broker, MD  benzonatate (TESSALON) 100 MG capsule Take 1 capsule (100 mg total) by mouth every 8 (eight) hours. 02/20/19   Wurst, [place], PA-C  Cholecalciferol (VITAMIN D3) 5000 units CAPS Take by mouth.    [provider]  diclofenac (VOLTAREN) 75 MG EC tablet Take 1 tablet (75 mg total) by mouth 2 (two)  times daily. 11/29/18   Ivery QualeBryant, Hobson, PA-C  dicyclomine (BENTYL) 20 MG tablet Take 1 tablet (20 mg total) by mouth 2 (two) times daily. 03/04/19   Triplett, Tammy, PA-C  ibuprofen (ADVIL) 800 MG tablet Take 1 tablet (800 mg total) by mouth every 6 (six) hours as needed for moderate pain. 06/14/19   Gilda CreasePollina, Christopher J, MD  loratadine-pseudoephedrine (CLARITIN-D 12 HOUR) 5-120 MG tablet Take 1 tablet by mouth 2 (two) times daily. 10/09/18   Ivery QualeBryant, Hobson, PA-C  metroNIDAZOLE (FLAGYL) 500 MG tablet Take 1 tablet (500 mg total) by mouth 2 (two) times daily. One po bid x 7 days 06/14/19   Gilda CreasePollina, Christopher J, MD  nicotine polacrilex (NICORETTE) 2 MG gum Take 1 each (2 mg total) by mouth as needed for smoking cessation. 12/26/17   Oneta RackLewis, Tanika N, NP  Omeprazole (PRILOSEC PO) Take by mouth.    [provider]  ondansetron (ZOFRAN) 4 MG  tablet Take 1 tablet (4 mg total) by mouth every 6 (six) hours. 11/29/18   Ivery QualeBryant, Hobson, PA-C  vortioxetine HBr (TRINTELLIX) 5 MG TABS tablet Take 1 tablet (5 mg total) by mouth daily. 04/18/19   Myrlene Brokeross, Deborah R, MD    Family History Family History  Problem Relation Age of Onset  . Diabetes Mother   . Anxiety disorder Mother   . Depression Mother   . Depression Maternal Grandmother   . Colon cancer Neg Hx   . Crohn's disease Neg Hx   . Celiac disease Neg Hx     Social History Social History   Tobacco Use  . Smoking status: Current Every Day Smoker    Packs/day: 0.50    Years: 5.00    Pack years: 2.50    Types: Cigarettes    Start date: 02/11/2006  . Smokeless tobacco: Never Used  . Tobacco comment: Patient is already making efforts, already has access to [employer] , using gum and chantix  Substance Use Topics  . Alcohol use: Yes    Alcohol/week: 0.0 standard drinks    Comment: occassionally, last used in March 2019  . Drug use: Yes    Types: Benzodiazepines, Opium, Marijuana    Comment: occassional     Allergies   Latex   Review of Systems Review of Systems  Genitourinary: Positive for frequency and pelvic pain.  Musculoskeletal: Positive for back pain.  All other systems reviewed and are negative.    Physical Exam Updated Vital Signs BP (!) 136/92 (BP Location: Left Arm)   Pulse (!) 102   Temp 97.7 F (36.5 C) (Oral)   Resp 16   Ht 5\' 4"  (1.626 m)   Wt 78 kg   LMP 05/11/2019   SpO2 100%   BMI 29.52 kg/m   Physical Exam Vitals signs and nursing note reviewed. Exam conducted with a chaperone present.  Constitutional:      General: She is not in acute distress.    Appearance: Normal appearance. She is well-developed.  HENT:     Head: Normocephalic and atraumatic.     Right Ear: Hearing normal.     Left Ear: Hearing normal.     Nose: Nose normal.  Eyes:     Conjunctiva/sclera: Conjunctivae normal.     Pupils: Pupils are equal, round, and  reactive to light.  Neck:     Musculoskeletal: Normal range of motion and neck supple.  Cardiovascular:     Rate and Rhythm: Regular rhythm.     Heart sounds: S1 normal  and S2 normal. No murmur. No friction rub. No gallop.   Pulmonary:     Effort: Pulmonary effort is normal. No respiratory distress.     Breath sounds: Normal breath sounds.  Chest:     Chest wall: No tenderness.  Abdominal:     General: Bowel sounds are normal.     Palpations: Abdomen is soft.     Tenderness: There is no abdominal tenderness. There is no guarding or rebound. Negative signs include Murphy's sign and McBurney's sign.     Hernia: No hernia is present.  Genitourinary:    General: Normal vulva.     Exam position: Prone.     Vagina: Normal.     Cervix: Normal.     Uterus: Normal.      Adnexa: Right adnexa normal and left adnexa normal.  Musculoskeletal: Normal range of motion.  Skin:    General: Skin is warm and dry.     Findings: No rash.  Neurological:     Mental Status: She is alert and oriented to person, place, and time.     GCS: GCS eye subscore is 4. GCS verbal subscore is 5. GCS motor subscore is 6.     Cranial Nerves: No cranial nerve deficit.     Sensory: No sensory deficit.     Coordination: Coordination normal.  Psychiatric:        Speech: Speech normal.        Behavior: Behavior normal.        Thought Content: Thought content normal.      ED Treatments / Results  Labs (all labs ordered are listed, but only abnormal results are displayed) Labs Reviewed  WET PREP, GENITAL - Abnormal; Notable for the following components:      Result Value   Clue Cells Wet Prep HPF POC PRESENT (*)    All other components within normal limits  URINALYSIS, ROUTINE W REFLEX MICROSCOPIC - Abnormal; Notable for the following components:   APPearance HAZY (*)    All other components within normal limits  POC URINE PREG, ED  GC/CHLAMYDIA PROBE AMP ([place]) NOT AT [facility]    EKG None  Radiology  No results found.  Procedures Procedures (including critical care time)  Medications Ordered in ED Medications - No data to display   Initial Impression / Assessment and Plan / ED Course  I have reviewed the triage vital signs and the nursing notes.  Pertinent labs & imaging results that were available during my care of the patient were reviewed by me and considered in my medical decision making (see chart for details).        Patient presents to the emergency department with low back pain, pelvic pain and vaginal bleeding.  Patient reports that she had some spotting earlier today and then passed a large clot and some blood into the toilet this evening.  He does not have her menstrual cycle and she does not think that she is continuing to bleed at this time.  She was recently treated for urinary tract infection.  Urine today, however, does not suggest continued infection.  Pelvic exam was largely unremarkable.  This includes a closed cervical office without any blood present and no active bleeding.  She does have clue cells present.  Patient will require follow-up with OB/GYN (patient has an OB-GYN to call).  Final Clinical Impressions(s) / ED Diagnoses   Final diagnoses:  Pelvic pain  Abnormal uterine bleeding  Bacterial vaginosis    ED Discharge  Orders         Ordered    metroNIDAZOLE (FLAGYL) 500 MG tablet  2 times daily     06/14/19 0324    ibuprofen (ADVIL) 800 MG tablet  Every 6 hours PRN     06/14/19 0324           Gilda Crease, MD 06/14/19 [phone]

## 2019-06-14 NOTE — ED Triage Notes (Signed)
Pt C/O lower back pain that is sharp and happens intermittently. Pt states that she passed what looked like "big clots or tissue." Pt states she is not on her menstrual cycle. Pt also states she has had her tubes tied.

## 2019-06-15 LAB — CERVICOVAGINAL ANCILLARY ONLY
Chlamydia: NEGATIVE
Neisseria Gonorrhea: NEGATIVE

## 2019-07-13 DIAGNOSIS — R1084 Generalized abdominal pain: Secondary | ICD-10-CM | POA: Diagnosis not present

## 2019-07-27 DIAGNOSIS — E559 Vitamin D deficiency, unspecified: Secondary | ICD-10-CM | POA: Diagnosis not present

## 2019-07-27 DIAGNOSIS — K219 Gastro-esophageal reflux disease without esophagitis: Secondary | ICD-10-CM | POA: Diagnosis not present

## 2019-08-03 ENCOUNTER — Other Ambulatory Visit (HOSPITAL_COMMUNITY): Payer: Self-pay | Admitting: Psychiatry

## 2019-08-03 ENCOUNTER — Telehealth (HOSPITAL_COMMUNITY): Payer: Self-pay | Admitting: *Deleted

## 2019-08-03 MED ORDER — VORTIOXETINE HBR 5 MG PO TABS: 5.0000 mg | ORAL_TABLET | Freq: Every day | ORAL | 2 refills | Status: DC

## 2019-08-03 NOTE — Telephone Encounter (Signed)
PATIENT CALLED REQUESTING REFILLS . STATED TOLD BY Rx THAT SHE HAS O REFILLS ON HER MED'S , NEXT APPT 08/22/2019

## 2019-08-03 NOTE — Telephone Encounter (Signed)
sent 

## 2019-08-03 NOTE — Telephone Encounter (Signed)
PATIENT SCHEDULE FOR APPT 08/22/2019

## 2019-08-03 NOTE — Telephone Encounter (Signed)
Call pt for appt 

## 2019-08-06 ENCOUNTER — Telehealth (HOSPITAL_COMMUNITY): Payer: Self-pay | Admitting: *Deleted

## 2019-08-06 NOTE — Telephone Encounter (Signed)
[employer] TRACK[employer] APPROVED TRINTELLIX 5 MG TABLET  P.A. # : [id number]  [id number]  [id number]     EFFECTIVE: 08-06-2019  THRU  07-31-2020

## 2019-08-20 ENCOUNTER — Other Ambulatory Visit: Payer: Self-pay

## 2019-08-20 ENCOUNTER — Telehealth (HOSPITAL_COMMUNITY): Payer: Self-pay | Admitting: Psychiatry

## 2019-08-20 ENCOUNTER — Ambulatory Visit (HOSPITAL_COMMUNITY): Payer: Medicaid Other | Admitting: Psychiatry

## 2019-08-20 NOTE — Telephone Encounter (Signed)
Therapist called patient for scheduled appointment, received voicemail response.  Therapist left message indicating attempt and requesting patient call office.

## 2019-08-22 ENCOUNTER — Ambulatory Visit (INDEPENDENT_AMBULATORY_CARE_PROVIDER_SITE_OTHER): Payer: Medicaid Other | Admitting: Psychiatry

## 2019-08-22 ENCOUNTER — Encounter (HOSPITAL_COMMUNITY): Payer: Self-pay | Admitting: Psychiatry

## 2019-08-22 ENCOUNTER — Other Ambulatory Visit: Payer: Self-pay

## 2019-08-22 DIAGNOSIS — F4310 Post-traumatic stress disorder, unspecified: Secondary | ICD-10-CM

## 2019-08-22 DIAGNOSIS — F321 Major depressive disorder, single episode, moderate: Secondary | ICD-10-CM

## 2019-08-22 MED ORDER — ALPRAZOLAM 1 MG PO TABS: 1.0000 mg | ORAL_TABLET | Freq: Every day | ORAL | 2 refills | Status: DC | PRN

## 2019-08-22 MED ORDER — VORTIOXETINE HBR 10 MG PO TABS: 10.0000 mg | ORAL_TABLET | Freq: Every day | ORAL | 2 refills | Status: DC
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
## 2019-08-23 ENCOUNTER — Telehealth (HOSPITAL_COMMUNITY): Payer: Self-pay | Admitting: *Deleted

## 2019-08-23 NOTE — Telephone Encounter (Signed)
[employer] TRACK[employer] APPROVED TRINTELLIX 10 MG TABLETS  P.A. [id number]  EFFECTIVE: 08-23-2019    THRU    08-17-2020

## 2019-09-05 ENCOUNTER — Ambulatory Visit (INDEPENDENT_AMBULATORY_CARE_PROVIDER_SITE_OTHER): Payer: Medicaid Other | Admitting: Psychiatry

## 2019-09-05 ENCOUNTER — Other Ambulatory Visit: Payer: Self-pay

## 2019-09-05 DIAGNOSIS — F321 Major depressive disorder, single episode, moderate: Secondary | ICD-10-CM
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
## 2019-09-06 DIAGNOSIS — Z113 Encounter for screening for infections with a predominantly sexual mode of transmission: Secondary | ICD-10-CM | POA: Diagnosis not present

## 2019-09-06 DIAGNOSIS — R300 Dysuria: Secondary | ICD-10-CM | POA: Diagnosis not present

## 2019-09-26 DIAGNOSIS — J449 Chronic obstructive pulmonary disease, unspecified: Secondary | ICD-10-CM | POA: Diagnosis not present

## 2019-09-26 DIAGNOSIS — F418 Other specified anxiety disorders: Secondary | ICD-10-CM | POA: Diagnosis not present

## 2019-10-03 DIAGNOSIS — Z1331 Encounter for screening for depression: Secondary | ICD-10-CM | POA: Diagnosis not present

## 2019-10-03 DIAGNOSIS — Z01419 Encounter for gynecological examination (general) (routine) without abnormal findings: Secondary | ICD-10-CM | POA: Diagnosis not present

## 2019-10-03 DIAGNOSIS — Z0000 Encounter for general adult medical examination without abnormal findings: Secondary | ICD-10-CM | POA: Diagnosis not present

## 2019-10-03 DIAGNOSIS — R8290 Unspecified abnormal findings in urine: Secondary | ICD-10-CM | POA: Diagnosis not present

## 2019-10-03 DIAGNOSIS — R5383 Other fatigue: Secondary | ICD-10-CM | POA: Diagnosis not present

## 2019-10-03 DIAGNOSIS — Z113 Encounter for screening for infections with a predominantly sexual mode of transmission: Secondary | ICD-10-CM | POA: Diagnosis not present

## 2019-10-03 DIAGNOSIS — N3000 Acute cystitis without hematuria: Secondary | ICD-10-CM | POA: Diagnosis not present

## 2019-10-24 ENCOUNTER — Other Ambulatory Visit: Payer: Self-pay

## 2019-10-24 ENCOUNTER — Telehealth (HOSPITAL_COMMUNITY): Payer: Self-pay | Admitting: Psychiatry

## 2019-10-24 ENCOUNTER — Ambulatory Visit (HOSPITAL_COMMUNITY): Payer: Medicaid Other | Admitting: Psychiatry

## 2019-10-29 DIAGNOSIS — R8290 Unspecified abnormal findings in urine: Secondary | ICD-10-CM | POA: Diagnosis not present

## 2019-10-29 DIAGNOSIS — Z87440 Personal history of urinary (tract) infections: Secondary | ICD-10-CM | POA: Diagnosis not present

## 2019-11-01 ENCOUNTER — Ambulatory Visit (INDEPENDENT_AMBULATORY_CARE_PROVIDER_SITE_OTHER): Payer: Medicaid Other | Admitting: Psychiatry

## 2019-11-01 ENCOUNTER — Other Ambulatory Visit: Payer: Self-pay

## 2019-11-01 DIAGNOSIS — F4310 Post-traumatic stress disorder, unspecified: Secondary | ICD-10-CM

## 2019-11-01 DIAGNOSIS — F321 Major depressive disorder, single episode, moderate: Secondary | ICD-10-CM | POA: Diagnosis not present
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
## 2019-11-02 ENCOUNTER — Ambulatory Visit (INDEPENDENT_AMBULATORY_CARE_PROVIDER_SITE_OTHER): Payer: Medicaid Other | Admitting: Psychiatry

## 2019-11-02 ENCOUNTER — Encounter (HOSPITAL_COMMUNITY): Payer: Self-pay | Admitting: Psychiatry

## 2019-11-02 ENCOUNTER — Other Ambulatory Visit: Payer: Self-pay

## 2019-11-02 DIAGNOSIS — F4310 Post-traumatic stress disorder, unspecified: Secondary | ICD-10-CM

## 2019-11-02 DIAGNOSIS — F321 Major depressive disorder, single episode, moderate: Secondary | ICD-10-CM | POA: Diagnosis not present

## 2019-11-02 MED ORDER — VORTIOXETINE HBR 10 MG PO TABS: 10.0000 mg | ORAL_TABLET | Freq: Every day | ORAL | 2 refills | Status: DC

## 2019-11-02 MED ORDER — ALPRAZOLAM 1 MG PO TABS: 1.0000 mg | ORAL_TABLET | Freq: Every day | ORAL | 2 refills | Status: DC | PRN
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
## 2019-11-12 ENCOUNTER — Other Ambulatory Visit: Payer: Self-pay

## 2019-11-12 ENCOUNTER — Ambulatory Visit: Payer: Medicaid Other | Attending: Internal Medicine

## 2019-11-12 DIAGNOSIS — Z20822 Contact with and (suspected) exposure to covid-19: Secondary | ICD-10-CM

## 2019-11-13 LAB — NOVEL CORONAVIRUS, NAA: SARS-CoV-2, NAA: NOT DETECTED

## 2019-11-14 ENCOUNTER — Telehealth: Payer: Self-pay | Admitting: General Practice

## 2019-11-14 NOTE — Telephone Encounter (Signed)
Negative COVID results given. Patient results "NOT Detected." Caller expressed understanding. ° °

## 2019-11-27 ENCOUNTER — Ambulatory Visit (INDEPENDENT_AMBULATORY_CARE_PROVIDER_SITE_OTHER): Payer: Medicaid Other | Admitting: Psychiatry

## 2019-11-27 ENCOUNTER — Encounter (HOSPITAL_COMMUNITY): Payer: Self-pay | Admitting: Psychiatry

## 2019-11-27 ENCOUNTER — Other Ambulatory Visit: Payer: Self-pay

## 2019-11-27 DIAGNOSIS — F4310 Post-traumatic stress disorder, unspecified: Secondary | ICD-10-CM

## 2019-11-27 DIAGNOSIS — F321 Major depressive disorder, single episode, moderate: Secondary | ICD-10-CM | POA: Diagnosis not present

## 2019-11-27 DIAGNOSIS — F418 Other specified anxiety disorders: Secondary | ICD-10-CM | POA: Diagnosis not present

## 2019-11-27 DIAGNOSIS — F902 Attention-deficit hyperactivity disorder, combined type: Secondary | ICD-10-CM | POA: Diagnosis not present

## 2019-11-27 DIAGNOSIS — R5382 Chronic fatigue, unspecified: Secondary | ICD-10-CM | POA: Diagnosis not present
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
## 2019-11-30 DIAGNOSIS — R351 Nocturia: Secondary | ICD-10-CM | POA: Diagnosis not present

## 2019-11-30 DIAGNOSIS — R350 Frequency of micturition: Secondary | ICD-10-CM | POA: Diagnosis not present

## 2019-12-11 ENCOUNTER — Ambulatory Visit (HOSPITAL_COMMUNITY): Payer: Medicaid Other | Admitting: Psychiatry

## 2019-12-25 ENCOUNTER — Ambulatory Visit (HOSPITAL_COMMUNITY): Payer: Medicaid Other | Admitting: Psychiatry

## 2019-12-31 ENCOUNTER — Ambulatory Visit (HOSPITAL_COMMUNITY): Payer: Medicaid Other | Admitting: Psychiatry

## 2020-01-01 ENCOUNTER — Telehealth (HOSPITAL_COMMUNITY): Payer: Self-pay | Admitting: Psychiatry

## 2020-01-01 ENCOUNTER — Ambulatory Visit (HOSPITAL_COMMUNITY): Payer: Medicaid Other | Admitting: Psychiatry

## 2020-01-01 ENCOUNTER — Other Ambulatory Visit: Payer: Self-pay

## 2020-01-01 NOTE — Telephone Encounter (Signed)
Therapist attempted to contact patient twice via text through [email or web address] platform, no response.  Therapist left message indicating attempt and requesting patient call office.

## 2020-01-10 ENCOUNTER — Telehealth (INDEPENDENT_AMBULATORY_CARE_PROVIDER_SITE_OTHER): Payer: Medicaid Other | Admitting: Psychiatry

## 2020-01-10 ENCOUNTER — Other Ambulatory Visit: Payer: Self-pay

## 2020-01-10 DIAGNOSIS — F4310 Post-traumatic stress disorder, unspecified: Secondary | ICD-10-CM | POA: Diagnosis not present

## 2020-01-10 DIAGNOSIS — F321 Major depressive disorder, single episode, moderate: Secondary | ICD-10-CM
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
## 2020-01-17 DIAGNOSIS — K219 Gastro-esophageal reflux disease without esophagitis: Secondary | ICD-10-CM | POA: Diagnosis not present

## 2020-01-17 DIAGNOSIS — M2550 Pain in unspecified joint: Secondary | ICD-10-CM | POA: Diagnosis not present

## 2020-01-17 DIAGNOSIS — R5382 Chronic fatigue, unspecified: Secondary | ICD-10-CM | POA: Diagnosis not present

## 2020-01-17 DIAGNOSIS — E7849 Other hyperlipidemia: Secondary | ICD-10-CM | POA: Diagnosis not present

## 2020-01-27 ENCOUNTER — Emergency Department (HOSPITAL_COMMUNITY)
Admission: EM | Admit: 2020-01-27 | Discharge: 2020-01-27 | Disposition: A | Discharge status: Home / Self Care | Payer: Medicaid Other | Attending: Emergency Medicine | Admitting: Emergency Medicine

## 2020-01-27 ENCOUNTER — Encounter (HOSPITAL_COMMUNITY): Payer: Self-pay | Admitting: Emergency Medicine

## 2020-01-27 ENCOUNTER — Other Ambulatory Visit: Payer: Self-pay

## 2020-01-27 DIAGNOSIS — Y9389 Activity, other specified: Secondary | ICD-10-CM | POA: Insufficient documentation

## 2020-01-27 DIAGNOSIS — X102XXA Contact with fats and cooking oils, initial encounter: Secondary | ICD-10-CM | POA: Diagnosis not present

## 2020-01-27 DIAGNOSIS — Y999 Unspecified external cause status: Secondary | ICD-10-CM | POA: Diagnosis not present

## 2020-01-27 DIAGNOSIS — T887XXA Unspecified adverse effect of drug or medicament, initial encounter: Secondary | ICD-10-CM

## 2020-01-27 DIAGNOSIS — J45909 Unspecified asthma, uncomplicated: Secondary | ICD-10-CM | POA: Insufficient documentation

## 2020-01-27 DIAGNOSIS — Z23 Encounter for immunization: Secondary | ICD-10-CM | POA: Insufficient documentation

## 2020-01-27 DIAGNOSIS — N946 Dysmenorrhea, unspecified: Secondary | ICD-10-CM | POA: Diagnosis not present

## 2020-01-27 DIAGNOSIS — R252 Cramp and spasm: Secondary | ICD-10-CM | POA: Diagnosis not present

## 2020-01-27 DIAGNOSIS — T310 Burns involving less than 10% of body surface: Secondary | ICD-10-CM | POA: Diagnosis not present

## 2020-01-27 DIAGNOSIS — F5109 Other insomnia not due to a substance or known physiological condition: Secondary | ICD-10-CM | POA: Insufficient documentation

## 2020-01-27 DIAGNOSIS — M79605 Pain in left leg: Secondary | ICD-10-CM | POA: Diagnosis not present

## 2020-01-27 DIAGNOSIS — T22212A Burn of second degree of left forearm, initial encounter: Secondary | ICD-10-CM

## 2020-01-27 DIAGNOSIS — F17210 Nicotine dependence, cigarettes, uncomplicated: Secondary | ICD-10-CM | POA: Insufficient documentation

## 2020-01-27 DIAGNOSIS — Z79899 Other long term (current) drug therapy: Secondary | ICD-10-CM | POA: Insufficient documentation

## 2020-01-27 DIAGNOSIS — Y92511 Restaurant or cafe as the place of occurrence of the external cause: Secondary | ICD-10-CM | POA: Insufficient documentation

## 2020-01-27 DIAGNOSIS — S59912A Unspecified injury of left forearm, initial encounter: Secondary | ICD-10-CM | POA: Diagnosis present

## 2020-01-27 LAB — CBC WITH DIFFERENTIAL/PLATELET
Abs Immature Granulocytes: 0.09 10*3/uL — ABNORMAL HIGH (ref 0.00–0.07)
Basophils Absolute: 0.1 10*3/uL (ref 0.0–0.1)
Basophils Relative: 0 %
Eosinophils Absolute: 0.1 10*3/uL (ref 0.0–0.5)
Eosinophils Relative: 1 %
HCT: 39.9 % (ref 36.0–46.0)
Hemoglobin: 13.1 g/dL (ref 12.0–15.0)
Immature Granulocytes: 1 %
Lymphocytes Relative: 11 %
Lymphs Abs: 2 10*3/uL (ref 0.7–4.0)
MCH: 29 pg (ref 26.0–34.0)
MCHC: 32.8 g/dL (ref 30.0–36.0)
MCV: 88.3 fL (ref 80.0–100.0)
Monocytes Absolute: 0.6 10*3/uL (ref 0.1–1.0)
Monocytes Relative: 3 %
Neutro Abs: 15.9 10*3/uL — ABNORMAL HIGH (ref 1.7–7.7)
Neutrophils Relative %: 84 %
Platelets: 388 10*3/uL (ref 150–400)
RBC: 4.52 MIL/uL (ref 3.87–5.11)
RDW: 12.8 % (ref 11.5–15.5)
WBC: 18.8 10*3/uL — ABNORMAL HIGH (ref 4.0–10.5)
nRBC: 0 % (ref 0.0–0.2)

## 2020-01-27 LAB — COMPREHENSIVE METABOLIC PANEL
ALT: 25 U/L (ref 0–44)
AST: 30 U/L (ref 15–41)
Albumin: 4.1 g/dL (ref 3.5–5.0)
Alkaline Phosphatase: 79 U/L (ref 38–126)
Anion gap: 11 (ref 5–15)
BUN: 16 mg/dL (ref 6–20)
CO2: 28 mmol/L (ref 22–32)
Calcium: 9.8 mg/dL (ref 8.9–10.3)
Chloride: 97 mmol/L — ABNORMAL LOW (ref 98–111)
Creatinine, Ser: 0.8 mg/dL (ref 0.44–1.00)
GFR calc Af Amer: >60 mL/min (ref >60)
GFR calc non Af Amer: >60 mL/min (ref >60)
Glucose, Bld: 112 mg/dL — ABNORMAL HIGH (ref 70–99)
Potassium: 3.8 mmol/L (ref 3.5–5.1)
Sodium: 136 mmol/L (ref 135–145)
Total Bilirubin: 0.4 mg/dL (ref 0.3–1.2)
Total Protein: 7.2 g/dL (ref 6.5–8.1)

## 2020-01-27 LAB — MAGNESIUM: Magnesium: 2 mg/dL (ref 1.7–2.4)

## 2020-01-27 MED ORDER — KETOROLAC TROMETHAMINE 30 MG/ML IJ SOLN
30.0000 mg | Freq: Once | INTRAMUSCULAR | Status: AC
  Administered 2020-01-27: 04:00:00 30 mg via INTRAMUSCULAR
  Filled 2020-01-27: qty 1

## 2020-01-27 MED ORDER — SILVER SULFADIAZINE 1 % EX CREA
TOPICAL_CREAM | Freq: Once | CUTANEOUS | Status: AC
  Filled 2020-01-27: qty 50

## 2020-01-27 MED ORDER — TETANUS-DIPHTH-ACELL PERTUSSIS 5-2.5-18.5 LF-MCG/0.5 IM SUSP
0.5000 mL | Freq: Once | INTRAMUSCULAR | Status: AC
  Administered 2020-01-27: 04:00:00 0.5 mL via INTRAMUSCULAR
  Filled 2020-01-27: qty 0.5
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
## 2020-01-27 NOTE — Discharge Instructions (Addendum)
Drink plenty of fluids.  You can take ibuprofen 600 mg 4 times a day OR Aleve 2 tablets over-the-counter twice a day for menstrual cramps or pain in your leg.  You should change the dressing on the burn daily, use the Silvadene until it is gone and then you can use triple antibiotic ointment.  Recheck if it appears to be getting infected.  Your electrolytes look good.  Use ice and heat for comfort over your sore calf.  Please follow-up with Dr. Janna Arch to finish the evaluation of the pain and numbness in your leg.
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
## 2020-01-28 ENCOUNTER — Telehealth (INDEPENDENT_AMBULATORY_CARE_PROVIDER_SITE_OTHER): Payer: Medicaid Other | Admitting: Psychiatry

## 2020-01-28 ENCOUNTER — Other Ambulatory Visit: Payer: Self-pay

## 2020-01-28 ENCOUNTER — Encounter (HOSPITAL_COMMUNITY): Payer: Self-pay | Admitting: Psychiatry

## 2020-01-28 DIAGNOSIS — F321 Major depressive disorder, single episode, moderate: Secondary | ICD-10-CM

## 2020-01-28 MED ORDER — VORTIOXETINE HBR 10 MG PO TABS: 10.0000 mg | ORAL_TABLET | Freq: Every day | ORAL | 2 refills | Status: DC

## 2020-01-28 MED ORDER — ALPRAZOLAM 1 MG PO TABS: 1.0000 mg | ORAL_TABLET | Freq: Every day | ORAL | 2 refills | Status: DC | PRN
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
## 2020-04-01 DIAGNOSIS — E559 Vitamin D deficiency, unspecified: Secondary | ICD-10-CM | POA: Diagnosis not present

## 2020-04-01 DIAGNOSIS — R5382 Chronic fatigue, unspecified: Secondary | ICD-10-CM | POA: Diagnosis not present

## 2020-04-01 DIAGNOSIS — R569 Unspecified convulsions: Secondary | ICD-10-CM | POA: Diagnosis not present

## 2020-04-01 DIAGNOSIS — N390 Urinary tract infection, site not specified: Secondary | ICD-10-CM | POA: Diagnosis not present

## 2020-04-10 ENCOUNTER — Ambulatory Visit (INDEPENDENT_AMBULATORY_CARE_PROVIDER_SITE_OTHER): Payer: Medicaid Other | Admitting: Psychiatry

## 2020-04-10 ENCOUNTER — Other Ambulatory Visit: Payer: Self-pay

## 2020-04-10 DIAGNOSIS — F321 Major depressive disorder, single episode, moderate: Secondary | ICD-10-CM | POA: Diagnosis not present

## 2020-04-10 DIAGNOSIS — F4310 Post-traumatic stress disorder, unspecified: Secondary | ICD-10-CM
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
## 2020-04-21 ENCOUNTER — Other Ambulatory Visit: Payer: Self-pay

## 2020-04-21 ENCOUNTER — Telehealth (HOSPITAL_COMMUNITY): Payer: Self-pay | Admitting: Psychiatry

## 2020-04-21 ENCOUNTER — Telehealth (HOSPITAL_COMMUNITY): Payer: Medicaid Other | Admitting: Psychiatry

## 2020-04-21 DIAGNOSIS — Z91199 Patient's noncompliance with other medical treatment and regimen due to unspecified reason: Secondary | ICD-10-CM

## 2020-04-22 ENCOUNTER — Telehealth (HOSPITAL_COMMUNITY): Payer: Self-pay | Admitting: Psychiatry

## 2020-04-22 NOTE — Telephone Encounter (Signed)
Patient called in advised she is out of medications, and would like to increase the trintellix from 10mg  to 20mg  due to feeling more depressed, did advised must speak with provider during visit. This patient has an appt 04/23/2020 at 8:30

## 2020-04-22 NOTE — Telephone Encounter (Signed)
noted 

## 2020-04-22 NOTE — Telephone Encounter (Signed)
Ok, she missed appt yesterday

## 2020-04-23 ENCOUNTER — Ambulatory Visit
Admission: EM | Admit: 2020-04-23 | Discharge: 2020-04-23 | Disposition: A | Discharge status: Home / Self Care | Payer: Medicaid Other | Attending: Emergency Medicine | Admitting: Emergency Medicine

## 2020-04-23 ENCOUNTER — Telehealth (INDEPENDENT_AMBULATORY_CARE_PROVIDER_SITE_OTHER): Payer: Medicaid Other | Admitting: Psychiatry

## 2020-04-23 ENCOUNTER — Other Ambulatory Visit: Payer: Self-pay

## 2020-04-23 ENCOUNTER — Other Ambulatory Visit (HOSPITAL_COMMUNITY): Payer: Self-pay | Admitting: Psychiatry

## 2020-04-23 DIAGNOSIS — R5383 Other fatigue: Secondary | ICD-10-CM | POA: Diagnosis not present

## 2020-04-23 DIAGNOSIS — Z5329 Procedure and treatment not carried out because of patient's decision for other reasons: Secondary | ICD-10-CM

## 2020-04-23 NOTE — ED Provider Notes (Signed)
[facility]   [id number] 04/23/20 Arrival Time: 1612   Chief Complaint  Patient presents with  . Fatigue     SUBJECTIVE: History from: patient.  Karen Shields is a 29 y.o. female who presented to the urgent care with a complaint of fatigue, and poor appetite for the past few days.  Report history of depression, low vitamin D and B12.  Denies any precipitating event.denies additional suicidal thoughts.  She has tried OTC medications without relief.  Her symptoms are made worse with walking or movement.  He denies similar symptoms in the past.  Denies chills, fever, nausea, vomiting, diarrhea.   ROS: As per HPI.  All other pertinent ROS negative.      Past Medical History:  Diagnosis Date  . Anxiety   . Asthma    WELL CONTROLLED  . Asthma   . Depression    pt stated  . Eczema 2008  . Gallstones   . GERD (gastroesophageal reflux disease)   . Headache   . Hx MRSA infection 09-2014  . IBS (irritable bowel syndrome)   . Ovarian cyst    Past Surgical History:  Procedure Laterality Date  . CHOLECYSTECTOMY N/A 01/16/2016   Procedure: LAPAROSCOPIC CHOLECYSTECTOMY;  Surgeon: Lattie Haw, MD;  Location: [facility] ORS;  Service: General;  Laterality: N/A;  . ESOPHAGOGASTRODUODENOSCOPY N/A 11/13/2015   BTD:HRCBULA reflux   . OVARIAN CYST REMOVAL    . OVARIAN CYST REMOVAL  2011  . TUBAL LIGATION    . WISDOM TOOTH EXTRACTION     Allergies  Allergen Reactions  . Latex Dermatitis   No current facility-administered medications on file prior to encounter.   Current Outpatient Medications on File Prior to Encounter  Medication Sig Dispense Refill  . albuterol (PROVENTIL HFA;VENTOLIN HFA) 108 (90 BASE) MCG/ACT inhaler Inhale 2 puffs into the lungs every 6 (six) hours as needed for wheezing. 18 g 5  . ALPRAZolam (XANAX) 1 MG tablet Take 1 tablet (1 mg total) by mouth daily as needed for anxiety. 30 tablet 2  . amphetamine-dextroamphetamine (ADDERALL) 20 MG tablet Take 10  mg by mouth 3 (three) times daily.    . benzonatate (TESSALON) 100 MG capsule Take 1 capsule (100 mg total) by mouth every 8 (eight) hours. 21 capsule 0  . Cholecalciferol (VITAMIN D3) 5000 units CAPS Take by mouth.    . diclofenac (VOLTAREN) 75 MG EC tablet Take 1 tablet (75 mg total) by mouth 2 (two) times daily. 12 tablet 0  . dicyclomine (BENTYL) 20 MG tablet Take 1 tablet (20 mg total) by mouth 2 (two) times daily. 20 tablet 0  . ibuprofen (ADVIL) 800 MG tablet Take 1 tablet (800 mg total) by mouth every 6 (six) hours as needed for moderate pain. 20 tablet 0  . loratadine-pseudoephedrine (CLARITIN-D 12 HOUR) 5-120 MG tablet Take 1 tablet by mouth 2 (two) times daily. 20 tablet 0  . metroNIDAZOLE (FLAGYL) 500 MG tablet Take 1 tablet (500 mg total) by mouth 2 (two) times daily. One po bid x 7 days 14 tablet 0  . nicotine polacrilex (NICORETTE) 2 MG gum Take 1 each (2 mg total) by mouth as needed for smoking cessation. (Patient not taking: Reported on 01/10/2020) 100 tablet 0  . Omeprazole (PRILOSEC PO) Take by mouth.    . ondansetron (ZOFRAN) 4 MG tablet Take 1 tablet (4 mg total) by mouth every 6 (six) hours. 12 tablet 0  . vortioxetine HBr (TRINTELLIX) 10 MG TABS tablet Take 1 tablet (  10 mg total) by mouth daily. 30 tablet 2   Social History   Socioeconomic History  . Marital status: Legally Separated    Spouse name: Not on file  . Number of children: 2  . Years of education: Not on file  . Highest education level: Not on file  Occupational History  . Not on file  Tobacco Use  . Smoking status: Current Every Day Smoker    Packs/day: 0.50    Years: 5.00    Pack years: 2.50    Types: Cigarettes    Start date: 02/11/2006  . Smokeless tobacco: Never Used  . Tobacco comment: Patient is already making efforts, already has access to [employer] , using gum and chantix  Vaping Use  . Vaping Use: Never used  Substance and Sexual Activity  . Alcohol use: Yes    Alcohol/week: 0.0  standard drinks    Comment: occassionally, last used in March 2019  . Drug use: Not Currently    Types: Benzodiazepines, Opium  . Sexual activity: Yes    Partners: Male    Birth control/protection: Surgical  Other Topics Concern  . Not on file  Social History Narrative   ** Merged History Encounter **    Four daughters between herself and husband, two Human resources officer.    Social Determinants of Health   Financial Resource Strain:   . Difficulty of Paying Living Expenses:   Food Insecurity:   . Worried About Programme researcher, broadcasting/film/video in the Last Year:   . Barista in the Last Year:   Transportation Needs:   . Freight forwarder (Medical):   Marland Kitchen Lack of Transportation (Non-Medical):   Physical Activity:   . Days of Exercise per Week:   . Minutes of Exercise per Session:   Stress:   . Feeling of Stress :   Social Connections:   . Frequency of Communication with Friends and Family:   . Frequency of Social Gatherings with Friends and Family:   . Attends Religious Services:   . Active Member of Clubs or Organizations:   . Attends Banker Meetings:   Marland Kitchen Marital Status:   Intimate Partner Violence:   . Fear of Current or Ex-Partner:   . Emotionally Abused:   Marland Kitchen Physically Abused:   . Sexually Abused:    Family History  Problem Relation Age of Onset  . Diabetes Mother   . Anxiety disorder Mother   . Depression Mother   . Depression Maternal Grandmother   . Colon cancer Neg Hx   . Crohn's disease Neg Hx   . Celiac disease Neg Hx     OBJECTIVE:  Vitals:   04/23/20 1650  BP: 118/81  Pulse: 100  Resp: 20  Temp: 98.1 F (36.7 C)  SpO2: 100%     General appearance: alert; appears fatigued, but nontoxic; speaking in full sentences and tolerating own secretions HEENT: NCAT; Ears: EACs clear, TMs pearly gray; Eyes: PERRL.  EOM grossly intact. Sinuses: nontender; Nose: nares patent without rhinorrhea, Throat: oropharynx clear, tonsils non erythematous or  enlarged, uvula midline  Neck: supple without LAD Lungs: unlabored respirations, symmetrical air entry;  no respiratory distress; CTAB Heart: regular rate and rhythm.  Radial pulses 2+ symmetrical bilaterally Skin: warm and dry Psychological: alert and cooperative; normal mood and affect  LABS:  No results found for this or any previous visit (from the past 24 hour(s)).   ASSESSMENT & PLAN:  1. Other fatigue  No orders of the defined types were placed in this encounter. Patient stable at discharge.  CBC, CMP and TSH were ordered in the urgent care.  She was advised to follow PCP to have vitamin B 12 and D lab order.  Work note was given  Discharge instructions Rest and drink fluids.  Drink at least half your body weight in ounces Eat a well-balanced diet of fruits, vegetable and lean meats Try exercising at least 3x/ week for approximately 30 minutes Blood work obtained including CBC with diff, CMP, and TSH.  We will call you abnormal results.  Otherwise follow up with your PCP for further evaluation and management of your symptoms.   Return or go to ER if you have any new or worsening symptoms such as fever, chills, worsening fatigue, lightheadedness, dizziness, shortness of breath, wheezing, chest pain, nausea, vomiting, abdominal pain, changes in bowel or bladder habits, blood in stool, dark tarry stools, etc...   Reviewed expectations re: course of current medical issues. Questions answered. Outlined signs and symptoms indicating need for more acute intervention. Patient verbalized understanding. After Visit Summary given.      Note: This document was prepared using Dragon voice recognition software and may include unintentional dictation errors.    Durward Parcel, FNP 04/23/20 1800

## 2020-04-23 NOTE — ED Triage Notes (Signed)
Pt has multiple complaints, c/o fatigue, stomach cramps poor appetite vitamin d deficiency

## 2020-04-23 NOTE — Discharge Instructions (Addendum)
Rest and drink fluids.  Drink at least half your body weight in ounces °Eat a well-balanced diet of fruits, vegetable and lean meats °Try exercising at least 3x/ week for approximately 30 minutes °Blood work obtained including CBC with diff, CMP, and TSH.  We will call you abnormal results.  Otherwise follow up with your PCP for further evaluation and management of your symptoms.   °Return or go to ER if you have any new or worsening symptoms such as fever, chills, worsening fatigue, lightheadedness, dizziness, shortness of breath, wheezing, chest pain, nausea, vomiting, abdominal pain, changes in bowel or bladder habits, blood in stool, dark tarry stools, etc...  °

## 2020-04-24 LAB — COMPREHENSIVE METABOLIC PANEL
ALT: 13 IU/L (ref 0–32)
AST: 13 IU/L (ref 0–40)
Albumin/Globulin Ratio: 2.1 (ref 1.2–2.2)
Albumin: 4.2 g/dL (ref 3.9–5.0)
Alkaline Phosphatase: 89 IU/L (ref 48–121)
BUN/Creatinine Ratio: 10 (ref 9–23)
BUN: 7 mg/dL (ref 6–20)
Bilirubin Total: <0.2 mg/dL (ref 0.0–1.2)
CO2: 20 mmol/L (ref 20–29)
Calcium: 9.2 mg/dL (ref 8.7–10.2)
Chloride: 105 mmol/L (ref 96–106)
Creatinine, Ser: 0.71 mg/dL (ref 0.57–1.00)
GFR calc Af Amer: 133 mL/min/{1.73_m2} (ref >59)
GFR calc non Af Amer: 115 mL/min/{1.73_m2} (ref >59)
Globulin, Total: 2 g/dL (ref 1.5–4.5)
Glucose: 120 mg/dL — ABNORMAL HIGH (ref 65–99)
Potassium: 3.7 mmol/L (ref 3.5–5.2)
Sodium: 139 mmol/L (ref 134–144)
Total Protein: 6.2 g/dL (ref 6.0–8.5)

## 2020-04-24 LAB — CBC WITH DIFFERENTIAL/PLATELET
Basophils Absolute: 0.1 10*3/uL (ref 0.0–0.2)
Basos: 1 %
EOS (ABSOLUTE): 0.3 10*3/uL (ref 0.0–0.4)
Eos: 3 %
Hematocrit: 39.4 % (ref 34.0–46.6)
Hemoglobin: 12.9 g/dL (ref 11.1–15.9)
Immature Grans (Abs): 0 10*3/uL (ref 0.0–0.1)
Immature Granulocytes: 0 %
Lymphocytes Absolute: 4.1 10*3/uL — ABNORMAL HIGH (ref 0.7–3.1)
Lymphs: 44 %
MCH: 28 pg (ref 26.6–33.0)
MCHC: 32.7 g/dL (ref 31.5–35.7)
MCV: 86 fL (ref 79–97)
Monocytes Absolute: 0.7 10*3/uL (ref 0.1–0.9)
Monocytes: 8 %
Neutrophils Absolute: 4.3 10*3/uL (ref 1.4–7.0)
Neutrophils: 44 %
Platelets: 397 10*3/uL (ref 150–450)
RBC: 4.6 x10E6/uL (ref 3.77–5.28)
RDW: 12.4 % (ref 11.7–15.4)
WBC: 9.5 10*3/uL (ref 3.4–10.8)

## 2020-04-24 LAB — TSH: TSH: 1.78 u[IU]/mL (ref 0.450–4.500)

## 2020-04-25 ENCOUNTER — Telehealth (INDEPENDENT_AMBULATORY_CARE_PROVIDER_SITE_OTHER): Payer: Medicaid Other | Admitting: Psychiatry

## 2020-04-25 ENCOUNTER — Encounter (HOSPITAL_COMMUNITY): Payer: Self-pay | Admitting: Psychiatry

## 2020-04-25 ENCOUNTER — Other Ambulatory Visit: Payer: Self-pay

## 2020-04-25 DIAGNOSIS — I110 Hypertensive heart disease with heart failure: Secondary | ICD-10-CM | POA: Diagnosis not present

## 2020-04-25 DIAGNOSIS — E7849 Other hyperlipidemia: Secondary | ICD-10-CM | POA: Diagnosis not present

## 2020-04-25 DIAGNOSIS — F321 Major depressive disorder, single episode, moderate: Secondary | ICD-10-CM | POA: Diagnosis not present

## 2020-04-25 DIAGNOSIS — D559 Anemia due to enzyme disorder, unspecified: Secondary | ICD-10-CM | POA: Diagnosis not present

## 2020-04-25 DIAGNOSIS — D510 Vitamin B12 deficiency anemia due to intrinsic factor deficiency: Secondary | ICD-10-CM | POA: Diagnosis not present

## 2020-04-25 DIAGNOSIS — F4310 Post-traumatic stress disorder, unspecified: Secondary | ICD-10-CM | POA: Diagnosis not present

## 2020-04-25 DIAGNOSIS — G8929 Other chronic pain: Secondary | ICD-10-CM | POA: Diagnosis not present

## 2020-04-25 DIAGNOSIS — R5383 Other fatigue: Secondary | ICD-10-CM | POA: Diagnosis not present

## 2020-04-25 DIAGNOSIS — R5382 Chronic fatigue, unspecified: Secondary | ICD-10-CM | POA: Diagnosis not present

## 2020-04-25 MED ORDER — VORTIOXETINE HBR 20 MG PO TABS: 20.0000 mg | ORAL_TABLET | Freq: Every day | ORAL | 2 refills | Status: DC

## 2020-04-25 MED ORDER — ALPRAZOLAM 1 MG PO TABS: 1.0000 mg | ORAL_TABLET | Freq: Every day | ORAL | 2 refills | Status: DC | PRN
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
## 2020-06-04 DIAGNOSIS — N390 Urinary tract infection, site not specified: Secondary | ICD-10-CM | POA: Diagnosis not present

## 2020-06-04 DIAGNOSIS — R8271 Bacteriuria: Secondary | ICD-10-CM | POA: Diagnosis not present

## 2020-06-09 IMAGING — DX DG CHEST 2V
2 series · 2 of 2 positions shown · non-contrast
Comparison: 06/23/2017 chest radiograph.

CLINICAL DATA: Sepsis, cough

EXAM:
CHEST - 2 VIEW

[chest pa]
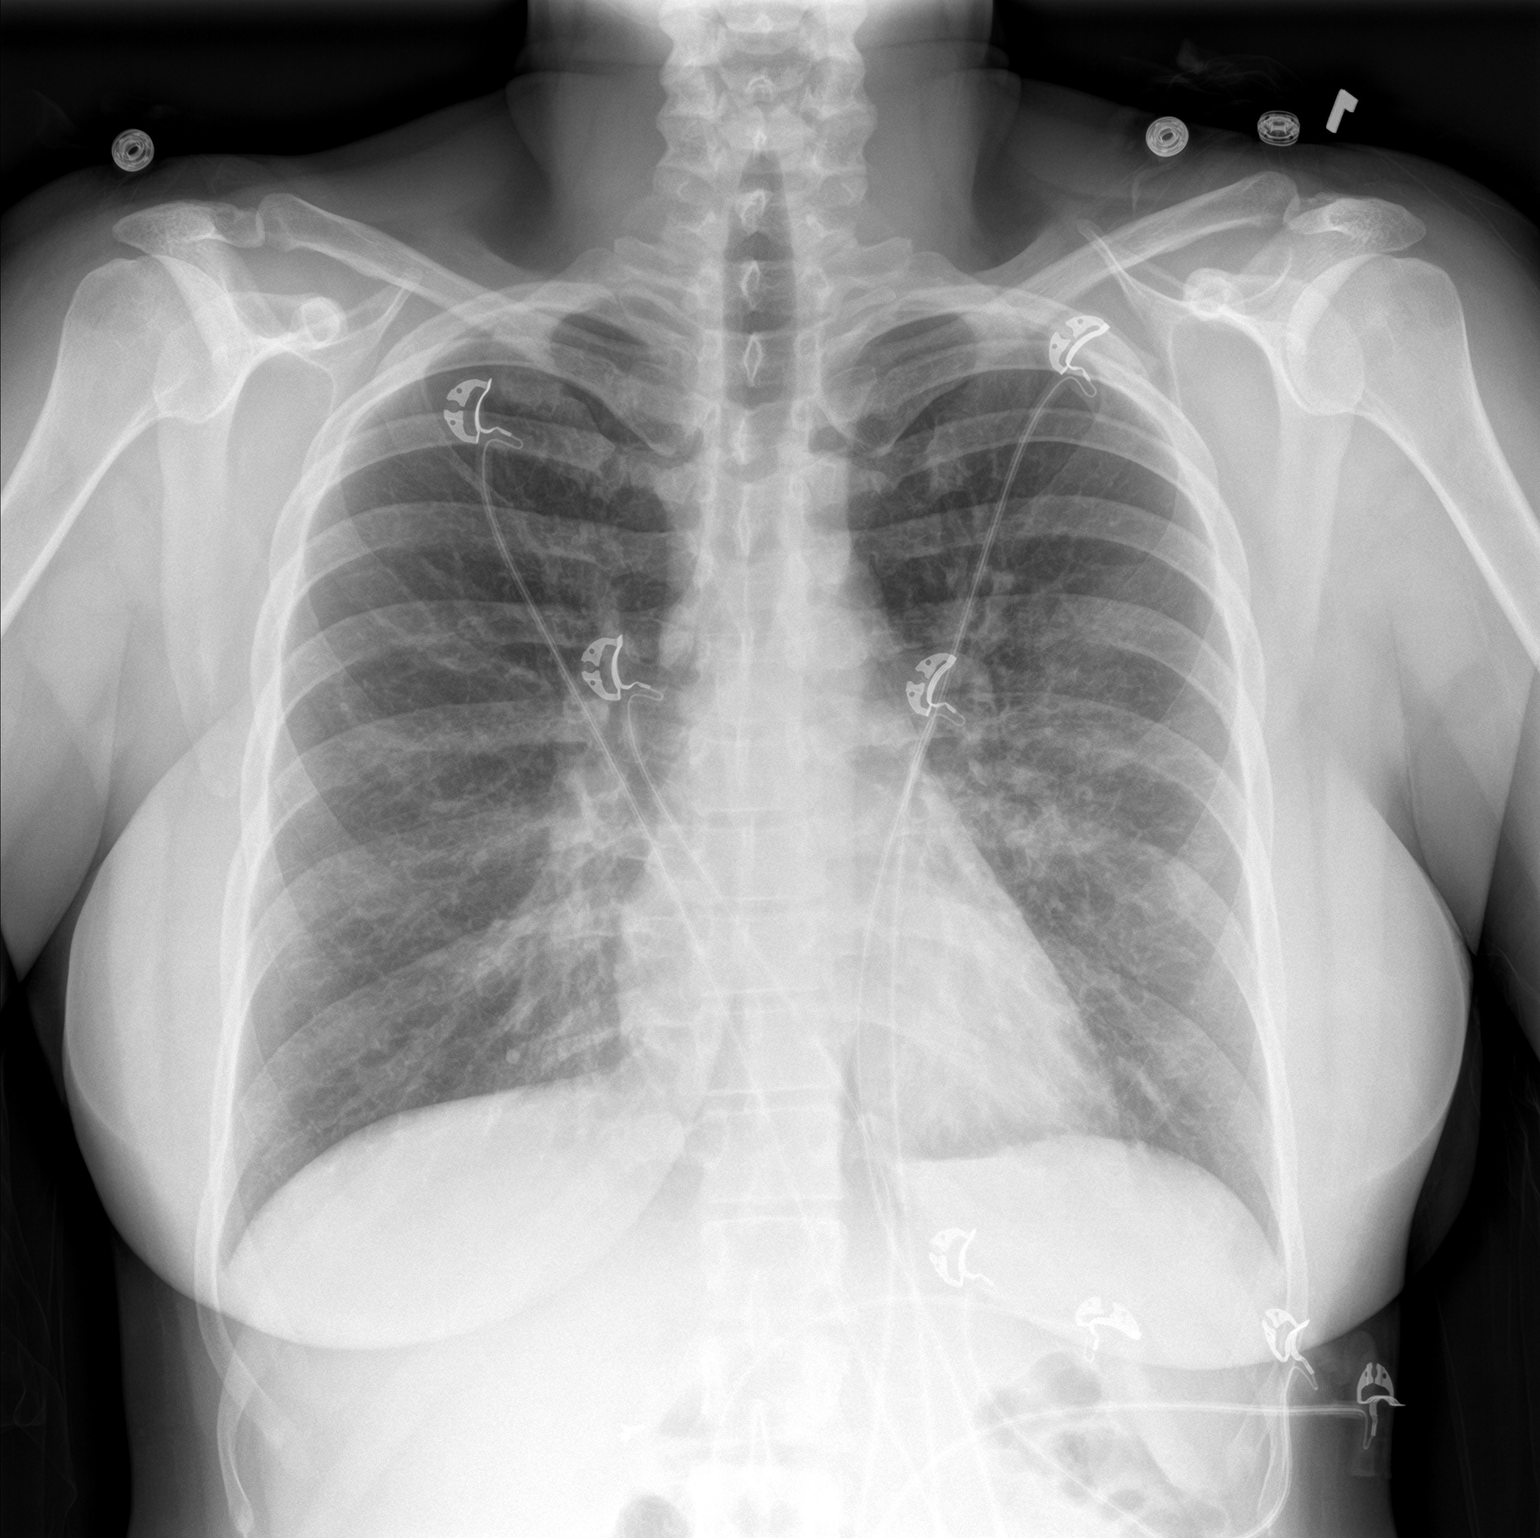

[chest lat]
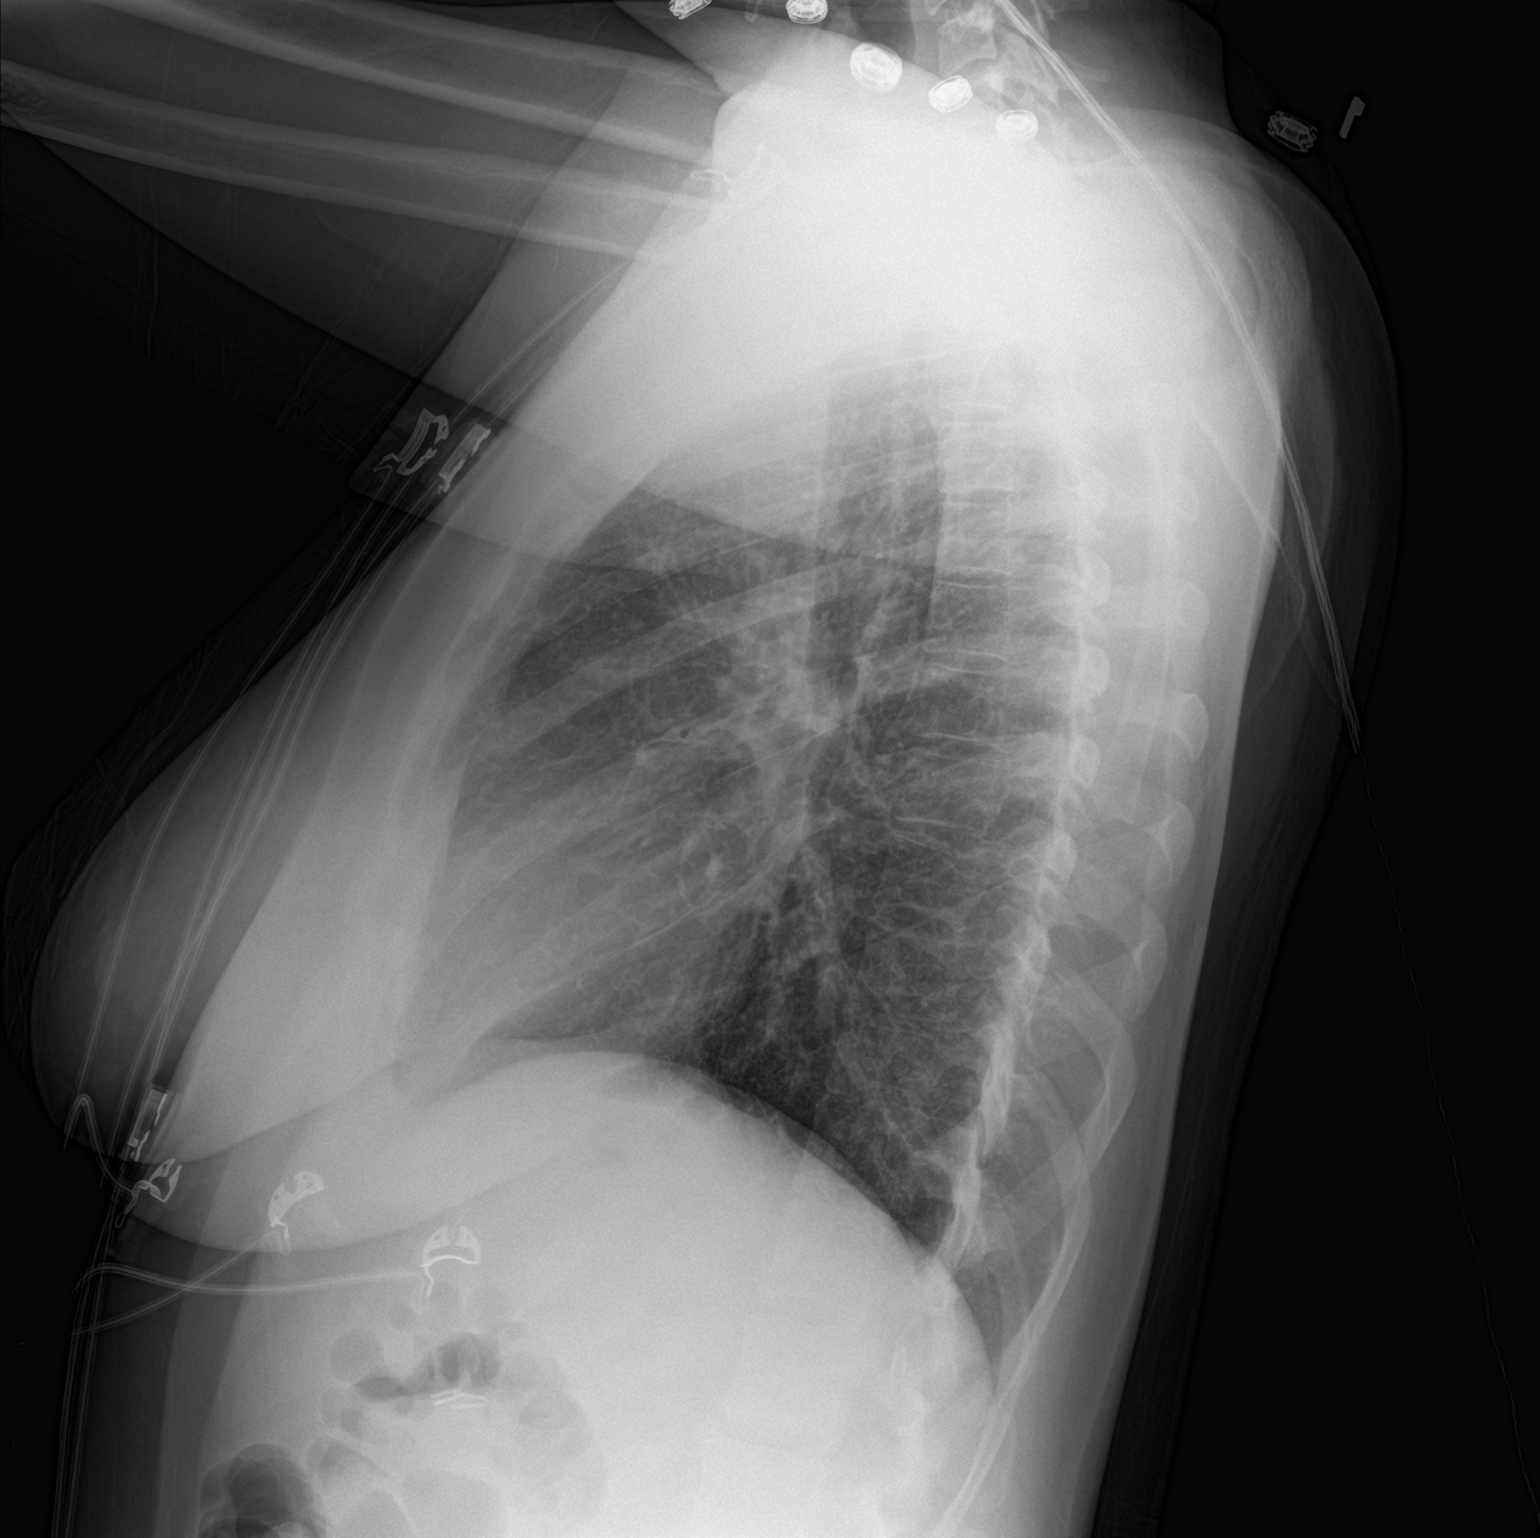

[2 of 2 positions shown; findings below may reference images not displayed]

FINDINGS: Stable cardiomediastinal silhouette with normal heart size. No
pneumothorax. No pleural effusion. Mild patchy hazy parahilar
opacities bilaterally.
IMPRESSION: Mild patchy hazy bilateral parahilar lung opacities, suspect
bronchopneumonia or atypical pneumonia.

## 2020-06-16 DIAGNOSIS — F418 Other specified anxiety disorders: Secondary | ICD-10-CM | POA: Diagnosis not present

## 2020-06-17 ENCOUNTER — Emergency Department (HOSPITAL_COMMUNITY)
Admission: EM | Admit: 2020-06-17 | Discharge: 2020-06-18 | Disposition: A | Discharge status: Home / Self Care | Payer: Medicaid Other | Attending: Emergency Medicine | Admitting: Emergency Medicine

## 2020-06-17 ENCOUNTER — Other Ambulatory Visit: Payer: Self-pay

## 2020-06-17 ENCOUNTER — Emergency Department (HOSPITAL_COMMUNITY): Payer: Medicaid Other

## 2020-06-17 ENCOUNTER — Encounter (HOSPITAL_COMMUNITY): Payer: Self-pay | Admitting: *Deleted

## 2020-06-17 ENCOUNTER — Ambulatory Visit
Admission: EM | Admit: 2020-06-17 | Discharge: 2020-06-17 | Disposition: A | Discharge status: Home / Self Care | Payer: Medicaid Other | Attending: Emergency Medicine | Admitting: Emergency Medicine

## 2020-06-17 DIAGNOSIS — Z91040 Latex allergy status: Secondary | ICD-10-CM | POA: Insufficient documentation

## 2020-06-17 DIAGNOSIS — J069 Acute upper respiratory infection, unspecified: Secondary | ICD-10-CM

## 2020-06-17 DIAGNOSIS — R0781 Pleurodynia: Secondary | ICD-10-CM | POA: Diagnosis not present

## 2020-06-17 DIAGNOSIS — R091 Pleurisy: Secondary | ICD-10-CM | POA: Diagnosis not present

## 2020-06-17 DIAGNOSIS — F17210 Nicotine dependence, cigarettes, uncomplicated: Secondary | ICD-10-CM | POA: Diagnosis not present

## 2020-06-17 DIAGNOSIS — Z20822 Contact with and (suspected) exposure to covid-19: Secondary | ICD-10-CM

## 2020-06-17 DIAGNOSIS — Z1152 Encounter for screening for COVID-19: Secondary | ICD-10-CM

## 2020-06-17 DIAGNOSIS — J45909 Unspecified asthma, uncomplicated: Secondary | ICD-10-CM | POA: Diagnosis not present

## 2020-06-17 DIAGNOSIS — R079 Chest pain, unspecified: Secondary | ICD-10-CM

## 2020-06-17 DIAGNOSIS — R0602 Shortness of breath: Secondary | ICD-10-CM | POA: Diagnosis not present

## 2020-06-17 LAB — CBC
HCT: 41.2 % (ref 36.0–46.0)
Hemoglobin: 13.7 g/dL (ref 12.0–15.0)
MCH: 29.7 pg (ref 26.0–34.0)
MCHC: 33.3 g/dL (ref 30.0–36.0)
MCV: 89.2 fL (ref 80.0–100.0)
Platelets: 378 10*3/uL (ref 150–400)
RBC: 4.62 MIL/uL (ref 3.87–5.11)
RDW: 12.9 % (ref 11.5–15.5)
WBC: 9.4 10*3/uL (ref 4.0–10.5)
nRBC: 0 % (ref 0.0–0.2)

## 2020-06-17 LAB — TROPONIN I (HIGH SENSITIVITY)
Troponin I (High Sensitivity): <2 ng/L (ref <18)
Troponin I (High Sensitivity): <2 ng/L (ref <18)

## 2020-06-17 LAB — BASIC METABOLIC PANEL
Anion gap: 8 (ref 5–15)
BUN: 6 mg/dL (ref 6–20)
CO2: 26 mmol/L (ref 22–32)
Calcium: 9.3 mg/dL (ref 8.9–10.3)
Chloride: 103 mmol/L (ref 98–111)
Creatinine, Ser: 0.67 mg/dL (ref 0.44–1.00)
GFR calc Af Amer: >60 mL/min (ref >60)
GFR calc non Af Amer: >60 mL/min (ref >60)
Glucose, Bld: 127 mg/dL — ABNORMAL HIGH (ref 70–99)
Potassium: 3.7 mmol/L (ref 3.5–5.1)
Sodium: 137 mmol/L (ref 135–145)

## 2020-06-17 MED ORDER — BENZONATATE 100 MG PO CAPS: 100.0000 mg | ORAL_CAPSULE | Freq: Three times a day (TID) | ORAL | 0 refills | Status: DC

## 2020-06-17 MED ORDER — PREDNISONE 20 MG PO TABS: 20.0000 mg | ORAL_TABLET | Freq: Two times a day (BID) | ORAL | 0 refills | Status: DC

## 2020-06-17 NOTE — ED Triage Notes (Signed)
Pt presents with c/o back and chest pain when taking a deep breath that began last night , has had some cough

## 2020-06-17 NOTE — ED Triage Notes (Signed)
Pt c/o back pain near shoulder blade and chest pain with deep breathing. Pt reports bilateral rib pain that started 1 week ago, but has now resolved. Intermittent cough. Pt reports SOB even at rest. Pt was seen at Urgent Care today and was told her oxygen saturation was 90% and to go to the ED. Pt's O2 sat here in triage is 99% on RA.

## 2020-06-17 NOTE — Discharge Instructions (Signed)
Unable to rule out cardiac disease or blood clot in urgent care setting.  Offered patient further evaluation and management in the ED.  Patient declines at this time and would like to try outpatient therapy first.  Aware of the risk associated with this decision including missed diagnosis, organ damage, organ failure, and/or death.  Patient aware and in agreement.     COVID testing ordered.  It will take between 5-7 days for test results.  Someone will contact you regarding abnormal results.    In the meantime: You should remain isolated in your home for 10 days from symptom onset AND greater than 72 hours after symptoms resolution (absence of fever without the use of fever-reducing medication and improvement in respiratory symptoms), whichever is longer Get plenty of rest and push fluids Tessalon Perles prescribed for cough Use OTC zyrtec for nasal congestion, runny nose, and/or sore throat Use OTC flonase for nasal congestion and runny nose Use medications daily for symptom relief Use OTC medications like ibuprofen or tylenol as needed fever or pain Call or go to the ED if you have any new or worsening symptoms such as fever, worsening cough, shortness of breath, chest tightness, chest pain, turning blue, changes in mental status, etc...  

## 2020-06-17 NOTE — ED Provider Notes (Signed)
[facility]   [id number] 06/17/20 Arrival Time: 1304   CC: COVID symptoms  SUBJECTIVE: History from: patient.  Karen Shields is a 29 y.o. female who presents with cough x 1 day.  Also mentions shoulder blade and rib pain that began sometime last week.  Denies sick exposure to COVID, flu or strep.  Denies trauma or injury.  Denies alleviating factors.  Symptoms are made worse with deep breath.  Denies fever, chills, fatigue, sinus pain, rhinorrhea, sore throat, SOB, wheezing, chest pain, nausea, changes in bowel or bladder habits.     ROS: As per HPI.  All other pertinent ROS negative.     Past Medical History:  Diagnosis Date  . Anxiety   . Asthma    WELL CONTROLLED  . Asthma   . Depression    pt stated  . Eczema 2008  . Gallstones   . GERD (gastroesophageal reflux disease)   . Headache   . Hx MRSA infection 09-2014  . IBS (irritable bowel syndrome)   . Ovarian cyst    Past Surgical History:  Procedure Laterality Date  . CHOLECYSTECTOMY N/A 01/16/2016   Procedure: LAPAROSCOPIC CHOLECYSTECTOMY;  Surgeon: Lattie Haw, MD;  Location: [facility] ORS;  Service: General;  Laterality: N/A;  . ESOPHAGOGASTRODUODENOSCOPY N/A 11/13/2015   [id number] reflux   . OVARIAN CYST REMOVAL    . OVARIAN CYST REMOVAL  2011  . TUBAL LIGATION    . WISDOM TOOTH EXTRACTION     Allergies  Allergen Reactions  . Latex Dermatitis   No current facility-administered medications on file prior to encounter.   Current Outpatient Medications on File Prior to Encounter  Medication Sig Dispense Refill  . albuterol (PROVENTIL HFA;VENTOLIN HFA) 108 (90 BASE) MCG/ACT inhaler Inhale 2 puffs into the lungs every 6 (six) hours as needed for wheezing. 18 g 5  . ALPRAZolam (XANAX) 1 MG tablet Take 1 tablet (1 mg total) by mouth daily as needed for anxiety. 30 tablet 2  . Cholecalciferol (VITAMIN D3) 5000 units CAPS Take by mouth.    . diclofenac (VOLTAREN) 75 MG EC tablet Take 1 tablet (75 mg  total) by mouth 2 (two) times daily. 12 tablet 0  . dicyclomine (BENTYL) 20 MG tablet Take 1 tablet (20 mg total) by mouth 2 (two) times daily. 20 tablet 0  . ibuprofen (ADVIL) 800 MG tablet Take 1 tablet (800 mg total) by mouth every 6 (six) hours as needed for moderate pain. 20 tablet 0  . loratadine-pseudoephedrine (CLARITIN-D 12 HOUR) 5-120 MG tablet Take 1 tablet by mouth 2 (two) times daily. 20 tablet 0  . nicotine polacrilex (NICORETTE) 2 MG gum Take 1 each (2 mg total) by mouth as needed for smoking cessation. (Patient not taking: Reported on 01/10/2020) 100 tablet 0  . Omeprazole (PRILOSEC PO) Take by mouth.    . vortioxetine HBr (TRINTELLIX) 20 MG TABS tablet Take 1 tablet (20 mg total) by mouth daily. 30 tablet 2   Social History   Socioeconomic History  . Marital status: Legally Separated    Spouse name: Not on file  . Number of children: 2  . Years of education: Not on file  . Highest education level: Not on file  Occupational History  . Not on file  Tobacco Use  . Smoking status: Current Every Day Smoker    Packs/day: 0.50    Years: 5.00    Pack years: 2.50    Types: Cigarettes    Start date: 02/11/2006  .  Smokeless tobacco: Never Used  . Tobacco comment: Patient is already making efforts, already has access to [employer] , using gum and chantix  Vaping Use  . Vaping Use: Never used  Substance and Sexual Activity  . Alcohol use: Yes    Alcohol/week: 0.0 standard drinks    Comment: occassionally, last used in March 2019  . Drug use: Not Currently    Types: Benzodiazepines, Opium  . Sexual activity: Yes    Partners: Male    Birth control/protection: Surgical  Other Topics Concern  . Not on file  Social History Narrative   ** Merged History Encounter **    Four daughters between herself and husband, two Human resources officer.    Social Determinants of Health   Financial Resource Strain:   . Difficulty of Paying Living Expenses: Not on file  Food Insecurity:   .  Worried About Programme researcher, broadcasting/film/video in the Last Year: Not on file  . Ran Out of Food in the Last Year: Not on file  Transportation Needs:   . Lack of Transportation (Medical): Not on file  . Lack of Transportation (Non-Medical): Not on file  Physical Activity:   . Days of Exercise per Week: Not on file  . Minutes of Exercise per Session: Not on file  Stress:   . Feeling of Stress : Not on file  Social Connections:   . Frequency of Communication with Friends and Family: Not on file  . Frequency of Social Gatherings with Friends and Family: Not on file  . Attends Religious Services: Not on file  . Active Member of Clubs or Organizations: Not on file  . Attends Banker Meetings: Not on file  . Marital Status: Not on file  Intimate Partner Violence:   . Fear of Current or Ex-Partner: Not on file  . Emotionally Abused: Not on file  . Physically Abused: Not on file  . Sexually Abused: Not on file   Family History  Problem Relation Age of Onset  . Diabetes Mother   . Anxiety disorder Mother   . Depression Mother   . Depression Maternal Grandmother   . Colon cancer Neg Hx   . Crohn's disease Neg Hx   . Celiac disease Neg Hx     OBJECTIVE:  Vitals:   06/17/20 1324  BP: 125/75  Pulse: 92  Resp: 20  Temp: 98.1 F (36.7 C)  SpO2: 91%     General appearance: alert; well-appearing, nontoxic; speaking in full sentences and tolerating own secretions HEENT: NCAT; Ears: EACs clear, TMs pearly gray; Eyes: PERRL.  EOM grossly intact.Nose: nares patent without rhinorrhea, Throat: oropharynx clear, tonsils non erythematous or enlarged, uvula midline  Neck: supple without LAD Lungs: unlabored respirations, symmetrical air entry; cough: absent; no respiratory distress; CTAB Heart: regular rate and rhythm.  Skin: warm and dry Psychological: alert and cooperative; normal mood and affect   ASSESSMENT & PLAN:  1. Encounter for screening for COVID-19   2. Viral URI with cough     3. Suspected COVID-19 virus infection     Meds ordered this encounter  Medications  . benzonatate (TESSALON) 100 MG capsule    Sig: Take 1 capsule (100 mg total) by mouth every 8 (eight) hours.    Dispense:  21 capsule    Refill:  0    Order Specific Question:   Supervising Provider    Answer:   Eustace Moore [[id number]]  . predniSONE (DELTASONE) 20 MG tablet  Sig: Take 1 tablet (20 mg total) by mouth 2 (two) times daily with a meal for 5 days.    Dispense:  10 tablet    Refill:  0    Order Specific Question:   Supervising Provider    Answer:   Eustace Moore [[id number]]   Unable to rule out cardiac disease or blood clot in urgent care setting.  Offered patient further evaluation and management in the ED.  Patient declines at this time and would like to try outpatient therapy first.  Aware of the risk associated with this decision including missed diagnosis, organ damage, organ failure, and/or death.  Patient aware and in agreement.    COVID testing ordered.  It will take between 5-7 days for test results.  Someone will contact you regarding abnormal results.    In the meantime: You should remain isolated in your home for 10 days from symptom onset AND greater than 72 hours after symptoms resolution (absence of fever without the use of fever-reducing medication and improvement in respiratory symptoms), whichever is longer Get plenty of rest and push fluids Tessalon Perles prescribed for cough Use OTC zyrtec for nasal congestion, runny nose, and/or sore throat Use OTC flonase for nasal congestion and runny nose Use medications daily for symptom relief Use OTC medications like ibuprofen or tylenol as needed fever or pain Call or go to the ED if you have any new or worsening symptoms such as fever, worsening cough, shortness of breath, chest tightness, chest pain, turning blue, changes in mental status, etc...   Reviewed expectations re: course of current medical issues.  Questions answered. Outlined signs and symptoms indicating need for more acute intervention. Patient verbalized understanding. After Visit Summary given.         Rennis Harding, PA-C 06/17/20 1356

## 2020-06-18 LAB — D-DIMER, QUANTITATIVE: D-Dimer, Quant: 0.3 ug/mL-FEU (ref 0.00–0.50)

## 2020-06-18 LAB — SARS-COV-2, NAA 2 DAY TAT

## 2020-06-18 LAB — I-STAT BETA HCG BLOOD, ED (MC, WL, AP ONLY): I-stat hCG, quantitative: <5 m[IU]/mL (ref <5)

## 2020-06-18 LAB — NOVEL CORONAVIRUS, NAA: SARS-CoV-2, NAA: NOT DETECTED

## 2020-06-18 MED ORDER — NAPROXEN 250 MG PO TABS
500.0000 mg | ORAL_TABLET | Freq: Once | ORAL | Status: DC
  Filled 2020-06-18: qty 2

## 2020-06-18 MED ORDER — ACETAMINOPHEN 325 MG PO TABS
650.0000 mg | ORAL_TABLET | Freq: Once | ORAL | Status: AC
  Administered 2020-06-18: 650 mg via ORAL
  Filled 2020-06-18: qty 2

## 2020-06-18 MED ORDER — ALBUTEROL SULFATE HFA 108 (90 BASE) MCG/ACT IN AERS
4.0000 | INHALATION_SPRAY | Freq: Once | RESPIRATORY_TRACT | Status: AC
  Administered 2020-06-18: 4 via RESPIRATORY_TRACT
  Filled 2020-06-18: qty 6.7

## 2020-06-18 MED ORDER — NAPROXEN 375 MG PO TABS: 375.0000 mg | ORAL_TABLET | Freq: Two times a day (BID) | ORAL | 0 refills | Status: DC

## 2020-06-18 MED ORDER — ALBUTEROL SULFATE HFA 108 (90 BASE) MCG/ACT IN AERS
2.0000 | INHALATION_SPRAY | Freq: Four times a day (QID) | RESPIRATORY_TRACT | 5 refills | Status: DC | PRN
Start: 2020-06-18 — End: 2022-07-02

## 2020-06-18 NOTE — Discharge Instructions (Addendum)
We saw you in the ER for chest pain. All the results in the ER are normal, labs and imaging. We are not sure what is causing your symptoms. The workup in the ER is not complete, and is limited to screening for life threatening and emergent conditions only, so please see a primary care doctor for further evaluation.

## 2020-06-18 NOTE — ED Provider Notes (Signed)
[facility] EMERGENCY DEPARTMENT Provider Note   CSN: [id number] Arrival date & time: 06/17/20  1505     History Chief Complaint  Patient presents with  . Shortness of Breath  . Chest Pain    Karen Shields is a 28 y.o. female.  HPI     29 year old female comes in a chief complaint of chest pain or shortness of breath.  She has history of asthma.  She reports that she had just gone to urgent care earlier and has been tested for COVID-19, results are pending.  She comes into the ER because of pain between her shoulder blade.  Over the last few days she has had some pain on the right side of her ribs with cough.  Tonight she had pain between her shoulder blade with inspiration. Pt has no hx of PE, DVT and denies any exogenous hormone (testosterone / estrogen) use, long distance travels or surgery in the past 6 weeks, active cancer, recent immobilization. She denies any history of heart issues or premature CAD in the family. + non productive cough with mild wheezing.  Past Medical History:  Diagnosis Date  . Anxiety   . Asthma    WELL CONTROLLED  . Asthma   . Depression    pt stated  . Eczema 2008  . Gallstones   . GERD (gastroesophageal reflux disease)   . Headache   . Hx MRSA infection 09-2014  . IBS (irritable bowel syndrome)   . Ovarian cyst     Patient Active Problem List   Diagnosis Date Noted  . MDD (major depressive disorder), recurrent episode, severe (HCC) 12/22/2017  . Gallstone   . RUQ pain 12/25/2015  . Gallstones   . Reflux esophagitis   . Nausea without vomiting 11/04/2015  . Cholelithiases 11/04/2015  . GERD (gastroesophageal reflux disease) 11/04/2015  . Early satiety 11/04/2015  . MRSA (methicillin resistant staph aureus) culture positive 12/11/2014  . Menstrual migraine without status migrainosus, not intractable 07/10/2014  . Anxiety 06/14/2013  . Maternal substance abuse 04/14/2013  . H/O cold sores 04/12/2013  . Ovarian cyst, left  02/09/2011    Past Surgical History:  Procedure Laterality Date  . CHOLECYSTECTOMY N/A 01/16/2016   Procedure: LAPAROSCOPIC CHOLECYSTECTOMY;  Surgeon: Lattie Haw, MD;  Location: [facility] ORS;  Service: General;  Laterality: N/A;  . ESOPHAGOGASTRODUODENOSCOPY N/A 11/13/2015   WCB:JSEGBTD reflux   . OVARIAN CYST REMOVAL    . OVARIAN CYST REMOVAL  2011  . TUBAL LIGATION    . WISDOM TOOTH EXTRACTION       OB History    Gravida  2   Para  2   Term  1   Preterm  0   AB  0   Living  2     SAB  0   TAB  0   Ectopic  0   Multiple  0   Live Births  1           Family History  Problem Relation Age of Onset  . Diabetes Mother   . Anxiety disorder Mother   . Depression Mother   . Depression Maternal Grandmother   . Colon cancer Neg Hx   . Crohn's disease Neg Hx   . Celiac disease Neg Hx     Social History   Tobacco Use  . Smoking status: Current Every Day Smoker    Packs/day: 0.75    Years: 5.00    Pack years: 3.75    Types: Cigarettes  Start date: 02/11/2006  . Smokeless tobacco: Never Used  Vaping Use  . Vaping Use: Former  Substance Use Topics  . Alcohol use: Yes    Alcohol/week: 0.0 standard drinks    Comment: rarely, maybe once a year  . Drug use: Not Currently    Types: Benzodiazepines, Opium    Home Medications Prior to Admission medications   Medication Sig Start Date End Date Taking? Authorizing Provider  albuterol (VENTOLIN HFA) 108 (90 Base) MCG/ACT inhaler Inhale 2 puffs into the lungs every 6 (six) hours as needed for wheezing. 06/18/20   Derwood Kaplan, MD  ALPRAZolam Prudy Feeler) 1 MG tablet Take 1 tablet (1 mg total) by mouth daily as needed for anxiety. 04/25/20   Myrlene Broker, MD  benzonatate (TESSALON) 100 MG capsule Take 1 capsule (100 mg total) by mouth every 8 (eight) hours. 06/17/20   Wurst, [place], PA-C  Cholecalciferol (VITAMIN D3) 5000 units CAPS Take by mouth.    [provider]  diclofenac (VOLTAREN) 75 MG EC  tablet Take 1 tablet (75 mg total) by mouth 2 (two) times daily. 11/29/18   Ivery Quale, PA-C  dicyclomine (BENTYL) 20 MG tablet Take 1 tablet (20 mg total) by mouth 2 (two) times daily. 03/04/19   Triplett, Tammy, PA-C  ibuprofen (ADVIL) 800 MG tablet Take 1 tablet (800 mg total) by mouth every 6 (six) hours as needed for moderate pain. 06/14/19   Gilda Crease, MD  loratadine-pseudoephedrine (CLARITIN-D 12 HOUR) 5-120 MG tablet Take 1 tablet by mouth 2 (two) times daily. 10/09/18   Ivery Quale, PA-C  naproxen (NAPROSYN) 375 MG tablet Take 1 tablet (375 mg total) by mouth 2 (two) times daily. 06/18/20   Derwood Kaplan, MD  nicotine polacrilex (NICORETTE) 2 MG gum Take 1 each (2 mg total) by mouth as needed for smoking cessation. Patient not taking: Reported on 01/10/2020 12/26/17   Oneta Rack, NP  Omeprazole (PRILOSEC PO) Take by mouth.    [provider]  vortioxetine HBr (TRINTELLIX) 20 MG TABS tablet Take 1 tablet (20 mg total) by mouth daily. 04/25/20   Myrlene Broker, MD    Allergies    Latex  Review of Systems   Review of Systems  Constitutional: Positive for activity change.  Respiratory: Positive for cough.   Cardiovascular: Positive for chest pain.  Neurological: Negative for dizziness.  Hematological: Does not bruise/bleed easily.  All other systems reviewed and are negative.   Physical Exam Updated Vital Signs BP 122/63   Pulse 64   Temp 98.7 F (37.1 C) (Oral)   Resp 19   Ht 5\' 4"  (1.626 m)   Wt 72.6 kg   LMP 05/17/2020   SpO2 100%   BMI 27.46 kg/m   Physical Exam Vitals and nursing note reviewed.  Constitutional:      Appearance: She is well-developed.  HENT:     Head: Normocephalic and atraumatic.  Cardiovascular:     Rate and Rhythm: Normal rate.  Pulmonary:     Effort: Pulmonary effort is normal.     Breath sounds: No decreased breath sounds, wheezing, rhonchi or rales.  Abdominal:     General: Bowel sounds are normal.    Musculoskeletal:     Cervical back: Normal range of motion and neck supple.     Right lower leg: No edema.     Left lower leg: No edema.  Skin:    General: Skin is warm and dry.  Neurological:     Mental  Status: She is alert and oriented to person, place, and time.     ED Results / Procedures / Treatments   Labs (all labs ordered are listed, but only abnormal results are displayed) Labs Reviewed  BASIC METABOLIC PANEL - Abnormal; Notable for the following components:      Result Value   Glucose, Bld 127 (*)    All other components within normal limits  CBC  D-DIMER, QUANTITATIVE (NOT AT [facility])  I-STAT BETA HCG BLOOD, ED ([facility], [facility], [facility] ONLY)  TROPONIN I (HIGH SENSITIVITY)  TROPONIN I (HIGH SENSITIVITY)    EKG EKG Interpretation  Date/Time:  Tuesday June 17 2020 15:10:26 EDT Ventricular Rate:  105 PR Interval:  132 QRS Duration: 80 QT Interval:  330 QTC Calculation: 436 R Axis:   83 Text Interpretation: Sinus tachycardia Otherwise normal ECG No acute changes No significant change since last tracing Confirmed by Derwood Kaplananavati, [place] Acklin ([id number]) on 06/18/2020 2:39:24 AM   Radiology DG Chest Port 1 View  Result Date: 06/17/2020 CLINICAL DATA:  Back pain and chest pain. EXAM: PORTABLE CHEST 1 VIEW COMPARISON:  February 20, 2019 FINDINGS: There is no evidence of acute infiltrate, pleural effusion or pneumothorax. The heart size and mediastinal contours are within normal limits. The visualized skeletal structures are unremarkable. IMPRESSION: No active disease. Electronically Signed   By: Aram Candelahaddeus  Houston M.D.   On: 06/17/2020 16:36    Procedures Procedures (including critical care time)  Medications Ordered in ED Medications  albuterol (VENTOLIN HFA) 108 (90 Base) MCG/ACT inhaler 4 puff (4 puffs Inhalation Given 06/18/20 0256)  acetaminophen (TYLENOL) tablet 650 mg (650 mg Oral Given 06/18/20 0256)    ED Course  I have reviewed the triage vital signs and the nursing  notes.  Pertinent labs & imaging results that were available during my care of the patient were reviewed by me and considered in my medical decision making (see chart for details).    MDM Rules/Calculators/A&P                          29 year old female comes in a chief complaint of chest pain that is pleuritic.  She also has cough and wheezing.  She was seen in the urgent care earlier and discharged with prednisone and inhalers.  Work-up in the ER is reassuring including delta troponin.  D-dimer ordered and it is also negative.  For now we will treat this as pleurisy.  Questionable bronchitis or COVID-19.  No COVID-19 test sent as it was already sent from the urgent care.  Titus DubinKendall B Riddle was evaluated in Emergency Department on 06/18/2020 for the symptoms described in the history of present illness. She was evaluated in the context of the global COVID-19 pandemic, which necessitated consideration that the patient might be at risk for infection with the SARS-CoV-2 virus that causes COVID-19. Institutional protocols and algorithms that pertain to the evaluation of patients at risk for COVID-19 are in a state of rapid change based on information released by regulatory bodies including the CDC and federal and state organizations. These policies and algorithms were followed during the patient's care in the ED.   Final Clinical Impression(s) / ED Diagnoses Final diagnoses:  Pleurisy    Rx / DC Orders ED Discharge Orders         Ordered    albuterol (VENTOLIN HFA) 108 (90 Base) MCG/ACT inhaler  Every 6 hours PRN        06/18/20 0435  naproxen (NAPROSYN) 375 MG tablet  2 times daily        06/18/20 0435           Derwood Kaplan, MD 06/18/20 [phone]

## 2020-06-18 NOTE — ED Notes (Signed)
Updated pt on wait.  Gave pt warm blanket and turned of lights

## 2020-06-19 ENCOUNTER — Telehealth: Payer: Self-pay

## 2020-06-19 NOTE — Telephone Encounter (Signed)
Transition Care Management Unsuccessful Follow-up Telephone Call  Date of discharge and from where:  06/18/2020 from [facility]  Attempts:  1st Attempt  Reason for unsuccessful TCM follow-up call:  Left voice message

## 2020-06-20 ENCOUNTER — Telehealth (HOSPITAL_COMMUNITY): Payer: Self-pay | Admitting: Psychiatry

## 2020-06-20 NOTE — Telephone Encounter (Signed)
Transition Care Management Follow-up Telephone Call  Date of discharge and from where: 06/18/2020 from [facility]  How have you been since you were released from the hospital? Patient   Any questions or concerns? No  Items Reviewed:  Did the pt receive and understand the discharge instructions provided? Yes   Medications obtained and verified? Yes   Any new allergies since your discharge? No   Dietary orders reviewed? Yes  Do you have support at home? Yes   Functional Questionnaire: (I = Independent and D = Dependent) ADLs: I Bathing/Dressing- I Meal Prep- I Eating- I Maintaining continence- I Transferring/Ambulation- I Managing Meds- I  Follow up appointments reviewed:   PCP Hospital f/u appt confirmed? Yes, PCP is not in our system. Was not able to verify.   Are transportation arrangements needed? No   If their condition worsens, is the pt aware to call PCP or go to the Emergency Dept.? Yes  Was the patient provided with contact information for the PCP's office or ED? Yes  Was to pt encouraged to call back with questions or concerns? Yes

## 2020-06-20 NOTE — Telephone Encounter (Signed)
Called to schedule f/u appt, lvm 

## 2020-06-24 ENCOUNTER — Telehealth (HOSPITAL_COMMUNITY): Payer: Self-pay | Admitting: Psychiatry

## 2020-06-24 NOTE — Telephone Encounter (Signed)
Called to schedule f/u, lvm 

## 2020-07-10 ENCOUNTER — Telehealth (HOSPITAL_COMMUNITY): Payer: Self-pay | Admitting: Psychiatry

## 2020-07-10 ENCOUNTER — Telehealth (HOSPITAL_COMMUNITY): Payer: Medicaid Other | Admitting: Psychiatry

## 2020-07-10 ENCOUNTER — Other Ambulatory Visit (HOSPITAL_COMMUNITY): Payer: Self-pay | Admitting: Psychiatry

## 2020-07-10 ENCOUNTER — Other Ambulatory Visit: Payer: Self-pay

## 2020-07-10 MED ORDER — VORTIOXETINE HBR 20 MG PO TABS
20.0000 mg | ORAL_TABLET | Freq: Every day | ORAL | 0 refills | Status: DC
Start: 2020-07-10 — End: 2020-07-18

## 2020-07-10 NOTE — Telephone Encounter (Signed)
Patient is asking for refill on Trintellix, pharmacy stated they sent in refill requestross

## 2020-07-10 NOTE — Telephone Encounter (Signed)
She missed her appt today again. This will be the last time if she misses again will be d/c. 30 day sent

## 2020-07-10 NOTE — Telephone Encounter (Signed)
Informed patient and she verbalized understanding.  

## 2020-07-15 ENCOUNTER — Ambulatory Visit (HOSPITAL_COMMUNITY): Payer: Medicaid Other | Admitting: Psychiatry

## 2020-07-18 ENCOUNTER — Telehealth (INDEPENDENT_AMBULATORY_CARE_PROVIDER_SITE_OTHER): Payer: Medicaid Other | Admitting: Psychiatry

## 2020-07-18 ENCOUNTER — Other Ambulatory Visit: Payer: Self-pay

## 2020-07-18 ENCOUNTER — Encounter (HOSPITAL_COMMUNITY): Payer: Self-pay | Admitting: Psychiatry

## 2020-07-18 DIAGNOSIS — F4310 Post-traumatic stress disorder, unspecified: Secondary | ICD-10-CM

## 2020-07-18 DIAGNOSIS — F321 Major depressive disorder, single episode, moderate: Secondary | ICD-10-CM

## 2020-07-18 MED ORDER — VORTIOXETINE HBR 20 MG PO TABS
20.0000 mg | ORAL_TABLET | Freq: Every day | ORAL | 0 refills | Status: DC
Start: 2020-07-18 — End: 2020-10-16

## 2020-07-18 MED ORDER — ALPRAZOLAM 1 MG PO TABS: 1.0000 mg | ORAL_TABLET | Freq: Every day | ORAL | 2 refills | Status: DC | PRN
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
## 2020-07-25 DIAGNOSIS — K219 Gastro-esophageal reflux disease without esophagitis: Secondary | ICD-10-CM | POA: Diagnosis not present

## 2020-07-25 DIAGNOSIS — E559 Vitamin D deficiency, unspecified: Secondary | ICD-10-CM | POA: Diagnosis not present

## 2020-07-28 ENCOUNTER — Other Ambulatory Visit: Payer: Self-pay

## 2020-07-28 ENCOUNTER — Emergency Department (HOSPITAL_COMMUNITY): Payer: Medicaid Other

## 2020-07-28 ENCOUNTER — Encounter: Payer: Self-pay | Admitting: Emergency Medicine

## 2020-07-28 ENCOUNTER — Encounter (HOSPITAL_COMMUNITY): Payer: Self-pay | Admitting: Emergency Medicine

## 2020-07-28 ENCOUNTER — Ambulatory Visit
Admission: EM | Admit: 2020-07-28 | Discharge: 2020-07-28 | Disposition: A | Discharge status: Home / Self Care | Payer: Medicaid Other | Attending: Emergency Medicine | Admitting: Emergency Medicine

## 2020-07-28 ENCOUNTER — Emergency Department (HOSPITAL_COMMUNITY)
Admission: EM | Admit: 2020-07-28 | Discharge: 2020-07-29 | Disposition: A | Discharge status: Home / Self Care | Payer: Medicaid Other | Attending: Emergency Medicine | Admitting: Emergency Medicine

## 2020-07-28 DIAGNOSIS — Z91040 Latex allergy status: Secondary | ICD-10-CM | POA: Insufficient documentation

## 2020-07-28 DIAGNOSIS — Z79899 Other long term (current) drug therapy: Secondary | ICD-10-CM | POA: Insufficient documentation

## 2020-07-28 DIAGNOSIS — K219 Gastro-esophageal reflux disease without esophagitis: Secondary | ICD-10-CM | POA: Diagnosis not present

## 2020-07-28 DIAGNOSIS — R059 Cough, unspecified: Secondary | ICD-10-CM

## 2020-07-28 DIAGNOSIS — R0789 Other chest pain: Secondary | ICD-10-CM

## 2020-07-28 DIAGNOSIS — R21 Rash and other nonspecific skin eruption: Secondary | ICD-10-CM | POA: Diagnosis not present

## 2020-07-28 DIAGNOSIS — F17210 Nicotine dependence, cigarettes, uncomplicated: Secondary | ICD-10-CM | POA: Insufficient documentation

## 2020-07-28 DIAGNOSIS — R079 Chest pain, unspecified: Secondary | ICD-10-CM | POA: Diagnosis not present

## 2020-07-28 LAB — CBC
HCT: 42 % (ref 36.0–46.0)
Hemoglobin: 14.1 g/dL (ref 12.0–15.0)
MCH: 29.8 pg (ref 26.0–34.0)
MCHC: 33.6 g/dL (ref 30.0–36.0)
MCV: 88.8 fL (ref 80.0–100.0)
Platelets: 407 10*3/uL — ABNORMAL HIGH (ref 150–400)
RBC: 4.73 MIL/uL (ref 3.87–5.11)
RDW: 12.5 % (ref 11.5–15.5)
WBC: 9.6 10*3/uL (ref 4.0–10.5)
nRBC: 0 % (ref 0.0–0.2)

## 2020-07-28 LAB — TROPONIN I (HIGH SENSITIVITY)
Troponin I (High Sensitivity): <2 ng/L (ref <18)
Troponin I (High Sensitivity): <2 ng/L (ref <18)

## 2020-07-28 LAB — BASIC METABOLIC PANEL
Anion gap: 8 (ref 5–15)
BUN: <5 mg/dL — ABNORMAL LOW (ref 6–20)
CO2: 24 mmol/L (ref 22–32)
Calcium: 9.5 mg/dL (ref 8.9–10.3)
Chloride: 103 mmol/L (ref 98–111)
Creatinine, Ser: 0.74 mg/dL (ref 0.44–1.00)
GFR, Estimated: >60 mL/min (ref >60)
Glucose, Bld: 101 mg/dL — ABNORMAL HIGH (ref 70–99)
Potassium: 3.3 mmol/L — ABNORMAL LOW (ref 3.5–5.1)
Sodium: 135 mmol/L (ref 135–145)

## 2020-07-28 LAB — I-STAT BETA HCG BLOOD, ED (MC, WL, AP ONLY): I-stat hCG, quantitative: <5 m[IU]/mL (ref <5)

## 2020-07-28 NOTE — ED Triage Notes (Signed)
Pt c/o left cp radiating to her shoulder for the past 2 days, with nausea.

## 2020-07-28 NOTE — Discharge Instructions (Signed)
Recommending further evaluation and management in the ED.  Cannot rule out cardiac cause or blood clot in urgent care setting.  Patient aware and in agreement with plan.  Will travel by private vehicle to ED.

## 2020-07-28 NOTE — ED Triage Notes (Addendum)
Pt has had cough for a couple of weeks now. Pcp gave pt levofloxacin on Friday.  Pt reports she has been struggling with depression lately and feels like she has been having some panic attacks.  States she has pain to chest, RT shoulder and abd when coughing.

## 2020-07-28 NOTE — ED Notes (Signed)
Patient is being discharged from the Urgent Care and sent to the Emergency Department via pov . Per B Wurst , patient is in need of higher level of care due to cp. Patient is aware and verbalizes understanding of plan of care.  Vitals:   07/28/20 1343  BP: 128/83  Pulse: (!) 108  Resp: 18  Temp: 98.1 F (36.7 C)  SpO2: 95%

## 2020-07-28 NOTE — ED Provider Notes (Signed)
[facility] CARE CENTER   [id number] 07/28/20 Arrival Time: 1333   CC: CHEST pressure  SUBJECTIVE:  Karen Shields is a 29 y.o. female who presents with complaint of chest pressure x 3 days ago.  Reports recent URI symptoms, prescribed levaquin for possible PNA by PCP.  Also reports depression and anxiety.  Localizes chest pain to the RT side.  Describes as stable, that is intermittent (with episodes last couple of minutes) and pressure in character.  Rates pain as 5/10.   Has tried xanax without relief.  Symptoms made worse with deep breath.  Reports radiating symptoms into RT shoulder blade.  Denies previous symptoms in the past.  Complains of productive cough x 2 weeks, numbness/ tingling to RLE x 1 week, abdominal cramping, subjective fever, chills, lightheadedness, dizziness, loss of appetite, nausea, SOB, decreased fluid intake, constipation, excessive armpit sweating, and anxiety.  Denies palpitations, tachycardia, vomiting,  peripheral edema.    Admits to tobacco use.    Denies calf pain or swelling, recent long travel, recent surgery, pregnancy, malignancy, hormone use, or previous blood clot  Father with 3 heart attacks.    ROS: As per HPI.  All other pertinent ROS negative.    Past Medical History:  Diagnosis Date  . Anxiety   . Asthma    WELL CONTROLLED  . Asthma   . Depression    pt stated  . Eczema 2008  . Gallstones   . GERD (gastroesophageal reflux disease)   . Headache   . Hx MRSA infection 09-2014  . IBS (irritable bowel syndrome)   . Ovarian cyst    Past Surgical History:  Procedure Laterality Date  . CHOLECYSTECTOMY N/A 01/16/2016   Procedure: LAPAROSCOPIC CHOLECYSTECTOMY;  Surgeon: Lattie Haw, MD;  Location: [facility] ORS;  Service: General;  Laterality: N/A;  . ESOPHAGOGASTRODUODENOSCOPY N/A 11/13/2015   ZJI:RCVELFY reflux   . OVARIAN CYST REMOVAL    . OVARIAN CYST REMOVAL  2011  . TUBAL LIGATION    . WISDOM TOOTH EXTRACTION     Allergies  Allergen  Reactions  . Latex Dermatitis   No current facility-administered medications on file prior to encounter.   Current Outpatient Medications on File Prior to Encounter  Medication Sig Dispense Refill  . albuterol (VENTOLIN HFA) 108 (90 Base) MCG/ACT inhaler Inhale 2 puffs into the lungs every 6 (six) hours as needed for wheezing. 18 g 5  . ALPRAZolam (XANAX) 1 MG tablet Take 1 tablet (1 mg total) by mouth daily as needed for anxiety. 30 tablet 2  . benzonatate (TESSALON) 100 MG capsule Take 1 capsule (100 mg total) by mouth every 8 (eight) hours. 21 capsule 0  . Cholecalciferol (VITAMIN D3) 5000 units CAPS Take by mouth.    . diclofenac (VOLTAREN) 75 MG EC tablet Take 1 tablet (75 mg total) by mouth 2 (two) times daily. 12 tablet 0  . dicyclomine (BENTYL) 20 MG tablet Take 1 tablet (20 mg total) by mouth 2 (two) times daily. 20 tablet 0  . ibuprofen (ADVIL) 800 MG tablet Take 1 tablet (800 mg total) by mouth every 6 (six) hours as needed for moderate pain. 20 tablet 0  . loratadine-pseudoephedrine (CLARITIN-D 12 HOUR) 5-120 MG tablet Take 1 tablet by mouth 2 (two) times daily. 20 tablet 0  . naproxen (NAPROSYN) 375 MG tablet Take 1 tablet (375 mg total) by mouth 2 (two) times daily. 20 tablet 0  . nicotine polacrilex (NICORETTE) 2 MG gum Take 1 each (2 mg total) by mouth  as needed for smoking cessation. (Patient not taking: Reported on 01/10/2020) 100 tablet 0  . Omeprazole (PRILOSEC PO) Take by mouth.    . vortioxetine HBr (TRINTELLIX) 20 MG TABS tablet Take 1 tablet (20 mg total) by mouth daily. 30 tablet 0   Social History   Socioeconomic History  . Marital status: Legally Separated    Spouse name: Not on file  . Number of children: 2  . Years of education: Not on file  . Highest education level: Not on file  Occupational History  . Not on file  Tobacco Use  . Smoking status: Current Every Day Smoker    Packs/day: 0.75    Years: 5.00    Pack years: 3.75    Types: Cigarettes     Start date: 02/11/2006  . Smokeless tobacco: Never Used  Vaping Use  . Vaping Use: Former  Substance and Sexual Activity  . Alcohol use: Yes    Alcohol/week: 0.0 standard drinks    Comment: rarely, maybe once a year  . Drug use: Not Currently    Types: Benzodiazepines, Opium  . Sexual activity: Yes    Partners: Male    Birth control/protection: Surgical  Other Topics Concern  . Not on file  Social History Narrative   ** Merged History Encounter **    Four daughters between herself and husband, two Human resources officer.    Social Determinants of Health   Financial Resource Strain:   . Difficulty of Paying Living Expenses: Not on file  Food Insecurity:   . Worried About Programme researcher, broadcasting/film/video in the Last Year: Not on file  . Ran Out of Food in the Last Year: Not on file  Transportation Needs:   . Lack of Transportation (Medical): Not on file  . Lack of Transportation (Non-Medical): Not on file  Physical Activity:   . Days of Exercise per Week: Not on file  . Minutes of Exercise per Session: Not on file  Stress:   . Feeling of Stress : Not on file  Social Connections:   . Frequency of Communication with Friends and Family: Not on file  . Frequency of Social Gatherings with Friends and Family: Not on file  . Attends Religious Services: Not on file  . Active Member of Clubs or Organizations: Not on file  . Attends Banker Meetings: Not on file  . Marital Status: Not on file  Intimate Partner Violence:   . Fear of Current or Ex-Partner: Not on file  . Emotionally Abused: Not on file  . Physically Abused: Not on file  . Sexually Abused: Not on file   Family History  Problem Relation Age of Onset  . Diabetes Mother   . Anxiety disorder Mother   . Depression Mother   . Depression Maternal Grandmother   . Colon cancer Neg Hx   . Crohn's disease Neg Hx   . Celiac disease Neg Hx      OBJECTIVE:  Vitals:   07/28/20 1343 07/28/20 1347  BP: 128/83   Pulse: (!) 108     Resp: 18   Temp: 98.1 F (36.7 C)   TempSrc: Oral   SpO2: 95%   Weight:  164 lb (74.4 kg)  Height:  5\' 4"  (1.626 m)    General appearance: alert; no distress Eyes: PERRLA; EOMI; conjunctiva normal HENT: normocephalic; atraumatic Neck: supple Lungs: clear to auscultation bilaterally without adventitious breath sounds Heart: regular rate and rhythm.  Clear S1 and S2 without rubs, gallops, or  murmur. Abdomen: soft, non-tender; bowel sounds normal; no masses or organomegaly; no guarding or rebound tenderness Extremities: no cyanosis or edema; symmetrical with no gross deformities Skin: warm and dry Psychological: alert and cooperative; anxious mood and affect  ECG: Orders placed or performed during the hospital encounter of 07/28/20  . ED EKG  . ED EKG   EKG subtle tachysinus rhythm without ST elevations, depressions, or prolonged PR interval.  No narrowing or widening of the QRS complexes.   ASSESSMENT & PLAN:  1. Chest pressure   2. Cough    Recommending further evaluation and management in the ED.  Cannot rule out cardiac cause or blood clot in urgent care setting.  Patient aware and in agreement with plan.  Will travel by private vehicle to ED.     Rennis Harding, PA-C 07/28/20 1439

## 2020-07-29 ENCOUNTER — Ambulatory Visit (INDEPENDENT_AMBULATORY_CARE_PROVIDER_SITE_OTHER): Payer: Medicaid Other | Admitting: Psychiatry

## 2020-07-29 ENCOUNTER — Encounter (HOSPITAL_COMMUNITY): Payer: Self-pay | Admitting: Emergency Medicine

## 2020-07-29 DIAGNOSIS — F321 Major depressive disorder, single episode, moderate: Secondary | ICD-10-CM | POA: Diagnosis not present

## 2020-07-29 LAB — NOVEL CORONAVIRUS, NAA: SARS-CoV-2, NAA: NOT DETECTED

## 2020-07-29 LAB — SARS-COV-2, NAA 2 DAY TAT

## 2020-07-29 MED ORDER — ALUM & MAG HYDROXIDE-SIMETH 200-200-20 MG/5ML PO SUSP
30.0000 mL | Freq: Once | ORAL | Status: AC
  Administered 2020-07-29: 30 mL via ORAL
  Filled 2020-07-29: qty 30

## 2020-07-29 MED ORDER — OMEPRAZOLE 20 MG PO CPDR: 20.0000 mg | DELAYED_RELEASE_CAPSULE | Freq: Every day | ORAL | 0 refills | Status: DC

## 2020-07-29 NOTE — ED Provider Notes (Signed)
[facility] Provider Note   CSN: [id number] Arrival date & time: 07/28/20  1744     History Chief Complaint  Patient presents with  . Chest Pain    Karen Shields is a 29 y.o. female.  The history is provided by the patient.  Chest Pain Pain location:  R chest Pain quality: pressure   Pain radiates to:  Does not radiate Pain severity:  Moderate Onset quality:  Gradual Timing:  Constant Progression:  Waxing and waning (worse at night ) Chronicity:  New Context: at rest   Relieved by:  Nothing Worsened by:  Nothing Ineffective treatments:  None tried Associated symptoms: anxiety   Associated symptoms: no abdominal pain, no diaphoresis, no dizziness, no fatigue, no fever, no lower extremity edema, no nausea, no orthopnea, no palpitations, no shortness of breath, no vomiting and no weakness   Risk factors: no aortic disease, no birth control, not female, no prior DVT/PE, no smoking and no surgery   Right sided pressure for 3 days worse at night.  No OCP, no travel, no leg pain or swelling.       Past Medical History:  Diagnosis Date  . Anxiety   . Asthma    WELL CONTROLLED  . Asthma   . Depression    pt stated  . Eczema 2008  . Gallstones   . GERD (gastroesophageal reflux disease)   . Headache   . Hx MRSA infection 09-2014  . IBS (irritable bowel syndrome)   . Ovarian cyst     Patient Active Problem List   Diagnosis Date Noted  . MDD (major depressive disorder), recurrent episode, severe (HCC) 12/22/2017  . Gallstone   . RUQ pain 12/25/2015  . Gallstones   . Reflux esophagitis   . Nausea without vomiting 11/04/2015  . Cholelithiases 11/04/2015  . GERD (gastroesophageal reflux disease) 11/04/2015  . Early satiety 11/04/2015  . MRSA (methicillin resistant staph aureus) culture positive 12/11/2014  . Menstrual migraine without status migrainosus, not intractable 07/10/2014  . Anxiety 06/14/2013  . Maternal substance  abuse (HCC) 04/14/2013  . H/O cold sores 04/12/2013  . Ovarian cyst, left 02/09/2011    Past Surgical History:  Procedure Laterality Date  . CHOLECYSTECTOMY N/A 01/16/2016   Procedure: LAPAROSCOPIC CHOLECYSTECTOMY;  Surgeon: Lattie Hawichard E Cooper, MD;  Location: [facility] ORS;  Service: General;  Laterality: N/A;  . ESOPHAGOGASTRODUODENOSCOPY N/A 11/13/2015   JYN:WGNFAOZRMR:erosive reflux   . OVARIAN CYST REMOVAL    . OVARIAN CYST REMOVAL  2011  . TUBAL LIGATION    . WISDOM TOOTH EXTRACTION       OB History    Gravida  2   Para  2   Term  1   Preterm  0   AB  0   Living  2     SAB  0   TAB  0   Ectopic  0   Multiple  0   Live Births  1           Family History  Problem Relation Age of Onset  . Diabetes Mother   . Anxiety disorder Mother   . Depression Mother   . Depression Maternal Grandmother   . Colon cancer Neg Hx   . Crohn's disease Neg Hx   . Celiac disease Neg Hx     Social History   Tobacco Use  . Smoking status: Current Every Day Smoker    Packs/day: 0.75    Years: 5.00  Pack years: 3.75    Types: Cigarettes    Start date: 02/11/2006  . Smokeless tobacco: Never Used  Vaping Use  . Vaping Use: Former  Substance Use Topics  . Alcohol use: Yes    Alcohol/week: 0.0 standard drinks    Comment: rarely, maybe once a year  . Drug use: Not Currently    Types: Benzodiazepines, Opium    Home Medications Prior to Admission medications   Medication Sig Start Date End Date Taking? Authorizing Provider  albuterol (VENTOLIN HFA) 108 (90 Base) MCG/ACT inhaler Inhale 2 puffs into the lungs every 6 (six) hours as needed for wheezing. 06/18/20  Yes Derwood Kaplan, MD  ALPRAZolam (XANAX) 1 MG tablet Take 1 tablet (1 mg total) by mouth daily as needed for anxiety. 07/18/20  Yes Myrlene Broker, MD  cetirizine (ZYRTEC) 10 MG tablet Take 10 mg by mouth daily as needed.    Yes [provider]  Cholecalciferol (VITAMIN D3) 5000 units CAPS Take by mouth.   Yes  [provider]  vortioxetine HBr (TRINTELLIX) 20 MG TABS tablet Take 1 tablet (20 mg total) by mouth daily. 07/18/20  Yes Myrlene Broker, MD    Allergies    Latex  Review of Systems   Review of Systems  Constitutional: Negative for diaphoresis, fatigue and fever.  HENT: Negative for congestion.   Respiratory: Negative for shortness of breath.   Cardiovascular: Positive for chest pain. Negative for palpitations and orthopnea.  Gastrointestinal: Negative for abdominal pain, nausea and vomiting.  Genitourinary: Negative for difficulty urinating.  Musculoskeletal: Negative for arthralgias.  Skin: Negative for rash.  Neurological: Negative for dizziness and weakness.  Psychiatric/Behavioral: Negative for agitation.  All other systems reviewed and are negative.   Physical Exam Updated Vital Signs BP (!) 130/100   Pulse 86   Temp 97.8 F (36.6 C) (Temporal)   Resp 18   Ht 5\' 4"  (1.626 m)   Wt 74.4 kg   SpO2 98%   BMI 28.15 kg/m   Physical Exam Vitals and nursing note reviewed.  Constitutional:      General: She is not in acute distress.    Appearance: Normal appearance.  HENT:     Head: Normocephalic and atraumatic.     Nose: Nose normal.  Eyes:     Conjunctiva/sclera: Conjunctivae normal.     Pupils: Pupils are equal, round, and reactive to light.  Cardiovascular:     Rate and Rhythm: Normal rate and regular rhythm.     Pulses: Normal pulses.     Heart sounds: Normal heart sounds.  Pulmonary:     Effort: Pulmonary effort is normal.     Breath sounds: Normal breath sounds.  Abdominal:     General: Abdomen is flat. Bowel sounds are normal.     Palpations: Abdomen is soft.     Tenderness: There is no abdominal tenderness. There is no guarding or rebound.  Musculoskeletal:        General: No swelling or tenderness. Normal range of motion.     Cervical back: Normal range of motion and neck supple.     Comments: 3+ dorsalis pedis and posterior tibials     Skin:    General: Skin is warm and dry.     Capillary Refill: Capillary refill takes less than 2 seconds.  Neurological:     General: No focal deficit present.     Mental Status: She is alert and oriented to person, place, and time.  Deep Tendon Reflexes: Reflexes normal.  Psychiatric:        Mood and Affect: Mood is anxious.     ED Results / Procedures / Treatments   Labs (all labs ordered are listed, but only abnormal results are displayed) Results for orders placed or performed during the hospital encounter of 07/28/20  Basic metabolic panel  Result Value Ref Range   Sodium 135 135 - 145 mmol/L   Potassium 3.3 (L) 3.5 - 5.1 mmol/L   Chloride 103 98 - 111 mmol/L   CO2 24 22 - 32 mmol/L   Glucose, Bld 101 (H) 70 - 99 mg/dL   BUN <5 (L) 6 - 20 mg/dL   Creatinine, Ser 3.14 0.44 - 1.00 mg/dL   Calcium 9.5 8.9 - 97.0 mg/dL   GFR, Estimated >26 >37 mL/min   Anion gap 8 5 - 15  CBC  Result Value Ref Range   WBC 9.6 4.0 - 10.5 K/uL   RBC 4.73 3.87 - 5.11 MIL/uL   Hemoglobin 14.1 12.0 - 15.0 g/dL   HCT 85.8 36 - 46 %   MCV 88.8 80.0 - 100.0 fL   MCH 29.8 26.0 - 34.0 pg   MCHC 33.6 30.0 - 36.0 g/dL   RDW 85.0 27.7 - 41.2 %   Platelets 407 (H) 150 - 400 K/uL   nRBC 0.0 0.0 - 0.2 %  I-Stat beta hCG blood, ED  Result Value Ref Range   I-stat hCG, quantitative <5.0 <5 mIU/mL   Comment 3          Troponin I (High Sensitivity)  Result Value Ref Range   Troponin I (High Sensitivity) <2 <18 ng/L  Troponin I (High Sensitivity)  Result Value Ref Range   Troponin I (High Sensitivity) <2 <18 ng/L   DG Chest 2 View  Result Date: 07/28/2020 CLINICAL DATA:  29 year old female with chest pain. EXAM: CHEST - 2 VIEW COMPARISON:  Chest radiograph dated 06/17/2020. FINDINGS: The heart size and mediastinal contours are within normal limits. Both lungs are clear. The visualized skeletal structures are unremarkable. IMPRESSION: No active cardiopulmonary disease. Electronically Signed   By:  Elgie Collard M.D.   On: 07/28/2020 18:42    EKG See muse  Radiology DG Chest 2 View  Result Date: 07/28/2020 CLINICAL DATA:  30 year old female with chest pain. EXAM: CHEST - 2 VIEW COMPARISON:  Chest radiograph dated 06/17/2020. FINDINGS: The heart size and mediastinal contours are within normal limits. Both lungs are clear. The visualized skeletal structures are unremarkable. IMPRESSION: No active cardiopulmonary disease. Electronically Signed   By: Elgie Collard M.D.   On: 07/28/2020 18:42    Procedures Procedures (including critical care time)  Medications Ordered in ED Medications  alum & mag hydroxide-simeth (MAALOX/MYLANTA) 200-200-20 MG/5ML suspension 30 mL (has no administration in time range)    ED Course  I have reviewed the triage vital signs and the nursing notes.  Pertinent labs & imaging results that were available during my care of the patient were reviewed by me and considered in my medical decision making (see chart for details).    Ruled out for MI in the ED.  HEART score is 1, low risk for MACE.  PERC negative wells 0 highly doubt PE in this low risk for PE.  Patient is very anxious but symptoms are most consistent with GERD although anxiety is not helping this.  GERD friendly diet and nothing to eat or drink several hours before bed.    Karen Croak  Shields was evaluated in Emergency Department on 07/29/2020 for the symptoms described in the history of present illness. She was evaluated in the context of the global COVID-19 pandemic, which necessitated consideration that the patient might be at risk for infection with the SARS-CoV-2 virus that causes COVID-19. Institutional protocols and algorithms that pertain to the evaluation of patients at risk for COVID-19 are in a state of rapid change based on information released by regulatory bodies including the CDC and federal and state organizations. These policies and algorithms were followed during the patient's care in  the ED.\  Final Clinical Impression(s) / ED Diagnoses Return for intractable cough, coughing up blood,fevers >100.4 unrelieved by medication, shortness of breath, intractable vomiting, chest pain, shortness of breath, weakness,numbness, changes in speech, facial asymmetry,abdominal pain, passing out,Inability to tolerate liquids or food, cough, altered mental status or any concerns. No signs of systemic illness or infection. The patient is nontoxic-appearing on exam and vital signs are within normal limits.   I have reviewed the triage vital signs and the nursing notes. Pertinent labs &imaging results that were available during my care of the patient were reviewed by me and considered in my medical decision making (see chart for details).After history, exam, and medical workup I feel the patient has beenappropriately medically screened and is safe for discharge home. Pertinent diagnoses were discussed with the patient. Patient was given return precautions.   Harlan Ervine, MD 07/29/20 [phone]
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
## 2020-07-30 ENCOUNTER — Telehealth: Payer: Self-pay

## 2020-07-30 NOTE — Telephone Encounter (Signed)
Transition Care Management Unsuccessful Follow-up Telephone Call  Date of discharge and from where: 07/29/2020 [facility]   Attempts:  1st Attempt  Reason for unsuccessful TCM follow-up call:  Left voice message

## 2020-07-31 NOTE — Telephone Encounter (Signed)
Transition Care Management Follow-up Telephone Call  Date of discharge and from where: 07/29/2020 [facility]  How have you been since you were released from the hospital?Feeling a bit better, taking medication for acid reflux.  Any questions or concerns? No  Items Reviewed:  Did the pt receive and understand the discharge instructions provided? Yes   Medications obtained and verified? Yes   Other? No   Any new allergies since your discharge? No   Dietary orders reviewed? Yes  Do you have support at home? Yes   Home Care and Equipment/Supplies: Were home health services ordered? not applicable If so, what is the name of the agency? n/a  Has the agency set up a time to come to the patient's home? not applicable Were any new equipment or medical supplies ordered?  No What is the name of the medical supply agency? na Were you able to get the supplies/equipment? not applicable Do you have any questions related to the use of the equipment or supplies? No  Functional Questionnaire: (I = Independent and D = Dependent) ADLs: I  Bathing/Dressing- I  Meal Prep- I  Eating- I  Maintaining continence- I  Transferring/Ambulation- I  Managing Meds- I  Follow up appointments reviewed:   PCP Hospital f/u appt confirmed? No    Specialist Hospital f/u appt confirmed? No    Are transportation arrangements needed? No   If their condition worsens, is the pt aware to call PCP or go to the Emergency Dept.? Yes  Was the patient provided with contact information for the PCP's office or ED? Yes  Was to pt encouraged to call back with questions or concerns? Yes  Patient confirmed her PCP is still Oval Linsey, MD. Patient stated she will contact them for a follow up appointment, she is wanting to see if the acid reflux medication she is taking will help.

## 2020-07-31 NOTE — Telephone Encounter (Signed)
Transition Care Management Unsuccessful Follow-up Telephone Call  Date of discharge and from where:  07/29/2020 [facility]     Attempts:  2nd Attempt  Reason for unsuccessful TCM follow-up call:  Left voice message

## 2020-08-19 ENCOUNTER — Other Ambulatory Visit: Payer: Self-pay

## 2020-08-19 ENCOUNTER — Ambulatory Visit (INDEPENDENT_AMBULATORY_CARE_PROVIDER_SITE_OTHER): Payer: Medicaid Other | Admitting: Psychiatry

## 2020-08-19 DIAGNOSIS — F4310 Post-traumatic stress disorder, unspecified: Secondary | ICD-10-CM

## 2020-08-19 DIAGNOSIS — F321 Major depressive disorder, single episode, moderate: Secondary | ICD-10-CM
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
## 2020-09-02 ENCOUNTER — Ambulatory Visit (HOSPITAL_COMMUNITY): Payer: Medicaid Other | Admitting: Psychiatry

## 2020-09-09 DIAGNOSIS — K5900 Constipation, unspecified: Secondary | ICD-10-CM | POA: Diagnosis not present

## 2020-09-23 ENCOUNTER — Ambulatory Visit (INDEPENDENT_AMBULATORY_CARE_PROVIDER_SITE_OTHER): Payer: Medicaid Other | Admitting: Psychiatry

## 2020-09-23 ENCOUNTER — Other Ambulatory Visit: Payer: Self-pay

## 2020-09-23 DIAGNOSIS — F321 Major depressive disorder, single episode, moderate: Secondary | ICD-10-CM

## 2020-09-23 DIAGNOSIS — F4310 Post-traumatic stress disorder, unspecified: Secondary | ICD-10-CM | POA: Diagnosis not present
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
## 2020-10-13 DIAGNOSIS — F902 Attention-deficit hyperactivity disorder, combined type: Secondary | ICD-10-CM | POA: Diagnosis not present

## 2020-10-14 ENCOUNTER — Other Ambulatory Visit (HOSPITAL_COMMUNITY): Payer: Self-pay | Admitting: Psychiatry

## 2020-10-14 ENCOUNTER — Ambulatory Visit (INDEPENDENT_AMBULATORY_CARE_PROVIDER_SITE_OTHER): Payer: Medicaid Other | Admitting: Psychiatry

## 2020-10-14 ENCOUNTER — Other Ambulatory Visit: Payer: Self-pay

## 2020-10-14 DIAGNOSIS — F321 Major depressive disorder, single episode, moderate: Secondary | ICD-10-CM

## 2020-10-14 DIAGNOSIS — F4310 Post-traumatic stress disorder, unspecified: Secondary | ICD-10-CM
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
## 2020-10-16 ENCOUNTER — Other Ambulatory Visit: Payer: Self-pay

## 2020-10-16 ENCOUNTER — Telehealth (INDEPENDENT_AMBULATORY_CARE_PROVIDER_SITE_OTHER): Payer: Medicaid Other | Admitting: Psychiatry

## 2020-10-16 ENCOUNTER — Encounter (HOSPITAL_COMMUNITY): Payer: Self-pay | Admitting: Psychiatry

## 2020-10-16 DIAGNOSIS — F321 Major depressive disorder, single episode, moderate: Secondary | ICD-10-CM | POA: Diagnosis not present

## 2020-10-16 MED ORDER — ALPRAZOLAM 1 MG PO TABS
ORAL_TABLET | ORAL | 2 refills | Status: DC
Start: 2020-10-16 — End: 2021-01-12

## 2020-10-16 MED ORDER — VORTIOXETINE HBR 20 MG PO TABS
20.0000 mg | ORAL_TABLET | Freq: Every day | ORAL | 2 refills | Status: DC
Start: 2020-10-16 — End: 2021-01-20
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
## 2020-10-27 ENCOUNTER — Other Ambulatory Visit: Payer: Self-pay

## 2020-10-27 ENCOUNTER — Ambulatory Visit (INDEPENDENT_AMBULATORY_CARE_PROVIDER_SITE_OTHER): Payer: Medicaid Other | Admitting: Psychiatry

## 2020-10-27 DIAGNOSIS — F321 Major depressive disorder, single episode, moderate: Secondary | ICD-10-CM | POA: Diagnosis not present

## 2020-10-27 DIAGNOSIS — F4310 Post-traumatic stress disorder, unspecified: Secondary | ICD-10-CM | POA: Diagnosis not present
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
## 2020-11-10 ENCOUNTER — Ambulatory Visit (HOSPITAL_COMMUNITY): Payer: Medicaid Other | Admitting: Psychiatry

## 2020-11-21 ENCOUNTER — Ambulatory Visit (INDEPENDENT_AMBULATORY_CARE_PROVIDER_SITE_OTHER): Payer: Medicaid Other | Admitting: Psychiatry

## 2020-11-21 ENCOUNTER — Other Ambulatory Visit: Payer: Self-pay

## 2020-11-21 DIAGNOSIS — F321 Major depressive disorder, single episode, moderate: Secondary | ICD-10-CM

## 2020-11-21 DIAGNOSIS — F4310 Post-traumatic stress disorder, unspecified: Secondary | ICD-10-CM
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
## 2020-11-22 ENCOUNTER — Emergency Department (HOSPITAL_COMMUNITY)
Admission: EM | Admit: 2020-11-22 | Discharge: 2020-11-22 | Disposition: A | Discharge status: Home / Self Care | Payer: Medicaid Other | Attending: Emergency Medicine | Admitting: Emergency Medicine

## 2020-11-22 ENCOUNTER — Other Ambulatory Visit: Payer: Self-pay

## 2020-11-22 ENCOUNTER — Encounter (HOSPITAL_COMMUNITY): Payer: Self-pay | Admitting: *Deleted

## 2020-11-22 DIAGNOSIS — Z91040 Latex allergy status: Secondary | ICD-10-CM | POA: Diagnosis not present

## 2020-11-22 DIAGNOSIS — R220 Localized swelling, mass and lump, head: Secondary | ICD-10-CM | POA: Insufficient documentation

## 2020-11-22 DIAGNOSIS — F17210 Nicotine dependence, cigarettes, uncomplicated: Secondary | ICD-10-CM | POA: Insufficient documentation

## 2020-11-22 DIAGNOSIS — J45909 Unspecified asthma, uncomplicated: Secondary | ICD-10-CM | POA: Diagnosis not present

## 2020-11-22 DIAGNOSIS — R21 Rash and other nonspecific skin eruption: Secondary | ICD-10-CM | POA: Insufficient documentation

## 2020-11-22 DIAGNOSIS — R059 Cough, unspecified: Secondary | ICD-10-CM

## 2020-11-22 DIAGNOSIS — J029 Acute pharyngitis, unspecified: Secondary | ICD-10-CM | POA: Insufficient documentation

## 2020-11-22 DIAGNOSIS — Z20822 Contact with and (suspected) exposure to covid-19: Secondary | ICD-10-CM | POA: Insufficient documentation

## 2020-11-22 DIAGNOSIS — L739 Follicular disorder, unspecified: Secondary | ICD-10-CM | POA: Diagnosis not present

## 2020-11-22 MED ORDER — DOXYCYCLINE HYCLATE 100 MG PO CAPS: 100.0000 mg | ORAL_CAPSULE | Freq: Two times a day (BID) | ORAL | 0 refills | Status: AC

## 2020-11-22 MED ORDER — MUPIROCIN CALCIUM 2 % NA OINT: TOPICAL_OINTMENT | NASAL | 0 refills | Status: DC

## 2020-11-22 NOTE — Discharge Instructions (Signed)
Use the doxycycline and mupirocin as directed.   Your Covid test will result tomorrow.  You can follow-up on your MyChart to check the result.  If positive you will need to quarantine.  Please follow-up with your regular doctor within the next 3 to 5 days for reassessment and return to the emergency department for new or worsening symptoms anytime.

## 2020-11-22 NOTE — ED Notes (Signed)
Pt reports having a facial today now reports having raised lesions on chin. Area red and swollen

## 2020-11-22 NOTE — ED Provider Notes (Signed)
[facility] EMERGENCY DEPARTMENT Provider Note   CSN: [id number] Arrival date & time: 11/22/20  1733     History Chief Complaint  Patient presents with  . Facial Swelling    Karen Shields is a 30 y.o. female.  HPI   30 year old female with a history of anxiety, asthma, depression, eczema, gallstones, GERD, headache, MRSA, IBS, ovarian cyst, who presents to the emergency department today for evaluation of a rash.  States that she had laser hair removal to her face a few days ago.  Following this she experienced some redness and swelling of her face.  She has developed pustules and purulent drainage from the wounds.  She has had no fevers.  She does note that following this procedure she started having a sore throat and cough.  She has not had her Covid vaccine.  She does not think that she has had any Covid exposures but does state that she has children that are currently in school.  Past Medical History:  Diagnosis Date  . Anxiety   . Asthma    WELL CONTROLLED  . Asthma   . Depression    pt stated  . Eczema 2008  . Gallstones   . GERD (gastroesophageal reflux disease)   . Headache   . Hx MRSA infection 09-2014  . IBS (irritable bowel syndrome)   . Ovarian cyst     Patient Active Problem List   Diagnosis Date Noted  . MDD (major depressive disorder), recurrent episode, severe (HCC) 12/22/2017  . Gallstone   . RUQ pain 12/25/2015  . Gallstones   . Reflux esophagitis   . Nausea without vomiting 11/04/2015  . Cholelithiases 11/04/2015  . GERD (gastroesophageal reflux disease) 11/04/2015  . Early satiety 11/04/2015  . MRSA (methicillin resistant staph aureus) culture positive 12/11/2014  . Menstrual migraine without status migrainosus, not intractable 07/10/2014  . Anxiety 06/14/2013  . Maternal substance abuse (HCC) 04/14/2013  . H/O cold sores 04/12/2013  . Ovarian cyst, left 02/09/2011    Past Surgical History:  Procedure Laterality Date  . CHOLECYSTECTOMY  N/A 01/16/2016   Procedure: LAPAROSCOPIC CHOLECYSTECTOMY;  Surgeon: Lattie Haw, MD;  Location: [facility] ORS;  Service: General;  Laterality: N/A;  . ESOPHAGOGASTRODUODENOSCOPY N/A 11/13/2015   BWL:SLHTDSK reflux   . OVARIAN CYST REMOVAL    . OVARIAN CYST REMOVAL  2011  . TUBAL LIGATION    . WISDOM TOOTH EXTRACTION       OB History    Gravida  2   Para  2   Term  1   Preterm  0   AB  0   Living  2     SAB  0   IAB  0   Ectopic  0   Multiple  0   Live Births  1           Family History  Problem Relation Age of Onset  . Diabetes Mother   . Anxiety disorder Mother   . Depression Mother   . Depression Maternal Grandmother   . Colon cancer Neg Hx   . Crohn's disease Neg Hx   . Celiac disease Neg Hx     Social History   Tobacco Use  . Smoking status: Current Every Day Smoker    Packs/day: 0.75    Years: 5.00    Pack years: 3.75    Types: Cigarettes    Start date: 02/11/2006  . Smokeless tobacco: Never Used  Vaping Use  . Vaping Use: Former  Substance Use Topics  . Alcohol use: Yes    Alcohol/week: 0.0 standard drinks    Comment: rarely, maybe once a year  . Drug use: Not Currently    Types: Benzodiazepines, Opium    Home Medications Prior to Admission medications   Medication Sig Start Date End Date Taking? Authorizing Provider  albuterol (VENTOLIN HFA) 108 (90 Base) MCG/ACT inhaler Inhale 2 puffs into the lungs every 6 (six) hours as needed for wheezing. 06/18/20  Yes Derwood Kaplan, MD  ALPRAZolam (XANAX) 1 MG tablet TAKE 1 TABLET BY MOUTH ONCE A DAY AS NEEDED FOR ANXIETY. 10/16/20  Yes Myrlene Broker, MD  amphetamine-dextroamphetamine (ADDERALL) 20 MG tablet Take 10 mg by mouth 3 (three) times daily. 11/12/20  Yes [provider]  buprenorphine-naloxone (SUBOXONE) 8-2 mg SUBL SL tablet Place 1 tablet under the tongue 2 (two) times daily. 11/11/20  Yes [provider]  cetirizine (ZYRTEC) 10 MG tablet Take 10 mg by mouth daily as  needed.    Yes [provider]  Cholecalciferol (VITAMIN D3) 5000 units CAPS Take by mouth.   Yes [provider]  doxycycline (VIBRAMYCIN) 100 MG capsule Take 1 capsule (100 mg total) by mouth 2 (two) times daily for 7 days. 11/22/20 11/29/20 Yes Couture, Cortni S, PA-C  mupirocin nasal ointment (BACTROBAN) 2 % Apply over the rash twice daily for 1 week 11/22/20  Yes Couture, Cortni S, PA-C  omeprazole (PRILOSEC) 20 MG capsule Take 1 capsule (20 mg total) by mouth daily. 07/29/20  Yes Palumbo, April, MD  vortioxetine HBr (TRINTELLIX) 20 MG TABS tablet Take 1 tablet (20 mg total) by mouth daily. 10/16/20  Yes Myrlene Broker, MD    Allergies    Latex  Review of Systems   Review of Systems  Constitutional: Negative for fever.  HENT: Positive for facial swelling and sore throat.   Respiratory: Positive for cough.   Skin: Positive for rash.    Physical Exam Updated Vital Signs BP (!) 135/95   Pulse (!) 106   Temp 98.4 F (36.9 C) (Oral)   Resp 20   SpO2 99%   Physical Exam Vitals and nursing note reviewed.  Constitutional:      General: She is not in acute distress.    Appearance: She is well-developed and well-nourished.  HENT:     Head: Normocephalic and atraumatic.  Eyes:     Conjunctiva/sclera: Conjunctivae normal.  Cardiovascular:     Rate and Rhythm: Normal rate.  Pulmonary:     Effort: Pulmonary effort is normal.  Musculoskeletal:        General: Normal range of motion.     Cervical back: Neck supple.  Skin:    General: Skin is warm and dry.     Findings: Rash present.     Comments: Erythematous pustules noted around the mouth, chin and upper neck. There are honey crusted lesions with some yellow drainage noted to the chin.  Neurological:     Mental Status: She is alert.  Psychiatric:        Mood and Affect: Mood and affect normal.         ED Results / Procedures / Treatments   Labs (all labs ordered are listed, but only abnormal results are  displayed) Labs Reviewed  SARS CORONAVIRUS 2 (TAT 6-24 HRS)    EKG None  Radiology No results found.  Procedures Procedures   Medications Ordered in ED Medications - No data to display  ED Course  I  have reviewed the triage vital signs and the nursing notes.  Pertinent labs & imaging results that were available during my care of the patient were reviewed by me and considered in my medical decision making (see chart for details).    MDM Rules/Calculators/A&P                          30 year old female presenting for evaluation of a rash to her face after receiving laser hair removal.  It appears that she has what looks like folliculitis to the mouth and chin area but she also has some honey crusted lesions to the chin consistent with possible impetigo.  Will cover her with mupirocin as well as doxycycline.  Advised warm compresses.  Advise she can take Benadryl for itching and Tylenol and Motrin for the pain.  Additionally she is complaining of a sore throat and a cough and has not been vaccinated against Covid.  We will test her for this.  Her lungs are clear to auscultation bilaterally and her throat does not appear consistent with a strep throat. centor score 1.  Have advised that she follow-up with her PCP within next 3 to 5 days for reassessment and return to the emergency department for any new or worsening symptoms in the meantime.  She voices understanding of plan and reasons to return.  All questions answered.  Patient stable for discharge.   Final Clinical Impression(s) / ED Diagnoses Final diagnoses:  Rash  Cough    Rx / DC Orders ED Discharge Orders         Ordered    mupirocin nasal ointment (BACTROBAN) 2 %        11/22/20 1953    doxycycline (VIBRAMYCIN) 100 MG capsule  2 times daily        11/22/20 [address], PA-C 11/22/20 1953    Vanetta Mulders, MD 11/27/20 [id number]

## 2020-11-22 NOTE — ED Notes (Signed)
Pt reports having

## 2020-11-22 NOTE — ED Triage Notes (Signed)
States she has a laser hair removal on her face 3 days ago. Swelling of chin and throat area with rash

## 2020-11-23 LAB — SARS CORONAVIRUS 2 (TAT 6-24 HRS): SARS Coronavirus 2: NEGATIVE

## 2020-12-16 DIAGNOSIS — K219 Gastro-esophageal reflux disease without esophagitis: Secondary | ICD-10-CM | POA: Diagnosis not present

## 2020-12-16 DIAGNOSIS — R5382 Chronic fatigue, unspecified: Secondary | ICD-10-CM | POA: Diagnosis not present

## 2021-01-10 ENCOUNTER — Other Ambulatory Visit (HOSPITAL_COMMUNITY): Payer: Self-pay | Admitting: Psychiatry

## 2021-01-14 ENCOUNTER — Other Ambulatory Visit: Payer: Self-pay

## 2021-01-14 ENCOUNTER — Ambulatory Visit (INDEPENDENT_AMBULATORY_CARE_PROVIDER_SITE_OTHER): Payer: Medicaid Other | Admitting: Psychiatry

## 2021-01-14 DIAGNOSIS — F321 Major depressive disorder, single episode, moderate: Secondary | ICD-10-CM

## 2021-01-14 DIAGNOSIS — F4310 Post-traumatic stress disorder, unspecified: Secondary | ICD-10-CM
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
## 2021-01-16 DIAGNOSIS — I110 Hypertensive heart disease with heart failure: Secondary | ICD-10-CM | POA: Diagnosis not present

## 2021-01-16 DIAGNOSIS — N390 Urinary tract infection, site not specified: Secondary | ICD-10-CM | POA: Diagnosis not present

## 2021-01-16 DIAGNOSIS — K219 Gastro-esophageal reflux disease without esophagitis: Secondary | ICD-10-CM | POA: Diagnosis not present

## 2021-01-20 ENCOUNTER — Telehealth (INDEPENDENT_AMBULATORY_CARE_PROVIDER_SITE_OTHER): Payer: Medicaid Other | Admitting: Psychiatry

## 2021-01-20 ENCOUNTER — Encounter (HOSPITAL_COMMUNITY): Payer: Self-pay | Admitting: Psychiatry

## 2021-01-20 ENCOUNTER — Other Ambulatory Visit: Payer: Self-pay

## 2021-01-20 DIAGNOSIS — F321 Major depressive disorder, single episode, moderate: Secondary | ICD-10-CM | POA: Diagnosis not present

## 2021-01-20 MED ORDER — ALPRAZOLAM 1 MG PO TABS: 1.0000 mg | ORAL_TABLET | Freq: Every day | ORAL | 0 refills | Status: DC | PRN

## 2021-01-20 MED ORDER — VORTIOXETINE HBR 20 MG PO TABS
20.0000 mg | ORAL_TABLET | Freq: Every day | ORAL | 2 refills | Status: DC
Start: 2021-01-20 — End: 2021-04-16
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
## 2021-01-28 ENCOUNTER — Other Ambulatory Visit: Payer: Self-pay

## 2021-01-28 ENCOUNTER — Ambulatory Visit (INDEPENDENT_AMBULATORY_CARE_PROVIDER_SITE_OTHER): Payer: Medicaid Other | Admitting: Psychiatry

## 2021-01-28 DIAGNOSIS — F4310 Post-traumatic stress disorder, unspecified: Secondary | ICD-10-CM

## 2021-01-28 DIAGNOSIS — F321 Major depressive disorder, single episode, moderate: Secondary | ICD-10-CM
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
## 2021-02-05 DIAGNOSIS — N390 Urinary tract infection, site not specified: Secondary | ICD-10-CM | POA: Diagnosis not present

## 2021-02-11 ENCOUNTER — Ambulatory Visit (HOSPITAL_COMMUNITY): Payer: Medicaid Other | Admitting: Psychiatry

## 2021-02-11 ENCOUNTER — Other Ambulatory Visit: Payer: Self-pay

## 2021-02-11 ENCOUNTER — Telehealth (HOSPITAL_COMMUNITY): Payer: Self-pay | Admitting: Psychiatry

## 2021-02-11 NOTE — Telephone Encounter (Signed)
Therapist attempted to contact patient twice via text through care agility platform for scheduled appointment, no response.  Therapist called patient, left message indicating attempt, and requesting patient call office. 

## 2021-02-20 ENCOUNTER — Ambulatory Visit (HOSPITAL_COMMUNITY): Payer: Medicaid Other | Admitting: Psychiatry

## 2021-03-09 ENCOUNTER — Other Ambulatory Visit (HOSPITAL_COMMUNITY): Payer: Self-pay | Admitting: Psychiatry

## 2021-03-30 ENCOUNTER — Other Ambulatory Visit: Payer: Self-pay

## 2021-03-30 ENCOUNTER — Ambulatory Visit (HOSPITAL_COMMUNITY): Payer: Medicaid Other | Admitting: Psychiatry

## 2021-04-06 ENCOUNTER — Telehealth (HOSPITAL_COMMUNITY): Payer: Self-pay | Admitting: *Deleted

## 2021-04-06 ENCOUNTER — Telehealth: Payer: Self-pay | Admitting: Psychiatry

## 2021-04-06 DIAGNOSIS — F321 Major depressive disorder, single episode, moderate: Secondary | ICD-10-CM

## 2021-04-06 DIAGNOSIS — F4310 Post-traumatic stress disorder, unspecified: Secondary | ICD-10-CM

## 2021-04-06 MED ORDER — ALPRAZOLAM 1 MG PO TABS: 1.0000 mg | ORAL_TABLET | Freq: Every day | ORAL | 0 refills | Status: DC | PRN

## 2021-04-06 NOTE — Telephone Encounter (Signed)
Patient is calling for refill for her Xanax. Patient current number to call her back on is [phone].

## 2021-04-06 NOTE — Telephone Encounter (Signed)
I have sent limited supply of Xanax.  Patient does have upcoming appointment with Dr. Tenny Craw, end of July.  Will route this message  so Dr. Tenny Craw can address future refills when she returns.

## 2021-04-07 NOTE — Telephone Encounter (Signed)
Informed patient and she verbalized understanding.  

## 2021-04-13 ENCOUNTER — Ambulatory Visit (HOSPITAL_COMMUNITY): Payer: Medicaid Other | Admitting: Psychiatry

## 2021-04-13 ENCOUNTER — Telehealth (HOSPITAL_COMMUNITY): Payer: Self-pay | Admitting: Psychiatry

## 2021-04-13 ENCOUNTER — Other Ambulatory Visit: Payer: Self-pay

## 2021-04-13 NOTE — Telephone Encounter (Signed)
Therapist contacted patient via text through caregility platform for scheduled appointment.  Patient was in route with her family to pick up her daughter from school and did not have confidentiality.  Therapist and patient agreed to reschedule appointment.

## 2021-04-16 ENCOUNTER — Encounter (HOSPITAL_COMMUNITY): Payer: Self-pay | Admitting: Psychiatry

## 2021-04-16 ENCOUNTER — Telehealth (INDEPENDENT_AMBULATORY_CARE_PROVIDER_SITE_OTHER): Payer: Medicaid Other | Admitting: Psychiatry

## 2021-04-16 ENCOUNTER — Other Ambulatory Visit: Payer: Self-pay

## 2021-04-16 DIAGNOSIS — F4310 Post-traumatic stress disorder, unspecified: Secondary | ICD-10-CM

## 2021-04-16 DIAGNOSIS — F321 Major depressive disorder, single episode, moderate: Secondary | ICD-10-CM | POA: Diagnosis not present

## 2021-04-16 MED ORDER — VORTIOXETINE HBR 20 MG PO TABS
20.0000 mg | ORAL_TABLET | Freq: Every day | ORAL | 2 refills | Status: DC
Start: 2021-04-16 — End: 2021-07-13

## 2021-04-16 MED ORDER — ALPRAZOLAM 1 MG PO TABS: 1.0000 mg | ORAL_TABLET | Freq: Every day | ORAL | 2 refills | Status: DC | PRN

## 2021-04-16 NOTE — Progress Notes (Signed)
Virtual Visit via Telephone Note  I connected with Karen Shields on 04/16/21 at  1:00 PM EDT by telephone and verified that I am speaking with the correct person using two identifiers.  Location: Patient: home Provider: home office   I discussed the limitations, risks, security and privacy concerns of performing an evaluation and management service by telephone and the availability of in person appointments. I also discussed with the patient that there may be a patient responsible charge related to this service. The patient expressed understanding and agreed to proceed.      I discussed the assessment and treatment plan with the patient. The patient was provided an opportunity to ask questions and all were answered. The patient agreed with the plan and demonstrated an understanding of the instructions.   The patient was advised to call back or seek an in-person evaluation if the symptoms worsen or if the condition fails to improve as anticipated.  I provided 15 minutes of non-face-to-face time during this encounter.   [name], MD  [facility] MD/PA/NP OP Progress Note  04/16/2021 1:27 PM Karen Shields  MRN:  [id number]  Chief Complaint:  Chief Complaint   Anxiety; Depression; Follow-up    HPIThis patient is a 30year-old separated white female who lives with her mother and her 2 daughters in [place].  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   The patient was referred by the behavioral health Hospital where she was hospitalized from 4/4 through 12/26/2017 for symptoms of depression anxiety and suicidal ideation.  Patient returns after 3 months regarding her posttraumatic stress disorder and depression.  She states that for the most part she is doing okay.  She has been more anxious lately because her ex-husband has started drinking again and he is spending time with his ex-girlfriend's father.  She also suspects he is spending time with the ex-girlfriend as well.  She claims  that she "still loves him" but is very hurt by this behavior.  She realizes she would be better off without them but it is difficult for her to separate.  She is no longer using the Suboxone.  She is also no longer on Adderall.  She claims she has no cravings for narcotics.  She feels that her depression is under good control with the Trintellix and she is staying busy and active with her children.  She denies suicidal ideation.  She uses Xanax occasionally for anxiety. Visit Diagnosis:    ICD-10-CM   1. Current moderate episode of major depressive disorder without prior episode (HCC)  F32.1     2. PTSD (post-traumatic stress disorder)  F43.10 ALPRAZolam (XANAX) 1 MG tablet      Past Psychiatric History: Past outpatient treatment at [facility].  She had 1 hospitalization 3 years ago for depression with suicidal ideation  Past Medical History:  Past Medical History:  Diagnosis Date   Anxiety    Asthma    WELL CONTROLLED   Asthma    Depression    pt stated   Eczema 2008   Gallstones    GERD (gastroesophageal reflux disease)    Headache    Hx MRSA infection 09-2014   IBS (irritable bowel syndrome)    Ovarian cyst     Past Surgical History:  Procedure Laterality Date   CHOLECYSTECTOMY N/A 01/16/2016   Procedure: LAPAROSCOPIC CHOLECYSTECTOMY;  Surgeon: Lattie Haw, MD;  Location: [facility] ORS;  Service: General;  Laterality: N/A;   ESOPHAGOGASTRODUODENOSCOPY N/A 11/13/2015   WSF:KCLEXNT reflux  OVARIAN CYST REMOVAL     OVARIAN CYST REMOVAL  2011   TUBAL LIGATION     WISDOM TOOTH EXTRACTION      Family Psychiatric History: see below  Family History:  Family History  Problem Relation Age of Onset   Diabetes Mother    Anxiety disorder Mother    Depression Mother    Depression Maternal Grandmother    Colon cancer Neg Hx    Crohn's disease Neg Hx    Celiac disease Neg Hx     Social History:  Social History   Socioeconomic History   Marital status: Legally Separated     Spouse name: Not on file   Number of children: 2   Years of education: Not on file   Highest education level: Not on file  Occupational History   Not on file  Tobacco Use   Smoking status: Every Day    Packs/day: 0.75    Years: 5.00    Pack years: 3.75    Types: Cigarettes    Start date: 02/11/2006   Smokeless tobacco: Never  Vaping Use   Vaping Use: Former  Substance and Sexual Activity   Alcohol use: Yes    Alcohol/week: 0.0 standard drinks    Comment: rarely, maybe once a year   Drug use: Not Currently    Types: Benzodiazepines, Opium   Sexual activity: Yes    Partners: Male    Birth control/protection: Surgical  Other Topics Concern   Not on file  Social History Narrative   ** Merged History Encounter **    Four daughters between herself and husband, two Human resources officer.    Social Determinants of Health   Financial Resource Strain: Not on file  Food Insecurity: Not on file  Transportation Needs: Not on file  Physical Activity: Not on file  Stress: Not on file  Social Connections: Not on file    Allergies:  Allergies  Allergen Reactions   Latex Dermatitis    Metabolic Disorder Labs: No results found for: HGBA1C, MPG No results found for: PROLACTIN No results found for: CHOL, TRIG, HDL, CHOLHDL, VLDL, LDLCALC Lab Results  Component Value Date   TSH 1.780 04/23/2020   TSH 0.326 (L) 12/22/2017    Therapeutic Level Labs: No results found for: LITHIUM No results found for: VALPROATE No components found for:  CBMZ  Current Medications: Current Outpatient Medications  Medication Sig Dispense Refill   albuterol (VENTOLIN HFA) 108 (90 Base) MCG/ACT inhaler Inhale 2 puffs into the lungs every 6 (six) hours as needed for wheezing. 18 g 5   ALPRAZolam (XANAX) 1 MG tablet Take 1 tablet (1 mg total) by mouth daily as needed for anxiety. 30 tablet 2   cetirizine (ZYRTEC) 10 MG tablet Take 10 mg by mouth daily as needed.      Cholecalciferol (VITAMIN D3) 5000 units  CAPS Take by mouth.     mupirocin nasal ointment (BACTROBAN) 2 % Apply over the rash twice daily for 1 week 1 g 0   omeprazole (PRILOSEC) 20 MG capsule Take 1 capsule (20 mg total) by mouth daily. 30 capsule 0   vortioxetine HBr (TRINTELLIX) 20 MG TABS tablet Take 1 tablet (20 mg total) by mouth daily. 30 tablet 2   No current facility-administered medications for this visit.     Musculoskeletal: Strength & Muscle Tone: within normal limits Gait & Station: normal Patient leans: N/A  Psychiatric Specialty Exam: Review of Systems  Psychiatric/Behavioral:  The patient is nervous/anxious.   All  other systems reviewed and are negative.  There were no vitals taken for this visit.There is no height or weight on file to calculate BMI.  General Appearance: NA  Eye Contact:  NA  Speech:  Clear and Coherent  Volume:  Normal  Mood:  Anxious and Euthymic  Affect:  NA  Thought Process:  Goal Directed  Orientation:  Full (Time, Place, and Person)  Thought Content: Logical   Suicidal Thoughts:  No  Homicidal Thoughts:  No  Memory:  Immediate;   Good Recent;   Good Remote;   Good  Judgement:  Good  Insight:  Fair  Psychomotor Activity:  Normal  Concentration:  Concentration: Good and Attention Span: Good  Recall:  Good  Fund of Knowledge: Good  Language: Good  Akathisia:  No  Handed:  Right  AIMS (if indicated): not done  Assets:  Communication Skills Desire for Improvement Physical Health Resilience Social Support Talents/Skills  ADL's:  Intact  Cognition: WNL  Sleep:  Good   Screenings: AIMS    Flowsheet Row [facility]  AIMS Total Score 0      AUDIT    Flowsheet Row [facility]  Alcohol Use Disorder Identification Test Final Score (AUDIT) 2      GAD-7    Flowsheet Row Counselor [facility]16[facility]2018 [facility] Counselor from 04/14/2018 [facility]  Total GAD-7 Score 6 9      PHQ2-9    Flowsheet Row Video Visit from 04/16/2021 in [facility] Counselor from 01/28/2021 in [facility] Video Visit from 01/20/2021 in [facility] Counselor from 07/29/2020 in [facility] Counselor from 10/16/2018 in [facility]  PHQ-2 Total Score 1 1 0 2 1  PHQ-9 Total Score -- -- -- 12 --      Flowsheet Row Video Visit from 04/16/2021 in [facility] Counselor from 01/28/2021 in [facility] Video Visit from 01/20/2021 in [facility]  C-SSRS RISK CATEGORY No Risk No Risk No Risk        Assessment and Plan: This patient is a 30 year old female with a history of depression and anxiety.  She is doing well on her current regimen.  She will continue Trintellix 20 mg daily for depression and Xanax 1 mg daily only as needed for anxiety.  She is no longer taking any other controlled drugs.  She will return to see me in 3 months   [name], MD 04/16/2021, 1:27 PM

## 2021-04-27 ENCOUNTER — Other Ambulatory Visit: Payer: Self-pay

## 2021-04-27 ENCOUNTER — Ambulatory Visit (INDEPENDENT_AMBULATORY_CARE_PROVIDER_SITE_OTHER): Payer: Medicaid Other | Admitting: Psychiatry

## 2021-04-27 DIAGNOSIS — F321 Major depressive disorder, single episode, moderate: Secondary | ICD-10-CM
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
## 2021-05-11 ENCOUNTER — Other Ambulatory Visit: Payer: Self-pay

## 2021-05-11 ENCOUNTER — Ambulatory Visit (INDEPENDENT_AMBULATORY_CARE_PROVIDER_SITE_OTHER): Payer: Medicaid Other | Admitting: Psychiatry

## 2021-05-11 DIAGNOSIS — F321 Major depressive disorder, single episode, moderate: Secondary | ICD-10-CM | POA: Diagnosis not present
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
## 2021-05-26 ENCOUNTER — Ambulatory Visit (HOSPITAL_COMMUNITY): Payer: Medicaid Other | Admitting: Psychiatry

## 2021-05-28 ENCOUNTER — Other Ambulatory Visit: Payer: Self-pay

## 2021-05-28 ENCOUNTER — Ambulatory Visit
Admission: EM | Admit: 2021-05-28 | Discharge: 2021-05-28 | Disposition: A | Discharge status: Home / Self Care | Payer: Medicaid Other | Attending: Emergency Medicine | Admitting: Emergency Medicine

## 2021-05-28 ENCOUNTER — Encounter: Payer: Self-pay | Admitting: Emergency Medicine

## 2021-05-28 DIAGNOSIS — Z20822 Contact with and (suspected) exposure to covid-19: Secondary | ICD-10-CM

## 2021-05-28 DIAGNOSIS — R6889 Other general symptoms and signs: Secondary | ICD-10-CM

## 2021-05-28 MED ORDER — BENZONATATE 100 MG PO CAPS: 100.0000 mg | ORAL_CAPSULE | Freq: Three times a day (TID) | ORAL | 0 refills | Status: DC

## 2021-05-28 MED ORDER — ONDANSETRON HCL 4 MG PO TABS: 4.0000 mg | ORAL_TABLET | Freq: Four times a day (QID) | ORAL | 0 refills | Status: DC

## 2021-05-28 NOTE — ED Provider Notes (Signed)
[facility]   [id number] 05/28/21 Arrival Time: 1855   CC: COVID symptoms  SUBJECTIVE: History from: patient.  Karen Shields is a 30 y.o. female who presents with fever, chills, headache, nausea, and dizziness that began this morning.  Husband tested positive for covid.  Has tried OTC medications without relief.  Denies aggravating factors.  Denies previous symptoms in the past.   Denies SOB, wheezing, chest pain, changes in bowel or bladder habits.    ROS: As per HPI.  All other pertinent ROS negative.     Past Medical History:  Diagnosis Date   Anxiety    Asthma    WELL CONTROLLED   Asthma    Depression    pt stated   Eczema 2008   Gallstones    GERD (gastroesophageal reflux disease)    Headache    Hx MRSA infection 09-2014   IBS (irritable bowel syndrome)    Ovarian cyst    Past Surgical History:  Procedure Laterality Date   CHOLECYSTECTOMY N/A 01/16/2016   Procedure: LAPAROSCOPIC CHOLECYSTECTOMY;  Surgeon: Lattie Haw, MD;  Location: [facility] ORS;  Service: General;  Laterality: N/A;   ESOPHAGOGASTRODUODENOSCOPY N/A 11/13/2015   ALP:FXTKWIO reflux    OVARIAN CYST REMOVAL     OVARIAN CYST REMOVAL  2011   TUBAL LIGATION     WISDOM TOOTH EXTRACTION     Allergies  Allergen Reactions   Latex Dermatitis   No current facility-administered medications on file prior to encounter.   Current Outpatient Medications on File Prior to Encounter  Medication Sig Dispense Refill   albuterol (VENTOLIN HFA) 108 (90 Base) MCG/ACT inhaler Inhale 2 puffs into the lungs every 6 (six) hours as needed for wheezing. 18 g 5   ALPRAZolam (XANAX) 1 MG tablet Take 1 tablet (1 mg total) by mouth daily as needed for anxiety. 30 tablet 2   cetirizine (ZYRTEC) 10 MG tablet Take 10 mg by mouth daily as needed.      Cholecalciferol (VITAMIN D3) 5000 units CAPS Take by mouth.     mupirocin nasal ointment (BACTROBAN) 2 % Apply over the rash twice daily for 1 week 1 g 0   omeprazole  (PRILOSEC) 20 MG capsule Take 1 capsule (20 mg total) by mouth daily. 30 capsule 0   vortioxetine HBr (TRINTELLIX) 20 MG TABS tablet Take 1 tablet (20 mg total) by mouth daily. 30 tablet 2   Social History   Socioeconomic History   Marital status: Legally Separated    Spouse name: Not on file   Number of children: 2   Years of education: Not on file   Highest education level: Not on file  Occupational History   Not on file  Tobacco Use   Smoking status: Every Day    Packs/day: 0.75    Years: 5.00    Pack years: 3.75    Types: Cigarettes    Start date: 02/11/2006   Smokeless tobacco: Never  Vaping Use   Vaping Use: Former  Substance and Sexual Activity   Alcohol use: Yes    Alcohol/week: 0.0 standard drinks    Comment: rarely, maybe once a year   Drug use: Not Currently    Types: Benzodiazepines, Opium   Sexual activity: Yes    Partners: Male    Birth control/protection: Surgical  Other Topics Concern   Not on file  Social History Narrative   ** Merged History Encounter **    Four daughters between herself and husband, two biological.  Social Determinants of Health   Financial Resource Strain: Not on file  Food Insecurity: Not on file  Transportation Needs: Not on file  Physical Activity: Not on file  Stress: Not on file  Social Connections: Not on file  Intimate Partner Violence: Not on file   Family History  Problem Relation Age of Onset   Diabetes Mother    Anxiety disorder Mother    Depression Mother    Depression Maternal Grandmother    Colon cancer Neg Hx    Crohn's disease Neg Hx    Celiac disease Neg Hx     OBJECTIVE:  Vitals:   05/28/21 1905  BP: 120/70  Pulse: (!) 135  Resp: 16  Temp: (!) 100.7 F (38.2 C)  TempSrc: Oral  SpO2: 95%     General appearance: alert; appears fatigued, but nontoxic; speaking in full sentences and tolerating own secretions HEENT: NCAT; Ears: EACs clear, TMs pearly gray; Eyes: PERRL.  EOM grossly intact.  Nose: nares patent without rhinorrhea, Throat: oropharynx clear, tonsils non erythematous or enlarged, uvula midline  Neck: supple without LAD Lungs: unlabored respirations, symmetrical air entry; cough: absent; no respiratory distress; CTAB Heart: tachycardia Skin: warm and dry Psychological: alert and cooperative; normal mood and affect  ASSESSMENT & PLAN:  1. Exposure to COVID-19 virus   2. Flu-like symptoms     Meds ordered this encounter  Medications   benzonatate (TESSALON) 100 MG capsule    Sig: Take 1 capsule (100 mg total) by mouth every 8 (eight) hours.    Dispense:  21 capsule    Refill:  0    Order Specific Question:   Supervising Provider    Answer:   Eustace Moore [[id number]]   ondansetron (ZOFRAN) 4 MG tablet    Sig: Take 1 tablet (4 mg total) by mouth every 6 (six) hours.    Dispense:  12 tablet    Refill:  0    Order Specific Question:   Supervising Provider    Answer:   Eustace Moore [[id number]]   COVID testing ordered.  It will take between 5-7 days for test results.  Someone will contact you regarding abnormal results.    In the meantime: You should remain isolated in your home for 5 days from symptom onset AND greater than 72 hours after symptoms resolution (absence of fever without the use of fever-reducing medication and improvement in respiratory symptoms), whichever is longer Get plenty of rest and push fluids Tessalon Perles prescribed for cough Zofran for nausea and or vomiting Use OTC zyrtec for nasal congestion, runny nose, and/or sore throat Use OTC flonase for nasal congestion and runny nose Use medications daily for symptom relief Use OTC medications like ibuprofen or tylenol as needed fever or pain Call or go to the ED if you have any new or worsening symptoms such as fever, worsening cough, shortness of breath, chest tightness, chest pain, turning blue, changes in mental status, etc...   Reviewed expectations re: course of current  medical issues. Questions answered. Outlined signs and symptoms indicating need for more acute intervention. Patient verbalized understanding. After Visit Summary given.          Rennis Harding, PA-C 05/28/21 1913

## 2021-05-28 NOTE — ED Triage Notes (Signed)
Fever, chills, and headache and dizziness since this morning.

## 2021-05-28 NOTE — Discharge Instructions (Signed)
COVID testing ordered.  It will take between 5-7 days for test results.  Someone will contact you regarding abnormal results.    In the meantime: You should remain isolated in your home for 5 days from symptom onset AND greater than 72 hours after symptoms resolution (absence of fever without the use of fever-reducing medication and improvement in respiratory symptoms), whichever is longer Get plenty of rest and push fluids Tessalon Perles prescribed for cough Zofran for nausea and/or vomiting Use OTC zyrtec for nasal congestion, runny nose, and/or sore throat Use OTC flonase for nasal congestion and runny nose Use medications daily for symptom relief Use OTC medications like ibuprofen or tylenol as needed fever or pain Call or go to the ED if you have any new or worsening symptoms such as fever, worsening cough, shortness of breath, chest tightness, chest pain, turning blue, changes in mental status, etc...  

## 2021-05-29 LAB — NOVEL CORONAVIRUS, NAA: SARS-CoV-2, NAA: DETECTED — AB

## 2021-05-29 LAB — SARS-COV-2, NAA 2 DAY TAT

## 2021-06-09 ENCOUNTER — Ambulatory Visit (INDEPENDENT_AMBULATORY_CARE_PROVIDER_SITE_OTHER): Payer: Medicaid Other | Admitting: Psychiatry

## 2021-06-09 ENCOUNTER — Other Ambulatory Visit: Payer: Self-pay

## 2021-06-09 DIAGNOSIS — F4310 Post-traumatic stress disorder, unspecified: Secondary | ICD-10-CM

## 2021-06-09 DIAGNOSIS — F321 Major depressive disorder, single episode, moderate: Secondary | ICD-10-CM
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
## 2021-06-13 ENCOUNTER — Other Ambulatory Visit (HOSPITAL_COMMUNITY): Payer: Self-pay | Admitting: Psychiatry

## 2021-06-13 DIAGNOSIS — F4310 Post-traumatic stress disorder, unspecified: Secondary | ICD-10-CM

## 2021-07-02 ENCOUNTER — Ambulatory Visit (INDEPENDENT_AMBULATORY_CARE_PROVIDER_SITE_OTHER): Payer: Medicaid Other | Admitting: Psychiatry

## 2021-07-02 ENCOUNTER — Other Ambulatory Visit: Payer: Self-pay

## 2021-07-02 DIAGNOSIS — F4310 Post-traumatic stress disorder, unspecified: Secondary | ICD-10-CM | POA: Diagnosis not present

## 2021-07-02 DIAGNOSIS — F321 Major depressive disorder, single episode, moderate: Secondary | ICD-10-CM
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
## 2021-07-13 ENCOUNTER — Encounter (HOSPITAL_COMMUNITY): Payer: Self-pay | Admitting: Psychiatry

## 2021-07-13 ENCOUNTER — Other Ambulatory Visit: Payer: Self-pay

## 2021-07-13 ENCOUNTER — Telehealth (INDEPENDENT_AMBULATORY_CARE_PROVIDER_SITE_OTHER): Payer: Medicaid Other | Admitting: Psychiatry

## 2021-07-13 DIAGNOSIS — F4310 Post-traumatic stress disorder, unspecified: Secondary | ICD-10-CM

## 2021-07-13 DIAGNOSIS — F321 Major depressive disorder, single episode, moderate: Secondary | ICD-10-CM

## 2021-07-13 MED ORDER — VORTIOXETINE HBR 20 MG PO TABS
20.0000 mg | ORAL_TABLET | Freq: Every day | ORAL | 2 refills | Status: DC
Start: 2021-07-13 — End: 2021-10-13

## 2021-07-13 MED ORDER — ALPRAZOLAM 1 MG PO TABS: 1.0000 mg | ORAL_TABLET | Freq: Every day | ORAL | 2 refills | Status: DC | PRN

## 2021-07-13 NOTE — Progress Notes (Signed)
Virtual Visit via Telephone Note  I connected with Karen Shields on 07/13/29 at  1:00 PM EDT by telephone and verified that I am speaking with the correct person using two identifiers.  Location: Patient: home Provider: home office   I discussed the limitations, risks, security and privacy concerns of performing an evaluation and management service by telephone and the availability of in person appointments. I also discussed with the patient that there may be a patient responsible charge related to this service. The patient expressed understanding and agreed to proceed.     I discussed the assessment and treatment plan with the patient. The patient was provided an opportunity to ask questions and all were answered. The patient agreed with the plan and demonstrated an understanding of the instructions.   The patient was advised to call back or seek an in-person evaluation if the symptoms worsen or if the condition fails to improve as anticipated.  I provided 14 minutes of non-face-to-face time during this encounter.   [name], MD  [facility] MD/PA/NP OP Progress Note  07/13/2021 1:19 PM Karen Shields  MRN:  [id number]  Chief Complaint:  Chief Complaint   Anxiety; Depression; Follow-up    RJJ:OACZ patient is a 30year-old separated white female who lives with her mother and her 2 daughters in [place].  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   The patient was referred by the behavioral health Hospital where she was hospitalized from 4/4 through 12/26/2017 for symptoms of depression anxiety and suicidal ideation  Patient returns for follow-up after 3 months.  She states overall she is doing pretty well.  She had COVID last month but it was a mild case.  One of her daughters is sick again today.  She states overall however her mood has been very good and upbeat.  She is not really seeing her husband that much romantically but they are coparenting their children and this seems  to work out better.  She states most the time she sleeps well although during her menstrual cycle she does not sleep very well but the Xanax and melatonin help.  She denies significant depression anxiety thoughts of self-harm or suicidal ideation. Visit Diagnosis:    ICD-10-CM   1. Current moderate episode of major depressive disorder without prior episode (HCC)  F32.1     2. PTSD (post-traumatic stress disorder)  F43.10 ALPRAZolam (XANAX) 1 MG tablet      Past Psychiatric History: Past outpatient treatment at [facility].  She had 1 hospitalization 3 years ago for depression with suicidal ideation  Past Medical History:  Past Medical History:  Diagnosis Date   Anxiety    Asthma    WELL CONTROLLED   Asthma    Depression    pt stated   Eczema 2008   Gallstones    GERD (gastroesophageal reflux disease)    Headache    Hx MRSA infection 09-2014   IBS (irritable bowel syndrome)    Ovarian cyst     Past Surgical History:  Procedure Laterality Date   CHOLECYSTECTOMY N/A 01/16/2016   Procedure: LAPAROSCOPIC CHOLECYSTECTOMY;  Surgeon: Lattie Haw, MD;  Location: [facility] ORS;  Service: General;  Laterality: N/A;   ESOPHAGOGASTRODUODENOSCOPY N/A 11/13/2015   YSA:YTKZSWF reflux    OVARIAN CYST REMOVAL     OVARIAN CYST REMOVAL  2011   TUBAL LIGATION     WISDOM TOOTH EXTRACTION      Family Psychiatric History: see below  Family History:  Family History  Problem Relation Age of Onset   Diabetes Mother    Anxiety disorder Mother    Depression Mother    Depression Maternal Grandmother    Colon cancer Neg Hx    Crohn's disease Neg Hx    Celiac disease Neg Hx     Social History:  Social History   Socioeconomic History   Marital status: Legally Separated    Spouse name: Not on file   Number of children: 2   Years of education: Not on file   Highest education level: Not on file  Occupational History   Not on file  Tobacco Use   Smoking status: Every Day    Packs/day: 0.75     Years: 5.00    Pack years: 3.75    Types: Cigarettes    Start date: 02/11/2006   Smokeless tobacco: Never  Vaping Use   Vaping Use: Former  Substance and Sexual Activity   Alcohol use: Yes    Alcohol/week: 0.0 standard drinks    Comment: rarely, maybe once a year   Drug use: Not Currently    Types: Benzodiazepines, Opium   Sexual activity: Yes    Partners: Male    Birth control/protection: Surgical  Other Topics Concern   Not on file  Social History Narrative   ** Merged History Encounter **    Four daughters between herself and husband, two Human resources officer.    Social Determinants of Health   Financial Resource Strain: Not on file  Food Insecurity: Not on file  Transportation Needs: Not on file  Physical Activity: Not on file  Stress: Not on file  Social Connections: Not on file    Allergies:  Allergies  Allergen Reactions   Latex Dermatitis    Metabolic Disorder Labs: No results found for: HGBA1C, MPG No results found for: PROLACTIN No results found for: CHOL, TRIG, HDL, CHOLHDL, VLDL, LDLCALC Lab Results  Component Value Date   TSH 1.780 04/23/2020   TSH 0.326 (L) 12/22/2017    Therapeutic Level Labs: No results found for: LITHIUM No results found for: VALPROATE No components found for:  CBMZ  Current Medications: Current Outpatient Medications  Medication Sig Dispense Refill   albuterol (VENTOLIN HFA) 108 (90 Base) MCG/ACT inhaler Inhale 2 puffs into the lungs every 6 (six) hours as needed for wheezing. 18 g 5   ALPRAZolam (XANAX) 1 MG tablet Take 1 tablet (1 mg total) by mouth daily as needed for anxiety. 30 tablet 2   benzonatate (TESSALON) 100 MG capsule Take 1 capsule (100 mg total) by mouth every 8 (eight) hours. 21 capsule 0   cetirizine (ZYRTEC) 10 MG tablet Take 10 mg by mouth daily as needed.      Cholecalciferol (VITAMIN D3) 5000 units CAPS Take by mouth.     mupirocin nasal ointment (BACTROBAN) 2 % Apply over the rash twice daily for 1 week 1 g 0    omeprazole (PRILOSEC) 20 MG capsule Take 1 capsule (20 mg total) by mouth daily. 30 capsule 0   ondansetron (ZOFRAN) 4 MG tablet Take 1 tablet (4 mg total) by mouth every 6 (six) hours. 12 tablet 0   vortioxetine HBr (TRINTELLIX) 20 MG TABS tablet Take 1 tablet (20 mg total) by mouth daily. 30 tablet 2   No current facility-administered medications for this visit.     Musculoskeletal: Strength & Muscle Tone: na Gait & Station: na Patient leans: N/A  Psychiatric Specialty Exam: Review of Systems  All other systems reviewed and are negative.  There were no vitals taken for this visit.There is no height or weight on file to calculate BMI.  General Appearance: NA  Eye Contact:  NA  Speech:  Clear and Coherent  Volume:  Normal  Mood:  Euthymic  Affect:  NA  Thought Process:  Goal Directed  Orientation:  Full (Time, Place, and Person)  Thought Content: WDL   Suicidal Thoughts:  No  Homicidal Thoughts:  No  Memory:  Immediate;   Good Recent;   Good Remote;   Good  Judgement:  Good  Insight:  Good  Psychomotor Activity:  Normal  Concentration:  Concentration: Good and Attention Span: Good  Recall:  Good  Fund of Knowledge: Good  Language: Good  Akathisia:  No  Handed:  Right  AIMS (if indicated): not done  Assets:  Communication Skills Desire for Improvement Physical Health Resilience Social Support Talents/Skills  ADL's:  Intact  Cognition: WNL  Sleep:  Good   Screenings: AIMS    Flowsheet Row [facility]  AIMS Total Score 0      AUDIT    Flowsheet Row [facility]  Alcohol Use Disorder Identification Test Final Score (AUDIT) 2      GAD-7    Flowsheet Row Counselor [facility]2021 [facility] Counselor from [facility]/27[facility]2021 [facility] Counselor from 10/16/2018 [facility] Counselor from 04/14/2018 [facility]  Total GAD-7 Score 10 12 6 9       PHQ2-9    Flowsheet Row Video Visit from 07/13/2021 [facility] Video Visit from 04/16/2021 in [facility] Counselor from 01/28/2021 [facility] Video Visit from 01/20/2021 in [facility] Counselor from 07/29/2020 [facility]  PHQ-2 Total Score 0 1 1 0 2  PHQ-9 Total Score -- -- -- -- 12      Flowsheet Row Video Visit from 07/13/2021 [facility] [facility] from 05/28/2021 in [facility] [place] Video Visit from 04/16/2021 [facility]  [facility] RISK CATEGORY No Risk No Risk No Risk        Assessment and Plan: Patient is a 30 year old female with a history of depression and anxiety.  She continues to do well on her current regimen.  She will continue Trintellix 20 mg daily for depression and Xanax 1 mg daily as needed for anxiety or sleep.  She will return to see me in 3 months   26, MD 07/13/2021, 1:19 PM

## 2021-07-20 ENCOUNTER — Ambulatory Visit (INDEPENDENT_AMBULATORY_CARE_PROVIDER_SITE_OTHER): Payer: Medicaid Other | Admitting: Psychiatry

## 2021-07-20 ENCOUNTER — Other Ambulatory Visit: Payer: Self-pay

## 2021-07-20 DIAGNOSIS — F321 Major depressive disorder, single episode, moderate: Secondary | ICD-10-CM

## 2021-07-20 DIAGNOSIS — F4310 Post-traumatic stress disorder, unspecified: Secondary | ICD-10-CM | POA: Diagnosis not present
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
## 2021-07-22 ENCOUNTER — Encounter: Payer: Self-pay | Admitting: Emergency Medicine

## 2021-07-22 ENCOUNTER — Ambulatory Visit
Admission: EM | Admit: 2021-07-22 | Discharge: 2021-07-22 | Disposition: A | Discharge status: Home / Self Care | Payer: Medicaid Other | Attending: Urgent Care | Admitting: Urgent Care

## 2021-07-22 ENCOUNTER — Other Ambulatory Visit: Payer: Self-pay

## 2021-07-22 DIAGNOSIS — R1030 Lower abdominal pain, unspecified: Secondary | ICD-10-CM

## 2021-07-22 DIAGNOSIS — N939 Abnormal uterine and vaginal bleeding, unspecified: Secondary | ICD-10-CM | POA: Diagnosis not present

## 2021-07-22 DIAGNOSIS — Z7251 High risk heterosexual behavior: Secondary | ICD-10-CM | POA: Insufficient documentation

## 2021-07-22 LAB — POCT URINALYSIS DIP (MANUAL ENTRY)
Bilirubin, UA: NEGATIVE
Glucose, UA: NEGATIVE mg/dL
Ketones, POC UA: NEGATIVE mg/dL
Leukocytes, UA: NEGATIVE
Nitrite, UA: NEGATIVE
Protein Ur, POC: NEGATIVE mg/dL
Spec Grav, UA: 1.025 (ref 1.010–1.025)
Urobilinogen, UA: 0.2 E.U./dL
pH, UA: 6 (ref 5.0–8.0)

## 2021-07-22 LAB — POCT URINE PREGNANCY: Preg Test, Ur: NEGATIVE

## 2021-07-22 NOTE — ED Triage Notes (Signed)
States she is having spotting since last night.  States last period was 1.5 weeks ago.  States she feels tired and has lower abd  pain.

## 2021-07-22 NOTE — ED Provider Notes (Signed)
[place]   MRN: [id number] DOB: 1991-05-29  Subjective:   Karen Shields is a 30 y.o. female presenting for 1.5 week history of persistent intermittent lower abdominal pain, cramping, light spotting.  LMP was 07/08/2021, is regular.  She felt like she had a UTI then as well but never got checked.  Has concerns about infidelity as her husband cheated on her this past August.  She was checked for STIs and was clear.  No fever, nausea, vomiting, vaginal discharge, dysuria, urinary frequency.  Her UTI symptoms from having her last cycle did resolve but would like to be checked for this.  Has a remote history of 1 bacterial vaginosis episode.  Has a history of ovarian cyst but no fibroids.  No other gynecologic history.  No current facility-administered medications for this encounter.  Current Outpatient Medications:    albuterol (VENTOLIN HFA) 108 (90 Base) MCG/ACT inhaler, Inhale 2 puffs into the lungs every 6 (six) hours as needed for wheezing., Disp: 18 g, Rfl: 5   ALPRAZolam (XANAX) 1 MG tablet, Take 1 tablet (1 mg total) by mouth daily as needed for anxiety., Disp: 30 tablet, Rfl: 2   benzonatate (TESSALON) 100 MG capsule, Take 1 capsule (100 mg total) by mouth every 8 (eight) hours., Disp: 21 capsule, Rfl: 0   cetirizine (ZYRTEC) 10 MG tablet, Take 10 mg by mouth daily as needed. , Disp: , Rfl:    Cholecalciferol (VITAMIN D3) 5000 units CAPS, Take by mouth., Disp: , Rfl:    mupirocin nasal ointment (BACTROBAN) 2 %, Apply over the rash twice daily for 1 week, Disp: 1 g, Rfl: 0   omeprazole (PRILOSEC) 20 MG capsule, Take 1 capsule (20 mg total) by mouth daily., Disp: 30 capsule, Rfl: 0   ondansetron (ZOFRAN) 4 MG tablet, Take 1 tablet (4 mg total) by mouth every 6 (six) hours., Disp: 12 tablet, Rfl: 0   vortioxetine HBr (TRINTELLIX) 20 MG TABS tablet, Take 1 tablet (20 mg total) by mouth daily., Disp: 30 tablet, Rfl: 2   Allergies  Allergen Reactions   Latex Dermatitis     Past Medical History:  Diagnosis Date   Anxiety    Asthma    WELL CONTROLLED   Asthma    Depression    pt stated   Eczema 2008   Gallstones    GERD (gastroesophageal reflux disease)    Headache    Hx MRSA infection 09-2014   IBS (irritable bowel syndrome)    Ovarian cyst      Past Surgical History:  Procedure Laterality Date   CHOLECYSTECTOMY N/A 01/16/2016   Procedure: LAPAROSCOPIC CHOLECYSTECTOMY;  Surgeon: Florene Glen, MD;  Location: [facility] ORS;  Service: General;  Laterality: N/A;   ESOPHAGOGASTRODUODENOSCOPY N/A 11/13/2015   [id number] reflux    OVARIAN CYST REMOVAL     OVARIAN CYST REMOVAL  2011   TUBAL LIGATION     WISDOM TOOTH EXTRACTION      Family History  Problem Relation Age of Onset   Diabetes Mother    Anxiety disorder Mother    Depression Mother    Depression Maternal Grandmother    Colon cancer Neg Hx    Crohn's disease Neg Hx    Celiac disease Neg Hx     Social History   Tobacco Use   Smoking status: Every Day    Packs/day: 0.75    Years: 5.00    Pack years: 3.75    Types: Cigarettes    Start date: 02/11/2006  Smokeless tobacco: Never  Vaping Use   Vaping Use: Former  Substance Use Topics   Alcohol use: Yes    Alcohol/week: 0.0 standard drinks    Comment: rarely, maybe once a year   Drug use: Not Currently    Types: Benzodiazepines, Opium    ROS   Objective:   Vitals: BP 132/79 (BP Location: Right Arm)   Pulse (!) 107   Temp 99.7 F (37.6 C) (Oral)   Resp 18   LMP 07/09/2021 (Exact Date)   SpO2 95%   Physical Exam Constitutional:      General: She is not in acute distress.    Appearance: Normal appearance. She is well-developed and normal weight. She is not ill-appearing, toxic-appearing or diaphoretic.  HENT:     Head: Normocephalic and atraumatic.     Right Ear: External ear normal.     Left Ear: External ear normal.     Nose: Nose normal.     Mouth/Throat:     Mouth: Mucous membranes are moist.      Pharynx: Oropharynx is clear.  Eyes:     General: No scleral icterus.    Extraocular Movements: Extraocular movements intact.     Pupils: Pupils are equal, round, and reactive to light.  Cardiovascular:     Rate and Rhythm: Normal rate and regular rhythm.     Heart sounds: Normal heart sounds. No murmur heard.   No friction rub. No gallop.  Pulmonary:     Effort: Pulmonary effort is normal. No respiratory distress.     Breath sounds: Normal breath sounds. No stridor. No wheezing, rhonchi or rales.  Abdominal:     General: Bowel sounds are normal. There is no distension.     Palpations: Abdomen is soft. There is no mass.     Tenderness: There is abdominal tenderness in the right lower quadrant and left lower quadrant. There is no right CVA tenderness, left CVA tenderness, guarding or rebound.  Skin:    General: Skin is warm and dry.     Coloration: Skin is not pale.     Findings: No rash.  Neurological:     General: No focal deficit present.     Mental Status: She is alert and oriented to person, place, and time.  Psychiatric:        Mood and Affect: Mood normal.        Behavior: Behavior normal.        Thought Content: Thought content normal.        Judgment: Judgment normal.    Results for orders placed or performed during the hospital encounter of 07/22/21 (from the past 24 hour(s))  POCT urine pregnancy     Status: None   Collection Time: 07/22/21 12:24 PM  Result Value Ref Range   Preg Test, Ur Negative Negative  POCT urinalysis dipstick     Status: Abnormal   Collection Time: 07/22/21 12:25 PM  Result Value Ref Range   Color, UA yellow yellow   Clarity, UA clear clear   Glucose, UA negative negative mg/dL   Bilirubin, UA negative negative   Ketones, POC UA negative negative mg/dL   Spec Grav, UA 1.025 1.010 - 1.025   Blood, UA small (A) negative   pH, UA 6.0 5.0 - 8.0   Protein Ur, POC negative negative mg/dL   Urobilinogen, UA 0.2 0.2 or 1.0 E.U./dL   Nitrite, UA  Negative Negative   Leukocytes, UA Negative Negative    Assessment and Plan :  PDMP not reviewed this encounter.  1. Lower abdominal pain   2. Vaginal spotting   3. Unprotected sex    Offered naproxen for abdominal pains but she refused this she states that this does not help her.  Recommended ibuprofen which she has at home and declined a prescription.  Labs pending, will treat as appropriate. Counseled patient on potential for adverse effects with medications prescribed/recommended today, ER and return-to-clinic precautions discussed, patient verbalized understanding.    Wallis Bamberg, PA-C 07/22/21 1245

## 2021-07-24 LAB — CERVICOVAGINAL ANCILLARY ONLY
Bacterial Vaginitis (gardnerella): NEGATIVE
Chlamydia: NEGATIVE
Comment: NEGATIVE
Comment: NEGATIVE
Comment: NEGATIVE
Comment: NORMAL
Neisseria Gonorrhea: NEGATIVE
Trichomonas: NEGATIVE

## 2021-08-12 ENCOUNTER — Ambulatory Visit: Admit: 2021-08-12 | Discharge status: Absent without leave | Payer: Medicaid Other

## 2021-08-12 DIAGNOSIS — J029 Acute pharyngitis, unspecified: Secondary | ICD-10-CM | POA: Diagnosis not present

## 2021-08-12 DIAGNOSIS — J209 Acute bronchitis, unspecified: Secondary | ICD-10-CM | POA: Diagnosis not present

## 2021-08-12 DIAGNOSIS — J069 Acute upper respiratory infection, unspecified: Secondary | ICD-10-CM | POA: Diagnosis not present

## 2021-09-28 NOTE — Telephone Encounter (Signed)
   Complete physical exam  Patient: Karen Shields   DOB: 07/10/1999   31 y.o. Female  MRN: [id number]  Subjective:    No chief complaint on file.   Karen Shields is a 31 y.o. female who presents today for a complete physical exam. She reports consuming a {diet types:17450} diet. {types:19826} She generally feels {DESC; WELL/FAIRLY WELL/POORLY:18703}. She reports sleeping {DESC; WELL/FAIRLY WELL/POORLY:18703}. She {does/does not:200015} have additional problems to discuss today.    Most recent fall risk assessment:    03/17/2022   10:42 AM  Fall Risk   Falls in the past year? 0  Number falls in past yr: 0  Injury with Fall? 0  Risk for fall due to : No Fall Risks  Follow up Falls evaluation completed     Most recent depression screenings:    03/17/2022   10:42 AM 02/05/2021   10:46 AM  PHQ 2/9 Scores  PHQ - 2 Score 0 0  PHQ- 9 Score 5     {VISON DENTAL STD PSA (Optional):27386}  {History (Optional):23778}  Patient Care Team: Jessup, Joy, NP as PCP - General (Nurse Practitioner)   Outpatient Medications Prior to Visit  Medication Sig   fluticasone (FLONASE) 50 MCG/ACT nasal spray Place 2 sprays into both nostrils in the morning and at bedtime. After 7 days, reduce to once daily.   norgestimate-ethinyl estradiol (SPRINTEC 28) 0.25-35 MG-MCG tablet Take 1 tablet by mouth daily.   Nystatin POWD Apply liberally to affected area 2 times per day   spironolactone (ALDACTONE) 100 MG tablet Take 1 tablet (100 mg total) by mouth daily.   No facility-administered medications prior to visit.    ROS        Objective:     There were no vitals taken for this visit. {Vitals History (Optional):23777}  Physical Exam   No results found for any visits on 04/22/22. {Show previous labs (optional):23779}    Assessment & Plan:    Routine Health Maintenance and Physical Exam  Immunization History  Administered Date(s) Administered   DTaP 09/23/1999, 11/19/1999,  01/28/2000, 10/13/2000, 04/28/2004   Hepatitis A 02/23/2008, 02/28/2009   Hepatitis B 07/11/1999, 08/18/1999, 01/28/2000   HiB (PRP-OMP) 09/23/1999, 11/19/1999, 01/28/2000, 10/13/2000   IPV 09/23/1999, 11/19/1999, 07/18/2000, 04/28/2004   Influenza,inj,Quad PF,6+ Mos 05/31/2014   Influenza-Unspecified 08/30/2012   MMR 07/18/2001, 04/28/2004   Meningococcal Polysaccharide 02/28/2012   Pneumococcal Conjugate-13 10/13/2000   Pneumococcal-Unspecified 01/28/2000, 04/12/2000   Tdap 02/28/2012   Varicella 07/18/2000, 02/23/2008    Health Maintenance  Topic Date Due   HIV Screening  Never done   Hepatitis C Screening  Never done   INFLUENZA VACCINE  04/20/2022   PAP-Cervical Cytology Screening  04/22/2022 (Originally 07/09/2020)   PAP SMEAR-Modifier  04/22/2022 (Originally 07/09/2020)   TETANUS/TDAP  04/22/2022 (Originally 02/27/2022)   HPV VACCINES  Discontinued   COVID-19 Vaccine  Discontinued    Discussed health benefits of physical activity, and encouraged her to engage in regular exercise appropriate for her age and condition.  Problem List Items Addressed This Visit   None Visit Diagnoses     Annual physical exam    -  Primary   Cervical cancer screening       Need for Tdap vaccination          No follow-ups on file.     Joy Jessup, NP   

## 2021-09-30 ENCOUNTER — Ambulatory Visit (HOSPITAL_COMMUNITY): Payer: Medicaid Other | Admitting: Psychiatry

## 2021-09-30 ENCOUNTER — Other Ambulatory Visit: Payer: Self-pay

## 2021-10-01 ENCOUNTER — Ambulatory Visit (INDEPENDENT_AMBULATORY_CARE_PROVIDER_SITE_OTHER): Payer: Medicaid Other | Admitting: Psychiatry

## 2021-10-01 ENCOUNTER — Telehealth (HOSPITAL_COMMUNITY): Payer: Self-pay | Admitting: *Deleted

## 2021-10-01 ENCOUNTER — Other Ambulatory Visit: Payer: Self-pay

## 2021-10-01 DIAGNOSIS — F4310 Post-traumatic stress disorder, unspecified: Secondary | ICD-10-CM | POA: Diagnosis not present

## 2021-10-01 DIAGNOSIS — F321 Major depressive disorder, single episode, moderate: Secondary | ICD-10-CM
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
## 2021-10-01 NOTE — Telephone Encounter (Signed)
Patient is stating that her manager needs a note stating that she was suppose to be seen yesterday but the appt was cancelled. Also her manager needs a letter stating that she had an appt today that's why she was late for work so she don't get in trouble.

## 2021-10-09 ENCOUNTER — Other Ambulatory Visit (HOSPITAL_COMMUNITY): Payer: Self-pay | Admitting: Psychiatry

## 2021-10-09 DIAGNOSIS — F4310 Post-traumatic stress disorder, unspecified: Secondary | ICD-10-CM

## 2021-10-12 NOTE — Telephone Encounter (Signed)
Call for appt

## 2021-10-13 ENCOUNTER — Encounter (HOSPITAL_COMMUNITY): Payer: Self-pay | Admitting: Psychiatry

## 2021-10-13 ENCOUNTER — Other Ambulatory Visit: Payer: Self-pay

## 2021-10-13 ENCOUNTER — Telehealth (INDEPENDENT_AMBULATORY_CARE_PROVIDER_SITE_OTHER): Payer: Medicaid Other | Admitting: Psychiatry

## 2021-10-13 DIAGNOSIS — F4310 Post-traumatic stress disorder, unspecified: Secondary | ICD-10-CM

## 2021-10-13 DIAGNOSIS — F321 Major depressive disorder, single episode, moderate: Secondary | ICD-10-CM

## 2021-10-13 MED ORDER — ALPRAZOLAM 1 MG PO TABS: 1.0000 mg | ORAL_TABLET | Freq: Every day | ORAL | 2 refills | Status: DC | PRN

## 2021-10-13 MED ORDER — VORTIOXETINE HBR 20 MG PO TABS: 20.0000 mg | ORAL_TABLET | Freq: Every day | ORAL | 2 refills | Status: DC

## 2021-10-13 NOTE — Progress Notes (Signed)
Virtual Visit via Telephone Note  I connected with Karen Shields on 10/13/21 at  8:40 AM EST by telephone and verified that I am speaking with the correct person using two identifiers.  Location: Patient: home Provider: office   I discussed the limitations, risks, security and privacy concerns of performing an evaluation and management service by telephone and the availability of in person appointments. I also discussed with the patient that there may be a patient responsible charge related to this service. The patient expressed understanding and agreed to proceed.       I discussed the assessment and treatment plan with the patient. The patient was provided an opportunity to ask questions and all were answered. The patient agreed with the plan and demonstrated an understanding of the instructions.   The patient was advised to call back or seek an in-person evaluation if the symptoms worsen or if the condition fails to improve as anticipated.  I provided 15 minutes of non-face-to-face time during this encounter.   Levonne Spiller, MD  [facility] MD/PA/NP OP Progress Note  10/13/2021 9:10 AM Karen Shields  MRN:  [id number]  Chief Complaint:  Chief Complaint   Depression; Anxiety; Follow-up    HPI: This patient is a 31year-old separated white female who lives with her mother and her 2 daughters in [place].  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   The patient was referred by the behavioral health Hospital where she was hospitalized from 4/4 through 12/26/2017 for symptoms of depression anxiety and suicidal ideation  The patient returns for follow-up after 3 months.  She states overall she is doing fairly well.  She has gotten a job again at a [employer].  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  She is trying to use her breathing techniques but sometimes has to take a Xanax.  She also mentions that during her menstrual period she feels very  tired and drained despite taking her vitamin D.  She also has more trouble sleeping but melatonin helps.  She is going to mention all this to her OB/GYN.  Currently she denies significant depression or anxiety.  She denies thoughts of self-harm.  She thinks her medications have been helpful. Visit Diagnosis:    ICD-10-CM   1. Current moderate episode of major depressive disorder without prior episode ([place])  F32.1     2. PTSD (post-traumatic stress disorder)  F43.10 ALPRAZolam (XANAX) 1 MG tablet      Past Psychiatric History: Past outpatient treatment at [facility].  She had 1 hospitalization 4 years ago for depression with suicidal ideation  Past Medical History:  Past Medical History:  Diagnosis Date   Anxiety    Asthma    WELL CONTROLLED   Asthma    Depression    pt stated   Eczema 2008   Gallstones    GERD (gastroesophageal reflux disease)    Headache    Hx MRSA infection 09-2014   IBS (irritable bowel syndrome)    Ovarian cyst     Past Surgical History:  Procedure Laterality Date   CHOLECYSTECTOMY N/A 01/16/2016   Procedure: LAPAROSCOPIC CHOLECYSTECTOMY;  Surgeon: Florene Glen, MD;  Location: [facility] ORS;  Service: General;  Laterality: N/A;   ESOPHAGOGASTRODUODENOSCOPY N/A 11/13/2015   [id number] reflux    OVARIAN CYST REMOVAL     OVARIAN CYST REMOVAL  2011   TUBAL LIGATION     WISDOM TOOTH EXTRACTION      Family Psychiatric  History: see below  Family History:  Family History  Problem Relation Age of Onset   Diabetes Mother    Anxiety disorder Mother    Depression Mother    Depression Maternal Grandmother    Colon cancer Neg Hx    Crohn's disease Neg Hx    Celiac disease Neg Hx     Social History:  Social History   Socioeconomic History   Marital status: Legally Separated    Spouse name: Not on file   Number of children: 2   Years of education: Not on file   Highest education level: Not on file  Occupational History   Not on file  Tobacco Use    Smoking status: Every Day    Packs/day: 0.75    Years: 5.00    Pack years: 3.75    Types: Cigarettes    Start date: 02/11/2006   Smokeless tobacco: Never  Vaping Use   Vaping Use: Former  Substance and Sexual Activity   Alcohol use: Yes    Alcohol/week: 0.0 standard drinks    Comment: rarely, maybe once a year   Drug use: Not Currently    Types: Benzodiazepines, Opium   Sexual activity: Yes    Partners: Male    Birth control/protection: Surgical  Other Topics Concern   Not on file  Social History Narrative   ** Merged History Encounter **    Four daughters between herself and husband, two Oncologist.    Social Determinants of Health   Financial Resource Strain: Not on file  Food Insecurity: Not on file  Transportation Needs: Not on file  Physical Activity: Not on file  Stress: Not on file  Social Connections: Not on file    Allergies:  Allergies  Allergen Reactions   Latex Dermatitis    Metabolic Disorder Labs: No results found for: HGBA1C, MPG No results found for: PROLACTIN No results found for: CHOL, TRIG, HDL, CHOLHDL, VLDL, LDLCALC Lab Results  Component Value Date   TSH 1.780 04/23/2020   TSH 0.326 (L) 12/22/2017    Therapeutic Level Labs: No results found for: LITHIUM No results found for: VALPROATE No components found for:  CBMZ  Current Medications: Current Outpatient Medications  Medication Sig Dispense Refill   albuterol (VENTOLIN HFA) 108 (90 Base) MCG/ACT inhaler Inhale 2 puffs into the lungs every 6 (six) hours as needed for wheezing. 18 g 5   ALPRAZolam (XANAX) 1 MG tablet Take 1 tablet (1 mg total) by mouth daily as needed for anxiety. 30 tablet 2   benzonatate (TESSALON) 100 MG capsule Take 1 capsule (100 mg total) by mouth every 8 (eight) hours. 21 capsule 0   cetirizine (ZYRTEC) 10 MG tablet Take 10 mg by mouth daily as needed.      Cholecalciferol (VITAMIN D3) 5000 units CAPS Take by mouth.     mupirocin nasal ointment (BACTROBAN) 2 %  Apply over the rash twice daily for 1 week 1 g 0   omeprazole (PRILOSEC) 20 MG capsule Take 1 capsule (20 mg total) by mouth daily. 30 capsule 0   ondansetron (ZOFRAN) 4 MG tablet Take 1 tablet (4 mg total) by mouth every 6 (six) hours. 12 tablet 0   vortioxetine HBr (TRINTELLIX) 20 MG TABS tablet Take 1 tablet (20 mg total) by mouth daily. 30 tablet 2   No current facility-administered medications for this visit.     Musculoskeletal: Strength & Muscle Tone: na Gait & Station: na Patient leans: N/A  Psychiatric Specialty Exam: Review  of Systems  Genitourinary:  Positive for menstrual problem.  All other systems reviewed and are negative.  There were no vitals taken for this visit.There is no height or weight on file to calculate BMI.  General Appearance: NA  Eye Contact:  na  Speech:  Clear and Coherent  Volume:  Normal  Mood:  Euthymic  Affect:  NA  Thought Process:  Goal Directed  Orientation:  Full (Time, Place, and Person)  Thought Content: Rumination   Suicidal Thoughts:  No  Homicidal Thoughts:  No  Memory:  Immediate;   Good Recent;   Good Remote;   Good  Judgement:  Good  Insight:  Good  Psychomotor Activity:  Normal  Concentration:  Concentration: Good and Attention Span: Good  Recall:  Good  Fund of Knowledge: Good  Language: Good  Akathisia:  No  Handed:  Right  AIMS (if indicated): not done  Assets:  Communication Skills Desire for Improvement Physical Health Resilience Social Support Talents/Skills  ADL's:  Intact  Cognition: WNL  Sleep:  Good   Screenings: AIMS    Flowsheet Row Admission (Discharged) from 12/22/2017 in [place] [facility]  AIMS Total Score 0      AUDIT    Flowsheet Row Admission (Discharged) from 12/22/2017 in [facility] [facility]  Alcohol Use Disorder Identification Test Final Score (AUDIT) 2      GAD-7    Flowsheet Row Counselor from 05/11/2021 in [place] Counselor from 04/27/2021 in [place] Counselor from 10/16/2018 in [place] from 04/14/2018 in [place] [facility]  Total GAD-7 Score 10 12 6 9       PHQ2-9    Flowsheet Row Video Visit from 10/13/2021 in [facility] [place] [facility] Video Visit from 07/13/2021 in [place] Video Visit from 04/16/2021 in [place] [facility] from 01/28/2021 in [place] [facility] Video Visit from 01/20/2021 in [facility] [facility]  PHQ-2 Total Score 0 0 1 1 0      Flowsheet Row Video Visit from 10/13/2021 in [place] [facility] from 07/22/2021 in [facility] Video Visit from 07/13/2021 in [place] No Risk No Risk No Risk        Assessment and Plan: This patient is a 31 year old female with a history of depression anxiety.  She continues to do well on her current regimen.  She will continue Trintellix 20 mg daily for depression and Xanax 1 mg daily as needed for anxiety or sleep.  She will return to see me in 64-months   Levonne Spiller, MD 10/13/2021, 9:10 AM

## 2021-10-15 ENCOUNTER — Other Ambulatory Visit: Payer: Self-pay

## 2021-10-15 ENCOUNTER — Ambulatory Visit (INDEPENDENT_AMBULATORY_CARE_PROVIDER_SITE_OTHER): Payer: Medicaid Other | Admitting: Psychiatry

## 2021-10-15 ENCOUNTER — Encounter (HOSPITAL_COMMUNITY): Payer: Self-pay

## 2021-10-15 DIAGNOSIS — F4310 Post-traumatic stress disorder, unspecified: Secondary | ICD-10-CM | POA: Diagnosis not present
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
## 2021-10-15 NOTE — Plan of Care (Signed)
Pt participated in development of plan 

## 2021-10-16 ENCOUNTER — Encounter (HOSPITAL_COMMUNITY): Payer: Self-pay

## 2021-10-29 ENCOUNTER — Ambulatory Visit (HOSPITAL_COMMUNITY): Payer: Medicaid Other | Admitting: Psychiatry

## 2021-10-29 ENCOUNTER — Other Ambulatory Visit: Payer: Self-pay

## 2021-11-03 ENCOUNTER — Other Ambulatory Visit: Payer: Self-pay

## 2021-11-03 ENCOUNTER — Ambulatory Visit
Admission: EM | Admit: 2021-11-03 | Discharge: 2021-11-03 | Disposition: A | Discharge status: Home / Self Care | Payer: Medicaid Other | Attending: Student | Admitting: Student

## 2021-11-03 DIAGNOSIS — Z9851 Tubal ligation status: Secondary | ICD-10-CM | POA: Diagnosis not present

## 2021-11-03 DIAGNOSIS — R110 Nausea: Secondary | ICD-10-CM

## 2021-11-03 MED ORDER — ONDANSETRON 8 MG PO TBDP: 8.0000 mg | ORAL_TABLET | Freq: Three times a day (TID) | ORAL | 0 refills | Status: DC | PRN

## 2021-11-03 NOTE — ED Provider Notes (Signed)
[facility]    CSN: [id number] Arrival date & time: 11/03/21  1650      History   Chief Complaint Chief Complaint  Patient presents with   Nausea   Fatigue    HPI Karen Shields is a 31 y.o. female presenting with nausea, fatigue, headaches, malaise. Medical history asthma ,currently controlled on current regimen. History cholecysectomy. Nausea without vomiting or diarrhea. Requires work note. OTC medications providing little relief.    HPI  Past Medical History:  Diagnosis Date   Anxiety    Asthma    WELL CONTROLLED   Asthma    Depression    pt stated   Eczema 2008   Gallstones    GERD (gastroesophageal reflux disease)    Headache    Hx MRSA infection 09-2014   IBS (irritable bowel syndrome)    Ovarian cyst     Patient Active Problem List   Diagnosis Date Noted   MDD (major depressive disorder), recurrent episode, severe ([place]) 12/22/2017   Gallstone    RUQ pain 12/25/2015   Gallstones    Reflux esophagitis    Nausea without vomiting 11/04/2015   Cholelithiases 11/04/2015   GERD (gastroesophageal reflux disease) 11/04/2015   Early satiety 11/04/2015   MRSA (methicillin resistant staph aureus) culture positive 12/11/2014   Menstrual migraine without status migrainosus, not intractable 07/10/2014   Anxiety 06/14/2013   Maternal substance abuse ([place]) 04/14/2013   H/O cold sores 04/12/2013   Ovarian cyst, left 02/09/2011    Past Surgical History:  Procedure Laterality Date   CHOLECYSTECTOMY N/A 01/16/2016   Procedure: LAPAROSCOPIC CHOLECYSTECTOMY;  Surgeon: Florene Glen, MD;  Location: [facility] ORS;  Service: General;  Laterality: N/A;   ESOPHAGOGASTRODUODENOSCOPY N/A 11/13/2015   [id number] reflux    OVARIAN CYST REMOVAL     OVARIAN CYST REMOVAL  2011   TUBAL LIGATION     WISDOM TOOTH EXTRACTION      OB History     Gravida  2   Para  2   Term  1   Preterm  0   AB  0   Living  2      SAB  0   IAB  0   Ectopic  0    Multiple  0   Live Births  1            Home Medications    Prior to Admission medications   Medication Sig Start Date End Date Taking? Authorizing Provider  ondansetron (ZOFRAN-ODT) 8 MG disintegrating tablet Take 1 tablet (8 mg total) by mouth every 8 (eight) hours as needed for nausea or vomiting. 11/03/21  Yes Hazel Sams, PA-C  albuterol (VENTOLIN HFA) 108 (90 Base) MCG/ACT inhaler Inhale 2 puffs into the lungs every 6 (six) hours as needed for wheezing. 06/18/20   Varney Biles, MD  ALPRAZolam Duanne Moron) 1 MG tablet Take 1 tablet (1 mg total) by mouth daily as needed for anxiety. 10/13/21   Cloria Spring, MD  benzonatate (TESSALON) 100 MG capsule Take 1 capsule (100 mg total) by mouth every 8 (eight) hours. 05/28/21   Wurst, [place], PA-C  cetirizine (ZYRTEC) 10 MG tablet Take 10 mg by mouth daily as needed.     [provider]  Cholecalciferol (VITAMIN D3) 5000 units CAPS Take by mouth.    [provider]  mupirocin nasal ointment (BACTROBAN) 2 % Apply over the rash twice daily for 1 week 11/22/20   Couture, Cortni S, PA-C  omeprazole (  PRILOSEC) 20 MG capsule Take 1 capsule (20 mg total) by mouth daily. 07/29/20   Palumbo, April, MD  vortioxetine HBr (TRINTELLIX) 20 MG TABS tablet Take 1 tablet (20 mg total) by mouth daily. 10/13/21   Cloria Spring, MD    Family History Family History  Problem Relation Age of Onset   Diabetes Mother    Anxiety disorder Mother    Depression Mother    Depression Maternal Grandmother    Colon cancer Neg Hx    Crohn's disease Neg Hx    Celiac disease Neg Hx     Social History Social History   Tobacco Use   Smoking status: Every Day    Packs/day: 0.75    Years: 5.00    Pack years: 3.75    Types: Cigarettes    Start date: 02/11/2006   Smokeless tobacco: Never  Vaping Use   Vaping Use: Former  Substance Use Topics   Alcohol use: Yes    Alcohol/week: 0.0 standard drinks    Comment: rarely, maybe once a year   Drug  use: Not Currently    Types: Benzodiazepines, Opium     Allergies   Latex   Review of Systems Review of Systems  Constitutional:  Positive for appetite change and fatigue. Negative for chills and fever.  HENT:  Positive for congestion. Negative for ear pain, rhinorrhea, sinus pressure, sinus pain and sore throat.   Eyes:  Negative for redness and visual disturbance.  Respiratory:  Negative for cough, chest tightness, shortness of breath and wheezing.   Cardiovascular:  Negative for chest pain and palpitations.  Gastrointestinal:  Positive for nausea. Negative for abdominal pain, constipation, diarrhea and vomiting.  Genitourinary:  Negative for dysuria, frequency and urgency.  Musculoskeletal:  Negative for myalgias.  Neurological:  Negative for dizziness, weakness and headaches.  Psychiatric/Behavioral:  Negative for confusion.   All other systems reviewed and are negative.   Physical Exam Triage Vital Signs ED Triage Vitals [11/03/21 1729]  Enc Vitals Group     BP 121/76     Pulse Rate 96     Resp 18     Temp 98 F (36.7 C)     Temp src      SpO2 96 %     Weight      Height      Head Circumference      Peak Flow      Pain Score      Pain Loc      Pain Edu?      Excl. in [place]?    No data found.  Updated Vital Signs BP 121/76    Pulse 96    Temp 98 F (36.7 C)    Resp 18    LMP 10/07/2021 (Approximate)    SpO2 96%   Visual Acuity Right Eye Distance:   Left Eye Distance:   Bilateral Distance:    Right Eye Near:   Left Eye Near:    Bilateral Near:     Physical Exam Vitals reviewed.  Constitutional:      General: She is not in acute distress.    Appearance: Normal appearance. She is not ill-appearing.  HENT:     Head: Normocephalic and atraumatic.     Mouth/Throat:     Mouth: Mucous membranes are moist.     Pharynx: No pharyngeal swelling, oropharyngeal exudate, posterior oropharyngeal erythema or uvula swelling.     Tonsils: No tonsillar exudate.      Comments: Moist  mucous membranes Eyes:     Extraocular Movements: Extraocular movements intact.     Pupils: Pupils are equal, round, and reactive to light.  Cardiovascular:     Rate and Rhythm: Normal rate and regular rhythm.     Heart sounds: Normal heart sounds.  Pulmonary:     Effort: Pulmonary effort is normal.     Breath sounds: Normal breath sounds. No wheezing, rhonchi or rales.  Abdominal:     General: Bowel sounds are normal. There is no distension.     Palpations: Abdomen is soft. There is no mass.     Tenderness: There is generalized abdominal tenderness. There is no right CVA tenderness, left CVA tenderness, guarding or rebound.     Comments: Generalized TTP without focal component  Skin:    General: Skin is warm.     Capillary Refill: Capillary refill takes less than 2 seconds.     Comments: Good skin turgor  Neurological:     General: No focal deficit present.     Mental Status: She is alert and oriented to person, place, and time.  Psychiatric:        Mood and Affect: Mood normal.        Behavior: Behavior normal.     UC Treatments / Results  Labs (all labs ordered are listed, but only abnormal results are displayed) Labs Reviewed - No data to display  EKG   Radiology No results found.  Procedures Procedures (including critical care time)  Medications Ordered in UC Medications - No data to display  Initial Impression / Assessment and Plan / UC Course  I have reviewed the triage vital signs and the nursing notes.  Pertinent labs & imaging results that were available during my care of the patient were reviewed by me and considered in my medical decision making (see chart for details).     This patient is a very pleasant 31 y.o. year old female presenting with viral syndrome. Afebrile, nontachy. History tubal ligation.  Zofran ODT sent. Good hydration.   Work note provided.    Final Clinical Impressions(s) / UC Diagnoses   Final diagnoses:   Nausea without vomiting  History of tubal ligation     Discharge Instructions      -Take the Zofran (ondansetron) up to 3 times daily for nausea and vomiting. Dissolve one pill under your tongue or between your teeth and your cheek. -Drink plenty of fluids     ED Prescriptions     Medication Sig Dispense Auth. Provider   ondansetron (ZOFRAN-ODT) 8 MG disintegrating tablet Take 1 tablet (8 mg total) by mouth every 8 (eight) hours as needed for nausea or vomiting. 20 tablet Hazel Sams, PA-C      PDMP not reviewed this encounter.   Hazel Sams, PA-C 11/03/21 1804

## 2021-11-03 NOTE — ED Triage Notes (Signed)
Pt states she has had nausea and mild headache and needs work note

## 2021-11-03 NOTE — Discharge Instructions (Addendum)
-  Take the Zofran (ondansetron) up to 3 times daily for nausea and vomiting. Dissolve one pill under your tongue or between your teeth and your cheek. -Drink plenty of fluids 

## 2021-11-07 ENCOUNTER — Other Ambulatory Visit: Payer: Self-pay

## 2021-11-07 ENCOUNTER — Encounter (HOSPITAL_COMMUNITY): Payer: Self-pay | Admitting: Emergency Medicine

## 2021-11-07 ENCOUNTER — Emergency Department (HOSPITAL_COMMUNITY)
Admission: EM | Admit: 2021-11-07 | Discharge: 2021-11-07 | Disposition: A | Discharge status: Home / Self Care | Payer: Medicaid Other | Attending: Emergency Medicine | Admitting: Emergency Medicine

## 2021-11-07 DIAGNOSIS — Z91040 Latex allergy status: Secondary | ICD-10-CM | POA: Diagnosis not present

## 2021-11-07 DIAGNOSIS — J45909 Unspecified asthma, uncomplicated: Secondary | ICD-10-CM | POA: Diagnosis not present

## 2021-11-07 DIAGNOSIS — Y900 Blood alcohol level of less than 20 mg/100 ml: Secondary | ICD-10-CM | POA: Diagnosis not present

## 2021-11-07 DIAGNOSIS — F10929 Alcohol use, unspecified with intoxication, unspecified: Secondary | ICD-10-CM | POA: Diagnosis present

## 2021-11-07 DIAGNOSIS — R000 Tachycardia, unspecified: Secondary | ICD-10-CM | POA: Diagnosis not present

## 2021-11-07 DIAGNOSIS — R1111 Vomiting without nausea: Secondary | ICD-10-CM | POA: Diagnosis not present

## 2021-11-07 DIAGNOSIS — F10920 Alcohol use, unspecified with intoxication, uncomplicated: Secondary | ICD-10-CM

## 2021-11-07 DIAGNOSIS — R4020 Unspecified coma: Secondary | ICD-10-CM | POA: Diagnosis not present

## 2021-11-07 DIAGNOSIS — R404 Transient alteration of awareness: Secondary | ICD-10-CM | POA: Diagnosis not present

## 2021-11-07 DIAGNOSIS — E876 Hypokalemia: Secondary | ICD-10-CM | POA: Insufficient documentation

## 2021-11-07 DIAGNOSIS — R112 Nausea with vomiting, unspecified: Secondary | ICD-10-CM

## 2021-11-07 LAB — URINALYSIS, ROUTINE W REFLEX MICROSCOPIC
Bilirubin Urine: NEGATIVE
Glucose, UA: NEGATIVE mg/dL
Hgb urine dipstick: NEGATIVE
Ketones, ur: NEGATIVE mg/dL
Leukocytes,Ua: NEGATIVE
Nitrite: NEGATIVE
Protein, ur: NEGATIVE mg/dL
Specific Gravity, Urine: 1.01 (ref 1.005–1.030)
pH: 7 (ref 5.0–8.0)

## 2021-11-07 LAB — COMPREHENSIVE METABOLIC PANEL
ALT: 15 U/L (ref 0–44)
AST: 19 U/L (ref 15–41)
Albumin: 3.9 g/dL (ref 3.5–5.0)
Alkaline Phosphatase: 86 U/L (ref 38–126)
Anion gap: 11 (ref 5–15)
BUN: 9 mg/dL (ref 6–20)
CO2: 19 mmol/L — ABNORMAL LOW (ref 22–32)
Calcium: 8.7 mg/dL — ABNORMAL LOW (ref 8.9–10.3)
Chloride: 114 mmol/L — ABNORMAL HIGH (ref 98–111)
Creatinine, Ser: 0.76 mg/dL (ref 0.44–1.00)
GFR, Estimated: >60 mL/min (ref >60)
Glucose, Bld: 102 mg/dL — ABNORMAL HIGH (ref 70–99)
Potassium: 3.1 mmol/L — ABNORMAL LOW (ref 3.5–5.1)
Sodium: 144 mmol/L (ref 135–145)
Total Bilirubin: 0.1 mg/dL — ABNORMAL LOW (ref 0.3–1.2)
Total Protein: 6.8 g/dL (ref 6.5–8.1)

## 2021-11-07 LAB — CBC WITH DIFFERENTIAL/PLATELET
Abs Immature Granulocytes: 0.04 10*3/uL (ref 0.00–0.07)
Basophils Absolute: 0.1 10*3/uL (ref 0.0–0.1)
Basophils Relative: 1 %
Eosinophils Absolute: 0.3 10*3/uL (ref 0.0–0.5)
Eosinophils Relative: 2 %
HCT: 43.1 % (ref 36.0–46.0)
Hemoglobin: 14.1 g/dL (ref 12.0–15.0)
Immature Granulocytes: 0 %
Lymphocytes Relative: 39 %
Lymphs Abs: 4.2 10*3/uL — ABNORMAL HIGH (ref 0.7–4.0)
MCH: 29.5 pg (ref 26.0–34.0)
MCHC: 32.7 g/dL (ref 30.0–36.0)
MCV: 90.2 fL (ref 80.0–100.0)
Monocytes Absolute: 1 10*3/uL (ref 0.1–1.0)
Monocytes Relative: 9 %
Neutro Abs: 5.2 10*3/uL (ref 1.7–7.7)
Neutrophils Relative %: 49 %
Platelets: 364 10*3/uL (ref 150–400)
RBC: 4.78 MIL/uL (ref 3.87–5.11)
RDW: 13 % (ref 11.5–15.5)
WBC: 10.7 10*3/uL — ABNORMAL HIGH (ref 4.0–10.5)
nRBC: 0 % (ref 0.0–0.2)

## 2021-11-07 LAB — RAPID URINE DRUG SCREEN, HOSP PERFORMED
Amphetamines: NOT DETECTED
Barbiturates: NOT DETECTED
Benzodiazepines: POSITIVE — AB
Cocaine: NOT DETECTED
Opiates: NOT DETECTED
Tetrahydrocannabinol: NOT DETECTED

## 2021-11-07 LAB — SALICYLATE LEVEL: Salicylate Lvl: <7 mg/dL — ABNORMAL LOW (ref 7.0–30.0)

## 2021-11-07 LAB — POC URINE PREG, ED: Preg Test, Ur: NEGATIVE

## 2021-11-07 LAB — ACETAMINOPHEN LEVEL: Acetaminophen (Tylenol), Serum: <10 ug/mL — ABNORMAL LOW (ref 10–30)

## 2021-11-07 LAB — ETHANOL: Alcohol, Ethyl (B): 259 mg/dL — ABNORMAL HIGH (ref <10)

## 2021-11-07 MED ORDER — ONDANSETRON HCL 4 MG/2ML IJ SOLN
4.0000 mg | Freq: Once | INTRAMUSCULAR | Status: AC
  Administered 2021-11-07: 4 mg via INTRAVENOUS
  Filled 2021-11-07: qty 2

## 2021-11-07 MED ORDER — POTASSIUM CHLORIDE CRYS ER 20 MEQ PO TBCR: 20.0000 meq | EXTENDED_RELEASE_TABLET | Freq: Two times a day (BID) | ORAL | 0 refills | Status: DC

## 2021-11-07 MED ORDER — LACTATED RINGERS IV BOLUS
1000.0000 mL | Freq: Once | INTRAVENOUS | Status: AC
Start: 2021-11-07 — End: 2021-11-07
  Administered 2021-11-07: 1000 mL via INTRAVENOUS

## 2021-11-07 MED ORDER — POTASSIUM CHLORIDE CRYS ER 20 MEQ PO TBCR
40.0000 meq | EXTENDED_RELEASE_TABLET | Freq: Once | ORAL | Status: AC
  Administered 2021-11-07: 40 meq via ORAL
  Filled 2021-11-07: qty 2
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
## 2021-11-07 NOTE — ED Notes (Signed)
Pt was placed on bedpan, threw bedpan in floor- pt urinated all over bed- pt cleaned up, bed linen changed, new gown put on pt. Warm blanket given. Lights dimmed for comfort- call bell within pt reach.

## 2021-11-07 NOTE — ED Notes (Signed)
Pt tolerating oral fluids at this time and requesting food. Pt A/O to place and self. This nurse explained situation to patient and reasoning for ER visit.

## 2021-11-07 NOTE — Discharge Instructions (Addendum)
It was a pleasure caring for you today in the emergency department.  Please refrain from alcohol abuse in the future  Follow-up with your PCP in 3 days for repeat potassium level  Please return to the emergency department for any worsening or worrisome symptoms.
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
## 2021-11-07 NOTE — ED Provider Notes (Signed)
°  Provider Note MRN:  [id number]  Arrival date & time: 11/07/21    ED Course and Medical Decision Making  Assumed care from Dr Preston Fleeting at shift change.  See not from prior team for complete details, in brief: etoh abuse, not suicidal, recreational.   Plan per prior physician observe until clinically sober  Patient reassessed, reports she is feeling better.  She is ambulatory with a steady gait.  She is tolerating p.o. intake without difficulty.  Reports that she is feeling better.  Hemodynamically stable.  Labs reviewed, mild hypokalemia.  P.o. replacement.  Patient given resources regarding outpatient counseling, substance abuse  Patient is awake, alert, oriented x 3, ambulating at time of discharge, and with a normal gait. The patient is able to carry a normal conversation; there is no evidence of delusions, hallucinations, suicidal, or homicidal ideation. Discussed with pt need for alcohol cessation. Information given regarding ETOH rehab. Pt clear on s/s that warrant a return to the ED.  The patient improved significantly and was discharged in stable condition. Detailed discussions were had with the patient regarding current findings, and need for close f/u with PCP or on call doctor. The patient has been instructed to return immediately if the symptoms worsen in any way for re-evaluation. Patient verbalized understanding and is in agreement with current care plan. All questions answered prior to discharge.    Counseled patient for approximately 3 minutes regarding smoking cessation. Discussed risks of smoking and how they applied and affected their visit here today. Patient not ready to quit at this time, however will follow up with their primary doctor when they are.   CPT code: 52080: intermediate counseling for smoking cessation    Procedures  Final Clinical Impressions(s) / ED Diagnoses     ICD-10-CM   1. Alcohol intoxication, uncomplicated (HCC)  F10.920     2. Nausea and  vomiting, unspecified vomiting type  R11.2     3. Hypokalemia  E87.6       ED Discharge Orders          Ordered    potassium chloride SA (KLOR-CON M) 20 MEQ tablet  2 times daily        11/07/21 0732              Discharge Instructions      It was a pleasure caring for you today in the emergency department.  Please refrain from alcohol abuse in the future  Follow-up with your PCP in 3 days for repeat potassium level  Please return to the emergency department for any worsening or worrisome symptoms.          Sloan Leiter, DO 11/07/21 [phone]

## 2021-11-09 ENCOUNTER — Telehealth: Payer: Self-pay

## 2021-11-09 NOTE — Telephone Encounter (Signed)
Transition Care Management Unsuccessful Follow-up Telephone Call  Date of discharge and from where:  11/06/2021 from [facility]  Attempts:  1st Attempt  Reason for unsuccessful TCM follow-up call:  Unable to leave message

## 2021-11-10 NOTE — Telephone Encounter (Signed)
Transition Care Management Unsuccessful Follow-up Telephone Call  Date of discharge and from where:  11/06/2021 from [facility]  Attempts:  2nd Attempt  Reason for unsuccessful TCM follow-up call:  Unable to leave message

## 2021-11-12 ENCOUNTER — Other Ambulatory Visit: Payer: Self-pay

## 2021-11-12 ENCOUNTER — Ambulatory Visit (INDEPENDENT_AMBULATORY_CARE_PROVIDER_SITE_OTHER): Payer: Medicaid Other | Admitting: Psychiatry

## 2021-11-12 DIAGNOSIS — F321 Major depressive disorder, single episode, moderate: Secondary | ICD-10-CM | POA: Diagnosis not present

## 2021-11-12 DIAGNOSIS — F4310 Post-traumatic stress disorder, unspecified: Secondary | ICD-10-CM | POA: Diagnosis not present

## 2021-11-12 NOTE — Telephone Encounter (Signed)
Transition Care Management Follow-up Telephone Call Date of discharge and from where: 11/08/2021 from [facility] How have you been since you were released from the hospital? Patient stated that she is feeling better. Patient stated that she is taking the potassium that was rx'ed from the ED.  Any questions or concerns? No  Items Reviewed: Did the pt receive and understand the discharge instructions provided? Yes  Medications obtained and verified? Yes  Other? No  Any new allergies since your discharge? No  Dietary orders reviewed? No Do you have support at home? Yes   Functional Questionnaire: (I = Independent and D = Dependent) ADLs: I  Bathing/Dressing- I  Meal Prep- I  Eating- I  Maintaining continence- I  Transferring/Ambulation- I  Managing Meds- I   Follow up appointments reviewed:  PCP Hospital f/u appt confirmed? No  Patient encouraged to est with PCP.  Specialist Hospital f/u appt confirmed? Yes  Scheduled to see BH.  Are transportation arrangements needed? No  If their condition worsens, is the pt aware to call PCP or go to the Emergency Dept.? Yes Was the patient provided with contact information for the PCP's office or ED? Yes Was to pt encouraged to call back with questions or concerns? Yes
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
## 2021-12-04 ENCOUNTER — Ambulatory Visit
Admission: EM | Admit: 2021-12-04 | Discharge: 2021-12-04 | Disposition: A | Discharge status: Home / Self Care | Payer: Medicaid Other

## 2021-12-04 ENCOUNTER — Other Ambulatory Visit: Payer: Self-pay

## 2021-12-04 DIAGNOSIS — H539 Unspecified visual disturbance: Secondary | ICD-10-CM | POA: Diagnosis not present

## 2021-12-04 DIAGNOSIS — H538 Other visual disturbances: Secondary | ICD-10-CM

## 2021-12-04 NOTE — ED Triage Notes (Signed)
Pt presents with c/o sudden loss of vision in left eye earlier today that has resolved , denies pain  ?

## 2021-12-04 NOTE — ED Provider Notes (Signed)
?[facility] ? ? ?MRN: [id number] DOB: [birth year] ? ?Subjective:  ? ?Karen Shields is a 31 y.o. female presenting for transient monocular blurred vision/loss of the left that lasted 30 minutes earlier today.  Symptoms returned without any interventions.  Denies headache, confusion, blackout of her vision, tunnel vision, red or painful eye, weakness, numbness or tingling.  She is concerned about a stroke as her grandfather had this.  Patient does have a history of drug use but denies current use.  Reports that she had a similar event that lasted much longer in January.  Primary diagnosis was alcohol intoxication. ? ?No current facility-administered medications for this encounter. ? ?Current Outpatient Medications:  ?  albuterol (VENTOLIN HFA) 108 (90 Base) MCG/ACT inhaler, Inhale 2 puffs into the lungs every 6 (six) hours as needed for wheezing., Disp: 18 g, Rfl: 5 ?  ALPRAZolam (XANAX) 1 MG tablet, Take 1 tablet (1 mg total) by mouth daily as needed for anxiety., Disp: 30 tablet, Rfl: 2 ?  benzonatate (TESSALON) 100 MG capsule, Take 1 capsule (100 mg total) by mouth every 8 (eight) hours., Disp: 21 capsule, Rfl: 0 ?  cetirizine (ZYRTEC) 10 MG tablet, Take 10 mg by mouth daily as needed. , Disp: , Rfl:  ?  Cholecalciferol (VITAMIN D3) 5000 units CAPS, Take by mouth., Disp: , Rfl:  ?  mupirocin nasal ointment (BACTROBAN) 2 %, Apply over the rash twice daily for 1 week, Disp: 1 g, Rfl: 0 ?  omeprazole (PRILOSEC) 20 MG capsule, Take 1 capsule (20 mg total) by mouth daily., Disp: 30 capsule, Rfl: 0 ?  ondansetron (ZOFRAN-ODT) 8 MG disintegrating tablet, Take 1 tablet (8 mg total) by mouth every 8 (eight) hours as needed for nausea or vomiting., Disp: 20 tablet, Rfl: 0 ?  potassium chloride SA (KLOR-CON M) 20 MEQ tablet, Take 1 tablet (20 mEq total) by mouth 2 (two) times daily., Disp: 20 tablet, Rfl: 0 ?  vortioxetine HBr (TRINTELLIX) 20 MG TABS tablet, Take 1 tablet (20 mg total) by mouth daily.,  Disp: 30 tablet, Rfl: 2  ? ?Allergies  ?Allergen Reactions  ? Latex Dermatitis  ? ? ?Past Medical History:  ?Diagnosis Date  ? Anxiety   ? Asthma   ? WELL CONTROLLED  ? Asthma   ? Depression   ? pt stated  ? Eczema 2008  ? Gallstones   ? GERD (gastroesophageal reflux disease)   ? Headache   ? Hx MRSA infection 09-2014  ? IBS (irritable bowel syndrome)   ? Ovarian cyst   ?  ? ?Past Surgical History:  ?Procedure Laterality Date  ? CHOLECYSTECTOMY N/A 01/16/2016  ? Procedure: LAPAROSCOPIC CHOLECYSTECTOMY;  Surgeon: Lattie Haw, MD;  Location: [facility] ORS;  Service: General;  Laterality: N/A;  ? ESOPHAGOGASTRODUODENOSCOPY N/A 11/13/2015  ? QIO:NGEXBMW reflux   ? OVARIAN CYST REMOVAL    ? OVARIAN CYST REMOVAL  2011  ? TUBAL LIGATION    ? WISDOM TOOTH EXTRACTION    ? ? ?Family History  ?Problem Relation Age of Onset  ? Diabetes Mother   ? Anxiety disorder Mother   ? Depression Mother   ? Depression Maternal Grandmother   ? Colon cancer Neg Hx   ? Crohn's disease Neg Hx   ? Celiac disease Neg Hx   ? ? ?Social History  ? ?Tobacco Use  ? Smoking status: Every Day  ?  Packs/day: 0.75  ?  Years: 5.00  ?  Pack years: 3.75  ?  Types: Cigarettes  ?  Start date: 02/11/2006  ? Smokeless tobacco: Never  ?Vaping Use  ? Vaping Use: Former  ?Substance Use Topics  ? Alcohol use: Yes  ?  Alcohol/week: 0.0 standard drinks  ?  Comment: rarely, maybe once a year  ? Drug use: Not Currently  ?  Types: Benzodiazepines, Opium  ? ? ?ROS ? ? ?Objective:  ? ?Vitals: ?BP 119/79   Pulse 99   Temp 98.1 ?F (36.7 ?C)   Resp 18   LMP  (LMP Unknown)   SpO2 96%  ? ?Physical Exam ?Constitutional:   ?   General: She is not in acute distress. ?   Appearance: Normal appearance. She is well-developed and normal weight. She is not ill-appearing, toxic-appearing or diaphoretic.  ?HENT:  ?   Head: Normocephalic and atraumatic.  ?   Right Ear: Tympanic membrane, ear canal and external ear normal. No drainage or tenderness. No middle ear effusion. There is no  impacted cerumen. Tympanic membrane is not erythematous.  ?   Left Ear: Tympanic membrane, ear canal and external ear normal. No drainage or tenderness.  No middle ear effusion. There is no impacted cerumen. Tympanic membrane is not erythematous.  ?   Nose: Nose normal. No congestion or rhinorrhea.  ?   Mouth/Throat:  ?   Mouth: Mucous membranes are moist. No oral lesions.  ?   Pharynx: No pharyngeal swelling, oropharyngeal exudate, posterior oropharyngeal erythema or uvula swelling.  ?   Tonsils: No tonsillar exudate or tonsillar abscesses.  ?Eyes:  ?   General: Lids are everted, no foreign bodies appreciated. No visual field deficit or scleral icterus.    ?   Right eye: No foreign body, discharge or hordeolum.     ?   Left eye: No foreign body, discharge or hordeolum.  ?   Extraocular Movements: Extraocular movements intact.  ?   Right eye: Normal extraocular motion and no nystagmus.  ?   Left eye: Normal extraocular motion and no nystagmus.  ?   Conjunctiva/sclera:  ?   Right eye: Right conjunctiva is not injected. No chemosis, exudate or hemorrhage. ?   Left eye: Left conjunctiva is injected. No chemosis, exudate or hemorrhage. ?   Pupils: Pupils are equal, round, and reactive to light.  ?Cardiovascular:  ?   Rate and Rhythm: Normal rate.  ?Pulmonary:  ?   Effort: Pulmonary effort is normal.  ?Musculoskeletal:  ?   Cervical back: Normal range of motion and neck supple.  ?   Comments: Strength difficult to evaluate as patient had difficulty following instructions for strength testing.  The lower extremities she did have strength 5/5.  Full range of motion throughout.  ?Lymphadenopathy:  ?   Cervical: No cervical adenopathy.  ?Skin: ?   General: Skin is warm and dry.  ?Neurological:  ?   General: No focal deficit present.  ?   Mental Status: She is alert and oriented to person, place, and time.  ?   Cranial Nerves: No cranial nerve deficit.  ?   Motor: No weakness.  ?   Coordination: Coordination normal.  ?    Gait: Gait normal.  ?   Deep Tendon Reflexes: Reflexes normal.  ?   Comments: Patient had significant difficulty following commands for heel-to-shin test, finger-to-nose test.  She was able to perform the heel to toe walk but lost her balance toward the end.  ?Psychiatric:     ?   Mood and Affect: Mood normal.     ?  Behavior: Behavior normal.     ?   Thought Content: Thought content normal.     ?   Judgment: Judgment normal.  ? ? ?Assessment and Plan :  ? ?PDMP not reviewed this encounter. ? ?1. Blurred vision, left eye   ?2. Change in vision   ? ?Recommended further evaluation through the emergency room for point-of-care labs, consideration of a head CT scan.  Patient is hemodynamically stable, discussed transport to the hospital but was agreeable to present by personal vehicle.  I do not believe she is appropriate for EMS transport.  She contracts for safety and will go there now. ?  ?Wallis Bamberg, PA-C ?12/04/21 1854 ? ?

## 2021-12-17 ENCOUNTER — Emergency Department (HOSPITAL_COMMUNITY)
Admission: EM | Admit: 2021-12-17 | Discharge: 2021-12-17 | Disposition: A | Discharge status: Home / Self Care | Payer: Medicaid Other | Attending: Emergency Medicine | Admitting: Emergency Medicine

## 2021-12-17 ENCOUNTER — Emergency Department (HOSPITAL_COMMUNITY): Payer: Medicaid Other

## 2021-12-17 ENCOUNTER — Encounter (HOSPITAL_COMMUNITY): Payer: Self-pay | Admitting: Emergency Medicine

## 2021-12-17 DIAGNOSIS — H547 Unspecified visual loss: Secondary | ICD-10-CM | POA: Insufficient documentation

## 2021-12-17 DIAGNOSIS — D72829 Elevated white blood cell count, unspecified: Secondary | ICD-10-CM | POA: Diagnosis not present

## 2021-12-17 DIAGNOSIS — H5450 Low vision, one eye, unspecified eye: Secondary | ICD-10-CM | POA: Diagnosis not present

## 2021-12-17 DIAGNOSIS — H5460 Unqualified visual loss, one eye, unspecified: Secondary | ICD-10-CM

## 2021-12-17 DIAGNOSIS — Z91040 Latex allergy status: Secondary | ICD-10-CM | POA: Diagnosis not present

## 2021-12-17 DIAGNOSIS — R9431 Abnormal electrocardiogram [ECG] [EKG]: Secondary | ICD-10-CM | POA: Diagnosis not present

## 2021-12-17 DIAGNOSIS — H538 Other visual disturbances: Secondary | ICD-10-CM | POA: Diagnosis not present

## 2021-12-17 LAB — CBC WITH DIFFERENTIAL/PLATELET
Abs Immature Granulocytes: 0.04 10*3/uL (ref 0.00–0.07)
Basophils Absolute: 0.1 10*3/uL (ref 0.0–0.1)
Basophils Relative: 0 %
Eosinophils Absolute: 0.3 10*3/uL (ref 0.0–0.5)
Eosinophils Relative: 3 %
HCT: 41.6 % (ref 36.0–46.0)
Hemoglobin: 13.7 g/dL (ref 12.0–15.0)
Immature Granulocytes: 0 %
Lymphocytes Relative: 24 %
Lymphs Abs: 2.8 10*3/uL (ref 0.7–4.0)
MCH: 29.6 pg (ref 26.0–34.0)
MCHC: 32.9 g/dL (ref 30.0–36.0)
MCV: 89.8 fL (ref 80.0–100.0)
Monocytes Absolute: 0.7 10*3/uL (ref 0.1–1.0)
Monocytes Relative: 6 %
Neutro Abs: 7.9 10*3/uL — ABNORMAL HIGH (ref 1.7–7.7)
Neutrophils Relative %: 67 %
Platelets: 420 10*3/uL — ABNORMAL HIGH (ref 150–400)
RBC: 4.63 MIL/uL (ref 3.87–5.11)
RDW: 12.5 % (ref 11.5–15.5)
WBC: 11.8 10*3/uL — ABNORMAL HIGH (ref 4.0–10.5)
nRBC: 0 % (ref 0.0–0.2)

## 2021-12-17 LAB — BASIC METABOLIC PANEL
Anion gap: 10 (ref 5–15)
BUN: 9 mg/dL (ref 6–20)
CO2: 27 mmol/L (ref 22–32)
Calcium: 9 mg/dL (ref 8.9–10.3)
Chloride: 99 mmol/L (ref 98–111)
Creatinine, Ser: 0.63 mg/dL (ref 0.44–1.00)
GFR, Estimated: >60 mL/min (ref >60)
Glucose, Bld: 95 mg/dL (ref 70–99)
Potassium: 3.4 mmol/L — ABNORMAL LOW (ref 3.5–5.1)
Sodium: 136 mmol/L (ref 135–145)

## 2021-12-17 LAB — HCG, QUANTITATIVE, PREGNANCY: hCG, Beta Chain, Quant, S: <1 m[IU]/mL (ref <5)

## 2021-12-17 MED ORDER — GADOBUTROL 1 MMOL/ML IV SOLN
7.0000 mL | Freq: Once | INTRAVENOUS | Status: AC | PRN
  Administered 2021-12-17: 7 mL via INTRAVENOUS

## 2021-12-17 NOTE — ED Provider Notes (Signed)
?[facility] ?Provider Note ? ? ?CSN: [id number] ?Arrival date & time: 12/17/21  1253 ? ?  ? ?History ? ?Chief Complaint  ?Patient presents with  ? Blurred Vision  ? ? ?MONIGUE Shields is a 31 y.o. female who presents to the ED today with complaint of sudden onset, intermittent, monocular vision loss.  Patient reports on 03/17 she was at work when she began having blurry vision in her left eye.  She states that shortly afterwards she was unable to see anything in her left eye.  She states this lasted approximately 30 minutes before resolving.  She denies any pain with same.  She states she went to urgent care that day who advised she come to the ED for further evaluation.  Patient states that she waited until today to be seen.  She has not had any more symptoms in the left eye.  She does mention that yesterday she had similar symptoms in her right eye however less severe than the left.  She states this lasted approximately 10 minutes before dissipating.  She denies any severe headache, confusion, speech changes, unilateral weakness or numbness.  She states that she was prescribed prescription reading glasses in 2017 however has never had her been rechecked.  She does not wear contacts or glasses currently.  ? ?The history is provided by the patient and medical records.  ? ?  ? ?Home Medications ?Prior to Admission medications   ?Medication Sig Start Date End Date Taking? Authorizing Provider  ?albuterol (VENTOLIN HFA) 108 (90 Base) MCG/ACT inhaler Inhale 2 puffs into the lungs every 6 (six) hours as needed for wheezing. 06/18/20   Karen Kaplan, MD  ?ALPRAZolam Karen Shields) 1 MG tablet Take 1 tablet (1 mg total) by mouth daily as needed for anxiety. 10/13/21   Myrlene Broker, MD  ?benzonatate (TESSALON) 100 MG capsule Take 1 capsule (100 mg total) by mouth every 8 (eight) hours. 05/28/21   Shields, [place], PA-C  ?cetirizine (ZYRTEC) 10 MG tablet Take 10 mg by mouth daily as needed.     [provider]  ?Cholecalciferol (VITAMIN D3) 5000 units CAPS Take by mouth.    [provider]  ?mupirocin nasal ointment (BACTROBAN) 2 % Apply over the rash twice daily for 1 week 11/22/20   Shields, Karen S, PA-C  ?omeprazole (PRILOSEC) 20 MG capsule Take 1 capsule (20 mg total) by mouth daily. 07/29/20   Shields, April, MD  ?ondansetron (ZOFRAN-ODT) 8 MG disintegrating tablet Take 1 tablet (8 mg total) by mouth every 8 (eight) hours as needed for nausea or vomiting. 11/03/21   Karen Martini, PA-C  ?potassium chloride SA (KLOR-CON M) 20 MEQ tablet Take 1 tablet (20 mEq total) by mouth 2 (two) times daily. 11/07/21   Karen Booze, MD  ?vortioxetine HBr (TRINTELLIX) 20 MG TABS tablet Take 1 tablet (20 mg total) by mouth daily. 10/13/21   Myrlene Broker, MD  ?   ? ?Allergies    ?Latex   ? ?Review of Systems   ?Review of Systems  ?Constitutional:  Negative for chills and fever.  ?Eyes:  Positive for visual disturbance. Negative for pain.  ?Gastrointestinal:  Negative for nausea and vomiting.  ?Neurological:  Negative for speech difficulty, weakness, numbness and headaches.  ?All other systems reviewed and are negative. ? ?Physical Exam ?Updated Vital Signs ?BP (!) 121/94 (BP Location: Left Arm)   Pulse 78   Temp 98.2 ?F (36.8 ?C) (Oral)   Resp 16  Ht 5\' 5"  (1.651 m)   Wt 70.3 kg   LMP  (LMP Unknown)   SpO2 97%   BMI 25.79 kg/m?  ?Physical Exam ?Vitals and nursing note reviewed.  ?Constitutional:   ?   Appearance: She is not ill-appearing.  ?HENT:  ?   Head: Normocephalic and atraumatic.  ?Eyes:  ?   Extraocular Movements: Extraocular movements intact.  ?   Conjunctiva/sclera: Conjunctivae normal.  ?   Pupils: Pupils are equal, round, and reactive to light.  ?   Comments: Visual Acuity  ?Bilateral 20/20 ?Left eye 20/30 ?Right eye 20/30  ?Cardiovascular:  ?   Rate and Rhythm: Normal rate and regular rhythm.  ?   Pulses: Normal pulses.  ?Pulmonary:  ?   Effort: Pulmonary effort is normal.  ?   Breath  sounds: Normal breath sounds. No wheezing, rhonchi or rales.  ?Skin: ?   General: Skin is warm and dry.  ?   Coloration: Skin is not jaundiced.  ?Neurological:  ?   Mental Status: She is alert.  ?   Comments: Alert and oriented to self, place, time and event.  ? ?Speech is fluent, clear without dysarthria or dysphasia.  ? ?Strength 5/5 in upper/lower extremities   ?Sensation intact in upper/lower extremities  ? ?Normal gait.  ?Negative Romberg. No pronator drift.  ?Normal finger-to-nose and feet tapping.  ?CN I not tested  ?CN II grossly intact visual fields bilaterally. Did not visualize posterior eye.  ?CN III, IV, VI PERRLA and EOMs intact bilaterally  ?CN V Intact sensation to sharp and light touch to the face  ?CN VII facial movements symmetric  ?CN VIII not tested  ?CN IX, X no uvula deviation, symmetric rise of soft palate  ?CN XI 5/5 SCM and trapezius strength bilaterally  ?CN XII Midline tongue protrusion, symmetric L/R movements   ? ? ?ED Results / Procedures / Treatments   ?Labs ?(all labs ordered are listed, but only abnormal results are displayed) ?Labs Reviewed  ?CBC WITH DIFFERENTIAL/PLATELET - Abnormal; Notable for the following components:  ?    Result Value  ? WBC 11.8 (*)   ? Platelets 420 (*)   ? Neutro Abs 7.9 (*)   ? All other components within normal limits  ?BASIC METABOLIC PANEL - Abnormal; Notable for the following components:  ? Potassium 3.4 (*)   ? All other components within normal limits  ?HCG, QUANTITATIVE, PREGNANCY  ? ? ?EKG ?None ? ?Radiology ?MR ANGIO HEAD WO CONTRAST ? ?Result Date: 12/17/2021 ?CLINICAL DATA:  Provided history: Vision loss, monocular. Additional history provided: Patient reports blurred vision in left eye on March 17th. Blurred vision in right eye yesterday. EXAM: MRI HEAD WITHOUT CONTRAST MRA HEAD WITHOUT CONTRAST MRA NECK WITHOUT AND WITH CONTRAST TECHNIQUE: Multiplanar, multi-echo pulse sequences of the brain and surrounding structures were acquired without  intravenous contrast. Angiographic images of the Circle of Willis were acquired using MRA technique without intravenous contrast. Angiographic images of the neck were acquired using MRA technique without and with intravenous contrast. Carotid stenosis measurements (when applicable) are obtained utilizing NASCET criteria, using the distal internal carotid diameter as the denominator. CONTRAST:  74mL GADAVIST GADOBUTROL 1 MMOL/ML IV SOLN COMPARISON:  Head CT 03/14/2010. FINDINGS: MRI HEAD FINDINGS Brain: Cerebral volume is normal. No cortical encephalomalacia is identified. No significant cerebral white matter disease. There is no acute infarct. No evidence of an intracranial mass. No chronic intracranial blood products. No extra-axial fluid collection. No midline shift. Vascular: Maintained flow  voids within the proximal large arterial vessels. Skull and upper cervical spine: No focal suspicious marrow lesion. Sinuses/Orbits: Visualized orbits show no acute finding. 11 mm mucous retention cyst within the right sphenoid sinus. Minimal mucosal thickening within the bilateral ethmoid air cells. MRA HEAD FINDINGS Anterior circulation: The intracranial internal carotid arteries are patent. The M1 middle cerebral arteries are patent. No M2 proximal branch occlusion or high-grade proximal stenosis is identified. The anterior cerebral arteries are patent. 2 mm posterolaterally directed vascular protrusion arising from the proximal cavernous left ICA, which may reflect a small aneurysm (series 1044, image 2). Posterior circulation: The intracranial vertebral arteries are patent. The basilar artery is patent. The posterior cerebral arteries are patent. Posterior communicating arteries are present bilaterally. Anatomic variants: None significant. MRA NECK FINDINGS Aortic arch: Standard aortic branching. The visualized aortic arch is normal in caliber. No hemodynamically significant innominate or proximal subclavian artery  stenosis. Right carotid system: CCA and ICA smooth and patent within the neck without appreciable stenosis. Left carotid system: CCA and ICA smooth and patent within the neck without appreciable stenosis. Vertebral arteries: V

## 2021-12-17 NOTE — Discharge Instructions (Signed)
Your work-up was overall reassuring in the ED today.  It is recommended that you follow-up with neurology for further evaluation of your symptoms with concern that you are possibly having atypical migraines.  You are also noted to have an area on your MRI that may reflect a small aneurysm that they can further evaluate as well. ? ?Please see Dr. Wynelle Link ophthalmology tomorrow morning at 8 AM for further evaluation of your symptoms to ensure that this is not eye related itself.  ?

## 2021-12-17 NOTE — ED Notes (Signed)
Pt stated she needed to step outside to give someone a key. ?

## 2021-12-17 NOTE — ED Notes (Signed)
Pt has returned from going outside.  ?

## 2021-12-17 NOTE — ED Notes (Signed)
Visual Acuity  ? ?Bilateral 20/20 ?Left eye 20/30 ?Right eye 20/30 ?

## 2021-12-17 NOTE — ED Notes (Signed)
Pt assisted in gown, pt removing jewelry and then to MRI ?

## 2021-12-17 NOTE — ED Triage Notes (Signed)
Pt states she had blurred vision in the left eye on the 17th of March and went to urgent care to be seen. Pt states that is resolved but yesterday she noticed blurred vision in the right eye. Pt being seen for blurred vision in the right eye. Pt has steady gain, denies weakness or dizziness at this time. ?

## 2021-12-18 ENCOUNTER — Telehealth: Payer: Self-pay

## 2021-12-18 NOTE — Telephone Encounter (Signed)
Transition Care Management Follow-up Telephone Call ?Date of discharge and from where: 12/17/2021-[facility]  ?How have you been since you were released from the hospital? Pt stated she is ok. She is currently at the eye doctor she was referred to and then she will be going to a Neurology appointment.  ?Any questions or concerns? No ? ?Items Reviewed: ?Did the pt receive and understand the discharge instructions provided? Yes  ?Medications obtained and verified?  No medications were given at discharge. ?Other? No  ?Any new allergies since your discharge? No  ?Dietary orders reviewed? No ?Do you have support at home? Yes  ? ?Home Care and Equipment/Supplies: ?Were home health services ordered? not applicable ?If so, what is the name of the agency? N/A  ?Has the agency set up a time to come to the patient's home? not applicable ?Were any new equipment or medical supplies ordered?  No ?What is the name of the medical supply agency? N/A ?Were you able to get the supplies/equipment? not applicable ?Do you have any questions related to the use of the equipment or supplies? No ? ?Functional Questionnaire: (I = Independent and D = Dependent) ?ADLs: I ? ?Bathing/Dressing- I ? ?Meal Prep- I ? ?Eating- I ? ?Maintaining continence- I ? ?Transferring/Ambulation- I ? ?Managing Meds- I ? ?Follow up appointments reviewed: ? ?PCP Hospital f/u appt confirmed? No   ?Specialist Hospital f/u appt confirmed? Yes  Scheduled to see eye doctor today.  ?Are transportation arrangements needed? No  ?If their condition worsens, is the pt aware to call PCP or go to the Emergency Dept.? Yes ?Was the patient provided with contact information for the PCP's office or ED? Yes ?Was to pt encouraged to call back with questions or concerns? Yes  ?

## 2021-12-22 ENCOUNTER — Ambulatory Visit (HOSPITAL_COMMUNITY): Payer: Medicaid Other | Admitting: Psychiatry

## 2021-12-22 NOTE — Progress Notes (Deleted)
? ?NEUROLOGY CONSULTATION NOTE ? ?Karen Shields ?MRN: [id number] ?DOB: 1990-11-22 ? ?Referring provider: Fredderick Phenix, MD ?Primary care provider: *** ? ?Reason for consult:  vision loss,  cerebral aneurysm ? ?Assessment/Plan:  ? ?*** ? ? ?Subjective:  ?Karen Shields is a 31 year old female with PTSD who presents for vision loss and cerebral aneurysm.  History supplemented by ED notes.  ? ?In January, she developed sudden onset of blurred vision in the left eye lasting *** minutes.  No associated headache, eye pain, numbness, slurred speech, facial droop or weakness.  She had a recurrent episode on 3/17 lasting 30 minutes.  She went to Urgent Care for further evaluation who advised to go to the ED.  However, she didn't go.  On 3/29, she developed blurred vision in the right eye lasting 10 minutes.  She went to the ED the following day.  MRI of brain and MRA of head and neck personally reviewed revealed no stroke or arterial stenosis but did demonstrate a 2 mm protrusion arising from the proximal cavernous left ICA possibly reflecting a small aneurysm. She followed up with ophthalmology the next day ***.   ?  ? ? ?PAST MEDICAL HISTORY: ?Past Medical History:  ?Diagnosis Date  ? Anxiety   ? Asthma   ? WELL CONTROLLED  ? Asthma   ? Depression   ? pt stated  ? Eczema 2008  ? Gallstones   ? GERD (gastroesophageal reflux disease)   ? Headache   ? Hx MRSA infection 09-2014  ? IBS (irritable bowel syndrome)   ? Ovarian cyst   ? ? ?PAST SURGICAL HISTORY: ?Past Surgical History:  ?Procedure Laterality Date  ? CHOLECYSTECTOMY N/A 01/16/2016  ? Procedure: LAPAROSCOPIC CHOLECYSTECTOMY;  Surgeon: Lattie Haw, MD;  Location: [facility] ORS;  Service: General;  Laterality: N/A;  ? ESOPHAGOGASTRODUODENOSCOPY N/A 11/13/2015  ? IDP:OEUMPNT reflux   ? OVARIAN CYST REMOVAL    ? OVARIAN CYST REMOVAL  2011  ? TUBAL LIGATION    ? WISDOM TOOTH EXTRACTION    ? ? ?MEDICATIONS: ?Current Outpatient Medications on File Prior to Visit  ?Medication  Sig Dispense Refill  ? albuterol (VENTOLIN HFA) 108 (90 Base) MCG/ACT inhaler Inhale 2 puffs into the lungs every 6 (six) hours as needed for wheezing. 18 g 5  ? ALPRAZolam (XANAX) 1 MG tablet Take 1 tablet (1 mg total) by mouth daily as needed for anxiety. 30 tablet 2  ? benzonatate (TESSALON) 100 MG capsule Take 1 capsule (100 mg total) by mouth every 8 (eight) hours. 21 capsule 0  ? cetirizine (ZYRTEC) 10 MG tablet Take 10 mg by mouth daily as needed.     ? Cholecalciferol (VITAMIN D3) 5000 units CAPS Take by mouth.    ? mupirocin nasal ointment (BACTROBAN) 2 % Apply over the rash twice daily for 1 week 1 g 0  ? omeprazole (PRILOSEC) 20 MG capsule Take 1 capsule (20 mg total) by mouth daily. 30 capsule 0  ? ondansetron (ZOFRAN-ODT) 8 MG disintegrating tablet Take 1 tablet (8 mg total) by mouth every 8 (eight) hours as needed for nausea or vomiting. 20 tablet 0  ? potassium chloride SA (KLOR-CON M) 20 MEQ tablet Take 1 tablet (20 mEq total) by mouth 2 (two) times daily. 20 tablet 0  ? vortioxetine HBr (TRINTELLIX) 20 MG TABS tablet Take 1 tablet (20 mg total) by mouth daily. 30 tablet 2  ? ?No current facility-administered medications on file prior to visit.  ? ? ?ALLERGIES: ?  Allergies  ?Allergen Reactions  ? Latex Dermatitis  ? ? ?FAMILY HISTORY: ?Family History  ?Problem Relation Age of Onset  ? Diabetes Mother   ? Anxiety disorder Mother   ? Depression Mother   ? Depression Maternal Grandmother   ? Colon cancer Neg Hx   ? Crohn's disease Neg Hx   ? Celiac disease Neg Hx   ? ? ?Objective:  ?*** ?General: No acute distress.  Patient appears well-groomed.   ?Head:  Normocephalic/atraumatic ?Eyes:  fundi examined but not visualized ?Neck: supple, no paraspinal tenderness, full range of motion ?Back: No paraspinal tenderness ?Heart: regular rate and rhythm ?Lungs: Clear to auscultation bilaterally. ?Vascular: No carotid bruits. ?Neurological Exam: ?Mental status: alert and oriented to person, place, and time, recent  and remote memory intact, fund of knowledge intact, attention and concentration intact, speech fluent and not dysarthric, language intact. ?Cranial nerves: ?CN I: not tested ?CN II: pupils equal, round and reactive to light, visual fields intact ?CN III, IV, VI:  full range of motion, no nystagmus, no ptosis ?CN V: facial sensation intact. ?CN VII: upper and lower face symmetric ?CN VIII: hearing intact ?CN IX, X: gag intact, uvula midline ?CN XI: sternocleidomastoid and trapezius muscles intact ?CN XII: tongue midline ?Bulk & Tone: normal, no fasciculations. ?Motor:  muscle strength 5/5 throughout ?Sensation:  Pinprick, temperature and vibratory sensation intact. ?Deep Tendon Reflexes:  2+ throughout,  toes downgoing.   ?Finger to nose testing:  Without dysmetria.   ?Heel to shin:  Without dysmetria.   ?Gait:  Normal station and stride.  Romberg negative. ? ? ? ?Thank you for allowing me to take part in the care of this patient. ? ?Shon Millet, DO ? ? ? ? ? ? ?

## 2021-12-23 ENCOUNTER — Ambulatory Visit: Payer: Medicaid Other | Admitting: Neurology

## 2021-12-31 NOTE — Progress Notes (Signed)
? ?NEUROLOGY CONSULTATION NOTE ? ?[name] ?MRN: [id number] ?DOB: Feb 26, 1991 ? ?Referring provider: Fredderick Phenix, MD  ?Primary care provider: No PCP ? ?Reason for consult:  aneurysm ? ?Assessment/Plan:  ? 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 ?1  Will check CTA of head. If aneurysm confirmed, would repeat MRA of head in one year (sooner if she exhibits concerning symptoms). ?2  Would not start any medications to treat the migraines for now.  They are not frequent.   ?3  Advised to quit smoking. ?4  Plan to follow up in one year after repeat MRA if CTA confirms small aneurysm. ? ? ?Subjective:  ?Karen Shields is a 31 year old right-female with asthma, depression, anxiety and IBS who presents for cerebral aneurysm.  History supplemented by ED notes. ? ?On 12/04/2021, she was at work when she developed sudden onset blurred and foggy vision in her left eye that quickly progressed to being unable to see at all out of her left eye.  Symptoms lasted 25-30 minutes.  No associated eye pain, headache, facial droop, or unilateral numbness or weakness.  She had a similar event about 3 months prior.  She went to the Urgent Care and was advised to go to the ED.  She didn't go right away but then had another episode but this time occurring in the right eye and only lasted 5-10 minutes.  On 12/17/2021 to go to the ED.  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  She saw ophthalmology the next day with normal exam. She reports a remote history of migraine headaches. ? ? ?PAST MEDICAL HISTORY: ?Past Medical History:  ?Diagnosis Date  ? Anxiety   ? Asthma   ? WELL CONTROLLED  ? Asthma   ? Depression   ? pt stated  ? Eczema 2008  ? Gallstones   ? GERD (gastroesophageal reflux disease)    ? Headache   ? Hx MRSA infection 09-2014  ? IBS (irritable bowel syndrome)   ? Ovarian cyst   ? ? ?PAST SURGICAL HISTORY: ?Past Surgical History:  ?Procedure Laterality Date  ? CHOLECYSTECTOMY N/A 01/16/2016  ? Procedure: LAPAROSCOPIC CHOLECYSTECTOMY;  Surgeon: Lattie Haw, MD;  Location: [facility] ORS;  Service: General;  Laterality: N/A;  ? ESOPHAGOGASTRODUODENOSCOPY N/A 11/13/2015  ? ONG:EXBMWUX reflux   ? OVARIAN CYST REMOVAL    ? OVARIAN CYST REMOVAL  2011  ? TUBAL LIGATION    ? WISDOM TOOTH EXTRACTION    ? ? ?MEDICATIONS: ?Current Outpatient Medications on File Prior to Visit  ?Medication Sig Dispense Refill  ? albuterol (VENTOLIN HFA) 108 (90 Base) MCG/ACT inhaler Inhale 2 puffs into the lungs every 6 (six) hours as needed for wheezing. 18 g 5  ? ALPRAZolam (XANAX) 1 MG tablet Take 1 tablet (1 mg total) by mouth daily as needed for anxiety. 30 tablet 2  ? benzonatate (TESSALON) 100 MG capsule Take 1 capsule (100 mg total) by mouth every 8 (eight) hours. 21 capsule 0  ? cetirizine (ZYRTEC) 10 MG tablet Take 10 mg by mouth daily as needed.     ? Cholecalciferol (VITAMIN D3) 5000 units CAPS Take by mouth.    ? mupirocin nasal ointment (BACTROBAN) 2 % Apply over the rash twice daily for 1 week 1 g 0  ? omeprazole (PRILOSEC) 20  MG capsule Take 1 capsule (20 mg total) by mouth daily. 30 capsule 0  ? ondansetron (ZOFRAN-ODT) 8 MG disintegrating tablet Take 1 tablet (8 mg total) by mouth every 8 (eight) hours as needed for nausea or vomiting. 20 tablet 0  ? potassium chloride SA (KLOR-CON M) 20 MEQ tablet Take 1 tablet (20 mEq total) by mouth 2 (two) times daily. 20 tablet 0  ? vortioxetine HBr (TRINTELLIX) 20 MG TABS tablet Take 1 tablet (20 mg total) by mouth daily. 30 tablet 2  ? ?No current facility-administered medications on file prior to visit.  ? ? ?ALLERGIES: ?Allergies  ?Allergen Reactions  ? Latex Dermatitis  ? ? ?FAMILY HISTORY: ?Family History  ?Problem Relation Age of Onset  ? Diabetes Mother   ? Anxiety  disorder Mother   ? Depression Mother   ? Depression Maternal Grandmother   ? Colon cancer Neg Hx   ? Crohn's disease Neg Hx   ? Celiac disease Neg Hx   ? ? ?Objective:  ?Blood pressure 106/74, pulse 97, height 5\' 3"  (1.6 m), weight 183 lb (83 kg), SpO2 96 %. ?General: No acute distress.  Patient appears well-groomed.   ?Head:  Normocephalic/atraumatic ?Eyes:  fundi examined but not visualized ?Neck: supple, no paraspinal tenderness, full range of motion ?Heart: regular rate and rhythm ?Lungs: Clear to auscultation bilaterally. ?Neurological Exam: ?Mental status: alert and oriented to person, place, and time, recent and remote memory intact, fund of knowledge intact, attention and concentration intact, speech fluent and not dysarthric, language intact. ?Cranial nerves: ?CN I: not tested ?CN II: pupils equal, round and reactive to light, visual fields intact ?CN III, IV, VI:  full range of motion, no nystagmus, no ptosis ?CN V: facial sensation intact. ?CN VII: upper and lower face symmetric ?CN VIII: hearing intact ?CN IX, X: gag intact, uvula midline ?CN XI: sternocleidomastoid and trapezius muscles intact ?CN XII: tongue midline ?Bulk & Tone: normal, no fasciculations. ?Motor:  muscle strength 5/5 throughout ?Sensation:  Pinprick, temperature and vibratory sensation intact. ?Deep Tendon Reflexes:  2+ throughout,  toes downgoing.   ?Finger to nose testing:  Without dysmetria.   ?Heel to shin:  Without dysmetria.   ?Gait:  Normal station and stride.  Romberg negative. ? ? ? ?Thank you for allowing me to take part in the care of this patient. ? ? , DO ? ? ? ? ? ?

## 2022-01-01 ENCOUNTER — Encounter: Payer: Self-pay | Admitting: Neurology

## 2022-01-01 ENCOUNTER — Ambulatory Visit: Payer: Medicaid Other | Admitting: Neurology

## 2022-01-01 VITALS — BP 106/74 | HR 97 | Ht 63.0 in | Wt 183.0 lb

## 2022-01-01 DIAGNOSIS — G43109 Migraine with aura, not intractable, without status migrainosus: Secondary | ICD-10-CM | POA: Diagnosis not present

## 2022-01-01 DIAGNOSIS — I671 Cerebral aneurysm, nonruptured: Secondary | ICD-10-CM

## 2022-01-01 NOTE — Patient Instructions (Signed)
I think the vision episodes are migraines.  You can have migraines with visual symptoms and without headache.  Continue to monitor ? ?To further evaluate the aneurysm, will check CTA of head.  If aneurysm confirmed, then would repeat image in one year.  Further recommendations pending results.  Otherwise, follow up one year.   ?

## 2022-01-05 ENCOUNTER — Telehealth (INDEPENDENT_AMBULATORY_CARE_PROVIDER_SITE_OTHER): Payer: Medicaid Other | Admitting: Psychiatry

## 2022-01-05 ENCOUNTER — Encounter (HOSPITAL_COMMUNITY): Payer: Self-pay | Admitting: Psychiatry

## 2022-01-05 ENCOUNTER — Telehealth (HOSPITAL_COMMUNITY): Payer: Medicaid Other | Admitting: Psychiatry

## 2022-01-05 ENCOUNTER — Ambulatory Visit (INDEPENDENT_AMBULATORY_CARE_PROVIDER_SITE_OTHER): Payer: Medicaid Other | Admitting: Psychiatry

## 2022-01-05 DIAGNOSIS — F4310 Post-traumatic stress disorder, unspecified: Secondary | ICD-10-CM

## 2022-01-05 DIAGNOSIS — F321 Major depressive disorder, single episode, moderate: Secondary | ICD-10-CM | POA: Diagnosis not present

## 2022-01-05 MED ORDER — ALPRAZOLAM 1 MG PO TABS: 1.0000 mg | ORAL_TABLET | Freq: Every day | ORAL | 2 refills | Status: DC | PRN

## 2022-01-05 MED ORDER — VORTIOXETINE HBR 20 MG PO TABS: 20.0000 mg | ORAL_TABLET | Freq: Every day | ORAL | 2 refills | Status: DC

## 2022-01-05 NOTE — Progress Notes (Signed)
Virtual Visit via Telephone Note ? ?I connected with Karen DubinKendall B Boxer on 01/05/22 at  1:20 PM EDT by telephone and verified that I am speaking with the correct person using two identifiers. ? ?Location: ?Patient: home  ?Provider: office ?  ?I discussed the limitations, risks, security and privacy concerns of performing an evaluation and management service by telephone and the availability of in person appointments. I also discussed with the patient that there may be a patient responsible charge related to this service. The patient expressed understanding and agreed to proceed. ? ? ? ?  ?I discussed the assessment and treatment plan with the patient. The patient was provided an opportunity to ask questions and all were answered. The patient agreed with the plan and demonstrated an understanding of the instructions. ?  ?The patient was advised to call back or seek an in-person evaluation if the symptoms worsen or if the condition fails to improve as anticipated. ? ?I provided 20 minutes of non-face-to-face time during this encounter. ? ? ?Diannia Rudereborah Holmes Hays, MD ? ?BH MD/PA/NP OP Progress Note ? ?01/05/2022 1:33 PM ?Karen Shields  ?MRN:  [id number] ? ?Chief Complaint:  ?Chief Complaint  ?Patient presents with  ? Depression  ? Anxiety  ? Follow-up  ? ?HPI: This patient is a [age]year-old separated white female who lives with her mother and her 2 daughters in [place].  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 ?  ?The patient was initially referred by the [facility] she was hospitalized from 4/4 through 12/26/2017 for symptoms of depression anxiety and suicidal ideation ? ?The patient returns for follow-up after 3 months.  Since I last saw her she was seen in the emergency room in February after she passed out due to too much alcohol.  She states she and some friends were drinking shots and she does not remember that she drank too much.  Her blood alcohol level was 259 and her urine drug screen was  positive for benzodiazepines.  She stated that she had taken 0.5 mg of Xanax earlier that day.  Since then she has been scared about drinking at all and is totally stopped.  She denies any thoughts of self-harm or suicide. ? ?Also last month she lost sight in her left eye.  She has had numerous work-ups through ophthalmology and neurology.  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  All of this has been quite stressful.  Nevertheless she states that her mood has been stable and she denies significant depression.  She is generally sleeping well although sometimes has restless nights.  She is still anxious when she goes into grocery stores and sometimes has to leave and go back out.  She is working on all of this with her therapist.  She does feel that her medications have been helpful ?Visit Diagnosis:  ?  ICD-10-CM   ?1. Current moderate episode of major depressive disorder without prior episode (HCC)  F32.1   ?  ?2. PTSD (post-traumatic stress disorder)  F43.10 ALPRAZolam (XANAX) 1 MG tablet  ?  ? ? ?Past Psychiatric History: [facility].  She has had 1 hospitalization 4 years ago for depression with suicidal ideation ? ?Past Medical History:  ?Past Medical History:  ?Diagnosis Date  ? Anxiety   ? Asthma   ? WELL CONTROLLED  ? Asthma   ? Depression   ? pt stated  ? Eczema 2008  ? Gallstones   ?  GERD (gastroesophageal reflux disease)   ? Headache   ? Hx MRSA infection 09-2014  ? IBS (irritable bowel syndrome)   ? Ovarian cyst   ?  ?Past Surgical History:  ?Procedure Laterality Date  ? CHOLECYSTECTOMY N/A 01/16/2016  ? Procedure: LAPAROSCOPIC CHOLECYSTECTOMY;  Surgeon: Lattie Haw, MD;  Location: [facility] ORS;  Service: General;  Laterality: N/A;  ? ESOPHAGOGASTRODUODENOSCOPY N/A 11/13/2015  ? QQP:[id number] reflux   ? OVARIAN CYST REMOVAL    ? OVARIAN CYST REMOVAL  2011  ? TUBAL LIGATION    ? WISDOM TOOTH EXTRACTION    ? ? ?Family Psychiatric History: See  below ? ?Family History:  ?Family History  ?Problem Relation Age of Onset  ? Migraines Mother   ? Diabetes Mother   ? Anxiety disorder Mother   ? Depression Mother   ? Depression Maternal Grandmother   ? Stroke Maternal Grandfather   ? Dementia Paternal Grandfather   ? Colon cancer Neg Hx   ? Crohn's disease Neg Hx   ? Celiac disease Neg Hx   ? ? ?Social History:  ?Social History  ? ?Socioeconomic History  ? Marital status: Legally Separated  ?  Spouse name: Not on file  ? Number of children: 2  ? Years of education: Not on file  ? Highest education level: Not on file  ?Occupational History  ? Not on file  ?Tobacco Use  ? Smoking status: Every Day  ?  Packs/day: 0.75  ?  Years: 5.00  ?  Pack years: 3.75  ?  Types: Cigarettes  ?  Start date: 02/11/2006  ? Smokeless tobacco: Never  ?Vaping Use  ? Vaping Use: Former  ?Substance and Sexual Activity  ? Alcohol use: Yes  ?  Alcohol/week: 0.0 standard drinks  ?  Comment: rarely, maybe once a year  ? Drug use: Not Currently  ?  Types: Benzodiazepines, Opium  ? Sexual activity: Yes  ?  Partners: Male  ?  Birth control/protection: Surgical  ?Other Topics Concern  ? Not on file  ?Social History Narrative  ? ** Merged History Encounter **  ?  Four daughters between herself and husband, two biological.   ? ?Social Determinants of Health  ? ?Financial Resource Strain: Not on file  ?Food Insecurity: Not on file  ?Transportation Needs: Not on file  ?Physical Activity: Not on file  ?Stress: Not on file  ?Social Connections: Not on file  ? ? ?Allergies:  ?Allergies  ?Allergen Reactions  ? Latex Dermatitis  ? ? ?Metabolic Disorder Labs: ?No results found for: HGBA1C, MPG ?No results found for: PROLACTIN ?No results found for: CHOL, TRIG, HDL, CHOLHDL, VLDL, LDLCALC ?Lab Results  ?Component Value Date  ? TSH 1.780 04/23/2020  ? TSH 0.326 (L) 12/22/2017  ? ? ?Therapeutic Level Labs: ?No results found for: LITHIUM ?No results found for: VALPROATE ?No components found for:  CBMZ ? ?Current  Medications: ?Current Outpatient Medications  ?Medication Sig Dispense Refill  ? albuterol (VENTOLIN HFA) 108 (90 Base) MCG/ACT inhaler Inhale 2 puffs into the lungs every 6 (six) hours as needed for wheezing. 18 g 5  ? ALPRAZolam (XANAX) 1 MG tablet Take 1 tablet (1 mg total) by mouth daily as needed for anxiety. 30 tablet 2  ? cetirizine (ZYRTEC) 10 MG tablet Take 10 mg by mouth daily as needed.     ? Cholecalciferol (VITAMIN D3) 5000 units CAPS Take by mouth.    ? ondansetron (ZOFRAN-ODT) 8 MG disintegrating tablet Take 1 tablet (8 mg  total) by mouth every 8 (eight) hours as needed for nausea or vomiting. 20 tablet 0  ? vortioxetine HBr (TRINTELLIX) 20 MG TABS tablet Take 1 tablet (20 mg total) by mouth daily. 30 tablet 2  ? ?No current facility-administered medications for this visit.  ? ? ? ?Musculoskeletal: ?Strength & Muscle Tone: na ?Gait & Station: na ?Patient leans: N/A ? ?Psychiatric Specialty Exam: ?Review of Systems  ?Eyes:  Positive for visual disturbance.  ?Psychiatric/Behavioral:  The patient is nervous/anxious.   ?All other systems reviewed and are negative.  ?There were no vitals taken for this visit.There is no height or weight on file to calculate BMI.  ?General Appearance: NA  ?Eye Contact:  NA  ?Speech:  Clear and Coherent  ?Volume:  Normal  ?Mood:  Anxious and Euthymic  ?Affect:  NA  ?Thought Process:  Goal Directed  ?Orientation:  Full (Time, Place, and Person)  ?Thought Content: WDL   ?Suicidal Thoughts:  No  ?Homicidal Thoughts:  No  ?Memory:  Immediate;   Good ?Recent;   Good ?Remote;   Good  ?Judgement:  Good  ?Insight:  Fair  ?Psychomotor Activity:  Normal  ?Concentration:  Concentration: Good and Attention Span: Good  ?Recall:  Good  ?Fund of Knowledge: Good  ?Language: Good  ?Akathisia:  No  ?Handed:  Right  ?AIMS (if indicated): not done  ?Assets:  Communication Skills ?Desire for Improvement ?Physical Health ?Resilience ?Social Support ?Talents/Skills  ?ADL's:  Intact  ?Cognition:  WNL  ?Sleep:  Fair  ? ?Screenings: ?AIMS   ? ?Flowsheet Row [facility]  ?AIMS Total Score 0  ? ?  ? ?AUDIT   ? ?Flowsheet Row Admission (D
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
## 2022-01-11 ENCOUNTER — Other Ambulatory Visit: Payer: Medicaid Other

## 2022-01-11 ENCOUNTER — Telehealth (HOSPITAL_COMMUNITY): Payer: Medicaid Other | Admitting: Psychiatry

## 2022-01-14 ENCOUNTER — Ambulatory Visit
Admission: RE | Admit: 2022-01-14 | Discharge: 2022-01-14 | Disposition: A | Discharge status: Home / Self Care | Payer: Medicaid Other | Source: Ambulatory Visit | Attending: Neurology | Admitting: Neurology

## 2022-01-14 DIAGNOSIS — H538 Other visual disturbances: Secondary | ICD-10-CM | POA: Diagnosis not present

## 2022-01-14 DIAGNOSIS — J341 Cyst and mucocele of nose and nasal sinus: Secondary | ICD-10-CM | POA: Diagnosis not present

## 2022-01-14 DIAGNOSIS — I671 Cerebral aneurysm, nonruptured: Secondary | ICD-10-CM

## 2022-01-14 MED ORDER — IOPAMIDOL (ISOVUE-370) INJECTION 76%
75.0000 mL | Freq: Once | INTRAVENOUS | Status: AC | PRN
  Administered 2022-01-14: 75 mL via INTRAVENOUS

## 2022-01-18 NOTE — Progress Notes (Signed)
Patient advised of her CTA results.

## 2022-01-22 ENCOUNTER — Ambulatory Visit (HOSPITAL_COMMUNITY): Payer: Medicaid Other | Admitting: Psychiatry

## 2022-02-05 ENCOUNTER — Ambulatory Visit (HOSPITAL_COMMUNITY): Payer: Medicaid Other | Admitting: Psychiatry

## 2022-02-19 ENCOUNTER — Ambulatory Visit (HOSPITAL_COMMUNITY): Payer: Medicaid Other | Admitting: Psychiatry

## 2022-02-19 ENCOUNTER — Telehealth (HOSPITAL_COMMUNITY): Payer: Self-pay | Admitting: Psychiatry

## 2022-02-19 NOTE — Telephone Encounter (Signed)
Therapist attempted to contact patient twice via text through [facility] for scheduled appointment, no response.  Therapist called patient and received voicemail recording phone number is no longer in service.

## 2022-03-01 ENCOUNTER — Other Ambulatory Visit (HOSPITAL_COMMUNITY): Payer: Self-pay | Admitting: Psychiatry

## 2022-03-01 DIAGNOSIS — F4310 Post-traumatic stress disorder, unspecified: Secondary | ICD-10-CM

## 2022-03-09 ENCOUNTER — Encounter: Payer: Self-pay | Admitting: Family Medicine

## 2022-03-09 ENCOUNTER — Ambulatory Visit: Payer: Medicaid Other | Admitting: Family Medicine

## 2022-03-09 ENCOUNTER — Ambulatory Visit (INDEPENDENT_AMBULATORY_CARE_PROVIDER_SITE_OTHER): Payer: Medicaid Other | Admitting: Psychiatry

## 2022-03-09 VITALS — BP 124/72 | HR 110 | Ht 64.0 in | Wt 183.4 lb

## 2022-03-09 DIAGNOSIS — R7301 Impaired fasting glucose: Secondary | ICD-10-CM

## 2022-03-09 DIAGNOSIS — F4310 Post-traumatic stress disorder, unspecified: Secondary | ICD-10-CM | POA: Diagnosis not present

## 2022-03-09 DIAGNOSIS — Z720 Tobacco use: Secondary | ICD-10-CM

## 2022-03-09 DIAGNOSIS — E559 Vitamin D deficiency, unspecified: Secondary | ICD-10-CM

## 2022-03-09 DIAGNOSIS — E079 Disorder of thyroid, unspecified: Secondary | ICD-10-CM

## 2022-03-09 DIAGNOSIS — Z0001 Encounter for general adult medical examination with abnormal findings: Secondary | ICD-10-CM | POA: Diagnosis not present

## 2022-03-09 DIAGNOSIS — F321 Major depressive disorder, single episode, moderate: Secondary | ICD-10-CM | POA: Diagnosis not present

## 2022-03-09 MED ORDER — NICOTINE 21 MG/24HR TD PT24: 21.0000 mg | MEDICATED_PATCH | Freq: Every day | TRANSDERMAL | 0 refills | Status: AC

## 2022-03-09 NOTE — Progress Notes (Addendum)
New Patient Office Visit  Subjective:  Patient ID: AADVIKA Shields, female    DOB: 08/12/1991  Age: 31 y.o. MRN: [id number]  CC:  Chief Complaint  Patient presents with   New Patient (Initial Visit)    Pt would like to establish care would like a check up. Due for pap smear will come back on another visit.  Pt would like to go over CT scan done in April.     HPI ALTHA SWEITZER is a 31 y.o. female with past medical history of GERD presents for establishing care. She c/o of excessive sweating and heat intolerance.She will like to review CT scan done in April.  Past Medical History:  Diagnosis Date   Anxiety    Asthma    WELL CONTROLLED   Asthma    Depression    pt stated   Eczema 2008   Gallstones    GERD (gastroesophageal reflux disease)    Headache    Hx MRSA infection 09-2014   IBS (irritable bowel syndrome)    Ovarian cyst     Past Surgical History:  Procedure Laterality Date   CHOLECYSTECTOMY N/A 01/16/2016   Procedure: LAPAROSCOPIC CHOLECYSTECTOMY;  Surgeon: Florene Glen, MD;  Location: [facility] ORS;  Service: General;  Laterality: N/A;   ESOPHAGOGASTRODUODENOSCOPY N/A 11/13/2015   NFA:OZHYQMV reflux    OVARIAN CYST REMOVAL     OVARIAN CYST REMOVAL  2011   TUBAL LIGATION     WISDOM TOOTH EXTRACTION      Family History  Problem Relation Age of Onset   Migraines Mother    Diabetes Mother    Anxiety disorder Mother    Depression Mother    Depression Maternal Grandmother    Stroke Maternal Grandfather    Dementia Paternal Grandfather    Colon cancer Neg Hx    Crohn's disease Neg Hx    Celiac disease Neg Hx     Social History   Socioeconomic History   Marital status: Legally Separated    Spouse name: Not on file   Number of children: 2   Years of education: Not on file   Highest education level: Not on file  Occupational History   Not on file  Tobacco Use   Smoking status: Every Day    Packs/day: 0.75    Years: 5.00    Total pack years: 3.75     Types: Cigarettes    Start date: 02/11/2006   Smokeless tobacco: Never  Vaping Use   Vaping Use: Former  Substance and Sexual Activity   Alcohol use: Yes    Alcohol/week: 0.0 standard drinks of alcohol    Comment: rarely, maybe once a year   Drug use: Not Currently    Types: Benzodiazepines, Opium   Sexual activity: Yes    Partners: Male    Birth control/protection: Surgical  Other Topics Concern   Not on file  Social History Narrative   ** Merged History Encounter **    Four daughters between herself and husband, two Oncologist.    Social Determinants of Health   Financial Resource Strain: Not on file  Food Insecurity: Not on file  Transportation Needs: Not on file  Physical Activity: Not on file  Stress: Not on file  Social Connections: Not on file  Intimate Partner Violence: Not on file    ROS Review of Systems  Constitutional:  Positive for diaphoresis.  HENT:  Negative for congestion, hearing loss, mouth sores, sore throat and tinnitus.   Respiratory:  Negative for chest tightness and shortness of breath.   Cardiovascular:  Negative for chest pain and palpitations.  Gastrointestinal:  Negative for constipation, diarrhea, nausea and vomiting.  Genitourinary:  Negative for frequency and urgency.  Musculoskeletal:  Negative for back pain and gait problem.  Skin:  Negative for rash and wound.  Neurological:  Negative for numbness and headaches.  Psychiatric/Behavioral:  Negative for confusion, self-injury and suicidal ideas.     Objective:   Today's Vitals: BP 124/72   Pulse (!) 110   Ht '5\' 4"'  (1.626 m)   Wt 183 lb 6.4 oz (83.2 kg)   SpO2 98%   BMI 31.48 kg/m   Physical Exam  Assessment & Plan:   Problem List Items Addressed This Visit       Endocrine   RESOLVED: Thyroid disease     Other   Tobacco use - Primary    -Reports smoking 1 - 1&1/2 ppd -Will like to quit smoking -Nicoderm patch order -Reviewed CT scan result -Pt verbalized  understanding -Pending labs      Relevant Medications   nicotine (NICODERM CQ - DOSED IN MG/24 HOURS) 21 mg/24hr patch   Other Visit Diagnoses     IFG (impaired fasting glucose)       Relevant Orders   Hemoglobin A1C   Vitamin D deficiency       Relevant Orders   Vitamin D (25 hydroxy)   Encounter for general adult medical examination with abnormal findings       Relevant Orders   CBC with Differential/Platelet   CMP14+EGFR   TSH + free T4   Lipid panel       Outpatient Encounter Medications as of 03/09/2022  Medication Sig   albuterol (VENTOLIN HFA) 108 (90 Base) MCG/ACT inhaler Inhale 2 puffs into the lungs every 6 (six) hours as needed for wheezing.   ALPRAZolam (XANAX) 1 MG tablet Take 1 tablet (1 mg total) by mouth daily as needed for anxiety.   amoxicillin (AMOXIL) 500 MG capsule Take 500 mg by mouth 3 (three) times daily.   buprenorphine-naloxone (SUBOXONE) 8-2 mg SUBL SL tablet Place 1 tablet under the tongue 2 (two) times daily.   Cholecalciferol (VITAMIN D3) 5000 units CAPS Take by mouth.   nicotine (NICODERM CQ - DOSED IN MG/24 HOURS) 21 mg/24hr patch Place 1 patch (21 mg total) onto the skin daily. Wear each patch for 16-24 hours per day   ondansetron (ZOFRAN-ODT) 8 MG disintegrating tablet Take 1 tablet (8 mg total) by mouth every 8 (eight) hours as needed for nausea or vomiting.   vortioxetine HBr (TRINTELLIX) 20 MG TABS tablet Take 1 tablet (20 mg total) by mouth daily.   cetirizine (ZYRTEC) 10 MG tablet Take 10 mg by mouth daily as needed.  (Patient not taking: Reported on 03/09/2022)   No facility-administered encounter medications on file as of 03/09/2022.    Follow-up: Return in about 6 weeks (around 04/20/2022) for Pap smear.   Alvira Monday, FNP

## 2022-03-09 NOTE — Progress Notes (Signed)
New p

## 2022-03-09 NOTE — Patient Instructions (Signed)
I appreciate the opportunity to provide care to you today!    Follow up: 6 weeks  Labs: please stop by the lab today to get your blood drawn (CBC, CMP, TSH, Lipid profile, HgA1c, Vit D)  Nicotine Patch Wear each patch for 16-24 hours per day If patients crave cigarettes when they wake up, wear the patch for 24 hours If patients have vivid dreams or other sleep disturbances, they may remove the patch at bedtime and apply a new one in the morning     Please continue to a heart-healthy diet and increase your physical activities. Try to exercise for at least three times a week.      It was a pleasure to see you and I look forward to continuing to work together on your health and well-being. Please do not hesitate to call the office if you need care or have questions about your care.   Have a wonderful day and week. With Gratitude, Gilmore Laroche MSN, FNP-BC
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
## 2022-03-09 NOTE — Assessment & Plan Note (Addendum)
-  Reports smoking 1 - 1&1/2 ppd -Will like to quit smoking -Nicoderm patch order -Reviewed CT scan result -Pt verbalized understanding -Pending labs

## 2022-03-17 ENCOUNTER — Ambulatory Visit (INDEPENDENT_AMBULATORY_CARE_PROVIDER_SITE_OTHER): Payer: Medicaid Other | Admitting: Family Medicine

## 2022-03-17 ENCOUNTER — Encounter: Payer: Self-pay | Admitting: Family Medicine

## 2022-03-17 VITALS — BP 122/78 | HR 71 | Ht 63.0 in | Wt 184.0 lb

## 2022-03-17 DIAGNOSIS — Z0001 Encounter for general adult medical examination with abnormal findings: Secondary | ICD-10-CM | POA: Diagnosis not present

## 2022-03-17 DIAGNOSIS — R7301 Impaired fasting glucose: Secondary | ICD-10-CM | POA: Diagnosis not present

## 2022-03-17 DIAGNOSIS — N3090 Cystitis, unspecified without hematuria: Secondary | ICD-10-CM

## 2022-03-17 DIAGNOSIS — R319 Hematuria, unspecified: Secondary | ICD-10-CM | POA: Diagnosis not present

## 2022-03-17 DIAGNOSIS — E559 Vitamin D deficiency, unspecified: Secondary | ICD-10-CM | POA: Diagnosis not present

## 2022-03-17 LAB — POCT URINALYSIS DIP (CLINITEK)
Bilirubin, UA: NEGATIVE
Glucose, UA: NEGATIVE mg/dL
Ketones, POC UA: NEGATIVE mg/dL
Nitrite, UA: POSITIVE — AB
POC PROTEIN,UA: >=300 — AB
Spec Grav, UA: 1.025 (ref 1.010–1.025)
Urobilinogen, UA: 0.2 E.U./dL
pH, UA: 6 (ref 5.0–8.0)

## 2022-03-17 MED ORDER — SULFAMETHOXAZOLE-TRIMETHOPRIM 800-160 MG PO TABS: 1.0000 | ORAL_TABLET | Freq: Two times a day (BID) | ORAL | 0 refills | Status: AC

## 2022-03-17 NOTE — Assessment & Plan Note (Signed)
-  Symptoms and physical exams findings indicative of UTI - Bactrim ordered with instructions to complete the full course of the antibiotics   You can help prevent UTIs by doing the following:  -Avoid holding urine for prolonged periods; this stretches the bladder and causes bacteria to form because bacteria like warm and wet environments to grow -Empty the bladder as soon as the need arises.  -Empty your bladder soon after intercourse.  -Take showers instead of baths -Wipe front to back; doing so after urinating and after a bowel movement helps prevent bacteria in the anal region from spreading to the vagina and urethra. -Also, drink a full glass of water to help flush bacteria.

## 2022-03-17 NOTE — Patient Instructions (Signed)
I appreciate the opportunity to provide care to you today!    Please pick up your prescription and complete the full course of the antibiotics   You can help prevent UTIs by doing the following:  -Avoid holding urine for prolonged periods; this stretches the bladder and causes bacteria to form because bacteria like warm and wet environments to grow -Empty the bladder as soon as the need arises.  -Empty your bladder soon after intercourse.  -Take showers instead of baths -Wipe front to back; doing so after urinating and after a bowel movement helps prevent bacteria in the anal region from spreading to the vagina and urethra. -Also, drink a full glass of water to help flush bacteria.     Please continue to a heart-healthy diet and increase your physical activities. Try to exercise for at least three times a week.      It was a pleasure to see you and I look forward to continuing to work together on your health and well-being. Please do not hesitate to call the office if you need care or have questions about your care.   Have a wonderful day and week. With Gratitude, Gilmore Laroche MSN, FNP-BC

## 2022-03-17 NOTE — Progress Notes (Signed)
Established Patient Office Visit  Subjective:  Patient ID: Karen Shields, female    DOB: 1991/05/24  Age: 31 y.o. MRN: [id number]  CC:  Chief Complaint  Patient presents with   Hematuria    Pt c/o seeing blood when she wipes started taking AZO but stopped it since she was coming in to be seen, c/o pain when she urinates, onset of sx started yesterday.     HPI Karen Shields is a 31 y.o. female with past medical history of GERD presents with c/o of hematuria, urgency, frequency, and pain with urination that was stated on 03/16/22. No other symptoms were reported, as indicated in the ROS. Reported taking 1 dose of AZO with minimum relief.   Past Medical History:  Diagnosis Date   Anxiety    Asthma    WELL CONTROLLED   Asthma    Depression    pt stated   Eczema 2008   Gallstones    GERD (gastroesophageal reflux disease)    Headache    Hx MRSA infection 09-2014   IBS (irritable bowel syndrome)    Ovarian cyst     Past Surgical History:  Procedure Laterality Date   CHOLECYSTECTOMY N/A 01/16/2016   Procedure: LAPAROSCOPIC CHOLECYSTECTOMY;  Surgeon: Lattie Haw, MD;  Location: [facility] ORS;  Service: General;  Laterality: N/A;   ESOPHAGOGASTRODUODENOSCOPY N/A 11/13/2015   PYK:DXIPJAS reflux    OVARIAN CYST REMOVAL     OVARIAN CYST REMOVAL  2011   TUBAL LIGATION     WISDOM TOOTH EXTRACTION      Family History  Problem Relation Age of Onset   Migraines Mother    Diabetes Mother    Anxiety disorder Mother    Depression Mother    Depression Maternal Grandmother    Stroke Maternal Grandfather    Dementia Paternal Grandfather    Colon cancer Neg Hx    Crohn's disease Neg Hx    Celiac disease Neg Hx     Social History   Socioeconomic History   Marital status: Legally Separated    Spouse name: Not on file   Number of children: 2   Years of education: Not on file   Highest education level: Not on file  Occupational History   Not on file  Tobacco Use   Smoking  status: Every Day    Packs/day: 0.75    Years: 5.00    Total pack years: 3.75    Types: Cigarettes    Start date: 02/11/2006   Smokeless tobacco: Never  Vaping Use   Vaping Use: Former  Substance and Sexual Activity   Alcohol use: Yes    Alcohol/week: 0.0 standard drinks of alcohol    Comment: rarely, maybe once a year   Drug use: Not Currently    Types: Benzodiazepines, Opium   Sexual activity: Yes    Partners: Male    Birth control/protection: Surgical  Other Topics Concern   Not on file  Social History Narrative   ** Merged History Encounter **    Four daughters between herself and husband, two Human resources officer.    Social Determinants of Health   Financial Resource Strain: Not on file  Food Insecurity: Not on file  Transportation Needs: Not on file  Physical Activity: Not on file  Stress: Not on file  Social Connections: Not on file  Intimate Partner Violence: Not on file    Outpatient Medications Prior to Visit  Medication Sig Dispense Refill   albuterol (VENTOLIN HFA) 108 (90  Base) MCG/ACT inhaler Inhale 2 puffs into the lungs every 6 (six) hours as needed for wheezing. 18 g 5   ALPRAZolam (XANAX) 1 MG tablet Take 1 tablet (1 mg total) by mouth daily as needed for anxiety. 30 tablet 2   amoxicillin (AMOXIL) 500 MG capsule Take 500 mg by mouth 3 (three) times daily.     buprenorphine-naloxone (SUBOXONE) 8-2 mg SUBL SL tablet Place 1 tablet under the tongue 2 (two) times daily.     cetirizine (ZYRTEC) 10 MG tablet Take 10 mg by mouth daily as needed.     Cholecalciferol (VITAMIN D3) 5000 units CAPS Take by mouth.     nicotine (NICODERM CQ - DOSED IN MG/24 HOURS) 21 mg/24hr patch Place 1 patch (21 mg total) onto the skin daily. Wear each patch for 16-24 hours per day 36 patch 0   ondansetron (ZOFRAN-ODT) 8 MG disintegrating tablet Take 1 tablet (8 mg total) by mouth every 8 (eight) hours as needed for nausea or vomiting. 20 tablet 0   vortioxetine HBr (TRINTELLIX) 20 MG TABS  tablet Take 1 tablet (20 mg total) by mouth daily. 30 tablet 2   No facility-administered medications prior to visit.    Allergies  Allergen Reactions   Aloe Swelling   Latex Dermatitis    ROS Review of Systems  Constitutional:  Negative for chills, fatigue and fever.  Gastrointestinal:  Negative for abdominal pain.  Genitourinary:  Positive for dysuria, frequency, hematuria and urgency. Negative for decreased urine volume, vaginal bleeding, vaginal discharge and vaginal pain.  Musculoskeletal:  Negative for back pain.      Objective:    Physical Exam Cardiovascular:     Rate and Rhythm: Normal rate and regular rhythm.     Pulses: Normal pulses.     Heart sounds: Normal heart sounds.  Pulmonary:     Effort: Pulmonary effort is normal.     Breath sounds: Normal breath sounds.  Abdominal:     Palpations: Abdomen is soft.     Tenderness: There is abdominal tenderness in the suprapubic area. There is no right CVA tenderness or left CVA tenderness.  Neurological:     Mental Status: She is alert.     BP 122/78   Pulse 71   Ht 5\' 3"  (1.6 m)   Wt 184 lb (83.5 kg)   SpO2 96%   BMI 32.59 kg/m  Wt Readings from Last 3 Encounters:  03/17/22 184 lb (83.5 kg)  03/09/22 183 lb 6.4 oz (83.2 kg)  01/01/22 183 lb (83 kg)    Lab Results  Component Value Date   TSH 1.780 04/23/2020   Lab Results  Component Value Date   WBC 11.8 (H) 12/17/2021   HGB 13.7 12/17/2021   HCT 41.6 12/17/2021   MCV 89.8 12/17/2021   PLT 420 (H) 12/17/2021   Lab Results  Component Value Date   NA 136 12/17/2021   K 3.4 (L) 12/17/2021   CO2 27 12/17/2021   GLUCOSE 95 12/17/2021   BUN 9 12/17/2021   CREATININE 0.63 12/17/2021   BILITOT 0.1 (L) 11/07/2021   ALKPHOS 86 11/07/2021   AST 19 11/07/2021   ALT 15 11/07/2021   PROT 6.8 11/07/2021   ALBUMIN 3.9 11/07/2021   CALCIUM 9.0 12/17/2021   ANIONGAP 10 12/17/2021   No results found for: "CHOL" No results found for: "HDL" No results  found for: "LDLCALC" No results found for: "TRIG" No results found for: "CHOLHDL" No results found for: "HGBA1C"  Assessment & Plan:   Problem List Items Addressed This Visit       Genitourinary   Cystitis    -Symptoms and physical exams findings indicative of UTI - Bactrim ordered with instructions to complete the full course of the antibiotics   You can help prevent UTIs by doing the following:  -Avoid holding urine for prolonged periods; this stretches the bladder and causes bacteria to form because bacteria like warm and wet environments to grow -Empty the bladder as soon as the need arises.  -Empty your bladder soon after intercourse.  -Take showers instead of baths -Wipe front to back; doing so after urinating and after a bowel movement helps prevent bacteria in the anal region from spreading to the vagina and urethra. -Also, drink a full glass of water to help flush bacteria.      Relevant Medications   sulfamethoxazole-trimethoprim (BACTRIM DS) 800-160 MG tablet   Other Visit Diagnoses     Hematuria of unknown cause    -  Primary   Relevant Medications   sulfamethoxazole-trimethoprim (BACTRIM DS) 800-160 MG tablet   Other Relevant Orders   POCT URINALYSIS DIP (CLINITEK) (Completed)       Meds ordered this encounter  Medications   sulfamethoxazole-trimethoprim (BACTRIM DS) 800-160 MG tablet    Sig: Take 1 tablet by mouth 2 (two) times daily for 7 days.    Dispense:  14 tablet    Refill:  0    Follow-up: Return if symptoms worsen or fail to improve.    Alvira Monday, FNP

## 2022-03-18 LAB — CMP14+EGFR
ALT: 19 IU/L (ref 0–32)
AST: 14 IU/L (ref 0–40)
Albumin/Globulin Ratio: 1.7 (ref 1.2–2.2)
Albumin: 4 g/dL (ref 3.8–4.8)
Alkaline Phosphatase: 83 IU/L (ref 44–121)
BUN/Creatinine Ratio: 11 (ref 9–23)
BUN: 8 mg/dL (ref 6–20)
Bilirubin Total: 0.4 mg/dL (ref 0.0–1.2)
CO2: 22 mmol/L (ref 20–29)
Calcium: 9.1 mg/dL (ref 8.7–10.2)
Chloride: 103 mmol/L (ref 96–106)
Creatinine, Ser: 0.76 mg/dL (ref 0.57–1.00)
Globulin, Total: 2.4 g/dL (ref 1.5–4.5)
Glucose: 84 mg/dL (ref 70–99)
Potassium: 3.7 mmol/L (ref 3.5–5.2)
Sodium: 139 mmol/L (ref 134–144)
Total Protein: 6.4 g/dL (ref 6.0–8.5)
eGFR: 107 mL/min/{1.73_m2} (ref >59)

## 2022-03-18 LAB — CBC WITH DIFFERENTIAL/PLATELET
Basophils Absolute: 0.1 10*3/uL (ref 0.0–0.2)
Basos: 1 %
EOS (ABSOLUTE): 0.2 10*3/uL (ref 0.0–0.4)
Eos: 2 %
Hematocrit: 38.6 % (ref 34.0–46.6)
Hemoglobin: 13.2 g/dL (ref 11.1–15.9)
Immature Grans (Abs): 0 10*3/uL (ref 0.0–0.1)
Immature Granulocytes: 0 %
Lymphocytes Absolute: 4.7 10*3/uL — ABNORMAL HIGH (ref 0.7–3.1)
Lymphs: 36 %
MCH: 29.7 pg (ref 26.6–33.0)
MCHC: 34.2 g/dL (ref 31.5–35.7)
MCV: 87 fL (ref 79–97)
Monocytes Absolute: 0.9 10*3/uL (ref 0.1–0.9)
Monocytes: 7 %
Neutrophils Absolute: 7.1 10*3/uL — ABNORMAL HIGH (ref 1.4–7.0)
Neutrophils: 54 %
Platelets: 394 10*3/uL (ref 150–450)
RBC: 4.45 x10E6/uL (ref 3.77–5.28)
RDW: 12.6 % (ref 11.7–15.4)
WBC: 13 10*3/uL — ABNORMAL HIGH (ref 3.4–10.8)

## 2022-03-18 LAB — LIPID PANEL
Chol/HDL Ratio: 4.6 ratio — ABNORMAL HIGH (ref 0.0–4.4)
Cholesterol, Total: 164 mg/dL (ref 100–199)
HDL: 36 mg/dL — ABNORMAL LOW (ref >39)
LDL Chol Calc (NIH): 105 mg/dL — ABNORMAL HIGH (ref 0–99)
Triglycerides: 128 mg/dL (ref 0–149)
VLDL Cholesterol Cal: 23 mg/dL (ref 5–40)

## 2022-03-18 LAB — TSH+FREE T4
Free T4: 1.13 ng/dL (ref 0.82–1.77)
TSH: 3.27 u[IU]/mL (ref 0.450–4.500)

## 2022-03-18 LAB — HEMOGLOBIN A1C
Est. average glucose Bld gHb Est-mCnc: 123 mg/dL
Hgb A1c MFr Bld: 5.9 % — ABNORMAL HIGH (ref 4.8–5.6)

## 2022-03-18 LAB — VITAMIN D 25 HYDROXY (VIT D DEFICIENCY, FRACTURES): Vit D, 25-Hydroxy: 28.1 ng/mL — ABNORMAL LOW (ref 30.0–100.0)

## 2022-03-19 NOTE — Progress Notes (Signed)
Please inform the patient that she has prediabetes. Her Vit D is low; I recommend otc vit d 5000 iu daily.

## 2022-03-29 ENCOUNTER — Telehealth (INDEPENDENT_AMBULATORY_CARE_PROVIDER_SITE_OTHER): Payer: Medicaid Other | Admitting: Psychiatry

## 2022-03-29 ENCOUNTER — Encounter (HOSPITAL_COMMUNITY): Payer: Self-pay | Admitting: Psychiatry

## 2022-03-29 DIAGNOSIS — F4310 Post-traumatic stress disorder, unspecified: Secondary | ICD-10-CM | POA: Diagnosis not present

## 2022-03-29 MED ORDER — ALPRAZOLAM 1 MG PO TABS: 1.0000 mg | ORAL_TABLET | Freq: Every day | ORAL | 2 refills | Status: DC | PRN

## 2022-03-29 MED ORDER — AMITRIPTYLINE HCL 10 MG PO TABS: 10.0000 mg | ORAL_TABLET | Freq: Every day | ORAL | 2 refills | Status: DC

## 2022-03-29 MED ORDER — VORTIOXETINE HBR 20 MG PO TABS: 20.0000 mg | ORAL_TABLET | Freq: Every day | ORAL | 2 refills | Status: DC

## 2022-03-29 NOTE — Progress Notes (Signed)
Virtual Visit via Video Note  I connected with Karen Shields on 03/29/22 at  9:20 AM EDT by a video enabled telemedicine application and verified that I am speaking with the correct person using two identifiers.  Location: Patient: home Provider: office   I discussed the limitations of evaluation and management by telemedicine and the availability of in person appointments. The patient expressed understanding and agreed to proceed.    I discussed the assessment and treatment plan with the patient. The patient was provided an opportunity to ask questions and all were answered. The patient agreed with the plan and demonstrated an understanding of the instructions.   The patient was advised to call back or seek an in-person evaluation if the symptoms worsen or if the condition fails to improve as anticipated.  I provided 20 minutes of non-face-to-face time during this encounter.   Levonne Spiller, MD  [facility] MD/PA/NP OP Progress Note  03/29/2022 9:44 AM Karen Shields  MRN:  [id number]  Chief Complaint:  Chief Complaint  Patient presents with   Anxiety   Depression   Follow-up   HPI: This patient is a 31year-old separated white female who lives with her mother and her 2 daughters in [place].  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  She states she is doing okay.  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  She denies serious depression but has had some more anxiety recently.  She does take Xanax 1 mg daily and I explained that I cannot increase it since she is also taking Suboxone.  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  I suggested a low-dose of amitriptyline to help with both.  The patient denies thoughts of self-harm or suicide and most of the time her mood is good.  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 Visit Diagnosis:    ICD-10-CM   1. PTSD (post-traumatic stress disorder)  F43.10 ALPRAZolam (XANAX) 1 MG tablet      Past Psychiatric History: Past outpatient treatment at [facility].  She has had 1 hospitalization 4 years ago for depression with suicidal ideation  Past Medical History:  Past Medical History:  Diagnosis Date   Anxiety    Asthma    WELL CONTROLLED   Asthma    Depression    pt stated   Eczema 2008   Gallstones    GERD (gastroesophageal reflux disease)    Headache    Hx MRSA infection 09-2014   IBS (irritable bowel syndrome)    Ovarian cyst     Past Surgical History:  Procedure Laterality Date   CHOLECYSTECTOMY N/A 01/16/2016   Procedure: LAPAROSCOPIC CHOLECYSTECTOMY;  Surgeon: Florene Glen, MD;  Location: [facility] ORS;  Service: General;  Laterality: N/A;   ESOPHAGOGASTRODUODENOSCOPY N/A 11/13/2015   [id number] reflux    OVARIAN CYST REMOVAL     OVARIAN CYST REMOVAL  2011   TUBAL LIGATION     WISDOM TOOTH EXTRACTION      Family Psychiatric History: See below  Family History:  Family History  Problem Relation Age of Onset   Migraines Mother    Diabetes Mother    Anxiety disorder Mother    Depression Mother    Depression Maternal Grandmother    Stroke Maternal Grandfather    Dementia Paternal Grandfather    Colon cancer  Neg Hx    Crohn's disease Neg Hx    Celiac disease Neg Hx     Social History:  Social History   Socioeconomic History   Marital status: Legally Separated    Spouse name: Not on file   Number of children: 2   Years of education: Not on file   Highest education level: Not on file  Occupational History   Not on file  Tobacco Use   Smoking status: Every Day    Packs/day: 0.75    Years: 5.00    Total pack years: 3.75    Types: Cigarettes    Start date: 02/11/2006   Smokeless tobacco: Never  Vaping Use   Vaping Use: Former  Substance and Sexual Activity   Alcohol use: Yes    Alcohol/week: 0.0 standard drinks of  alcohol    Comment: rarely, maybe once a year   Drug use: Not Currently    Types: Benzodiazepines, Opium   Sexual activity: Yes    Partners: Male    Birth control/protection: Surgical  Other Topics Concern   Not on file  Social History Narrative   ** Merged History Encounter **    Four daughters between herself and husband, two Human resources officer.    Social Determinants of Health   Financial Resource Strain: Not on file  Food Insecurity: Not on file  Transportation Needs: Not on file  Physical Activity: Not on file  Stress: Not on file  Social Connections: Not on file    Allergies:  Allergies  Allergen Reactions   Aloe Swelling   Latex Dermatitis    Metabolic Disorder Labs: Lab Results  Component Value Date   HGBA1C 5.9 (H) 03/17/2022   No results found for: "PROLACTIN" Lab Results  Component Value Date   CHOL 164 03/17/2022   TRIG 128 03/17/2022   HDL 36 (L) 03/17/2022   CHOLHDL 4.6 (H) 03/17/2022   LDLCALC 105 (H) 03/17/2022   Lab Results  Component Value Date   TSH 3.270 03/17/2022   TSH 1.780 04/23/2020    Therapeutic Level Labs: No results found for: "LITHIUM" No results found for: "VALPROATE" No results found for: "CBMZ"  Current Medications: Current Outpatient Medications  Medication Sig Dispense Refill   amitriptyline (ELAVIL) 10 MG tablet Take 1 tablet (10 mg total) by mouth at bedtime. 30 tablet 2   albuterol (VENTOLIN HFA) 108 (90 Base) MCG/ACT inhaler Inhale 2 puffs into the lungs every 6 (six) hours as needed for wheezing. 18 g 5   ALPRAZolam (XANAX) 1 MG tablet Take 1 tablet (1 mg total) by mouth daily as needed for anxiety. 30 tablet 2   amoxicillin (AMOXIL) 500 MG capsule Take 500 mg by mouth 3 (three) times daily.     buprenorphine-naloxone (SUBOXONE) 8-2 mg SUBL SL tablet Place 1 tablet under the tongue 2 (two) times daily.     cetirizine (ZYRTEC) 10 MG tablet Take 10 mg by mouth daily as needed.     Cholecalciferol (VITAMIN D3) 5000 units  CAPS Take by mouth.     nicotine (NICODERM CQ - DOSED IN MG/24 HOURS) 21 mg/24hr patch Place 1 patch (21 mg total) onto the skin daily. Wear each patch for 16-24 hours per day 36 patch 0   ondansetron (ZOFRAN-ODT) 8 MG disintegrating tablet Take 1 tablet (8 mg total) by mouth every 8 (eight) hours as needed for nausea or vomiting. 20 tablet 0   vortioxetine HBr (TRINTELLIX) 20 MG TABS tablet Take 1 tablet (20 mg total) by  mouth daily. 30 tablet 2   No current facility-administered medications for this visit.     Musculoskeletal: Strength & Muscle Tone: within normal limits Gait & Station: normal Patient leans: N/A  Psychiatric Specialty Exam: Review of Systems  Psychiatric/Behavioral:  Positive for sleep disturbance.   All other systems reviewed and are negative.   There were no vitals taken for this visit.There is no height or weight on file to calculate BMI.  General Appearance: Casual and Fairly Groomed  Eye Contact:  Good  Speech:  Clear and Coherent  Volume:  Normal  Mood:  Euthymic  Affect:  Appropriate and Congruent  Thought Process:  Goal Directed  Orientation:  Full (Time, Place, and Person)  Thought Content: WDL   Suicidal Thoughts:  No  Homicidal Thoughts:  No  Memory:  Immediate;   Good Recent;   Good Remote;   Fair  Judgement:  Good  Insight:  Good  Psychomotor Activity:  Normal  Concentration:  Concentration: Good and Attention Span: Good  Recall:  Good  Fund of Knowledge: Good  Language: Good  Akathisia:  No  Handed:  Right  AIMS (if indicated): not done  Assets:  Communication Skills Desire for Improvement Physical Health Resilience Social Support Talents/Skills  ADL's:  Intact  Cognition: WNL  Sleep:  Fair   Screenings: AIMS    [facility]sheet [facility]  AIMS Total Score 0      AUDIT    Flowsheet Row [facility]  Alcohol Use Disorder Identification Test Final Score (AUDIT) 2      GAD-7    Flowsheet Row Counsel[facility] Counselor from 05/11/2021 [facility] Counselor from 04/27/2021 [facility] Counselor from 10/16/2018 [facility] Counselor from 04/14/2018 [facility]  Total GAD-7 Score 9 10 12 6 9       PHQ2-9    Flowsheet Row Video [facility]2022 [facility] Office Visit from 03/17/2022 in [facility] Office Visit from 03/09/2022 in [facility] Video Visit from 01/05/2022 [facility] Video Visit from 10/13/2021 [facility]  PHQ-2 Total Score 0 0 0 0 0      Flowsheet Row Video Visit [facility] 03/29/2022 [facility] Video Visit from 01/05/2022 [facility] ED from 12/17/2021 in [place] [facility]  C-SSRS RISK CATEGORY No Risk No Risk No Risk        Assessment and Plan: This patient is a 31 year old female with a history of depression and anxiety.  She has been having more difficulty sleeping and has also been diagnosed with ocular migraine so we will start amitriptyline 10 mg at bedtime.  She is no longer drinking alcohol at all.  She will continue Trintellix 20 mg daily for depression and Xanax 1 mg daily only as needed for anxiety or sleep.  She will return to see me in 2 months  Collaboration of Care: Collaboration of Care: Referral or follow-up with counselor/therapist AEB patient will continue follow-up with therapist 38 in our office  Patient/Guardian was advised  Release of Information must be obtained prior to any record release in order to collaborate their care with an outside provider. Patient/Guardian was  advised if they have not already done so to contact the registration department to sign all necessary forms in order for [place] to release information regarding their care.   Consent: Patient/Guardian gives verbal consent for treatment and assignment of benefits for services provided during this visit. Patient/Guardian expressed understanding and agreed to proceed.    Levonne Spiller, MD 03/29/2022, 9:44 AM

## 2022-04-06 ENCOUNTER — Ambulatory Visit (INDEPENDENT_AMBULATORY_CARE_PROVIDER_SITE_OTHER): Payer: Medicaid Other | Admitting: Psychiatry

## 2022-04-06 ENCOUNTER — Telehealth (HOSPITAL_COMMUNITY): Payer: Self-pay | Admitting: *Deleted

## 2022-04-06 DIAGNOSIS — F4310 Post-traumatic stress disorder, unspecified: Secondary | ICD-10-CM

## 2022-04-06 DIAGNOSIS — F321 Major depressive disorder, single episode, moderate: Secondary | ICD-10-CM | POA: Diagnosis not present

## 2022-04-06 NOTE — Telephone Encounter (Signed)
Patient called stating she would like for provider to call her. Per pt she stated that the doctor office that provider was concerned about is giving permission to go ahead to increase her Xanax. Per pt the dose that she's on right now is not helping to last her the whole month. Per pt some days she have to take more then the bottle say but don't have to take this about a lot of the days.   Per pt provider will know what she's talking about and would like for provider to please call her back to go into more details.   [phone]

## 2022-04-06 NOTE — Telephone Encounter (Signed)
Noted  
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
## 2022-04-06 NOTE — Telephone Encounter (Signed)
O told her the pain clinic will need to fax a note allowing a higher dose of xanax.

## 2022-04-09 NOTE — Telephone Encounter (Signed)
LMOM

## 2022-04-16 ENCOUNTER — Telehealth (INDEPENDENT_AMBULATORY_CARE_PROVIDER_SITE_OTHER): Payer: Medicaid Other | Admitting: Psychiatry

## 2022-04-16 ENCOUNTER — Encounter (HOSPITAL_COMMUNITY): Payer: Self-pay | Admitting: Psychiatry

## 2022-04-16 DIAGNOSIS — F4310 Post-traumatic stress disorder, unspecified: Secondary | ICD-10-CM | POA: Diagnosis not present

## 2022-04-16 DIAGNOSIS — F321 Major depressive disorder, single episode, moderate: Secondary | ICD-10-CM | POA: Diagnosis not present

## 2022-04-16 MED ORDER — BUSPIRONE HCL 10 MG PO TABS: 10.0000 mg | ORAL_TABLET | Freq: Three times a day (TID) | ORAL | 2 refills | Status: DC
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
## 2022-04-20 ENCOUNTER — Ambulatory Visit: Payer: Medicaid Other | Admitting: Family Medicine

## 2022-04-21 ENCOUNTER — Ambulatory Visit (INDEPENDENT_AMBULATORY_CARE_PROVIDER_SITE_OTHER): Payer: Medicaid Other | Admitting: Psychiatry

## 2022-04-21 DIAGNOSIS — F4310 Post-traumatic stress disorder, unspecified: Secondary | ICD-10-CM

## 2022-04-21 DIAGNOSIS — F321 Major depressive disorder, single episode, moderate: Secondary | ICD-10-CM
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
## 2022-04-30 ENCOUNTER — Ambulatory Visit
Admission: EM | Admit: 2022-04-30 | Discharge: 2022-04-30 | Disposition: A | Discharge status: Home / Self Care | Payer: Medicaid Other | Attending: Urgent Care | Admitting: Urgent Care

## 2022-04-30 ENCOUNTER — Encounter: Payer: Self-pay | Admitting: Emergency Medicine

## 2022-04-30 DIAGNOSIS — N3001 Acute cystitis with hematuria: Secondary | ICD-10-CM | POA: Diagnosis not present

## 2022-04-30 DIAGNOSIS — R3915 Urgency of urination: Secondary | ICD-10-CM | POA: Insufficient documentation

## 2022-04-30 DIAGNOSIS — R350 Frequency of micturition: Secondary | ICD-10-CM | POA: Diagnosis not present

## 2022-04-30 DIAGNOSIS — R300 Dysuria: Secondary | ICD-10-CM | POA: Diagnosis not present

## 2022-04-30 LAB — POCT URINALYSIS DIP (MANUAL ENTRY)
Glucose, UA: NEGATIVE mg/dL
Nitrite, UA: POSITIVE — AB
Protein Ur, POC: 100 mg/dL — AB
Spec Grav, UA: 1.025 (ref 1.010–1.025)
Urobilinogen, UA: 1 E.U./dL
pH, UA: 6.5 (ref 5.0–8.0)

## 2022-04-30 MED ORDER — CEPHALEXIN 500 MG PO CAPS: 500.0000 mg | ORAL_CAPSULE | Freq: Two times a day (BID) | ORAL | 0 refills | Status: DC

## 2022-04-30 NOTE — ED Triage Notes (Signed)
Patient c/o possible UTI, urgency, dysuria, frequency x 3-4 days.  Patient has taken AZO to help w/sx's

## 2022-04-30 NOTE — ED Provider Notes (Signed)
[facility]   MRN: [id number] DOB: 17-Dec-1990  Subjective:   Karen Shields is a 31 y.o. female presenting for 4 day history of dysuria, urinary urgency, urinary frequency. Has been drinking a lot of sodas. Has rare alcohol, coffee drink. Has not been hydrating as well with water. Was last treated for an UTI 1 month ago. Didn't finish her Bactrim course.   No current facility-administered medications for this encounter.  Current Outpatient Medications:    albuterol (VENTOLIN HFA) 108 (90 Base) MCG/ACT inhaler, Inhale 2 puffs into the lungs every 6 (six) hours as needed for wheezing., Disp: 18 g, Rfl: 5   ALPRAZolam (XANAX) 1 MG tablet, Take 1 tablet (1 mg total) by mouth daily as needed for anxiety., Disp: 30 tablet, Rfl: 2   amoxicillin (AMOXIL) 500 MG capsule, Take 500 mg by mouth 3 (three) times daily., Disp: , Rfl:    buprenorphine-naloxone (SUBOXONE) 8-2 mg SUBL SL tablet, Place 1 tablet under the tongue 2 (two) times daily., Disp: , Rfl:    busPIRone (BUSPAR) 10 MG tablet, Take 1 tablet (10 mg total) by mouth 3 (three) times daily., Disp: 90 tablet, Rfl: 2   cetirizine (ZYRTEC) 10 MG tablet, Take 10 mg by mouth daily as needed., Disp: , Rfl:    Cholecalciferol (VITAMIN D3) 5000 units CAPS, Take by mouth., Disp: , Rfl:    ondansetron (ZOFRAN-ODT) 8 MG disintegrating tablet, Take 1 tablet (8 mg total) by mouth every 8 (eight) hours as needed for nausea or vomiting., Disp: 20 tablet, Rfl: 0   vortioxetine HBr (TRINTELLIX) 20 MG TABS tablet, Take 1 tablet (20 mg total) by mouth daily., Disp: 30 tablet, Rfl: 2   Allergies  Allergen Reactions   Aloe Swelling   Latex Dermatitis    Past Medical History:  Diagnosis Date   Anxiety    Asthma    WELL CONTROLLED   Asthma    Depression    pt stated   Eczema 2008   Gallstones    GERD (gastroesophageal reflux disease)    Headache    Hx MRSA infection 09-2014   IBS (irritable bowel syndrome)    Ovarian cyst       Past Surgical History:  Procedure Laterality Date   CHOLECYSTECTOMY N/A 01/16/2016   Procedure: LAPAROSCOPIC CHOLECYSTECTOMY;  Surgeon: Karen Haw, MD;  Location: [facility] ORS;  Service: General;  Laterality: N/A;   ESOPHAGOGASTRODUODENOSCOPY N/A 11/13/2015   [id number] reflux    OVARIAN CYST REMOVAL     OVARIAN CYST REMOVAL  2011   TUBAL LIGATION     WISDOM TOOTH EXTRACTION      Family History  Problem Relation Age of Onset   Migraines Mother    Diabetes Mother    Anxiety disorder Mother    Depression Mother    Depression Maternal Grandmother    Stroke Maternal Grandfather    Dementia Paternal Grandfather    Colon cancer Neg Hx    Crohn's disease Neg Hx    Celiac disease Neg Hx     Social History   Tobacco Use   Smoking status: Every Day    Packs/day: 0.75    Years: 5.00    Total pack years: 3.75    Types: Cigarettes    Start date: 02/11/2006   Smokeless tobacco: Never  Vaping Use   Vaping Use: Former  Substance Use Topics   Alcohol use: Yes    Alcohol/week: 0.0 standard drinks of alcohol    Comment: rarely, maybe once  a year   Drug use: Not Currently    Types: Benzodiazepines, Opium    ROS   Objective:   Vitals: BP 121/73 (BP Location: Right Arm)   Pulse 99   Temp 97.9 F (36.6 C) (Oral)   Resp 18   Ht 5\' 3"  (1.6 m)   Wt 184 lb 1.4 oz (83.5 kg)   LMP 04/20/2022   SpO2 95%   BMI 32.61 kg/m   Physical Exam Constitutional:      General: She is not in acute distress.    Appearance: Normal appearance. She is well-developed. She is not ill-appearing, toxic-appearing or diaphoretic.  HENT:     Head: Normocephalic and atraumatic.     Nose: Nose normal.     Mouth/Throat:     Mouth: Mucous membranes are moist.     Pharynx: Oropharynx is clear.  Eyes:     General: No scleral icterus.       Right eye: No discharge.        Left eye: No discharge.     Extraocular Movements: Extraocular movements intact.     Conjunctiva/sclera: Conjunctivae  normal.  Cardiovascular:     Rate and Rhythm: Normal rate.  Pulmonary:     Effort: Pulmonary effort is normal.  Abdominal:     General: Bowel sounds are normal. There is no distension.     Palpations: Abdomen is soft. There is no mass.     Tenderness: There is no abdominal tenderness. There is no right CVA tenderness, left CVA tenderness, guarding or rebound.  Skin:    General: Skin is warm and dry.  Neurological:     General: No focal deficit present.     Mental Status: She is alert and oriented to person, place, and time.  Psychiatric:        Mood and Affect: Mood normal.        Behavior: Behavior normal.        Thought Content: Thought content normal.        Judgment: Judgment normal.     Results for orders placed or performed during the hospital encounter of 04/30/22 (from the past 24 hour(s))  POCT urinalysis dipstick     Status: Abnormal   Collection Time: 04/30/22  3:30 PM  Result Value Ref Range   Color, UA yellow yellow   Clarity, UA cloudy (A) clear   Glucose, UA negative negative mg/dL   Bilirubin, UA small (A) negative   Ketones, POC UA trace (5) (A) negative mg/dL   Spec Grav, UA 06/30/22 9.767 - 1.025   Blood, UA small (A) negative   pH, UA 6.5 5.0 - 8.0   Protein Ur, POC =100 (A) negative mg/dL   Urobilinogen, UA 1.0 0.2 or 1.0 E.U./dL   Nitrite, UA Positive (A) Negative   Leukocytes, UA Large (3+) (A) Negative    Assessment and Plan :   PDMP not reviewed this encounter.  1. Acute cystitis with hematuria   2. Dysuria   3. Urinary frequency   4. Urinary urgency     Start Keflex to cover for acute cystitis, urine culture pending.  Recommended aggressive hydration, limiting urinary irritants. Counseled patient on potential for adverse effects with medications prescribed/recommended today, ER and return-to-clinic precautions discussed, patient verbalized understanding.    3.419, Karen Shields 04/30/22 1546

## 2022-04-30 NOTE — Discharge Instructions (Signed)
Please start Keflex to address an urinary tract infection. Make sure you hydrate very well with plain water and a quantity of 80 ounces of water a day.  Please limit drinks that are considered urinary irritants such as soda, sweet tea, coffee, energy drinks, alcohol.  These can worsen your urinary and genital symptoms but also be the source of them.  I will let you know about your urine culture results through MyChart to see if we need to prescribe or change your antibiotics based off of those results.  

## 2022-05-03 LAB — URINE CULTURE: Culture: >=100000 — AB

## 2022-05-05 ENCOUNTER — Ambulatory Visit (HOSPITAL_COMMUNITY): Payer: Medicaid Other | Admitting: Psychiatry

## 2022-05-05 ENCOUNTER — Ambulatory Visit (INDEPENDENT_AMBULATORY_CARE_PROVIDER_SITE_OTHER): Payer: Medicaid Other | Admitting: Family Medicine

## 2022-05-05 ENCOUNTER — Encounter: Payer: Self-pay | Admitting: Family Medicine

## 2022-05-05 ENCOUNTER — Other Ambulatory Visit (HOSPITAL_COMMUNITY)
Admission: RE | Admit: 2022-05-05 | Discharge: 2022-05-05 | Disposition: A | Discharge status: Home / Self Care | Payer: Medicaid Other | Source: Ambulatory Visit | Attending: Family Medicine | Admitting: Family Medicine

## 2022-05-05 VITALS — BP 119/79 | HR 85 | Ht 63.0 in | Wt 179.0 lb

## 2022-05-05 DIAGNOSIS — Z124 Encounter for screening for malignant neoplasm of cervix: Secondary | ICD-10-CM | POA: Diagnosis not present

## 2022-05-05 NOTE — Assessment & Plan Note (Signed)
-   According to the American Cancer Society, cytology and HPV co-testing (preferred) every 5 years or cytology alone (acceptable) every 3 years. - Pap due 2026  

## 2022-05-05 NOTE — Patient Instructions (Signed)
I appreciate the opportunity to provide care to you today!    will call you with your pap smear results    Please continue to a heart-healthy diet and increase your physical activities. Try to exercise for at least three times a week.      It was a pleasure to see you and I look forward to continuing to work together on your health and well-being. Please do not hesitate to call the office if you need care or have questions about your care.   Have a wonderful day and week. With Gratitude, Gilmore Laroche MSN, FNP-BC

## 2022-05-05 NOTE — Progress Notes (Signed)
Established Patient Office Visit  Subjective:  Patient ID: Karen Shields, female    DOB: May 14, 1991  Age: 31 y.o. MRN: [id number]  CC:  Chief Complaint  Patient presents with   Gynecologic Exam    Will be having pap.     HPI Karen Shields is a 31 y.o. female with past medical history of Gallstones presents for a pap smear.  Past Medical History:  Diagnosis Date   Anxiety    Asthma    WELL CONTROLLED   Asthma    Depression    pt stated   Eczema 2008   Gallstones    GERD (gastroesophageal reflux disease)    Headache    Hx MRSA infection 09-2014   IBS (irritable bowel syndrome)    Ovarian cyst     Past Surgical History:  Procedure Laterality Date   CHOLECYSTECTOMY N/A 01/16/2016   Procedure: LAPAROSCOPIC CHOLECYSTECTOMY;  Surgeon: Florene Glen, MD;  Location: [facility] ORS;  Service: General;  Laterality: N/A;   ESOPHAGOGASTRODUODENOSCOPY N/A 11/13/2015   EFE:OFHQRFX reflux    OVARIAN CYST REMOVAL     OVARIAN CYST REMOVAL  2011   TUBAL LIGATION     WISDOM TOOTH EXTRACTION      Family History  Problem Relation Age of Onset   Migraines Mother    Diabetes Mother    Anxiety disorder Mother    Depression Mother    Depression Maternal Grandmother    Stroke Maternal Grandfather    Dementia Paternal Grandfather    Colon cancer Neg Hx    Crohn's disease Neg Hx    Celiac disease Neg Hx     Social History   Socioeconomic History   Marital status: Legally Separated    Spouse name: Not on file   Number of children: 2   Years of education: Not on file   Highest education level: Not on file  Occupational History   Not on file  Tobacco Use   Smoking status: Every Day    Packs/day: 0.75    Years: 5.00    Total pack years: 3.75    Types: Cigarettes    Start date: 02/11/2006   Smokeless tobacco: Never  Vaping Use   Vaping Use: Former  Substance and Sexual Activity   Alcohol use: Yes    Alcohol/week: 0.0 standard drinks of alcohol    Comment: rarely, maybe  once a year   Drug use: Not Currently    Types: Benzodiazepines, Opium   Sexual activity: Yes    Partners: Male    Birth control/protection: Surgical  Other Topics Concern   Not on file  Social History Narrative   ** Merged History Encounter **    Four daughters between herself and husband, two Oncologist.    Social Determinants of Health   Financial Resource Strain: Not on file  Food Insecurity: Not on file  Transportation Needs: Not on file  Physical Activity: Not on file  Stress: Not on file  Social Connections: Not on file  Intimate Partner Violence: Not on file    Outpatient Medications Prior to Visit  Medication Sig Dispense Refill   albuterol (VENTOLIN HFA) 108 (90 Base) MCG/ACT inhaler Inhale 2 puffs into the lungs every 6 (six) hours as needed for wheezing. 18 g 5   ALPRAZolam (XANAX) 1 MG tablet Take 1 tablet (1 mg total) by mouth daily as needed for anxiety. 30 tablet 2   amoxicillin (AMOXIL) 500 MG capsule Take 500 mg by mouth 3 (three)  times daily.     buprenorphine-naloxone (SUBOXONE) 8-2 mg SUBL SL tablet Place 1 tablet under the tongue 2 (two) times daily.     busPIRone (BUSPAR) 10 MG tablet Take 1 tablet (10 mg total) by mouth 3 (three) times daily. 90 tablet 2   cephALEXin (KEFLEX) 500 MG capsule Take 1 capsule (500 mg total) by mouth 2 (two) times daily. 10 capsule 0   cetirizine (ZYRTEC) 10 MG tablet Take 10 mg by mouth daily as needed.     Cholecalciferol (VITAMIN D3) 5000 units CAPS Take by mouth.     ondansetron (ZOFRAN-ODT) 8 MG disintegrating tablet Take 1 tablet (8 mg total) by mouth every 8 (eight) hours as needed for nausea or vomiting. 20 tablet 0   vortioxetine HBr (TRINTELLIX) 20 MG TABS tablet Take 1 tablet (20 mg total) by mouth daily. 30 tablet 2   No facility-administered medications prior to visit.    Allergies  Allergen Reactions   Aloe Swelling   Latex Dermatitis    ROS Review of Systems  Constitutional:  Negative for fatigue and  fever.  Respiratory:  Negative for chest tightness and shortness of breath.   Genitourinary:  Negative for menstrual problem, urgency, vaginal bleeding, vaginal discharge and vaginal pain.  Psychiatric/Behavioral:  Negative for self-injury and suicidal ideas.       Objective:    Physical Exam Cardiovascular:     Rate and Rhythm: Normal rate.     Pulses: Normal pulses.     Heart sounds: Normal heart sounds.  Pulmonary:     Effort: Pulmonary effort is normal.     Breath sounds: Normal breath sounds.  Genitourinary:    Exam position: Lithotomy position.     Pubic Area: No rash.      Tanner stage (genital): 5.     Labia:        Right: No rash or tenderness.        Left: No rash or tenderness.      Comments: Vaginal wall: pink and rugated, smooth and non-tender; absence of lesions, edema, and erythema. Labia Majora and Minora: present bilaterally, moist, soft tissue, and homogeneous; free of edema and ulcerations. Clitoris is anatomically present, above the urethral, and free of lesions, masses, and ulceration.  Neurological:     Mental Status: She is alert.     BP 119/79   Pulse 85   Ht '5\' 3"'  (1.6 m)   Wt 179 lb 0.6 oz (81.2 kg)   LMP 04/20/2022   SpO2 95%   BMI 31.72 kg/m  Wt Readings from Last 3 Encounters:  05/05/22 179 lb 0.6 oz (81.2 kg)  04/30/22 184 lb 1.4 oz (83.5 kg)  03/17/22 184 lb (83.5 kg)    Lab Results  Component Value Date   TSH 3.270 03/17/2022   Lab Results  Component Value Date   WBC 13.0 (H) 03/17/2022   HGB 13.2 03/17/2022   HCT 38.6 03/17/2022   MCV 87 03/17/2022   PLT 394 03/17/2022   Lab Results  Component Value Date   NA 139 03/17/2022   K 3.7 03/17/2022   CO2 22 03/17/2022   GLUCOSE 84 03/17/2022   BUN 8 03/17/2022   CREATININE 0.76 03/17/2022   BILITOT 0.4 03/17/2022   ALKPHOS 83 03/17/2022   AST 14 03/17/2022   ALT 19 03/17/2022   PROT 6.4 03/17/2022   ALBUMIN 4.0 03/17/2022   CALCIUM 9.1 03/17/2022   ANIONGAP 10  12/17/2021   EGFR 107 03/17/2022  Lab Results  Component Value Date   CHOL 164 03/17/2022   Lab Results  Component Value Date   HDL 36 (L) 03/17/2022   Lab Results  Component Value Date   LDLCALC 105 (H) 03/17/2022   Lab Results  Component Value Date   TRIG 128 03/17/2022   Lab Results  Component Value Date   CHOLHDL 4.6 (H) 03/17/2022   Lab Results  Component Value Date   HGBA1C 5.9 (H) 03/17/2022      Assessment & Plan:   Problem List Items Addressed This Visit       Other   Cervical cancer screening - Primary    - According to the [facility], cytology and HPV co-testing (preferred) every 5 years or cytology alone (acceptable) every 3 years. - Pap due 2026       Relevant Orders   Cytology - PAP    No orders of the defined types were placed in this encounter.   Follow-up: Return in about 3 months (around 08/05/2022).    Alvira Monday, FNP

## 2022-05-10 ENCOUNTER — Other Ambulatory Visit: Payer: Self-pay | Admitting: Family Medicine

## 2022-05-10 DIAGNOSIS — B379 Candidiasis, unspecified: Secondary | ICD-10-CM

## 2022-05-10 LAB — CYTOLOGY - PAP
Chlamydia: NEGATIVE
Comment: NEGATIVE
Comment: NEGATIVE
Comment: NEGATIVE
Comment: NORMAL
Diagnosis: NEGATIVE
High risk HPV: NEGATIVE
Neisseria Gonorrhea: NEGATIVE
Trichomonas: NEGATIVE

## 2022-05-10 MED ORDER — FLUCONAZOLE 150 MG PO TABS: 150.0000 mg | ORAL_TABLET | Freq: Once | ORAL | 0 refills | Status: AC

## 2022-05-10 NOTE — Progress Notes (Signed)
Please inform the patient her pap was negative, and her STDs screening was negative. She does have a yeast infection, and I've sent a prescription for diflucan to her pharmacy

## 2022-05-12 ENCOUNTER — Ambulatory Visit: Payer: Medicaid Other | Admitting: Neurology

## 2022-05-19 ENCOUNTER — Encounter (HOSPITAL_COMMUNITY): Payer: Self-pay

## 2022-05-19 ENCOUNTER — Ambulatory Visit (INDEPENDENT_AMBULATORY_CARE_PROVIDER_SITE_OTHER): Payer: Medicaid Other | Admitting: Psychiatry

## 2022-05-19 ENCOUNTER — Telehealth (HOSPITAL_COMMUNITY): Payer: Self-pay | Admitting: Psychiatry

## 2022-05-19 DIAGNOSIS — F4310 Post-traumatic stress disorder, unspecified: Secondary | ICD-10-CM

## 2022-05-19 DIAGNOSIS — F321 Major depressive disorder, single episode, moderate: Secondary | ICD-10-CM | POA: Diagnosis not present
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
## 2022-05-19 NOTE — Telephone Encounter (Signed)
Therapist attempted to contact patient twice via text through caregility platform, no response.  Therapist called patient, left message indicating attempt, and requesting patient call office. 

## 2022-05-19 NOTE — Plan of Care (Signed)
  Problem: Anxiety Disorder CCP Problem excessive worry, panic like symptoms, avoidant behaviors  Goal: " I want to reduce worry and learn how to mange better  Outcome: Progressing Goal: LTG: Patient will reduce worry episodes (ruminating thoughts, irritabilty) to 1 x per week consistently for 60 days  Outcome: Progressing

## 2022-05-26 ENCOUNTER — Ambulatory Visit (INDEPENDENT_AMBULATORY_CARE_PROVIDER_SITE_OTHER): Payer: Medicaid Other | Admitting: Psychiatry

## 2022-05-26 DIAGNOSIS — F4310 Post-traumatic stress disorder, unspecified: Secondary | ICD-10-CM | POA: Diagnosis not present

## 2022-05-26 DIAGNOSIS — F321 Major depressive disorder, single episode, moderate: Secondary | ICD-10-CM | POA: Diagnosis not present
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
## 2022-06-04 ENCOUNTER — Other Ambulatory Visit: Payer: Self-pay | Admitting: Family Medicine

## 2022-06-07 ENCOUNTER — Telehealth (HOSPITAL_COMMUNITY): Payer: Medicaid Other | Admitting: Psychiatry

## 2022-06-15 NOTE — Progress Notes (Signed)
No show

## 2022-06-21 ENCOUNTER — Telehealth (INDEPENDENT_AMBULATORY_CARE_PROVIDER_SITE_OTHER): Payer: Medicaid Other | Admitting: Psychiatry

## 2022-06-21 ENCOUNTER — Encounter (HOSPITAL_COMMUNITY): Payer: Self-pay | Admitting: Psychiatry

## 2022-06-21 DIAGNOSIS — F4310 Post-traumatic stress disorder, unspecified: Secondary | ICD-10-CM

## 2022-06-21 DIAGNOSIS — F321 Major depressive disorder, single episode, moderate: Secondary | ICD-10-CM | POA: Diagnosis not present

## 2022-06-21 MED ORDER — VORTIOXETINE HBR 20 MG PO TABS: 20.0000 mg | ORAL_TABLET | Freq: Every day | ORAL | 2 refills | Status: DC

## 2022-06-21 MED ORDER — ALPRAZOLAM 1 MG PO TABS: 1.0000 mg | ORAL_TABLET | Freq: Every day | ORAL | 2 refills | Status: DC | PRN

## 2022-06-21 NOTE — Progress Notes (Signed)
Virtual Visit via Telephone Note  I connected with Karen Shields on 06/21/22 at 10:20 AM EDT by telephone and verified that I am speaking with the correct person using two identifiers.  Location: Patient: home Provider: office   I discussed the limitations, risks, security and privacy concerns of performing an evaluation and management service by telephone and the availability of in person appointments. I also discussed with the patient that there may be a patient responsible charge related to this service. The patient expressed understanding and agreed to proceed.      I discussed the assessment and treatment plan with the patient. The patient was provided an opportunity to ask questions and all were answered. The patient agreed with the plan and demonstrated an understanding of the instructions.   The patient was advised to call back or seek an in-person evaluation if the symptoms worsen or if the condition fails to improve as anticipated.  I provided 15 minutes of non-face-to-face time during this encounter.   [name], MD  [facility] MD/PA/NP OP Progress Note  06/21/2022 10:32 AM Karen Shields  MRN:  [id number]  Chief Complaint:  Chief Complaint  Patient presents with   Depression   Anxiety   Follow-up   HPI: This patient is a 31 year old separated white female lives with her mother and her 2 daughters in [place].  She is a full-time homemaker  The patient returns returns after about 3 months regarding her depression and anxiety.  She states right now she is doing pretty well.  She denies significant depression and only infrequent anxiety.  She claims that she is trying to stop Suboxone on her own and last week she felt a bit anxious after she stopped taking it but now she is doing okay.  She is staying busy and active.  She denies self-harm or suicide. Visit Diagnosis:    ICD-10-CM   1. Current moderate episode of major depressive disorder without prior episode (HCC)   F32.1     2. PTSD (post-traumatic stress disorder)  F43.10 ALPRAZolam (XANAX) 1 MG tablet      Past Psychiatric History: Past outpatient treatment at [facility].  She has had 1 hospitalization 4 years ago for depression with suicidal ideation  Past Medical History:  Past Medical History:  Diagnosis Date   Anxiety    Asthma    WELL CONTROLLED   Asthma    Depression    pt stated   Eczema 2008   Gallstones    GERD (gastroesophageal reflux disease)    Headache    Hx MRSA infection 09-2014   IBS (irritable bowel syndrome)    Ovarian cyst     Past Surgical History:  Procedure Laterality Date   CHOLECYSTECTOMY N/A 01/16/2016   Procedure: LAPAROSCOPIC CHOLECYSTECTOMY;  Surgeon: Lattie Haw, MD;  Location: [facility] ORS;  Service: General;  Laterality: N/A;   ESOPHAGOGASTRODUODENOSCOPY N/A 11/13/2015   HQI:ONGEXBM reflux    OVARIAN CYST REMOVAL     OVARIAN CYST REMOVAL  2011   TUBAL LIGATION     WISDOM TOOTH EXTRACTION      Family Psychiatric History: See below  Family History:  Family History  Problem Relation Age of Onset   Migraines Mother    Diabetes Mother    Anxiety disorder Mother    Depression Mother    Depression Maternal Grandmother    Stroke Maternal Grandfather    Dementia Paternal Grandfather    Colon cancer Neg Hx    Crohn's disease Neg Hx  Celiac disease Neg Hx     Social History:  Social History   Socioeconomic History   Marital status: Legally Separated    Spouse name: Not on file   Number of children: 2   Years of education: Not on file   Highest education level: Not on file  Occupational History   Not on file  Tobacco Use   Smoking status: Every Day    Packs/day: 0.75    Years: 5.00    Total pack years: 3.75    Types: Cigarettes    Start date: 02/11/2006   Smokeless tobacco: Never  Vaping Use   Vaping Use: Former  Substance and Sexual Activity   Alcohol use: Yes    Alcohol/week: 0.0 standard drinks of alcohol    Comment: rarely, maybe  once a year   Drug use: Not Currently    Types: Benzodiazepines, Opium   Sexual activity: Yes    Partners: Male    Birth control/protection: Surgical  Other Topics Concern   Not on file  Social History Narrative   ** Merged History Encounter **    Four daughters between herself and husband, two Human resources officer.    Social Determinants of Health   Financial Resource Strain: Not on file  Food Insecurity: Not on file  Transportation Needs: Not on file  Physical Activity: Not on file  Stress: Not on file  Social Connections: Not on file    Allergies:  Allergies  Allergen Reactions   Aloe Swelling   Latex Dermatitis    Metabolic Disorder Labs: Lab Results  Component Value Date   HGBA1C 5.9 (H) 03/17/2022   No results found for: "PROLACTIN" Lab Results  Component Value Date   CHOL 164 03/17/2022   TRIG 128 03/17/2022   HDL 36 (L) 03/17/2022   CHOLHDL 4.6 (H) 03/17/2022   LDLCALC 105 (H) 03/17/2022   Lab Results  Component Value Date   TSH 3.270 03/17/2022   TSH 1.780 04/23/2020    Therapeutic Level Labs: No results found for: "LITHIUM" No results found for: "VALPROATE" No results found for: "CBMZ"  Current Medications: Current Outpatient Medications  Medication Sig Dispense Refill   albuterol (VENTOLIN HFA) 108 (90 Base) MCG/ACT inhaler Inhale 2 puffs into the lungs every 6 (six) hours as needed for wheezing. 18 g 5   ALPRAZolam (XANAX) 1 MG tablet Take 1 tablet (1 mg total) by mouth daily as needed for anxiety. 30 tablet 2   amoxicillin (AMOXIL) 500 MG capsule Take 500 mg by mouth 3 (three) times daily.     buprenorphine-naloxone (SUBOXONE) 8-2 mg SUBL SL tablet Place 1 tablet under the tongue 2 (two) times daily.     cephALEXin (KEFLEX) 500 MG capsule Take 1 capsule (500 mg total) by mouth 2 (two) times daily. 10 capsule 0   cetirizine (ZYRTEC) 10 MG tablet Take 10 mg by mouth daily as needed.     Cholecalciferol (VITAMIN D3) 5000 units CAPS Take by mouth.      ondansetron (ZOFRAN-ODT) 8 MG disintegrating tablet Take 1 tablet (8 mg total) by mouth every 8 (eight) hours as needed for nausea or vomiting. 20 tablet 0   vortioxetine HBr (TRINTELLIX) 20 MG TABS tablet Take 1 tablet (20 mg total) by mouth daily. 30 tablet 2   No current facility-administered medications for this visit.     Musculoskeletal: Strength & Muscle Tone: na Gait & Station: na Patient leans: N/A  Psychiatric Specialty Exam: Review of Systems  All other systems reviewed and  are negative.   There were no vitals taken for this visit.There is no height or weight on file to calculate BMI.  General Appearance: NA  Eye Contact:  NA  Speech:  Clear and Coherent  Volume:  Normal  Mood:  Euthymic  Affect:  NA  Thought Process:  Goal Directed  Orientation:  Full (Time, Place, and Person)  Thought Content: WDL   Suicidal Thoughts:  No  Homicidal Thoughts:  No  Memory:  Immediate;   Good Recent;   Good Remote;   Good  Judgement:  Good  Insight:  Fair  Psychomotor Activity:  Normal  Concentration:  Concentration: Good and Attention Span: Good  Recall:  Good  Fund of Knowledge: Good  Language: Good  Akathisia:  No  Handed:  Right  AIMS (if indicated): not done  Assets:  Communication Skills Desire for Improvement Physical Health Resilience Social Support Talents/Skills  ADL's:  Intact  Cognition: WNL  Sleep:  Good   Screenings: AIMS    Flowsheet Row [facility]  AIMS Total Score 0      AUDIT    Flowsheet Row [facility]  Alcohol Use Disorder Identification Test Final Score (AUDIT) 2      GAD-7    Flowsheet Row Counselor [facility]20[facility] Counselor from 01/05/2022 [facility] [facility]or from 05/11/2021 [facility] [facility]or from 04/27/2021 in [facility] Counselor from 10[facility]16/2018 [facility]  Total GAD-7 Score 3 9 10 12 6       PHQ2-9    Flowsheet Row Video Visit from [facility]/21/2022 [facility] Office Visit from 05/05/2022 in [facility] Counselor from 04[facility]21/2022 in [facility] Video Visit from 03/29/2022 [facility] Office Visit from 03[facility]17/2022 in [facility]  PHQ-2 Total Score 0 0 1 0 0      Flowsheet Row Video Visit from 06/21/2022 [facility] ED from 04/30/2022 in [facility] [place] Counselor from 04/21/2022 in [facility]  C-SSRS RISK CATEGORY No Risk No Risk No Risk        Assessment and Plan: This patient is a 31 year old female with a history of depression and anxiety.  She claims that she is trying to come off Suboxone yet she just picked it up on 06/09/2022.  She is going to try to work this out through pain management.  Right now she is not having any withdrawal symptoms.  For now she will continue Trintellix 20 mg daily for depression and Xanax 1 mg daily as needed for anxiety or sleep.  She will return to see me in 3 months  Collaboration of Care: Collaboration of Care: Referral or follow-up with counselor/therapist AEB patient will continue therapy with 06/11/2022 in our office  Patient/Guardian was advised Release of Information must be obtained prior to any record release in order to collaborate their care with an outside provider. Patient/Guardian was advised if they have not already done so to contact the registration department to sign all necessary forms in order for Karen Shields to release information  regarding their care.   Consent: Patient/Guardian gives verbal consent for treatment and assignment of benefits for services provided during this visit. Patient/Guardian expressed understanding and  agreed to proceed.    Levonne Spiller, MD 06/21/2022, 10:32 AM

## 2022-06-30 ENCOUNTER — Ambulatory Visit (INDEPENDENT_AMBULATORY_CARE_PROVIDER_SITE_OTHER): Payer: Medicaid Other | Admitting: Psychiatry

## 2022-06-30 DIAGNOSIS — F4310 Post-traumatic stress disorder, unspecified: Secondary | ICD-10-CM | POA: Diagnosis not present
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
## 2022-07-14 ENCOUNTER — Ambulatory Visit (HOSPITAL_COMMUNITY): Payer: Medicaid Other | Admitting: Psychiatry

## 2022-07-14 ENCOUNTER — Telehealth (HOSPITAL_COMMUNITY): Payer: Self-pay | Admitting: Psychiatry

## 2022-07-14 NOTE — Telephone Encounter (Signed)
Therapist contacted patient via text through [place] for scheduled appointment.  Patient is very sick today.  Therapist and patient agreed to cancel today's appointment and reschedule.

## 2022-07-20 ENCOUNTER — Encounter: Payer: Self-pay | Admitting: Emergency Medicine

## 2022-07-20 ENCOUNTER — Other Ambulatory Visit: Payer: Self-pay

## 2022-07-20 ENCOUNTER — Ambulatory Visit
Admission: EM | Admit: 2022-07-20 | Discharge: 2022-07-20 | Disposition: A | Discharge status: Home / Self Care | Payer: Medicaid Other | Attending: Nurse Practitioner | Admitting: Nurse Practitioner

## 2022-07-20 DIAGNOSIS — R350 Frequency of micturition: Secondary | ICD-10-CM | POA: Diagnosis not present

## 2022-07-20 DIAGNOSIS — Z1152 Encounter for screening for COVID-19: Secondary | ICD-10-CM | POA: Diagnosis not present

## 2022-07-20 DIAGNOSIS — J069 Acute upper respiratory infection, unspecified: Secondary | ICD-10-CM | POA: Insufficient documentation

## 2022-07-20 LAB — POCT URINALYSIS DIP (MANUAL ENTRY)
Bilirubin, UA: NEGATIVE
Blood, UA: NEGATIVE
Glucose, UA: NEGATIVE mg/dL
Ketones, POC UA: NEGATIVE mg/dL
Leukocytes, UA: NEGATIVE
Nitrite, UA: NEGATIVE
Spec Grav, UA: 1.02 (ref 1.010–1.025)
Urobilinogen, UA: 0.2 E.U./dL
pH, UA: 7.5 (ref 5.0–8.0)

## 2022-07-20 LAB — RESP PANEL BY RT-PCR (FLU A&B, COVID) ARPGX2
Influenza A by PCR: NEGATIVE
Influenza B by PCR: NEGATIVE
SARS Coronavirus 2 by RT PCR: NEGATIVE

## 2022-07-20 MED ORDER — BENZONATATE 100 MG PO CAPS: 100.0000 mg | ORAL_CAPSULE | Freq: Three times a day (TID) | ORAL | 0 refills | Status: DC | PRN

## 2022-07-20 NOTE — ED Provider Notes (Signed)
[facility]    CSN: [id number] Arrival date & time: 07/20/22  1431      History   Chief Complaint Chief Complaint  Patient presents with   Urinary Frequency    HPI Karen Shields is a 31 y.o. female.   Patient presents with 1 day of chills, dry cough, sore throat, abdominal pain, nausea, decreased appetite, and fatigue that started yesterday.  She denies shortness of breath, chest pain, chest congestion, nasal congestion, headache, ear pain, vomiting, diarrhea, loss of taste or smell.  Reports she has taken allergy medicine which she thinks helped with some of her symptoms.  Children are sick with similar symptoms.   Also reports she has also been having some urinary frequency since Saturday.  She denies dysuria, hematuria, urinary urgency, vomiting, vaginal discharge, new back pain.  Reports she is prone to UTI and wants to make sure she does not have one.     Past Medical History:  Diagnosis Date   Anxiety    Asthma    WELL CONTROLLED   Asthma    Depression    pt stated   Eczema 2008   Gallstones    GERD (gastroesophageal reflux disease)    Headache    Hx MRSA infection 09-2014   IBS (irritable bowel syndrome)    Ovarian cyst     Patient Active Problem List   Diagnosis Date Noted   Cervical cancer screening 05/05/2022   Cystitis 03/17/2022   Tobacco use 03/09/2022   MDD (major depressive disorder), recurrent episode, severe (HCC) 12/22/2017   Gallstone    RUQ pain 12/25/2015   Gallstones    Reflux esophagitis    Nausea without vomiting 11/04/2015   Cholelithiases 11/04/2015   GERD (gastroesophageal reflux disease) 11/04/2015   Early satiety 11/04/2015   MRSA (methicillin resistant staph aureus) culture positive 12/11/2014   Menstrual migraine without status migrainosus, not intractable 07/10/2014   Anxiety 06/14/2013   Maternal substance abuse (HCC) 04/14/2013   H/O cold sores 04/12/2013   Ovarian cyst, left 02/09/2011    Past Surgical  History:  Procedure Laterality Date   CHOLECYSTECTOMY N/A 01/16/2016   Procedure: LAPAROSCOPIC CHOLECYSTECTOMY;  Surgeon: Lattie Haw, MD;  Location: [facility];  Service: General;  Laterality: N/A;   ESOPHAGOGASTRODUODENOSCOPY N/A 11/13/2015   NWG:NFAOZHY reflux    OVARIAN CYST REMOVAL     OVARIAN CYST REMOVAL  2011   TUBAL LIGATION     WISDOM TOOTH EXTRACTION      OB History     Gravida  2   Para  2   Term  1   Preterm  0   AB  0   Living  2      SAB  0   IAB  0   Ectopic  0   Multiple  0   Live Births  1            Home Medications    Prior to Admission medications   Medication Sig Start Date End Date Taking? Authorizing Provider  benzonatate (TESSALON) 100 MG capsule Take 1 capsule (100 mg total) by mouth 3 (three) times daily as needed for cough. Do not take with alcohol or while driving or operating heavy machinery.  May cause drowsiness. 07/20/22  Yes Valentino Nose, NP  ALPRAZolam Prudy Feeler) 1 MG tablet Take 1 tablet (1 mg total) by mouth daily as needed for anxiety. 06/21/22   Myrlene Broker, MD  buprenorphine-naloxone (SUBOXONE) 8-2 mg SUBL SL  tablet Place 1 tablet under the tongue 2 (two) times daily. 01/19/22   [provider]  cetirizine (ZYRTEC) 10 MG tablet Take 10 mg by mouth daily as needed.    [provider]  Cholecalciferol (VITAMIN D3) 5000 units CAPS Take by mouth.    [provider]  ondansetron (ZOFRAN-ODT) 8 MG disintegrating tablet Take 1 tablet (8 mg total) by mouth every 8 (eight) hours as needed for nausea or vomiting. 11/03/21   Rhys Martini, PA-C  VENTOLIN HFA 108 (90 Base) MCG/ACT inhaler INHALE TWO PUFFS INTO LUNGS FOUR TIMES DAILY 07/02/22   Gilmore Laroche, FNP  vortioxetine HBr (TRINTELLIX) 20 MG TABS tablet Take 1 tablet (20 mg total) by mouth daily. 06/21/22   Myrlene Broker, MD    Family History Family History  Problem Relation Age of Onset   Migraines Mother    Diabetes Mother     Anxiety disorder Mother    Depression Mother    Depression Maternal Grandmother    Stroke Maternal Grandfather    Dementia Paternal Grandfather    Colon cancer Neg Hx    Crohn's disease Neg Hx    Celiac disease Neg Hx     Social History Social History   Tobacco Use   Smoking status: Every Day    Packs/day: 0.75    Years: 5.00    Total pack years: 3.75    Types: Cigarettes    Start date: 02/11/2006   Smokeless tobacco: Never  Vaping Use   Vaping Use: Former  Substance Use Topics   Alcohol use: Yes    Alcohol/week: 0.0 standard drinks of alcohol    Comment: rarely, maybe once a year   Drug use: Not Currently    Types: Benzodiazepines, Opium     Allergies   Aloe and Latex   Review of Systems Review of Systems Per HPI  Physical Exam Triage Vital Signs ED Triage Vitals  Enc Vitals Group     BP 07/20/22 1531 (!) 144/91     Pulse Rate 07/20/22 1531 (!) 106     Resp 07/20/22 1531 16     Temp 07/20/22 1531 98.3 F (36.8 C)     Temp Source 07/20/22 1531 Temporal     SpO2 07/20/22 1531 96 %     Weight 07/20/22 1531 174 lb 5 oz (79.1 kg)     Height --      Head Circumference --      Peak Flow --      Pain Score 07/20/22 1530 4     Pain Loc --      Pain Edu? --      Excl. in GC? --    No data found.  Updated Vital Signs BP (!) 144/91 (BP Location: Right Arm)   Pulse (!) 106   Temp 98.3 F (36.8 C) (Temporal)   Resp 16   Wt 174 lb 5 oz (79.1 kg)   LMP 06/28/2022 (Approximate)   SpO2 96%   BMI 30.88 kg/m   Visual Acuity Right Eye Distance:   Left Eye Distance:   Bilateral Distance:    Right Eye Near:   Left Eye Near:    Bilateral Near:     Physical Exam Vitals and nursing note reviewed.  Constitutional:      General: She is not in acute distress.    Appearance: Normal appearance. She is not ill-appearing or toxic-appearing.  HENT:     Head: Normocephalic and atraumatic.  Right Ear: Tympanic membrane, ear canal and external ear normal.      Left Ear: Tympanic membrane, ear canal and external ear normal.     Nose: No congestion or rhinorrhea.     Mouth/Throat:     Mouth: Mucous membranes are moist.     Pharynx: Oropharynx is clear. No oropharyngeal exudate or posterior oropharyngeal erythema.  Eyes:     General: No scleral icterus.    Extraocular Movements: Extraocular movements intact.  Cardiovascular:     Rate and Rhythm: Normal rate and regular rhythm.  Pulmonary:     Effort: Pulmonary effort is normal. No respiratory distress.     Breath sounds: Normal breath sounds. No wheezing, rhonchi or rales.  Abdominal:     General: Abdomen is flat. Bowel sounds are normal. There is no distension.     Palpations: Abdomen is soft.     Tenderness: There is no abdominal tenderness. There is no right CVA tenderness, left CVA tenderness or guarding.  Musculoskeletal:     Cervical back: Normal range of motion and neck supple.  Lymphadenopathy:     Cervical: No cervical adenopathy.  Skin:    General: Skin is warm and dry.     Coloration: Skin is not jaundiced or pale.     Findings: No erythema or rash.  Neurological:     Mental Status: She is alert and oriented to person, place, and time.  Psychiatric:        Behavior: Behavior is cooperative.      UC Treatments / Results  Labs (all labs ordered are listed, but only abnormal results are displayed) Labs Reviewed  POCT URINALYSIS DIP (MANUAL ENTRY) - Abnormal; Notable for the following components:      Result Value   Protein Ur, POC trace (*)    All other components within normal limits  RESP PANEL BY RT-PCR (FLU A&B, COVID) [id number]  URINE CULTURE    EKG   Radiology No results found.  Procedures Procedures (including critical care time)  Medications Ordered in UC Medications - No data to display  Initial Impression / Assessment and Plan / UC Course  I have reviewed the triage vital signs and the nursing notes.  Pertinent labs & imaging results that were  available during my care of the patient were reviewed by me and considered in my medical decision making (see chart for details).   Patient is well-appearing, normotensive, afebrile, not tachycardic, not tachypneic, oxygenating well on room air.    Encounter for screening for COVID-19 Viral URI with cough COVID-19, influenza testing obtained Symptoms are consistent with viral etiology, also could be allergic rhinitis etiology Supportive care discussed Start cough suppressant Note given for work ER and return precautions discussed  Urinary frequency Urinalysis negative for signs of infection; urine culture pending given urinary frequency Examination reassuring today Encourage increasing water intake Follow-up with PCP if symptoms persist and work up is negative  The patient was given the opportunity to ask questions.  All questions answered to their satisfaction.  The patient is in agreement to this plan.    Final Clinical Impressions(s) / UC Diagnoses   Final diagnoses:  Encounter for screening for COVID-19  Urinary frequency  Viral URI with cough     Discharge Instructions      Urine sample today does not show any signs of infection; we are sending off for culture and will call you if this comes back positive in a couple of days  Your other symptoms  are consistent with a viral upper respiratory infection.  We have tested you for COVID-19 and influenza and we will call you if this test is positive tomorrow. Symptoms should improve over the next week to 10 days.  If you develop chest pain or shortness of breath, go to the emergency room.  Some things that can make you feel better are: - Increased rest - Increasing fluid with water/sugar free electrolytes - Acetaminophen and ibuprofen as needed for fever/pain - Salt water gargling, chloraseptic spray and throat lozenges - OTC guaifenesin (Mucinex) 600 mg twice daily - Saline sinus flushes or a neti pot - Humidifying the  air -Tessalon Perles during the day as needed for dry cough and cough syrup at nighttime as needed for dry cough     ED Prescriptions     Medication Sig Dispense Auth. Provider   benzonatate (TESSALON) 100 MG capsule Take 1 capsule (100 mg total) by mouth 3 (three) times daily as needed for cough. Do not take with alcohol or while driving or operating heavy machinery.  May cause drowsiness. 21 capsule Valentino Nose, NP      PDMP not reviewed this encounter.   Valentino Nose, NP 07/20/22 1723

## 2022-07-20 NOTE — ED Triage Notes (Signed)
Pt reports chills,urinary frequency, intermittent abd pain x2 pains.reports history of similar in the past with UTI.

## 2022-07-20 NOTE — Discharge Instructions (Addendum)
Urine sample today does not show any signs of infection; we are sending off for culture and will call you if this comes back positive in a couple of days  Your other symptoms are consistent with a viral upper respiratory infection.  We have tested you for COVID-19 and influenza and we will call you if this test is positive tomorrow. Symptoms should improve over the next week to 10 days.  If you develop chest pain or shortness of breath, go to the emergency room.  Some things that can make you feel better are: - Increased rest - Increasing fluid with water/sugar free electrolytes - Acetaminophen and ibuprofen as needed for fever/pain - Salt water gargling, chloraseptic spray and throat lozenges - OTC guaifenesin (Mucinex) 600 mg twice daily - Saline sinus flushes or a neti pot - Humidifying the air -Tessalon Perles during the day as needed for dry cough and cough syrup at nighttime as needed for dry cough

## 2022-07-22 ENCOUNTER — Telehealth (HOSPITAL_COMMUNITY): Payer: Self-pay | Admitting: Emergency Medicine

## 2022-07-22 LAB — URINE CULTURE: Culture: >=100000 — AB

## 2022-07-22 MED ORDER — NITROFURANTOIN MONOHYD MACRO 100 MG PO CAPS: 100.0000 mg | ORAL_CAPSULE | Freq: Two times a day (BID) | ORAL | 0 refills | Status: DC

## 2022-07-28 ENCOUNTER — Ambulatory Visit (INDEPENDENT_AMBULATORY_CARE_PROVIDER_SITE_OTHER): Payer: Medicaid Other | Admitting: Psychiatry

## 2022-07-28 DIAGNOSIS — F4310 Post-traumatic stress disorder, unspecified: Secondary | ICD-10-CM | POA: Diagnosis not present

## 2022-07-28 DIAGNOSIS — F321 Major depressive disorder, single episode, moderate: Secondary | ICD-10-CM | POA: Diagnosis not present
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
## 2022-08-05 ENCOUNTER — Encounter: Payer: Self-pay | Admitting: Family Medicine

## 2022-08-05 ENCOUNTER — Ambulatory Visit: Payer: Medicaid Other | Admitting: Family Medicine

## 2022-08-05 VITALS — BP 123/80 | HR 95 | Ht 64.0 in | Wt 180.0 lb

## 2022-08-05 DIAGNOSIS — E7849 Other hyperlipidemia: Secondary | ICD-10-CM | POA: Diagnosis not present

## 2022-08-05 DIAGNOSIS — J4521 Mild intermittent asthma with (acute) exacerbation: Secondary | ICD-10-CM

## 2022-08-05 DIAGNOSIS — Z720 Tobacco use: Secondary | ICD-10-CM

## 2022-08-05 DIAGNOSIS — N6452 Nipple discharge: Secondary | ICD-10-CM | POA: Diagnosis not present

## 2022-08-05 DIAGNOSIS — R7301 Impaired fasting glucose: Secondary | ICD-10-CM

## 2022-08-05 DIAGNOSIS — J45909 Unspecified asthma, uncomplicated: Secondary | ICD-10-CM | POA: Insufficient documentation

## 2022-08-05 DIAGNOSIS — E559 Vitamin D deficiency, unspecified: Secondary | ICD-10-CM

## 2022-08-05 DIAGNOSIS — E038 Other specified hypothyroidism: Secondary | ICD-10-CM

## 2022-08-05 MED ORDER — METHYLPREDNISOLONE 4 MG PO TBPK: ORAL_TABLET | ORAL | 0 refills | Status: DC

## 2022-08-05 MED ORDER — TRELEGY ELLIPTA 100-62.5-25 MCG/ACT IN AEPB: 1.0000 | INHALATION_SPRAY | Freq: Every day | RESPIRATORY_TRACT | 11 refills | Status: DC

## 2022-08-05 NOTE — Patient Instructions (Addendum)
I appreciate the opportunity to provide care to you today!    Follow up:  3 months  Labs: please stop by the lab today or during the week to get your blood drawn (CBC, CMP, TSH, Lipid profile, HgA1c, Vit D)  Screening: hyperprolactinemia (prolactin levels)  -Please pick up your Medrol Dosepak at the pharmacy and start therapy for asthma exacerbation -Start your daily maintenance inhaler after you complete the 6 days prednisone taper -Please rinse your mouth with water after you have taking a dose of your Trelegy inhaler and spit the water out  -We will be performing galacturia work-up today by assessing your prolactin levels kidney and thyroid function test  -Nipple discharge that is bilateral and clear is likely physiologic in etiology which required medical evaluation for high prolactin levels     Please continue to a heart-healthy diet and increase your physical activities. Try to exercise for at least three times a week.      It was a pleasure to see you and I look forward to continuing to work together on your health and well-being. Please do not hesitate to call the office if you need care or have questions about your care.   Have a wonderful day and week. With Gratitude, Gilmore Laroche MSN, FNP-BC

## 2022-08-05 NOTE — Progress Notes (Signed)
Established Patient Office Visit  Subjective:  Patient ID: Karen Shields, female    DOB: Mar 19, 1991  Age: 31 y.o. MRN: [id number]  CC:  Chief Complaint  Patient presents with   Follow-up    3 month f/u   Nasal Congestion    Since 08/03/22   Cough    Since 08/03/22   Breathing Problem    Struggling to breath since 08/03/22    HPI Karen Shields is a 31 y.o. female with past medical history of asthma, left ovarian cyst, cystitis, anxiety presents for f/u of  chronic medical conditions.  Nipple discharge: She reports bilateral nipple discharge that is clear to yellow in appearance. She denies breast lump or mass.  She reports ongoing symptoms that wax and wane.  Asthma exacerbation: She complains of nasal congestion, cough, shortness of breath, headaches, wheezing and frequent use of her albuterol inhaler.  She denies fever and chills.  Onset of symptoms 08/03/2022.   Past Medical History:  Diagnosis Date   Anxiety    Asthma    WELL CONTROLLED   Asthma    Depression    pt stated   Eczema 2008   Gallstones    GERD (gastroesophageal reflux disease)    Headache    Hx MRSA infection 09-2014   IBS (irritable bowel syndrome)    Ovarian cyst     Past Surgical History:  Procedure Laterality Date   CHOLECYSTECTOMY N/A 01/16/2016   Procedure: LAPAROSCOPIC CHOLECYSTECTOMY;  Surgeon: Florene Glen, MD;  Location: [facility] ORS;  Service: General;  Laterality: N/A;   ESOPHAGOGASTRODUODENOSCOPY N/A 11/13/2015   [id number] reflux    OVARIAN CYST REMOVAL     OVARIAN CYST REMOVAL  2011   TUBAL LIGATION     WISDOM TOOTH EXTRACTION      Family History  Problem Relation Age of Onset   Migraines Mother    Diabetes Mother    Anxiety disorder Mother    Depression Mother    Depression Maternal Grandmother    Stroke Maternal Grandfather    Dementia Paternal Grandfather    Colon cancer Neg Hx    Crohn's disease Neg Hx    Celiac disease Neg Hx     Social History    Socioeconomic History   Marital status: Legally Separated    Spouse name: Not on file   Number of children: 2   Years of education: Not on file   Highest education level: Not on file  Occupational History   Not on file  Tobacco Use   Smoking status: Every Day    Packs/day: 0.75    Years: 5.00    Total pack years: 3.75    Types: Cigarettes    Start date: 02/11/2006   Smokeless tobacco: Never  Vaping Use   Vaping Use: Former  Substance and Sexual Activity   Alcohol use: Yes    Alcohol/week: 0.0 standard drinks of alcohol    Comment: rarely, maybe once a year   Drug use: Not Currently    Types: Benzodiazepines, Opium   Sexual activity: Yes    Partners: Male    Birth control/protection: Surgical  Other Topics Concern   Not on file  Social History Narrative   ** Merged History Encounter **    Four daughters between herself and husband, two Oncologist.    Social Determinants of Health   Financial Resource Strain: Not on file  Food Insecurity: Not on file  Transportation Needs: Not on file  Physical Activity: Not  on file  Stress: Not on file  Social Connections: Not on file  Intimate Partner Violence: Not on file    Outpatient Medications Prior to Visit  Medication Sig Dispense Refill   ALPRAZolam (XANAX) 1 MG tablet Take 1 tablet (1 mg total) by mouth daily as needed for anxiety. 30 tablet 2   buprenorphine-naloxone (SUBOXONE) 8-2 mg SUBL SL tablet Place 1 tablet under the tongue 2 (two) times daily.     cetirizine (ZYRTEC) 10 MG tablet Take 10 mg by mouth daily as needed.     Cholecalciferol (VITAMIN D3) 5000 units CAPS Take by mouth.     VENTOLIN HFA 108 (90 Base) MCG/ACT inhaler INHALE TWO PUFFS INTO LUNGS FOUR TIMES DAILY 18 g 0   vortioxetine HBr (TRINTELLIX) 20 MG TABS tablet Take 1 tablet (20 mg total) by mouth daily. 30 tablet 2   benzonatate (TESSALON) 100 MG capsule Take 1 capsule (100 mg total) by mouth 3 (three) times daily as needed for cough. Do not take  with alcohol or while driving or operating heavy machinery.  May cause drowsiness. (Patient not taking: Reported on 08/05/2022) 21 capsule 0   ondansetron (ZOFRAN-ODT) 8 MG disintegrating tablet Take 1 tablet (8 mg total) by mouth every 8 (eight) hours as needed for nausea or vomiting. 20 tablet 0   nitrofurantoin, macrocrystal-monohydrate, (MACROBID) 100 MG capsule Take 1 capsule (100 mg total) by mouth 2 (two) times daily. 10 capsule 0   No facility-administered medications prior to visit.    Allergies  Allergen Reactions   Aloe Swelling   Latex Dermatitis    ROS Review of Systems  Constitutional:  Negative for chills and fatigue.  HENT:  Positive for congestion. Negative for sinus pressure and sinus pain.   Respiratory:  Positive for cough (worse at night time), chest tightness (nightime), shortness of breath and wheezing.   Cardiovascular:  Negative for chest pain and palpitations.  Endocrine:       Nipple discharge  Neurological:  Positive for headaches.      Objective:    Physical Exam HENT:     Head: Normocephalic.     Right Ear: External ear normal.  Cardiovascular:     Rate and Rhythm: Normal rate.     Pulses: Normal pulses.     Heart sounds: Normal heart sounds.  Pulmonary:     Breath sounds: Wheezing present.  Chest:  Breasts:    Right: Nipple discharge present. No bleeding or skin change.     Left: Nipple discharge present. No bleeding or skin change.  Musculoskeletal:     Cervical back: No rigidity.  Neurological:     Mental Status: She is alert.     BP 123/80 (BP Location: Right Arm, Patient Position: Sitting, Cuff Size: Normal)   Pulse 95   Ht _0  (1.626 m)   Wt 180 lb (81.6 kg)   LMP 08/05/2022 (Approximate)   SpO2 91%   BMI 30.90 kg/m  Wt Readings from Last 3 Encounters:  08/05/22 180 lb (81.6 kg)  07/20/22 174 lb 5 oz (79.1 kg)  05/05/22 179 lb 0.6 oz (81.2 kg)    Lab Results  Component Value Date   TSH 3.270 03/17/2022   Lab  Results  Component Value Date   WBC 13.0 (H) 03/17/2022   HGB 13.2 03/17/2022   HCT 38.6 03/17/2022   MCV 87 03/17/2022   PLT 394 03/17/2022   Lab Results  Component Value Date   NA 139 03/17/2022  K 3.7 03/17/2022   CO2 22 03/17/2022   GLUCOSE 84 03/17/2022   BUN 8 03/17/2022   CREATININE 0.76 03/17/2022   BILITOT 0.4 03/17/2022   ALKPHOS 83 03/17/2022   AST 14 03/17/2022   ALT 19 03/17/2022   PROT 6.4 03/17/2022   ALBUMIN 4.0 03/17/2022   CALCIUM 9.1 03/17/2022   ANIONGAP 10 12/17/2021   EGFR 107 03/17/2022   Lab Results  Component Value Date   CHOL 164 03/17/2022   Lab Results  Component Value Date   HDL 36 (L) 03/17/2022   Lab Results  Component Value Date   LDLCALC 105 (H) 03/17/2022   Lab Results  Component Value Date   TRIG 128 03/17/2022   Lab Results  Component Value Date   CHOLHDL 4.6 (H) 03/17/2022   Lab Results  Component Value Date   HGBA1C 5.9 (H) 03/17/2022      Assessment & Plan:   Problem List Items Addressed This Visit       Respiratory   Asthma - Primary    She reports frequent use of albuterol inhaler We will treat for asthma exacerbation today Medrol Dosepak ordered We will start patient on maintenance treatment for her asthma Patient is provided a sample of Trelegy and informed to start therapy after she completes her oral steroid treatment Encourage patient to rinse her mouth with water without swallowing after using Trelegy to help reduce her chance of getting thrush       Relevant Medications   methylPREDNISolone (MEDROL DOSEPAK) 4 MG TBPK tablet   Fluticasone-Umeclidin-Vilant (TRELEGY ELLIPTA) 100-62.5-25 MCG/ACT AEPB     Other   Nipple discharge in female    Nipple discharge that is bilateral and nonbloody is likely physiologic Will assess prolactin level to rule out possible hyperprolactinemia The patient is currently on her cycle, which rules out pregnancy Will assess renal and thyroid function test today       Relevant Orders   Prolactin   Other Visit Diagnoses     Vitamin D deficiency       Relevant Orders   VITAMIN D 25 Hydroxy (Vit-D Deficiency, Fractures)   IFG (impaired fasting glucose)       Relevant Orders   Hemoglobin A1c   Other specified hypothyroidism       Relevant Orders   TSH + free T4   Other hyperlipidemia       Relevant Orders   Lipid panel   CMP14+EGFR   CBC with Differential/Platelet       Meds ordered this encounter  Medications   methylPREDNISolone (MEDROL DOSEPAK) 4 MG TBPK tablet    Sig: Take as the package instructed    Dispense:  1 each    Refill:  0   Fluticasone-Umeclidin-Vilant (TRELEGY ELLIPTA) 100-62.5-25 MCG/ACT AEPB    Sig: Inhale 1 puff into the lungs daily.    Dispense:  1 each    Refill:  11    Follow-up: Return in about 3 months (around 11/05/2022).    Alvira Monday, FNP

## 2022-08-05 NOTE — Assessment & Plan Note (Signed)
She reports frequent use of albuterol inhaler We will treat for asthma exacerbation today Medrol Dosepak ordered We will start patient on maintenance treatment for her asthma Patient is provided a sample of Trelegy and informed to start therapy after she completes her oral steroid treatment Encourage patient to rinse her mouth with water without swallowing after using Trelegy to help reduce her chance of getting thrush

## 2022-08-05 NOTE — Assessment & Plan Note (Signed)
Nipple discharge that is bilateral and nonbloody is likely physiologic Will assess prolactin level to rule out possible hyperprolactinemia The patient is currently on her cycle, which rules out pregnancy Will assess renal and thyroid function test today

## 2022-08-09 ENCOUNTER — Telehealth: Payer: Self-pay

## 2022-08-09 ENCOUNTER — Other Ambulatory Visit: Payer: Self-pay | Admitting: Family Medicine

## 2022-08-09 ENCOUNTER — Telehealth: Payer: Self-pay | Admitting: Family Medicine

## 2022-08-09 DIAGNOSIS — R7301 Impaired fasting glucose: Secondary | ICD-10-CM | POA: Diagnosis not present

## 2022-08-09 DIAGNOSIS — J4521 Mild intermittent asthma with (acute) exacerbation: Secondary | ICD-10-CM

## 2022-08-09 DIAGNOSIS — E038 Other specified hypothyroidism: Secondary | ICD-10-CM | POA: Diagnosis not present

## 2022-08-09 DIAGNOSIS — L309 Dermatitis, unspecified: Secondary | ICD-10-CM

## 2022-08-09 DIAGNOSIS — E7849 Other hyperlipidemia: Secondary | ICD-10-CM | POA: Diagnosis not present

## 2022-08-09 DIAGNOSIS — E559 Vitamin D deficiency, unspecified: Secondary | ICD-10-CM | POA: Diagnosis not present

## 2022-08-09 MED ORDER — TRIAMCINOLONE ACETONIDE 0.1 % EX CREA: 1.0000 | TOPICAL_CREAM | Freq: Two times a day (BID) | CUTANEOUS | 0 refills | Status: DC

## 2022-08-09 MED ORDER — NYSTATIN-TRIAMCINOLONE 100000-0.1 UNIT/GM-% EX OINT: 1.0000 | TOPICAL_OINTMENT | Freq: Two times a day (BID) | CUTANEOUS | 0 refills | Status: AC

## 2022-08-09 MED ORDER — BUDESONIDE-FORMOTEROL FUMARATE 80-4.5 MCG/ACT IN AERO: 2.0000 | INHALATION_SPRAY | Freq: Two times a day (BID) | RESPIRATORY_TRACT | 3 refills | Status: AC

## 2022-08-09 NOTE — Telephone Encounter (Signed)
Pt states she is having breast discharge & itching. Wants to know what to do? Was seen last week?

## 2022-08-09 NOTE — Telephone Encounter (Signed)
Lvm letting pt know.  

## 2022-08-09 NOTE — Telephone Encounter (Signed)
Please inform the patient that I sent a prescription for topical ointment  Mycolog) to apply to the breast twice daily to relieve symptoms of nasal itching.

## 2022-08-09 NOTE — Telephone Encounter (Signed)
Rx sent, symbicort

## 2022-08-10 LAB — CBC WITH DIFFERENTIAL/PLATELET
Basophils Absolute: 0.1 10*3/uL (ref 0.0–0.2)
Basos: 1 %
EOS (ABSOLUTE): 0.5 10*3/uL — ABNORMAL HIGH (ref 0.0–0.4)
Eos: 3 %
Hematocrit: 38.2 % (ref 34.0–46.6)
Hemoglobin: 12.7 g/dL (ref 11.1–15.9)
Immature Grans (Abs): 0.1 10*3/uL (ref 0.0–0.1)
Immature Granulocytes: 1 %
Lymphocytes Absolute: 5.8 10*3/uL — ABNORMAL HIGH (ref 0.7–3.1)
Lymphs: 38 %
MCH: 29.6 pg (ref 26.6–33.0)
MCHC: 33.2 g/dL (ref 31.5–35.7)
MCV: 89 fL (ref 79–97)
Monocytes Absolute: 0.9 10*3/uL (ref 0.1–0.9)
Monocytes: 6 %
Neutrophils Absolute: 7.9 10*3/uL — ABNORMAL HIGH (ref 1.4–7.0)
Neutrophils: 51 %
Platelets: 507 10*3/uL — ABNORMAL HIGH (ref 150–450)
RBC: 4.29 x10E6/uL (ref 3.77–5.28)
RDW: 12.4 % (ref 11.7–15.4)
WBC: 15.2 10*3/uL — ABNORMAL HIGH (ref 3.4–10.8)

## 2022-08-10 LAB — CMP14+EGFR
ALT: 9 IU/L (ref 0–32)
AST: 8 IU/L (ref 0–40)
Albumin/Globulin Ratio: 1.9 (ref 1.2–2.2)
Albumin: 4.1 g/dL (ref 3.9–4.9)
Alkaline Phosphatase: 107 IU/L (ref 44–121)
BUN/Creatinine Ratio: 11 (ref 9–23)
BUN: 9 mg/dL (ref 6–20)
Bilirubin Total: 0.4 mg/dL (ref 0.0–1.2)
CO2: 23 mmol/L (ref 20–29)
Calcium: 9.5 mg/dL (ref 8.7–10.2)
Chloride: 101 mmol/L (ref 96–106)
Creatinine, Ser: 0.85 mg/dL (ref 0.57–1.00)
Globulin, Total: 2.2 g/dL (ref 1.5–4.5)
Glucose: 91 mg/dL (ref 70–99)
Potassium: 3.5 mmol/L (ref 3.5–5.2)
Sodium: 143 mmol/L (ref 134–144)
Total Protein: 6.3 g/dL (ref 6.0–8.5)
eGFR: 94 mL/min/{1.73_m2} (ref >59)

## 2022-08-10 LAB — HEMOGLOBIN A1C
Est. average glucose Bld gHb Est-mCnc: 103 mg/dL
Hgb A1c MFr Bld: 5.2 % (ref 4.8–5.6)

## 2022-08-10 LAB — TSH+FREE T4
Free T4: 1.41 ng/dL (ref 0.82–1.77)
TSH: 2.02 u[IU]/mL (ref 0.450–4.500)

## 2022-08-10 LAB — LIPID PANEL
Chol/HDL Ratio: 3.8 ratio (ref 0.0–4.4)
Cholesterol, Total: 170 mg/dL (ref 100–199)
HDL: 45 mg/dL (ref >39)
LDL Chol Calc (NIH): 110 mg/dL — ABNORMAL HIGH (ref 0–99)
Triglycerides: 77 mg/dL (ref 0–149)
VLDL Cholesterol Cal: 15 mg/dL (ref 5–40)

## 2022-08-10 LAB — VITAMIN D 25 HYDROXY (VIT D DEFICIENCY, FRACTURES): Vit D, 25-Hydroxy: 31.3 ng/mL (ref 30.0–100.0)

## 2022-08-14 DIAGNOSIS — Z6830 Body mass index (BMI) 30.0-30.9, adult: Secondary | ICD-10-CM | POA: Diagnosis not present

## 2022-08-14 DIAGNOSIS — R030 Elevated blood-pressure reading, without diagnosis of hypertension: Secondary | ICD-10-CM | POA: Diagnosis not present

## 2022-08-14 DIAGNOSIS — N760 Acute vaginitis: Secondary | ICD-10-CM | POA: Diagnosis not present

## 2022-08-14 DIAGNOSIS — E669 Obesity, unspecified: Secondary | ICD-10-CM | POA: Diagnosis not present

## 2022-08-14 DIAGNOSIS — R350 Frequency of micturition: Secondary | ICD-10-CM | POA: Diagnosis not present

## 2022-08-15 NOTE — Progress Notes (Signed)
Please inform the patient that her LDL is slightly elevated.  I want her LDL to be less than 100.  I recommend a low-carb and fat diet with increased physical activity.  Her hemoglobin A1c and vitamin D levels are stable.  Her kidneys and liver function are stable.

## 2022-08-27 ENCOUNTER — Other Ambulatory Visit: Payer: Self-pay

## 2022-08-27 ENCOUNTER — Encounter (HOSPITAL_COMMUNITY): Payer: Self-pay

## 2022-08-27 ENCOUNTER — Emergency Department (HOSPITAL_COMMUNITY): Payer: Medicaid Other

## 2022-08-27 ENCOUNTER — Inpatient Hospital Stay (HOSPITAL_COMMUNITY)
Admission: EM | Admit: 2022-08-27 | Discharge: 2022-08-29 | DRG: 202 | Disposition: A | Discharge status: Home / Self Care | Payer: Medicaid Other | Attending: Internal Medicine | Admitting: Internal Medicine

## 2022-08-27 DIAGNOSIS — J9601 Acute respiratory failure with hypoxia: Secondary | ICD-10-CM | POA: Diagnosis not present

## 2022-08-27 DIAGNOSIS — K219 Gastro-esophageal reflux disease without esophagitis: Secondary | ICD-10-CM | POA: Diagnosis present

## 2022-08-27 DIAGNOSIS — Z1152 Encounter for screening for COVID-19: Secondary | ICD-10-CM

## 2022-08-27 DIAGNOSIS — E669 Obesity, unspecified: Secondary | ICD-10-CM | POA: Diagnosis not present

## 2022-08-27 DIAGNOSIS — J189 Pneumonia, unspecified organism: Secondary | ICD-10-CM | POA: Diagnosis present

## 2022-08-27 DIAGNOSIS — F32A Depression, unspecified: Secondary | ICD-10-CM | POA: Diagnosis present

## 2022-08-27 DIAGNOSIS — R0600 Dyspnea, unspecified: Secondary | ICD-10-CM | POA: Diagnosis not present

## 2022-08-27 DIAGNOSIS — R059 Cough, unspecified: Secondary | ICD-10-CM

## 2022-08-27 DIAGNOSIS — J45901 Unspecified asthma with (acute) exacerbation: Principal | ICD-10-CM | POA: Diagnosis present

## 2022-08-27 DIAGNOSIS — N760 Acute vaginitis: Secondary | ICD-10-CM | POA: Diagnosis present

## 2022-08-27 DIAGNOSIS — Z7951 Long term (current) use of inhaled steroids: Secondary | ICD-10-CM

## 2022-08-27 DIAGNOSIS — Z818 Family history of other mental and behavioral disorders: Secondary | ICD-10-CM | POA: Diagnosis not present

## 2022-08-27 DIAGNOSIS — F17210 Nicotine dependence, cigarettes, uncomplicated: Secondary | ICD-10-CM | POA: Diagnosis not present

## 2022-08-27 DIAGNOSIS — Z91040 Latex allergy status: Secondary | ICD-10-CM | POA: Diagnosis not present

## 2022-08-27 DIAGNOSIS — E876 Hypokalemia: Secondary | ICD-10-CM | POA: Diagnosis not present

## 2022-08-27 DIAGNOSIS — Z888 Allergy status to other drugs, medicaments and biological substances status: Secondary | ICD-10-CM | POA: Diagnosis not present

## 2022-08-27 DIAGNOSIS — X58XXXA Exposure to other specified factors, initial encounter: Secondary | ICD-10-CM | POA: Diagnosis present

## 2022-08-27 DIAGNOSIS — Z79891 Long term (current) use of opiate analgesic: Secondary | ICD-10-CM | POA: Diagnosis not present

## 2022-08-27 DIAGNOSIS — Z720 Tobacco use: Secondary | ICD-10-CM | POA: Diagnosis present

## 2022-08-27 DIAGNOSIS — Z9049 Acquired absence of other specified parts of digestive tract: Secondary | ICD-10-CM

## 2022-08-27 DIAGNOSIS — Z716 Tobacco abuse counseling: Secondary | ICD-10-CM

## 2022-08-27 DIAGNOSIS — F419 Anxiety disorder, unspecified: Secondary | ICD-10-CM | POA: Diagnosis present

## 2022-08-27 DIAGNOSIS — R6883 Chills (without fever): Secondary | ICD-10-CM | POA: Diagnosis not present

## 2022-08-27 DIAGNOSIS — E871 Hypo-osmolality and hyponatremia: Secondary | ICD-10-CM | POA: Diagnosis present

## 2022-08-27 DIAGNOSIS — S27392A Other injuries of lung, bilateral, initial encounter: Secondary | ICD-10-CM | POA: Diagnosis present

## 2022-08-27 DIAGNOSIS — Z6830 Body mass index (BMI) 30.0-30.9, adult: Secondary | ICD-10-CM

## 2022-08-27 DIAGNOSIS — R0602 Shortness of breath: Secondary | ICD-10-CM | POA: Diagnosis not present

## 2022-08-27 DIAGNOSIS — Z23 Encounter for immunization: Secondary | ICD-10-CM | POA: Diagnosis not present

## 2022-08-27 DIAGNOSIS — E860 Dehydration: Secondary | ICD-10-CM | POA: Diagnosis not present

## 2022-08-27 DIAGNOSIS — Z8614 Personal history of Methicillin resistant Staphylococcus aureus infection: Secondary | ICD-10-CM | POA: Diagnosis not present

## 2022-08-27 DIAGNOSIS — J45998 Other asthma: Secondary | ICD-10-CM | POA: Diagnosis not present

## 2022-08-27 LAB — BASIC METABOLIC PANEL
Anion gap: 10 (ref 5–15)
BUN: 7 mg/dL (ref 6–20)
CO2: 25 mmol/L (ref 22–32)
Calcium: 9 mg/dL (ref 8.9–10.3)
Chloride: 99 mmol/L (ref 98–111)
Creatinine, Ser: 0.64 mg/dL (ref 0.44–1.00)
GFR, Estimated: >60 mL/min (ref >60)
Glucose, Bld: 115 mg/dL — ABNORMAL HIGH (ref 70–99)
Potassium: 3.4 mmol/L — ABNORMAL LOW (ref 3.5–5.1)
Sodium: 134 mmol/L — ABNORMAL LOW (ref 135–145)

## 2022-08-27 LAB — URINALYSIS, ROUTINE W REFLEX MICROSCOPIC
Bilirubin Urine: NEGATIVE
Glucose, UA: >=500 mg/dL — AB
Ketones, ur: 5 mg/dL — AB
Leukocytes,Ua: NEGATIVE
Nitrite: NEGATIVE
Protein, ur: NEGATIVE mg/dL
Specific Gravity, Urine: 1.031 — ABNORMAL HIGH (ref 1.005–1.030)
pH: 5 (ref 5.0–8.0)

## 2022-08-27 LAB — CBC WITH DIFFERENTIAL/PLATELET
Abs Immature Granulocytes: 0.05 10*3/uL (ref 0.00–0.07)
Basophils Absolute: 0.1 10*3/uL (ref 0.0–0.1)
Basophils Relative: 0 %
Eosinophils Absolute: 0.3 10*3/uL (ref 0.0–0.5)
Eosinophils Relative: 2 %
HCT: 34.8 % — ABNORMAL LOW (ref 36.0–46.0)
Hemoglobin: 11.6 g/dL — ABNORMAL LOW (ref 12.0–15.0)
Immature Granulocytes: 0 %
Lymphocytes Relative: 11 %
Lymphs Abs: 1.4 10*3/uL (ref 0.7–4.0)
MCH: 30.9 pg (ref 26.0–34.0)
MCHC: 33.3 g/dL (ref 30.0–36.0)
MCV: 92.8 fL (ref 80.0–100.0)
Monocytes Absolute: 0.5 10*3/uL (ref 0.1–1.0)
Monocytes Relative: 4 %
Neutro Abs: 9.7 10*3/uL — ABNORMAL HIGH (ref 1.7–7.7)
Neutrophils Relative %: 83 %
Platelets: 497 10*3/uL — ABNORMAL HIGH (ref 150–400)
RBC: 3.75 MIL/uL — ABNORMAL LOW (ref 3.87–5.11)
RDW: 14.6 % (ref 11.5–15.5)
WBC: 12 10*3/uL — ABNORMAL HIGH (ref 4.0–10.5)
nRBC: 0 % (ref 0.0–0.2)

## 2022-08-27 LAB — RESP PANEL BY RT-PCR (RSV, FLU A&B, COVID)  RVPGX2
Influenza A by PCR: NEGATIVE
Influenza B by PCR: NEGATIVE
Resp Syncytial Virus by PCR: NEGATIVE
SARS Coronavirus 2 by RT PCR: NEGATIVE

## 2022-08-27 LAB — D-DIMER, QUANTITATIVE: D-Dimer, Quant: 0.65 ug/mL-FEU — ABNORMAL HIGH (ref 0.00–0.50)

## 2022-08-27 LAB — GROUP A STREP BY PCR: Group A Strep by PCR: NOT DETECTED

## 2022-08-27 LAB — PREGNANCY, URINE: Preg Test, Ur: NEGATIVE

## 2022-08-27 MED ORDER — ACETAMINOPHEN 325 MG PO TABS
650.0000 mg | ORAL_TABLET | Freq: Four times a day (QID) | ORAL | Status: DC | PRN
  Administered 2022-08-28: 650 mg via ORAL
  Filled 2022-08-27: qty 2

## 2022-08-27 MED ORDER — PANTOPRAZOLE SODIUM 40 MG PO TBEC
40.0000 mg | DELAYED_RELEASE_TABLET | Freq: Every day | ORAL | Status: DC
  Administered 2022-08-27 – 2022-08-29 (×3): 40 mg via ORAL
  Filled 2022-08-27 (×3): qty 1

## 2022-08-27 MED ORDER — SODIUM CHLORIDE 0.9 % IV SOLN
2.0000 g | INTRAVENOUS | Status: DC
  Administered 2022-08-28 – 2022-08-29 (×2): 2 g via INTRAVENOUS
  Filled 2022-08-27 (×2): qty 20

## 2022-08-27 MED ORDER — IPRATROPIUM-ALBUTEROL 0.5-2.5 (3) MG/3ML IN SOLN
3.0000 mL | RESPIRATORY_TRACT | Status: DC
  Administered 2022-08-27 – 2022-08-28 (×7): 3 mL via RESPIRATORY_TRACT
  Filled 2022-08-27 (×7): qty 3

## 2022-08-27 MED ORDER — NICOTINE 21 MG/24HR TD PT24
21.0000 mg | MEDICATED_PATCH | Freq: Every day | TRANSDERMAL | Status: DC
  Administered 2022-08-27 – 2022-08-29 (×3): 21 mg via TRANSDERMAL
  Filled 2022-08-27 (×3): qty 1

## 2022-08-27 MED ORDER — ALBUTEROL (5 MG/ML) CONTINUOUS INHALATION SOLN
10.0000 mg/h | INHALATION_SOLUTION | Freq: Once | RESPIRATORY_TRACT | Status: AC
  Administered 2022-08-27: 10 mg/h via RESPIRATORY_TRACT
  Filled 2022-08-27: qty 20

## 2022-08-27 MED ORDER — METHYLPREDNISOLONE SODIUM SUCC 125 MG IJ SOLR
80.0000 mg | Freq: Two times a day (BID) | INTRAMUSCULAR | Status: DC
  Administered 2022-08-27 – 2022-08-29 (×4): 80 mg via INTRAVENOUS
  Filled 2022-08-27 (×4): qty 2

## 2022-08-27 MED ORDER — OXYCODONE HCL 5 MG PO TABS: 5.0000 mg | ORAL_TABLET | ORAL | Status: DC | PRN

## 2022-08-27 MED ORDER — AZITHROMYCIN 250 MG PO TABS: 500.0000 mg | ORAL_TABLET | Freq: Once | ORAL | Status: DC

## 2022-08-27 MED ORDER — SODIUM CHLORIDE 0.9 % IV BOLUS
1000.0000 mL | Freq: Once | INTRAVENOUS | Status: AC
  Administered 2022-08-27: 1000 mL via INTRAVENOUS

## 2022-08-27 MED ORDER — SODIUM CHLORIDE 0.9 % IV SOLN: 500.0000 mg | INTRAVENOUS | Status: DC

## 2022-08-27 MED ORDER — IPRATROPIUM BROMIDE 0.02 % IN SOLN
0.5000 mg | Freq: Once | RESPIRATORY_TRACT | Status: AC
  Administered 2022-08-27: 0.5 mg via RESPIRATORY_TRACT
  Filled 2022-08-27: qty 2.5

## 2022-08-27 MED ORDER — ALBUTEROL SULFATE (2.5 MG/3ML) 0.083% IN NEBU: 2.5000 mg | INHALATION_SOLUTION | RESPIRATORY_TRACT | Status: DC | PRN

## 2022-08-27 MED ORDER — AZITHROMYCIN 250 MG PO TABS
500.0000 mg | ORAL_TABLET | Freq: Once | ORAL | Status: AC
  Administered 2022-08-27: 500 mg via ORAL
  Filled 2022-08-27: qty 2

## 2022-08-27 MED ORDER — SODIUM CHLORIDE 0.9 % IV SOLN
500.0000 mg | INTRAVENOUS | Status: DC
  Administered 2022-08-28 – 2022-08-29 (×2): 500 mg via INTRAVENOUS
  Filled 2022-08-27 (×2): qty 5

## 2022-08-27 MED ORDER — NICOTINE 21 MG/24HR TD PT24: 21.0000 mg | MEDICATED_PATCH | Freq: Every day | TRANSDERMAL | Status: DC

## 2022-08-27 MED ORDER — SODIUM CHLORIDE 0.9 % IV SOLN
2.0000 g | Freq: Once | INTRAVENOUS | Status: AC
  Administered 2022-08-27: 2 g via INTRAVENOUS
  Filled 2022-08-27: qty 20

## 2022-08-27 MED ORDER — ENOXAPARIN SODIUM 40 MG/0.4ML IJ SOSY
40.0000 mg | PREFILLED_SYRINGE | INTRAMUSCULAR | Status: DC
  Administered 2022-08-27 – 2022-08-28 (×2): 40 mg via SUBCUTANEOUS
  Filled 2022-08-27 (×2): qty 0.4

## 2022-08-27 MED ORDER — SODIUM CHLORIDE 0.9 % IV SOLN: INTRAVENOUS | Status: DC

## 2022-08-27 MED ORDER — IPRATROPIUM-ALBUTEROL 0.5-2.5 (3) MG/3ML IN SOLN: 3.0000 mL | RESPIRATORY_TRACT | Status: DC

## 2022-08-27 MED ORDER — MAGNESIUM SULFATE 2 GM/50ML IV SOLN
2.0000 g | Freq: Once | INTRAVENOUS | Status: AC
  Administered 2022-08-27: 2 g via INTRAVENOUS
  Filled 2022-08-27: qty 50

## 2022-08-27 MED ORDER — METHYLPREDNISOLONE SODIUM SUCC 125 MG IJ SOLR
125.0000 mg | Freq: Once | INTRAMUSCULAR | Status: AC
  Administered 2022-08-27: 125 mg via INTRAVENOUS
  Filled 2022-08-27: qty 2

## 2022-08-27 MED ORDER — SODIUM CHLORIDE 0.9 % IV SOLN: 2.0000 g | Freq: Once | INTRAVENOUS | Status: DC

## 2022-08-27 MED ORDER — VORTIOXETINE HBR 20 MG PO TABS
20.0000 mg | ORAL_TABLET | Freq: Every day | ORAL | Status: DC
  Administered 2022-08-29: 20 mg via ORAL
  Filled 2022-08-27 (×3): qty 1

## 2022-08-27 MED ORDER — ACETAMINOPHEN 650 MG RE SUPP: 650.0000 mg | Freq: Four times a day (QID) | RECTAL | Status: DC | PRN

## 2022-08-27 NOTE — ED Notes (Signed)
Pt encouraged to deep breath and attempt to cough phelm up to improve oxygen saturations.

## 2022-08-27 NOTE — ED Notes (Signed)
Pt has been given a meal tray and ginger ale to drink

## 2022-08-27 NOTE — ED Triage Notes (Signed)
Pt presents with productive cough, ShOB, chills, sore throat, and body aches x 4 days. Pt with hx of asthma.  

## 2022-08-27 NOTE — ED Notes (Signed)
Nurse asked pt if she was able to urinate and pt stated she needed more drink to be able to urinate. Nurse gave pt more drink.

## 2022-08-27 NOTE — ED Provider Notes (Signed)
[facility] Provider Note   CSN: [id number] Arrival date & time: 08/27/22  1213     History  Chief Complaint  Patient presents with   Shortness of Breath        HPI Karen Shields is a 31 y.o. female with GERD and asthma presenting with shortness of breath.  Started Friday and has progressively worsened.  She breath is worse with exertion.  Also endorses associated chest pain.  Chest pain is midsternal and nonradiating.  It is worse with coughing and bending over.  Endorsing cough that is productive with yellowish-green sputum.  Unknown if she is having a fever but endorses chills.  She has been using her asthma inhaler 3 times a day since Friday.  Denies sick contacts at home. Denies calf tenderness, use of OCPs and long trips.  Patient did state that she has been using "rush" which is a sexual arousal inhalant that she bought at the gas station.  She has been using this frequently for the last couple months.  Stated that on the bottle has warnings of being toxic to the lungs.   Shortness of Breath      Home Medications Prior to Admission medications   Medication Sig Start Date End Date Taking? Authorizing Provider  ALPRAZolam Prudy Feeler(XANAX) 1 MG tablet Take 1 tablet (1 mg total) by mouth daily as needed for anxiety. 06/21/22  Yes Myrlene Brokeross, Deborah R, MD  buprenorphine-naloxone (SUBOXONE) 8-2 mg SUBL SL tablet Place 1 tablet under the tongue 2 (two) times daily. 01/19/22  Yes [provider]  cetirizine (ZYRTEC) 10 MG tablet Take 10 mg by mouth daily as needed.   Yes [provider]  Cholecalciferol (VITAMIN D3) 5000 units CAPS Take by mouth.   Yes [provider]  nystatin cream (MYCOSTATIN) Apply topically 2 (two) times daily. 08/09/22  Yes [provider]  nystatin-triamcinolone ointment (MYCOLOG) Apply 1 Application topically 2 (two) times daily. 08/09/22  Yes Gilmore LarocheZarwolo, Gloria, FNP  phenazopyridine (PYRIDIUM) 100 MG tablet Take 100 mg  by mouth 3 (three) times daily as needed. 08/14/22  Yes [provider]  VENTOLIN HFA 108 (90 Base) MCG/ACT inhaler INHALE TWO PUFFS INTO LUNGS FOUR TIMES DAILY Patient taking differently: Inhale 2 puffs into the lungs every 6 (six) hours as needed for wheezing or shortness of breath. 07/02/22  Yes Gilmore LarocheZarwolo, Gloria, FNP  vortioxetine HBr (TRINTELLIX) 20 MG TABS tablet Take 1 tablet (20 mg total) by mouth daily. 06/21/22  Yes Myrlene Brokeross, Deborah R, MD  benzonatate (TESSALON) 100 MG capsule Take 1 capsule (100 mg total) by mouth 3 (three) times daily as needed for cough. Do not take with alcohol or while driving or operating heavy machinery.  May cause drowsiness. Patient not taking: Reported on 08/05/2022 07/20/22   Valentino NoseMartinez, Jessica A, NP  budesonide-formoterol [facility](SYMBICORT) 80-4.5 MCG/ACT inhaler Inhale 2 puffs into the lungs 2 (two) times daily. Patient not taking: Reported on 08/27/2022 08/09/22   Gilmore LarocheZarwolo, Gloria, FNP      Allergies    Aloe and Latex    Review of Systems   Review of Systems  Respiratory:  Positive for shortness of breath.     Physical Exam Updated Vital Signs BP (!) 103/51 (BP Location: Right Arm)   Pulse 78   Temp 98 F (36.7 C) (Oral)   Resp 16   Ht 5\' 4"  (1.626 m)   Wt 80.4 kg   LMP 08/05/2022 (Approximate)   SpO2 93%   BMI 30.42 kg/m  Physical Exam Vitals reviewed.  HENT:     Head: Normocephalic and atraumatic.     Mouth/Throat:     Mouth: Mucous membranes are moist.  Eyes:     General:        Right eye: No discharge.        Left eye: No discharge.     Conjunctiva/sclera: Conjunctivae normal.  Cardiovascular:     Rate and Rhythm: Normal rate and regular rhythm.     Pulses: Normal pulses.     Heart sounds: Normal heart sounds.  Pulmonary:     Effort: Pulmonary effort is normal. Tachypnea present.     Breath sounds: Wheezing and rhonchi present. No decreased breath sounds or rales.  Abdominal:     General: Abdomen is flat.     Palpations: Abdomen  is soft.  Skin:    General: Skin is warm and dry.     Coloration: Skin is pale.  Neurological:     General: No focal deficit present.  Psychiatric:        Mood and Affect: Mood normal.     ED Results / Procedures / Treatments   Labs (all labs ordered are listed, but only abnormal results are displayed) Labs Reviewed  URINALYSIS, ROUTINE W REFLEX MICROSCOPIC - Abnormal; Notable for the following components:      Result Value   APPearance HAZY (*)    Specific Gravity, Urine 1.031 (*)    Glucose, UA >=500 (*)    Hgb urine dipstick SMALL (*)    Ketones, ur 5 (*)    Bacteria, UA RARE (*)    All other components within normal limits  BASIC METABOLIC PANEL - Abnormal; Notable for the following components:   Sodium 134 (*)    Potassium 3.4 (*)    Glucose, Bld 115 (*)    All other components within normal limits  CBC WITH DIFFERENTIAL/PLATELET - Abnormal; Notable for the following components:   WBC 12.0 (*)    RBC 3.75 (*)    Hemoglobin 11.6 (*)    HCT 34.8 (*)    Platelets 497 (*)    Neutro Abs 9.7 (*)    All other components within normal limits  D-DIMER, QUANTITATIVE - Abnormal; Notable for the following components:   D-Dimer, Quant 0.65 (*)    All other components within normal limits  BASIC METABOLIC PANEL - Abnormal; Notable for the following components:   Glucose, Bld 202 (*)    Calcium 8.6 (*)    Anion gap 4 (*)    All other components within normal limits  CBC - Abnormal; Notable for the following components:   WBC 20.8 (*)    RBC 3.29 (*)    Hemoglobin 10.1 (*)    HCT 31.0 (*)    Platelets 511 (*)    All other components within normal limits  RESP PANEL BY RT-PCR (RSV, FLU A&B, COVID)  [id number]  GROUP A STREP BY PCR  EXPECTORATED SPUTUM ASSESSMENT W GRAM STAIN, RFLX TO RESP C  PREGNANCY, URINE  STREP PNEUMONIAE URINARY ANTIGEN  LEGIONELLA PNEUMOPHILA SEROGP 1 UR AG  HIV ANTIBODY (ROUTINE TESTING W REFLEX)    EKG None  Radiology DG Chest Port 1  View  Result Date: 08/27/2022 CLINICAL DATA:  Shortness of breath. Productive cough. Chills. Body aches. EXAM: PORTABLE CHEST 1 VIEW COMPARISON:  07/28/2020 FINDINGS: Heart size is normal. Mediastinal shadows are normal. There is bronchial thickening. Minimal patchy density evident the right lung base consistent with minimal pneumonia. No  lobar consolidation or collapse. No effusion. No acute bone finding. IMPRESSION: Bronchitis pattern. Minimal patchy pneumonia at the right lung base. Electronically Signed   By: Paulina Fusi M.D.   On: 08/27/2022 13:01    Procedures Procedures    Medications Ordered in ED Medications  0.9 %  sodium chloride infusion ( Intravenous New Bag/Given 08/27/22 1836)  enoxaparin (LOVENOX) injection 40 mg (40 mg Subcutaneous Given 08/27/22 2234)  acetaminophen (TYLENOL) tablet 650 mg (has no administration in time range)    Or  acetaminophen (TYLENOL) suppository 650 mg (has no administration in time range)  oxyCODONE (Oxy IR/ROXICODONE) immediate release tablet 5 mg (has no administration in time range)  methylPREDNISolone sodium succinate (SOLU-MEDROL) 125 mg/2 mL injection 80 mg (80 mg Intravenous Given 08/27/22 2233)  pantoprazole (PROTONIX) EC tablet 40 mg (40 mg Oral Given 08/27/22 1846)  albuterol (PROVENTIL) (2.5 MG/3ML) 0.083% nebulizer solution 2.5 mg (has no administration in time range)  cefTRIAXone (ROCEPHIN) 2 g in sodium chloride 0.9 % 100 mL IVPB (has no administration in time range)  ipratropium-albuterol (DUONEB) 0.5-2.5 (3) MG/3ML nebulizer solution 3 mL (3 mLs Nebulization Given 08/28/22 0418)  vortioxetine HBr (TRINTELLIX) tablet 20 mg (has no administration in time range)  azithromycin (ZITHROMAX) 500 mg in sodium chloride 0.9 % 250 mL IVPB (has no administration in time range)  nicotine (NICODERM CQ - dosed in mg/24 hours) patch 21 mg (21 mg Transdermal Patch Applied 08/27/22 2233)  albuterol (PROVENTIL,VENTOLIN) solution continuous neb (10 mg/hr  Nebulization Given 08/27/22 1327)  ipratropium (ATROVENT) nebulizer solution 0.5 mg (0.5 mg Nebulization Given 08/27/22 1327)  magnesium sulfate IVPB 2 g 50 mL (0 g Intravenous Stopped 08/27/22 1449)  methylPREDNISolone sodium succinate (SOLU-MEDROL) 125 mg/2 mL injection 125 mg (125 mg Intravenous Given 08/27/22 1320)  cefTRIAXone (ROCEPHIN) 2 g in sodium chloride 0.9 % 100 mL IVPB (0 g Intravenous Stopped 08/27/22 1624)  azithromycin (ZITHROMAX) tablet 500 mg (500 mg Oral Given 08/27/22 1510)  sodium chloride 0.9 % bolus 1,000 mL (1,000 mLs Intravenous New Bag/Given 08/27/22 1836)    ED Course/ Medical Decision Making/ A&P                           Medical Decision Making Amount and/or Complexity of Data Reviewed Labs: ordered. Radiology: ordered.  Risk Prescription drug management. Decision regarding hospitalization.   Initial Impression and Ddx 31 year old female in respiratory distress presenting for shortness of breath.  Physical exam noted for diffuse wheezing, pale and tachycardia.  Differential diagnosis includes asthma exacerbation, pneumonia, toxic inhalant, and PE. Patient PMH that increases complexity of ED encounter:    Interpretation of Diagnostics I independent reviewed and interpreted the labs as followed: leukocytosis, anemia, hyperglycemia, elevated d-dimer  - I independently visualized the following imaging with scope of interpretation limited to determining acute life threatening conditions related to emergency care: CXR, which revealed patchy pneumonia in right lung base  - I independtly reviewed and interpreted EKG which revealed sinus tachycardia  Patient Reassessment and Ultimate Disposition/Management After initial assessment, primary concern was asthma exacerbation. Treated with albuterol, steroids, ipratropium and magnesium. CXR along with elevated WBC and persistent productive cough at home concerning for pneumonia. Treated with azithromycin and rocephin.  Patient stated that she was feeling better but continued hypoxic, added 4L [place] and sats improved. Attempted to ambulate around department and patient was hypoxic with tachypnea. At this point, admitted for increased oxygen requirement. Also could not rule out PE given persistence  of sinus tachycardia and hypoxia. D dimer was elevated. Signed out patient to hospital service who will continue to manage.  Patient management required discussion with the following services or consulting groups:  Hospitalist Service  Complexity of Problems Addressed Acute complicated illness or Injury  Additional Data Reviewed and Analyzed Further history obtained from: Further history from spouse/family member, Prior ED visit notes, and Recent PCP notes  Patient Encounter Risk Assessment Prescriptions and Consideration of hospitalization         Final Clinical Impression(s) / ED Diagnoses Final diagnoses:  Exacerbation of asthma, unspecified asthma severity, unspecified whether persistent  Cough, unspecified type    Rx / DC Orders ED Discharge Orders     None         Gareth Eagle, PA-C 08/28/22 [id number]    Vanetta Mulders, MD 09/04/22 1721

## 2022-08-27 NOTE — H&P (Signed)
TRH H&P   Patient Demographics:    Karen Shields, is a 31 y.o. female  MRN: [id number]   DOB - September 16, 1991  Admit Date - 08/27/2022  Outpatient Primary MD for the patient is Gilmore Laroche, FNP  Referring MD/NP/PA: PA Roxan Hockey  Patient coming from: home  Chief Complaint  Patient presents with   Shortness of Breath           HPI:    Karen Shields  is a 31 y.o. female, with past medical history of GERD, asthma, presents to ED secondary to shortness of breath, reported started Friday, has been progressively worsening, worse with activity, she does report some chest pain as well, midsternal, nonradiating, related to coughing, does report productive yellow-green phlegm, no fever, no chills, but she was recently started on asthma inhalers, despite history of asthma reports she was never hospitalized or intubated for her asthma, leg swelling, calf tenderness, denies use of OCP, or any long trips.  She reports she has been using a product "rush", which is a sexual arousal and inhalant that she buys at the gas stations, reports this liquid where she snorts all liquid, initially reports last was couple months, upon further questioning reports this may be a week ago, stated that on the bottle as warning of being toxic to the lungs. -ED patient was hypoxic, when she ambulated her oxygen dropped to 83% on room air, she is currently requiring 4 L nasal cannula to maintain 89% on room air, chest x-ray with bronchitic changes, right basilar opacity, dyspnea and wheezing has improved with steroids, but she remains with significant oxygen requirement, so [facility] consulted to admit.    Review of systems:   A full 10 point Review of Systems was done, except as stated above, all other Review of Systems were negative.   With Past History of the following :    Past Medical History:   Diagnosis Date   Anxiety    Asthma    WELL CONTROLLED   Asthma    Depression    pt stated   Eczema 2008   Gallstones    GERD (gastroesophageal reflux disease)    Headache    Hx MRSA infection 09-2014   IBS (irritable bowel syndrome)    Ovarian cyst       Past Surgical History:  Procedure Laterality Date   CHOLECYSTECTOMY N/A 01/16/2016   Procedure: LAPAROSCOPIC CHOLECYSTECTOMY;  Surgeon: Lattie Haw, MD;  Location: [facility] ORS;  Service: General;  Laterality: N/A;   ESOPHAGOGASTRODUODENOSCOPY N/A 11/13/2015   CWU:GQBVQXI reflux    OVARIAN CYST REMOVAL     OVARIAN CYST REMOVAL  2011   TUBAL LIGATION     WISDOM TOOTH EXTRACTION        Social History:     Social History   Tobacco Use   Smoking status: Every Day    Packs/day: 0.75  Years: 5.00    Total pack years: 3.75    Types: Cigarettes    Start date: 02/11/2006   Smokeless tobacco: Never  Substance Use Topics   Alcohol use: Yes    Alcohol/week: 0.0 standard drinks of alcohol    Comment: rarely, maybe once a year       Family History :     Family History  Problem Relation Age of Onset   Migraines Mother    Diabetes Mother    Anxiety disorder Mother    Depression Mother    Depression Maternal Grandmother    Stroke Maternal Grandfather    Dementia Paternal Grandfather    Colon cancer Neg Hx    Crohn's disease Neg Hx    Celiac disease Neg Hx       Home Medications:   Prior to Admission medications   Medication Sig Start Date End Date Taking? Authorizing Provider  ALPRAZolam Prudy Feeler) 1 MG tablet Take 1 tablet (1 mg total) by mouth daily as needed for anxiety. 06/21/22  Yes Myrlene Broker, MD  buprenorphine-naloxone (SUBOXONE) 8-2 mg SUBL SL tablet Place 1 tablet under the tongue 2 (two) times daily. 01/19/22  Yes [provider]  cetirizine (ZYRTEC) 10 MG tablet Take 10 mg by mouth daily as needed.   Yes [provider]  Cholecalciferol (VITAMIN D3) 5000 units CAPS Take by mouth.    Yes [provider]  nystatin-triamcinolone ointment (MYCOLOG) Apply 1 Application topically 2 (two) times daily. 08/09/22  Yes Gilmore Laroche, FNP  VENTOLIN HFA 108 (90 Base) MCG/ACT inhaler INHALE TWO PUFFS INTO LUNGS FOUR TIMES DAILY Patient taking differently: Inhale 2 puffs into the lungs every 6 (six) hours as needed for wheezing or shortness of breath. 07/02/22  Yes Gilmore Laroche, FNP  vortioxetine HBr (TRINTELLIX) 20 MG TABS tablet Take 1 tablet (20 mg total) by mouth daily. 06/21/22  Yes Myrlene Broker, MD  benzonatate (TESSALON) 100 MG capsule Take 1 capsule (100 mg total) by mouth 3 (three) times daily as needed for cough. Do not take with alcohol or while driving or operating heavy machinery.  May cause drowsiness. Patient not taking: Reported on 08/05/2022 07/20/22   Valentino Nose, NP  budesonide-formoterol [facility]) 80-4.5 MCG/ACT inhaler Inhale 2 puffs into the lungs 2 (two) times daily. Patient not taking: Reported on 08/27/2022 08/09/22   Gilmore Laroche, FNP  methylPREDNISolone (MEDROL DOSEPAK) 4 MG TBPK tablet Take as the package instructed Patient not taking: Reported on 08/27/2022 08/05/22   Gilmore Laroche, FNP  ondansetron (ZOFRAN-ODT) 8 MG disintegrating tablet Take 1 tablet (8 mg total) by mouth every 8 (eight) hours as needed for nausea or vomiting. Patient not taking: Reported on 08/27/2022 11/03/21   Rhys Martini, PA-C     Allergies:     Allergies  Allergen Reactions   Aloe Swelling   Latex Dermatitis     Physical Exam:   Vitals  Blood pressure (!) 129/58, pulse (!) 110, temperature 98.9 F (37.2 C), temperature source Oral, resp. rate 19, height 5\' 4"  (1.626 m), weight 78.9 kg, last menstrual period 08/05/2022, SpO2 94 %.   1. General well developed female, laying in bed, no apparent distress  2. Normal affect and insight, Not Suicidal or Homicidal, Awake Alert, Oriented X 3.  3. No F.N deficits, ALL C.Nerves Intact, Strength 5/5 all  4 extremities, Sensation intact all 4 extremities, Plantars down going.  4. Ears and Eyes appear Normal, Conjunctivae clear, PERRLA. Moist Oral Mucosa.  5.  Supple Neck, No JVD, No cervical lymphadenopathy appriciated, No Carotid Bruits.  6. Symmetrical Chest wall movement, with diffuse bilateral rales and rhonchi.  7. RRR, No Gallops, Rubs or Murmurs, No Parasternal Heave.  8. Positive Bowel Sounds, Abdomen Soft, No tenderness, No organomegaly appriciated,No rebound -guarding or rigidity.  9.  No Cyanosis, Normal Skin Turgor, No Skin Rash or Bruise.  10. Good muscle tone,  joints appear normal , no effusions, Normal ROM.     Data Review:    CBC Recent Labs  Lab 08/27/22 1320  WBC 12.0*  HGB 11.6*  HCT 34.8*  PLT 497*  MCV 92.8  MCH 30.9  MCHC 33.3  RDW 14.6  LYMPHSABS 1.4  MONOABS 0.5  EOSABS 0.3  BASOSABS 0.1   ------------------------------------------------------------------------------------------------------------------  Chemistries  Recent Labs  Lab 08/27/22 1320  NA 134*  K 3.4*  CL 99  CO2 25  GLUCOSE 115*  BUN 7  CREATININE 0.64  CALCIUM 9.0   ------------------------------------------------------------------------------------------------------------------ estimated creatinine clearance is 103.6 mL/min (by C-G formula based on SCr of 0.64 mg/dL). ------------------------------------------------------------------------------------------------------------------ No results for input(s): "TSH", "T4TOTAL", "T3FREE", "THYROIDAB" in the last 72 hours.  Invalid input(s): "FREET3"  Coagulation profile No results for input(s): "INR", "PROTIME" in the last 168 hours. ------------------------------------------------------------------------------------------------------------------- Recent Labs    08/27/22 1627  DDIMER 0.65*   -------------------------------------------------------------------------------------------------------------------  Cardiac  Enzymes No results for input(s): "CKMB", "TROPONINI", "MYOGLOBIN" in the last 168 hours.  Invalid input(s): "CK" ------------------------------------------------------------------------------------------------------------------ No results found for: "BNP"   ---------------------------------------------------------------------------------------------------------------  Urinalysis    Component Value Date/Time   COLORURINE YELLOW 11/07/2021 0802   APPEARANCEUR CLEAR 11/07/2021 0802   LABSPEC 1.010 11/07/2021 0802   PHURINE 7.0 11/07/2021 0802   GLUCOSEU NEGATIVE 11/07/2021 0802   HGBUR NEGATIVE 11/07/2021 0802   BILIRUBINUR negative 07/20/2022 1546   BILIRUBINUR + 08/13/2015 1542   KETONESUR negative 07/20/2022 1546   KETONESUR NEGATIVE 11/07/2021 0802   PROTEINUR trace (A) 07/20/2022 1546   PROTEINUR NEGATIVE 11/07/2021 0802   UROBILINOGEN 0.2 07/20/2022 1546   UROBILINOGEN 0.2 08/22/2014 2158   NITRITE Negative 07/20/2022 1546   NITRITE NEGATIVE 11/07/2021 0802   LEUKOCYTESUR Negative 07/20/2022 1546   LEUKOCYTESUR NEGATIVE 11/07/2021 0802    ----------------------------------------------------------------------------------------------------------------   Imaging Results:    DG Chest Port 1 View  Result Date: 08/27/2022 CLINICAL DATA:  Shortness of breath. Productive cough. Chills. Body aches. EXAM: PORTABLE CHEST 1 VIEW COMPARISON:  07/28/2020 FINDINGS: Heart size is normal. Mediastinal shadows are normal. There is bronchial thickening. Minimal patchy density evident the right lung base consistent with minimal pneumonia. No lobar consolidation or collapse. No effusion. No acute bone finding. IMPRESSION: Bronchitis pattern. Minimal patchy pneumonia at the right lung base. Electronically Signed   By: Paulina Fusi M.D.   On: 08/27/2022 13:01       Assessment & Plan:    Principal Problem:   Asthma exacerbation Active Problems:   Anxiety   GERD (gastroesophageal reflux  disease)   Tobacco use   Respiratory failure with hypoxia Asthma exacerbation Toxic lung injury Questionable  pneumonia -82% to 85% on room air with activity, she is currently on 4 L nasal cannula -She will presentation and wheezing suspicious for asthma exacerbation, so she will be kept on IV Solu-Medrol, scheduled DuoNebs with as needed albuterol, as well she is having cough, productive with yellow phlegm, as well x-ray findings suspicious for pneumonia, so we will keep her on incentive spirometry and flutter valve, as well she does endorse noting substance from gas station  which had written substance possibly toxic to the lungs.   Anxiety  -continue with Xanax  Hypokalemia -Repleted, recheck in a.m.  Hyponatremia - Mild, will give IV normal saline overnight, recheck in a.m.   DVT Prophylaxis  Lovenox   AM Labs Ordered, also please review Full Orders  Family Communication: Admission, patients condition and plan of care including tests being ordered have been discussed with the patient and mother in law  who indicate understanding and agree with the plan and Code Status.  Code Status Full  Likely DC to  home  Condition GUARDED    Consults called: none    Admission status: inpatient     Time spent in minutes : 70 minutes   Huey Bienenstockawood Tessia Kassin M.D on 08/27/2022 at 5:16 PM   [facility] - Office  [phone]

## 2022-08-28 DIAGNOSIS — F419 Anxiety disorder, unspecified: Secondary | ICD-10-CM | POA: Diagnosis not present

## 2022-08-28 DIAGNOSIS — K219 Gastro-esophageal reflux disease without esophagitis: Secondary | ICD-10-CM

## 2022-08-28 DIAGNOSIS — J45901 Unspecified asthma with (acute) exacerbation: Secondary | ICD-10-CM | POA: Diagnosis not present

## 2022-08-28 LAB — STREP PNEUMONIAE URINARY ANTIGEN: Strep Pneumo Urinary Antigen: NEGATIVE

## 2022-08-28 LAB — BLOOD GAS, ARTERIAL
Acid-Base Excess: 4.9 mmol/L — ABNORMAL HIGH (ref 0.0–2.0)
Bicarbonate: 29.2 mmol/L — ABNORMAL HIGH (ref 20.0–28.0)
Drawn by: 22179
FIO2: 28 %
O2 Saturation: 91.7 %
Patient temperature: 36.7
pCO2 arterial: 40 mmHg (ref 32–48)
pH, Arterial: 7.46 — ABNORMAL HIGH (ref 7.35–7.45)
pO2, Arterial: 78 mmHg — ABNORMAL LOW (ref 83–108)

## 2022-08-28 LAB — BASIC METABOLIC PANEL
Anion gap: 4 — ABNORMAL LOW (ref 5–15)
BUN: 10 mg/dL (ref 6–20)
CO2: 25 mmol/L (ref 22–32)
Calcium: 8.6 mg/dL — ABNORMAL LOW (ref 8.9–10.3)
Chloride: 109 mmol/L (ref 98–111)
Creatinine, Ser: 0.71 mg/dL (ref 0.44–1.00)
GFR, Estimated: >60 mL/min (ref >60)
Glucose, Bld: 202 mg/dL — ABNORMAL HIGH (ref 70–99)
Potassium: 3.5 mmol/L (ref 3.5–5.1)
Sodium: 138 mmol/L (ref 135–145)

## 2022-08-28 LAB — CBC
HCT: 31 % — ABNORMAL LOW (ref 36.0–46.0)
Hemoglobin: 10.1 g/dL — ABNORMAL LOW (ref 12.0–15.0)
MCH: 30.7 pg (ref 26.0–34.0)
MCHC: 32.6 g/dL (ref 30.0–36.0)
MCV: 94.2 fL (ref 80.0–100.0)
Platelets: 511 10*3/uL — ABNORMAL HIGH (ref 150–400)
RBC: 3.29 MIL/uL — ABNORMAL LOW (ref 3.87–5.11)
RDW: 15 % (ref 11.5–15.5)
WBC: 20.8 10*3/uL — ABNORMAL HIGH (ref 4.0–10.5)
nRBC: 0 % (ref 0.0–0.2)

## 2022-08-28 LAB — HIV ANTIBODY (ROUTINE TESTING W REFLEX): HIV Screen 4th Generation wRfx: NONREACTIVE

## 2022-08-28 MED ORDER — IPRATROPIUM-ALBUTEROL 0.5-2.5 (3) MG/3ML IN SOLN
3.0000 mL | Freq: Three times a day (TID) | RESPIRATORY_TRACT | Status: DC
  Administered 2022-08-29 (×2): 3 mL via RESPIRATORY_TRACT
  Filled 2022-08-28 (×2): qty 3

## 2022-08-28 NOTE — Progress Notes (Signed)
PROGRESS NOTE    [name]  [id number] DOB: 04/27/1991 DOA: 08/27/2022 PCP: Alvira Monday, FNP   Chief Complaint  Patient presents with   Shortness of Breath         Brief Narrative:   Karen Shields  is a 31 y.o. female, with past medical history of GERD, asthma, presents to ED secondary to shortness of breath, reported started Friday, has been progressively worsening, worse with activity, as well she does report some chest pain related to cough, she was noted to be hypoxic 83% in ED, chest x-ray significant for some bronchitis changes, with right lung base pneumonia, as well patient does endorse laying stems from the gas station, supposed to be for sexual arousal, upon reviewing it, it does appear to be nail polish removal/acetone.  .  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 -Continue with IV steroids for asthma exacerbation, scheduled DuoNebs and as needed albuterol. -Continue with IV antibiotics, given erythromycin for findings suspicious for pneumonia and right lower lung base. -Was encouraged to use incentive spirometry and flutter valve -Leukocytosis most likely related to steroids. -As well possible lung injury from held substance, obtain ABG   Anxiety -continue with Xanax   Hypokalemia -Repleted,   Hyponatremia - replete   DVT prophylaxis: Lovenox Code Status: Full Family Communication: Husband at bedside Disposition:   Status is: Inpatient    Consultants:  none   Subjective:  Denies any chest pain, fever or chills, reports her cough is less productive today,  denies dyspnea at rest, but reports dyspnea with activity.  Objective: Vitals:   08/28/22 0407 08/28/22 0420 08/28/22 0623 08/28/22 0959  BP: (!) 103/51     Pulse: 78     Resp:      Temp: 98 F (36.7 C)     TempSrc: Oral     SpO2: 91% 93%  90%  Weight:   80.4 kg   Height:   5\' 4"  (1.626 m)     Intake/Output Summary (Last 24 hours) at 08/28/2022 1209 Last data filed at 08/28/2022 0900 Gross per 24 hour  Intake 240 ml  Output --  Net 240 ml   Filed Weights   08/27/22 1231 08/27/22 2024 08/28/22 0623  Weight: 78.9 kg 80.4 kg 80.4 kg    Examination:   Awake Alert, Oriented X 3, No new F.N deficits, Normal affect Symmetrical Chest wall movement, Good air movement bilaterally, but with significant end expiratory wheezing RRR +ve B.Sounds, Abd Soft, No tenderness, No rebound - guarding or rigidity. No Cyanosis, Clubbing or edema, No new Rash or bruise       Data Reviewed: I have personally reviewed following labs and imaging studies  CBC: Recent Labs  Lab 08/27/22 1320 08/28/22 0351  WBC 12.0* 20.8*  NEUTROABS 9.7*  --   HGB 11.6* 10.1*  HCT 34.8* 31.0*  MCV 92.8 94.2  PLT 497* 511*    Basic Metabolic Panel: Recent Labs  Lab 08/27/22 1320 08/28/22 0351  NA 134* 138  K 3.4* 3.5  CL 99 109  CO2 25 25  GLUCOSE 115* 202*  BUN 7 10  CREATININE 0.64 0.71  CALCIUM 9.0 8.6*    GFR: Estimated Creatinine Clearance: 104.6 mL/min (by C-G formula based on SCr of 0.71 mg/dL).  Liver Function Tests: No results for input(s): "AST", "ALT", "ALKPHOS", "BILITOT", "PROT", "ALBUMIN" in the last 168 hours.  CBG: No results for input(s): "GLUCAP" in the last 168 hours.   Recent Results (from the past 240 hour(s))  Resp panel by RT-PCR (RSV, Flu A&B, Covid) Anterior Nasal Swab     Status: None   Collection Time: 08/27/22  1:17 PM   Specimen: Anterior Nasal Swab  Result Value Ref Range Status   SARS Coronavirus 2 by RT PCR NEGATIVE NEGATIVE Final    Comment:  (NOTE) SARS-CoV-2 target nucleic acids are NOT DETECTED.  The SARS-CoV-2 RNA is generally detectable in upper respiratory specimens during the acute phase of infection. The lowest concentration of SARS-CoV-2 viral copies this assay can detect is 138 copies/mL. A negative result does not preclude SARS-Cov-2 infection and should not be used as the sole basis for treatment or other patient management decisions. A negative result may occur with  improper specimen collection/handling, submission of specimen other than nasopharyngeal swab, presence of viral mutation(s) within the areas targeted by this assay, and inadequate number of viral copies(<138 copies/mL). A negative result must be combined with clinical observations, patient history, and epidemiological information. The expected result is Negative.  Fact Sheet for Patients:  BloggerCourse.com  Fact Sheet for Healthcare Providers:  SeriousBroker.it  This test is no t yet approved or cleared by the [place] FDA and  has been authorized for detection and/or diagnosis of SARS-CoV-2 by FDA under an Emergency Use Authorization (EUA). This EUA will remain  in effect (meaning this test can be used) for the duration of the COVID-19 declaration under Section 564(b)(1) of the Act, 21 U.S.C.section 360bbb-3(b)(1), unless the authorization is terminated  or revoked sooner.       Influenza A by PCR NEGATIVE NEGATIVE Final   Influenza B by PCR NEGATIVE NEGATIVE Final    Comment: (NOTE) The Xpert Xpress SARS-CoV-2/FLU/RSV plus assay is intended as an aid in the diagnosis of influenza from Nasopharyngeal swab specimens and should not be used as a sole basis for treatment. Nasal washings and aspirates are unacceptable for Xpert Xpress SARS-CoV-2/FLU/RSV testing.  Fact Sheet for Patients: BloggerCourse.com  Fact Sheet for Healthcare  Providers: SeriousBroker.it  This test is not yet approved or cleared by the [place] FDA and has been authorized for detection and/or diagnosis of SARS-CoV-2 by FDA under an Emergency Use Authorization (EUA). This EUA will remain in effect (meaning this test can be used) for the duration of the COVID-19 declaration under Section 564(b)(1) of the Act, 21 U.S.C. section 360bbb-3(b)(1), unless the authorization is terminated or revoked.     Resp Syncytial Virus by PCR NEGATIVE NEGATIVE Final    Comment: (NOTE) Fact Sheet for Patients: BloggerCourse.com  Fact Sheet for Healthcare Providers: SeriousBroker.it  This test is not yet approved or cleared by the [place] FDA and has been authorized for detection and/or diagnosis of SARS-CoV-2 by FDA under an Emergency Use Authorization (EUA). This EUA will remain in effect (meaning this test can be used) for the duration of the COVID-19 declaration under Section 564(b)(1) of the Act, 21 U.S.C. section 360bbb-3(b)(1), unless the authorization is terminated or revoked.  Performed at [facility], [address], [place], [place] [address]   Group A Strep by PCR     Status: None  Collection Time: 08/27/22  7:50 PM   Specimen: Throat; Sterile Swab  Result Value Ref Range Status   Group A Strep by PCR NOT DETECTED NOT DETECTED Final    Comment: Performed at [facility], [address] [place], [place] [address]         Radiology Studies: [facility] 1 View  Result Date: 08/27/2022 CLINICAL DATA:  Shortness of breath. Productive cough. Chills. Body aches. EXAM: PORTABLE CHEST 1 VIEW COMPARISON:  07/28/2020 FINDINGS: Heart size is normal. Mediastinal shadows are normal. There is bronchial thickening. Minimal patchy density evident the right lung base consistent with minimal pneumonia. No lobar consolidation or collapse. No effusion. No acute bone  finding. IMPRESSION: Bronchitis pattern. Minimal patchy pneumonia at the right lung base. Electronically Signed   By: Nelson Chimes M.D.   On: 08/27/2022 13:01        Scheduled Meds:  enoxaparin (LOVENOX) injection  40 mg Subcutaneous Q24H   ipratropium-albuterol  3 mL Nebulization Q4H   methylPREDNISolone (SOLU-MEDROL) injection  80 mg Intravenous Q12H   nicotine  21 mg Transdermal Daily   pantoprazole  40 mg Oral Daily   vortioxetine HBr  20 mg Oral Daily   Continuous Infusions:  sodium chloride 75 mL/hr at 08/27/22 1836   azithromycin     cefTRIAXone (ROCEPHIN)  IV       LOS: 1 day      Phillips Climes, MD [facility]   To contact the attending provider between 7A-7P or the covering provider during after hours 7P-7A, please log into the web site [email or web address] and access using universal [place] password for that web site. If you do not have the password, please call the hospital operator.  08/28/2022, 12:09 PM

## 2022-08-28 NOTE — Plan of Care (Signed)

## 2022-08-28 NOTE — Progress Notes (Signed)
Patient ambulated on the unit on room air and oxygen saturation dropped down to 88%.

## 2022-08-29 DIAGNOSIS — K219 Gastro-esophageal reflux disease without esophagitis: Secondary | ICD-10-CM | POA: Diagnosis not present

## 2022-08-29 DIAGNOSIS — J45901 Unspecified asthma with (acute) exacerbation: Secondary | ICD-10-CM | POA: Diagnosis not present

## 2022-08-29 DIAGNOSIS — J9601 Acute respiratory failure with hypoxia: Secondary | ICD-10-CM

## 2022-08-29 DIAGNOSIS — Z720 Tobacco use: Secondary | ICD-10-CM | POA: Diagnosis not present

## 2022-08-29 DIAGNOSIS — F419 Anxiety disorder, unspecified: Secondary | ICD-10-CM | POA: Diagnosis not present

## 2022-08-29 DIAGNOSIS — R059 Cough, unspecified: Secondary | ICD-10-CM

## 2022-08-29 LAB — BASIC METABOLIC PANEL WITH GFR
Anion gap: 3 — ABNORMAL LOW (ref 5–15)
BUN: 10 mg/dL (ref 6–20)
CO2: 26 mmol/L (ref 22–32)
Calcium: 8.6 mg/dL — ABNORMAL LOW (ref 8.9–10.3)
Chloride: 110 mmol/L (ref 98–111)
Creatinine, Ser: 0.54 mg/dL (ref 0.44–1.00)
GFR, Estimated: >60 mL/min (ref >60)
Glucose, Bld: 170 mg/dL — ABNORMAL HIGH (ref 70–99)
Potassium: 4.1 mmol/L (ref 3.5–5.1)
Sodium: 139 mmol/L (ref 135–145)

## 2022-08-29 LAB — CBC
HCT: 29.3 % — ABNORMAL LOW (ref 36.0–46.0)
Hemoglobin: 9.4 g/dL — ABNORMAL LOW (ref 12.0–15.0)
MCH: 31 pg (ref 26.0–34.0)
MCHC: 32.1 g/dL (ref 30.0–36.0)
MCV: 96.7 fL (ref 80.0–100.0)
Platelets: 482 10*3/uL — ABNORMAL HIGH (ref 150–400)
RBC: 3.03 MIL/uL — ABNORMAL LOW (ref 3.87–5.11)
RDW: 15.9 % — ABNORMAL HIGH (ref 11.5–15.5)
WBC: 21.2 10*3/uL — ABNORMAL HIGH (ref 4.0–10.5)
nRBC: 0 % (ref 0.0–0.2)

## 2022-08-29 MED ORDER — AMOXICILLIN-POT CLAVULANATE 875-125 MG PO TABS: 1.0000 | ORAL_TABLET | Freq: Two times a day (BID) | ORAL | 0 refills | Status: AC

## 2022-08-29 MED ORDER — PREDNISONE 20 MG PO TABS: ORAL_TABLET | ORAL | 0 refills | Status: DC

## 2022-08-29 MED ORDER — PNEUMOCOCCAL 20-VAL CONJ VACC 0.5 ML IM SUSY
0.5000 mL | PREFILLED_SYRINGE | INTRAMUSCULAR | Status: AC
  Administered 2022-08-29: 0.5 mL via INTRAMUSCULAR

## 2022-08-29 MED ORDER — PANTOPRAZOLE SODIUM 40 MG PO TBEC: 40.0000 mg | DELAYED_RELEASE_TABLET | Freq: Every day | ORAL | 1 refills | Status: DC

## 2022-08-29 MED ORDER — METRONIDAZOLE 500 MG PO TABS: 500.0000 mg | ORAL_TABLET | Freq: Two times a day (BID) | ORAL | 0 refills | Status: AC

## 2022-08-29 MED ORDER — PNEUMOCOCCAL VAC POLYVALENT 25 MCG/0.5ML IJ INJ
0.5000 mL | INJECTION | INTRAMUSCULAR | Status: DC
  Filled 2022-08-29: qty 0.5

## 2022-08-29 MED ORDER — NICOTINE 21 MG/24HR TD PT24: 21.0000 mg | MEDICATED_PATCH | Freq: Every day | TRANSDERMAL | 0 refills | Status: DC

## 2022-08-29 MED ORDER — INFLUENZA VAC SPLIT QUAD 0.5 ML IM SUSY
0.5000 mL | PREFILLED_SYRINGE | INTRAMUSCULAR | Status: AC
  Administered 2022-08-29: 0.5 mL via INTRAMUSCULAR
  Filled 2022-08-29: qty 0.5

## 2022-08-29 MED ORDER — MONTELUKAST SODIUM 10 MG PO TABS: 10.0000 mg | ORAL_TABLET | Freq: Every day | ORAL | 2 refills | Status: AC

## 2022-08-29 NOTE — Plan of Care (Signed)

## 2022-08-29 NOTE — Discharge Summary (Signed)
Physician Discharge Summary   Patient: Karen Shields MRN: [id number] DOB: [birth year]  Admit date:     08/27/2022  Discharge date: 08/29/22  Discharge Physician: Vassie Loll   PCP: Gilmore Laroche, FNP   Recommendations at discharge:  Continue assisting patient with tobacco cessation Make sure she follow-up with pulmonologist for PFTs and further adjustment on the maintenance of her asthma therapy. Repeat basic metabolic panel to follow electrolytes and renal function.  Discharge Diagnoses: Principal Problem:   Asthma exacerbation Active Problems:   Anxiety   GERD (gastroesophageal reflux disease)   Tobacco use   Cough   Acute respiratory failure with hypoxia [facility])  Brief Hospital admission course: As per H&P written by Dr. Randol Kern on 08/27/2022 Karen Shields  is a 31 y.o. female, with past medical history of GERD, asthma, presents to ED secondary to shortness of breath, reported started Friday, has been progressively worsening, worse with activity, she does report some chest pain as well, midsternal, nonradiating, related to coughing, does report productive yellow-green phlegm, no fever, no chills, but she was recently started on asthma inhalers, despite history of asthma reports she was never hospitalized or intubated for her asthma, leg swelling, calf tenderness, denies use of OCP, or any long trips.  She reports she has been using a product "rush", which is a sexual arousal and inhalant that she buys at the gas stations, reports this liquid where she snorts all liquid, initially reports last was couple months, upon further questioning reports this may be a week ago, stated that on the bottle as warning of being toxic to the lungs. -ED patient was hypoxic, when she ambulated her oxygen dropped to 83% on room air, she is currently requiring 4 L nasal cannula to maintain 89% on room air, chest x-ray with bronchitic changes, right basilar opacity, dyspnea and wheezing has improved  with steroids, but she remains with significant oxygen requirement, so [facility] hospitalist consulted to admit.  Assessment and Plan: 1-acute respiratory failure with hypoxia secondary to asthma exacerbation and presumed pneumonia. -Excellent response to antibiotics therapy, steroids and bronchodilator management. -At discharge oral antibiotics will be provided to complete treatment, steroids tapering and resumption of albuterol and Symbicort. -Singulair has also been started -Patient instructed to stop smoking and to follow-up with pulmonologist for PFTs and further adjustment to maintenance therapy of her asthma medications  2-anxiety -Continue home anxiolytic therapy -Stable mood at discharge.  3-hypokalemia -Repleted and within normal limits at discharge -Patient advised to maintain adequate hydration.  4-tobacco abuse -Cessation counseling provided -Nicotine patch prescribed at discharge.  5-hyponatremia -In the setting of acute pulmonary process and dehydration -Repleted and stable at discharge -Repeat metabolic panel at follow-up visit to assess electrolytes trend/stability -Advised to maintain adequate hydration.  6-bacterial vaginosis -Treatment with Flagyl twice a day for 5 days provided  7-gastroesophageal reflux disease -Continue PPI.  8-class I obesity -Body mass index is 30.42 kg/m. -Low-calorie diet, portion control and increase physical activity discussed with patient.  Consultants: None Procedures performed: See below for x-ray report. Disposition: Home Diet recommendation: Low calorie diet.  DISCHARGE MEDICATION: Allergies as of 08/29/2022       Reactions   Aloe Swelling   Latex Dermatitis        Medication List     STOP taking these medications    benzonatate 100 MG capsule Commonly known as: TESSALON       TAKE these medications    ALPRAZolam 1 MG tablet Commonly known as: XANAX Take 1 tablet (  1 mg total) by mouth daily as needed  for anxiety.   amoxicillin-clavulanate 875-125 MG tablet Commonly known as: AUGMENTIN Take 1 tablet by mouth 2 (two) times daily for 5 days.   budesonide-formoterol 80-4.5 MCG/ACT inhaler Commonly known as: SYMBICORT Inhale 2 puffs into the lungs 2 (two) times daily.   buprenorphine-naloxone 8-2 mg Subl SL tablet Commonly known as: SUBOXONE Place 1 tablet under the tongue 2 (two) times daily.   cetirizine 10 MG tablet Commonly known as: ZYRTEC Take 10 mg by mouth daily as needed.   montelukast 10 MG tablet Commonly known as: SINGULAIR Take 1 tablet (10 mg total) by mouth daily.   nicotine 21 mg/24hr patch Commonly known as: NICODERM CQ - dosed in mg/24 hours Place 1 patch (21 mg total) onto the skin daily. Start taking on: August 30, 2022   nystatin cream Commonly known as: MYCOSTATIN Apply topically 2 (two) times daily.   nystatin-triamcinolone ointment Commonly known as: MYCOLOG Apply 1 Application topically 2 (two) times daily.   pantoprazole 40 MG tablet Commonly known as: PROTONIX Take 1 tablet (40 mg total) by mouth daily. Start taking on: August 30, 2022   phenazopyridine 100 MG tablet Commonly known as: PYRIDIUM Take 100 mg by mouth 3 (three) times daily as needed.   predniSONE 20 MG tablet Commonly known as: DELTASONE Take 3 tablets by mouth daily x 1 day; then 2 tablets by mouth daily x 2 days; then 1 tablet by mouth daily x 3 days; then half tablet by mouth daily x 3 days and stop prednisone.   Ventolin HFA 108 (90 Base) MCG/ACT inhaler Generic drug: albuterol INHALE TWO PUFFS INTO LUNGS FOUR TIMES DAILY What changed: See the new instructions.   Vitamin D3 125 MCG (5000 UT) Caps Take by mouth.   vortioxetine HBr 20 MG Tabs tablet Commonly known as: Trintellix Take 1 tablet (20 mg total) by mouth daily.        Follow-up Information     Gilmore Laroche, FNP. Schedule an appointment as soon as possible for a visit in 10 day(s).    Specialty: Family Medicine Contact information: [address] [place] [address] [phone]                Discharge Exam: Filed Weights   08/27/22 1231 08/27/22 2024 08/28/22 0623  Weight: 78.9 kg 80.4 kg 80.4 kg   General exam: Alert, awake, oriented x 3; febrile, no chest pain, no nausea or vomiting.  Speaking in full sentences and not requiring oxygen supplementation at discharge. Respiratory system: Improved air movement bilaterally; no significant wheezing appreciated on exam.  No using accessory muscles.  Good saturation on room air. Cardiovascular system:RRR. No murmurs, rubs, gallops. Gastrointestinal system: Abdomen is nondistended, soft and nontender. No organomegaly or masses felt. Normal bowel sounds heard. Central nervous system: Alert and oriented. No focal neurological deficits. Extremities: No cyanosis or clubbing. Skin: No petechiae. Psychiatry: Judgement and insight appear normal. Mood & affect appropriate.    Condition at discharge: Stable and improved.  The results of significant diagnostics from this hospitalization (including imaging, microbiology, ancillary and laboratory) are listed below for reference.   Imaging Studies: DG Chest Port 1 View  Result Date: 08/27/2022 CLINICAL DATA:  Shortness of breath. Productive cough. Chills. Body aches. EXAM: PORTABLE CHEST 1 VIEW COMPARISON:  07/28/2020 FINDINGS: Heart size is normal. Mediastinal shadows are normal. There is bronchial thickening. Minimal patchy density evident the right lung base consistent with minimal pneumonia. No lobar  consolidation or collapse. No effusion. No acute bone finding. IMPRESSION: Bronchitis pattern. Minimal patchy pneumonia at the right lung base. Electronically Signed   By: Paulina Fusi M.D.   On: 08/27/2022 13:01    Microbiology: Results for orders placed or performed during the hospital encounter of 08/27/22  Resp panel by RT-PCR (RSV, Flu A&B, Covid) Anterior Nasal  Swab     Status: None   Collection Time: 08/27/22  1:17 PM   Specimen: Anterior Nasal Swab  Result Value Ref Range Status   SARS Coronavirus 2 by RT PCR NEGATIVE NEGATIVE Final    Comment: (NOTE) SARS-CoV-2 target nucleic acids are NOT DETECTED.  The SARS-CoV-2 RNA is generally detectable in upper respiratory specimens during the acute phase of infection. The lowest concentration of SARS-CoV-2 viral copies this assay can detect is 138 copies/mL. A negative result does not preclude SARS-Cov-2 infection and should not be used as the sole basis for treatment or other patient management decisions. A negative result may occur with  improper specimen collection/handling, submission of specimen other than nasopharyngeal swab, presence of viral mutation(s) within the areas targeted by this assay, and inadequate number of viral copies(<138 copies/mL). A negative result must be combined with clinical observations, patient history, and epidemiological information. The expected result is Negative.  Fact Sheet for Patients:  BloggerCourse.com  Fact Sheet for Healthcare Providers:  SeriousBroker.it  This test is no t yet approved or cleared by the [place] FDA and  has been authorized for detection and/or diagnosis of SARS-CoV-2 by FDA under an Emergency Use Authorization (EUA). This EUA will remain  in effect (meaning this test can be used) for the duration of the COVID-19 declaration under Section 564(b)(1) of the Act, 21 U.S.C.section 360bbb-3(b)(1), unless the authorization is terminated  or revoked sooner.       Influenza A by PCR NEGATIVE NEGATIVE Final   Influenza B by PCR NEGATIVE NEGATIVE Final    Comment: (NOTE) The Xpert Xpress SARS-CoV-2/FLU/RSV plus assay is intended as an aid in the diagnosis of influenza from Nasopharyngeal swab specimens and should not be used as a sole basis for treatment. Nasal washings and aspirates  are unacceptable for Xpert Xpress SARS-CoV-2/FLU/RSV testing.  Fact Sheet for Patients: BloggerCourse.com  Fact Sheet for Healthcare Providers: SeriousBroker.it  This test is not yet approved or cleared by the [place] FDA and has been authorized for detection and/or diagnosis of SARS-CoV-2 by FDA under an Emergency Use Authorization (EUA). This EUA will remain in effect (meaning this test can be used) for the duration of the COVID-19 declaration under Section 564(b)(1) of the Act, 21 U.S.C. section 360bbb-3(b)(1), unless the authorization is terminated or revoked.     Resp Syncytial Virus by PCR NEGATIVE NEGATIVE Final    Comment: (NOTE) Fact Sheet for Patients: BloggerCourse.com  Fact Sheet for Healthcare Providers: SeriousBroker.it  This test is not yet approved or cleared by the [place] FDA and has been authorized for detection and/or diagnosis of SARS-CoV-2 by FDA under an Emergency Use Authorization (EUA). This EUA will remain in effect (meaning this test can be used) for the duration of the COVID-19 declaration under Section 564(b)(1) of the Act, 21 U.S.C. section 360bbb-3(b)(1), unless the authorization is terminated or revoked.  Performed at [facility], [address], [place], [place] [address]   Group A Strep by PCR     Status: None   Collection Time: 08/27/22  7:50 PM   Specimen: Throat; Sterile Swab  Result Value Ref Range  Status   Group A Strep by PCR NOT DETECTED NOT DETECTED Final    Comment: Performed at [facility], [address] [place], [place] [id number]    Labs: CBC: Recent Labs  Lab 08/27/22 1320 08/28/22 0351 08/29/22 0545  WBC 12.0* 20.8* 21.2*  NEUTROABS 9.7*  --   --   HGB 11.6* 10.1* 9.4*  HCT 34.8* 31.0* 29.3*  MCV 92.8 94.2 96.7  PLT 497* 511* 482*   Basic Metabolic Panel: Recent Labs  Lab 08/27/22 1320 08/28/22 0351  08/29/22 0545  NA 134* 138 139  K 3.4* 3.5 4.1  CL 99 109 110  CO2 25 25 26   GLUCOSE 115* 202* 170*  BUN 7 10 10   CREATININE 0.64 0.71 0.54  CALCIUM 9.0 8.6* 8.6*    Discharge time spent: greater than 30 minutes.  Signed: Vassie Lollarlos Dara Beidleman, MD [facility] 08/29/2022

## 2022-08-31 DIAGNOSIS — Z79891 Long term (current) use of opiate analgesic: Secondary | ICD-10-CM | POA: Diagnosis not present

## 2022-09-01 ENCOUNTER — Telehealth: Payer: Self-pay | Admitting: *Deleted

## 2022-09-01 LAB — LEGIONELLA PNEUMOPHILA SEROGP 1 UR AG: L. pneumophila Serogp 1 Ur Ag: NEGATIVE

## 2022-09-01 NOTE — Patient Outreach (Signed)
  Care Coordination [facility] Note Transition Care Management Unsuccessful Follow-up Telephone Call  Date of discharge and from where:  08/29/22 from [facility]  Attempts:  1st Attempt  Reason for unsuccessful TCM follow-up call:  Left voice message   Estanislado Emms RN, BSN [place]  [facility] W[facility]/ranger Care Coordinator

## 2022-09-09 ENCOUNTER — Ambulatory Visit (INDEPENDENT_AMBULATORY_CARE_PROVIDER_SITE_OTHER): Payer: Medicaid Other | Admitting: Psychiatry

## 2022-09-09 DIAGNOSIS — F321 Major depressive disorder, single episode, moderate: Secondary | ICD-10-CM | POA: Diagnosis not present

## 2022-09-09 DIAGNOSIS — F4310 Post-traumatic stress disorder, unspecified: Secondary | ICD-10-CM
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
## 2022-09-21 ENCOUNTER — Other Ambulatory Visit (HOSPITAL_COMMUNITY): Payer: Self-pay | Admitting: Psychiatry

## 2022-09-21 DIAGNOSIS — F4310 Post-traumatic stress disorder, unspecified: Secondary | ICD-10-CM

## 2022-09-22 ENCOUNTER — Telehealth (HOSPITAL_COMMUNITY): Payer: Self-pay | Admitting: *Deleted

## 2022-09-22 DIAGNOSIS — F4310 Post-traumatic stress disorder, unspecified: Secondary | ICD-10-CM

## 2022-09-22 MED ORDER — VORTIOXETINE HBR 20 MG PO TABS: 20.0000 mg | ORAL_TABLET | Freq: Every day | ORAL | 0 refills | Status: DC

## 2022-09-22 MED ORDER — ALPRAZOLAM 1 MG PO TABS: 1.0000 mg | ORAL_TABLET | Freq: Every day | ORAL | 0 refills | Status: DC | PRN

## 2022-09-22 NOTE — Telephone Encounter (Signed)
Patient called stating she would like refills for her Trintellix and Xanax. Per pt her pharmacy is [facility].

## 2022-09-22 NOTE — Telephone Encounter (Signed)
Patient requesting refills for her Xanax and Trintellix to be sent to [facility].

## 2022-09-22 NOTE — Telephone Encounter (Signed)
A 30-day bridge supply prescription given.  Patient need to make appointment to see Dr. Harrington Challenger.

## 2022-09-22 NOTE — Addendum Note (Signed)
Addended by: Berniece Andreas T on: 09/22/2022 12:01 PM   Modules accepted: Orders

## 2022-09-23 ENCOUNTER — Ambulatory Visit (INDEPENDENT_AMBULATORY_CARE_PROVIDER_SITE_OTHER): Payer: Medicaid Other | Admitting: Psychiatry

## 2022-09-23 DIAGNOSIS — F4310 Post-traumatic stress disorder, unspecified: Secondary | ICD-10-CM
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
## 2022-09-24 ENCOUNTER — Telehealth (HOSPITAL_COMMUNITY): Payer: Medicaid Other | Admitting: Psychiatry

## 2022-09-28 DIAGNOSIS — F4312 Post-traumatic stress disorder, chronic: Secondary | ICD-10-CM | POA: Diagnosis not present

## 2022-09-28 DIAGNOSIS — F411 Generalized anxiety disorder: Secondary | ICD-10-CM | POA: Diagnosis not present

## 2022-10-07 ENCOUNTER — Ambulatory Visit (INDEPENDENT_AMBULATORY_CARE_PROVIDER_SITE_OTHER): Payer: Medicaid Other | Admitting: Psychiatry

## 2022-10-07 DIAGNOSIS — F4310 Post-traumatic stress disorder, unspecified: Secondary | ICD-10-CM

## 2022-10-07 DIAGNOSIS — F321 Major depressive disorder, single episode, moderate: Secondary | ICD-10-CM
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
## 2022-10-12 ENCOUNTER — Telehealth (INDEPENDENT_AMBULATORY_CARE_PROVIDER_SITE_OTHER): Payer: Medicaid Other | Admitting: Psychiatry

## 2022-10-12 ENCOUNTER — Encounter (HOSPITAL_COMMUNITY): Payer: Self-pay | Admitting: Psychiatry

## 2022-10-12 DIAGNOSIS — F4310 Post-traumatic stress disorder, unspecified: Secondary | ICD-10-CM

## 2022-10-12 DIAGNOSIS — Z79891 Long term (current) use of opiate analgesic: Secondary | ICD-10-CM | POA: Diagnosis not present

## 2022-10-12 MED ORDER — ALPRAZOLAM 1 MG PO TABS: 1.0000 mg | ORAL_TABLET | Freq: Every day | ORAL | 2 refills | Status: DC | PRN

## 2022-10-12 MED ORDER — VORTIOXETINE HBR 20 MG PO TABS: 20.0000 mg | ORAL_TABLET | Freq: Every day | ORAL | 2 refills | Status: DC

## 2022-10-12 NOTE — Progress Notes (Signed)
Virtual Visit via Video Note  I connected with Karen Shields on 10/12/22 at 11:20 AM EST by a video enabled telemedicine application and verified that I am speaking with the correct person using two identifiers.  Location: Patient: home Provider: office   I discussed the limitations of evaluation and management by telemedicine and the availability of in person appointments. The patient expressed understanding and agreed to proceed.      I discussed the assessment and treatment plan with the patient. The patient was provided an opportunity to ask questions and all were answered. The patient agreed with the plan and demonstrated an understanding of the instructions.   The patient was advised to call back or seek an in-person evaluation if the symptoms worsen or if the condition fails to improve as anticipated.  I provided 15 minutes of non-face-to-face time during this encounter.   Levonne Spiller, MD  [facility] MD/PA/NP OP Progress Note  10/12/2022 11:40 AM Karen Shields  MRN:  [id number]  Chief Complaint:  Chief Complaint  Patient presents with   Anxiety   Depression   Follow-up   HPI: This patient is a 32 year old separated white female who lives with her mother and 2 daughters in [place].  She is a Barrister's clerk.  The patient returns for follow-up after 3-1/2 months regarding her depression and anxiety.  Of note she was hospitalized last month for asthma exacerbation and pneumonia.  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  It was thought that perhaps may have had subjective effects on her lung function.  She claims that she is not using it anymore.  She is still on Suboxone from another provider and she is claiming "I am trying to taper off."  She denies any use of other recreational drugs.  In terms of mood she states that she is doing well.  She denies significant depression or anxiety.   She is sleeping well and her energy is good.  She denies any thoughts of self-harm or suicide. Visit Diagnosis:    ICD-10-CM   1. PTSD (post-traumatic stress disorder)  F43.10 vortioxetine HBr (TRINTELLIX) 20 MG TABS tablet    ALPRAZolam (XANAX) 1 MG tablet      Past Psychiatric History: Past treatment at [facility]  She had 1 hospitalization 5 years ago for depression with suicidal ideation  Past Medical History:  Past Medical History:  Diagnosis Date   Anxiety    Asthma    WELL CONTROLLED   Asthma    Depression    pt stated   Eczema 2008   Gallstones    GERD (gastroesophageal reflux disease)    Headache    Hx MRSA infection 09-2014   IBS (irritable bowel syndrome)    Ovarian cyst     Past Surgical History:  Procedure Laterality Date   CHOLECYSTECTOMY N/A 01/16/2016   Procedure: LAPAROSCOPIC CHOLECYSTECTOMY;  Surgeon: Florene Glen, MD;  Location: [facility] ORS;  Service: General;  Laterality: N/A;   ESOPHAGOGASTRODUODENOSCOPY N/A 11/13/2015   [id number] reflux    OVARIAN CYST REMOVAL     OVARIAN CYST REMOVAL  2011   TUBAL LIGATION     WISDOM TOOTH EXTRACTION      Family Psychiatric History: See below  Family History:  Family History  Problem Relation Age of Onset   Migraines Mother    Diabetes Mother    Anxiety disorder Mother    Depression Mother  Depression Maternal Grandmother    Stroke Maternal Grandfather    Dementia Paternal Grandfather    Colon cancer Neg Hx    Crohn's disease Neg Hx    Celiac disease Neg Hx     Social History:  Social History   Socioeconomic History   Marital status: Legally Separated    Spouse name: Not on file   Number of children: 2   Years of education: Not on file   Highest education level: Not on file  Occupational History   Not on file  Tobacco Use   Smoking status: Every Day    Packs/day: 0.75    Years: 5.00    Total pack years: 3.75    Types: Cigarettes    Start date: 02/11/2006   Smokeless tobacco: Never   Vaping Use   Vaping Use: Former  Substance and Sexual Activity   Alcohol use: Yes    Alcohol/week: 0.0 standard drinks of alcohol    Comment: rarely, maybe once a year   Drug use: Not Currently    Types: Benzodiazepines, Opium   Sexual activity: Yes    Partners: Male    Birth control/protection: Surgical  Other Topics Concern   Not on file  Social History Narrative   ** Merged History Encounter **    Four daughters between herself and husband, two Human resources officer.    Social Determinants of Health   Financial Resource Strain: Not on file  Food Insecurity: Not on file  Transportation Needs: Not on file  Physical Activity: Not on file  Stress: Not on file  Social Connections: Not on file    Allergies:  Allergies  Allergen Reactions   Aloe Swelling   Latex Dermatitis    Metabolic Disorder Labs: Lab Results  Component Value Date   HGBA1C 5.2 08/09/2022   No results found for: "PROLACTIN" Lab Results  Component Value Date   CHOL 170 08/09/2022   TRIG 77 08/09/2022   HDL 45 08/09/2022   CHOLHDL 3.8 08/09/2022   LDLCALC 110 (H) 08/09/2022   LDLCALC 105 (H) 03/17/2022   Lab Results  Component Value Date   TSH 2.020 08/09/2022   TSH 3.270 03/17/2022    Therapeutic Level Labs: No results found for: "LITHIUM" No results found for: "VALPROATE" No results found for: "CBMZ"  Current Medications: Current Outpatient Medications  Medication Sig Dispense Refill   ALPRAZolam (XANAX) 1 MG tablet Take 1 tablet (1 mg total) by mouth daily as needed for anxiety. 30 tablet 2   budesonide-formoterol (SYMBICORT) 80-4.5 MCG/ACT inhaler Inhale 2 puffs into the lungs 2 (two) times daily. (Patient not taking: Reported on 08/27/2022) 1 each 3   buprenorphine-naloxone (SUBOXONE) 8-2 mg SUBL SL tablet Place 1 tablet under the tongue 2 (two) times daily.     cetirizine (ZYRTEC) 10 MG tablet Take 10 mg by mouth daily as needed.     Cholecalciferol (VITAMIN D3) 5000 units CAPS Take by  mouth.     montelukast (SINGULAIR) 10 MG tablet Take 1 tablet (10 mg total) by mouth daily. 30 tablet 2   nicotine (NICODERM CQ - DOSED IN MG/24 HOURS) 21 mg/24hr patch Place 1 patch (21 mg total) onto the skin daily. 28 patch 0   nystatin cream (MYCOSTATIN) Apply topically 2 (two) times daily.     nystatin-triamcinolone ointment (MYCOLOG) Apply 1 Application topically 2 (two) times daily. 30 g 0   pantoprazole (PROTONIX) 40 MG tablet Take 1 tablet (40 mg total) by mouth daily. 30 tablet 1  phenazopyridine (PYRIDIUM) 100 MG tablet Take 100 mg by mouth 3 (three) times daily as needed.     predniSONE (DELTASONE) 20 MG tablet Take 3 tablets by mouth daily x 1 day; then 2 tablets by mouth daily x 2 days; then 1 tablet by mouth daily x 3 days; then half tablet by mouth daily x 3 days and stop prednisone. 12 tablet 0   VENTOLIN HFA 108 (90 Base) MCG/ACT inhaler INHALE TWO PUFFS INTO LUNGS FOUR TIMES DAILY (Patient taking differently: Inhale 2 puffs into the lungs every 6 (six) hours as needed for wheezing or shortness of breath.) 18 g 0   vortioxetine HBr (TRINTELLIX) 20 MG TABS tablet Take 1 tablet (20 mg total) by mouth daily. 30 tablet 2   No current facility-administered medications for this visit.     Musculoskeletal: Strength & Muscle Tone: within normal limits Gait & Station: normal Patient leans: N/A  Psychiatric Specialty Exam: Review of Systems  All other systems reviewed and are negative.   There were no vitals taken for this visit.There is no height or weight on file to calculate BMI.  General Appearance: Casual and Fairly Groomed  Eye Contact:  Good  Speech:  Clear and Coherent  Volume:  Normal  Mood:  Euthymic  Affect:  Congruent  Thought Process:  Goal Directed  Orientation:  Full (Time, Place, and Person)  Thought Content: WDL   Suicidal Thoughts:  No  Homicidal Thoughts:  No  Memory:  Immediate;   Good Recent;   Good Remote;   Good  Judgement:  Fair  Insight:   Shallow  Psychomotor Activity:  Normal  Concentration:  Concentration: Good and Attention Span: Good  Recall:  Good  Fund of Knowledge: Good  Language: Good  Akathisia:  No  Handed:  Right  AIMS (if indicated): not done  Assets:  Communication Skills Desire for Improvement Physical Health Resilience Social Support Talents/Skills  ADL's:  Intact  Cognition: WNL  Sleep:  Good   Screenings: AIMS    Flowsheet Row [facility]  AIMS Total Score 0      AUDIT    Flowsheet Row [facility]  Alcohol Use Disorder Identification Test Final Score (AUDIT) 2      GAD-7    Flowsheet Row Counselor from 04/21/2022 in [facility] [place] Counselor from 01/05/2022 in [facility] at [place] Counselor from 05/11/2021 in [facility] at [place] Counselor from 04/27/2021 in [facility] at [place] Counselor from 10/16/2018 in [facility] at [place]  Total GAD-7 Score 3 9 10 12 6       PHQ2-9    Flowsheet Row Office Visit from 08/05/2022 in [facility] Video Visit from 06/21/2022 in [facility] at [place] Office Visit from 05/05/2022 in [facility] Counselor from 04/21/2022 in [facility] at [place] Video Visit from 03/29/2022 in [facility] Behavior[facility] at [facility] Total Score 2 0 0 1 0  PHQ-9 Total Score 7 -- -- -- --      Flowsheet Row ED to Hosp-Admission (Discharged) from 08/27/2022 in [place] [facility] ED from 07/20/2022 in [facility] at [facility] Video Visit from 06/21/2022 in [facility]patient Behavioral Health at [facility]  RISK CATEGORY No Risk No Risk No Risk        Assessment and Plan: This patient is a 32 year old female with a history depression anxiety.  I also think there is a strong substance abuse component that she is not truly addressing although she is still taking Suboxone.  This is also highlighted better recent use of amyl nitrate.  In any case she denies significant depression or anxiety so she will continue Trintellix 20 mg daily for depression and Xanax 1 mg daily as needed for anxiety.  She will return to see me in 3 months  Collaboration of Care: Collaboration of Care: Referral or follow-up with counselor/therapist AEB patient will continue therapy with Maurice Small in our office  Patient/Guardian was advised Release of Information must be obtained prior to any record release in order to collaborate their care with an outside provider. Patient/Guardian was advised if they have not already done so to contact the registration department to sign all necessary forms in order for [place] to release information regarding their care.   Consent: Patient/Guardian gives verbal consent for treatment and assignment of benefits for services provided during this visit. Patient/Guardian expressed understanding and agreed to proceed.    Levonne Spiller, MD 10/12/2022, 11:40 AM

## 2022-10-22 ENCOUNTER — Telehealth (HOSPITAL_COMMUNITY): Payer: Self-pay | Admitting: Psychiatry

## 2022-10-22 ENCOUNTER — Ambulatory Visit (HOSPITAL_COMMUNITY): Payer: Medicaid Other | Admitting: Psychiatry

## 2022-10-22 NOTE — Telephone Encounter (Signed)
Therapist attempted to contact patient via text through caregility platform for scheduled appointment, no response.  Therapist called patient, left message indicating attempt, and requesting patient call office. 

## 2022-11-05 ENCOUNTER — Encounter: Payer: Self-pay | Admitting: Family Medicine

## 2022-11-05 ENCOUNTER — Ambulatory Visit: Payer: Medicaid Other | Admitting: Family Medicine

## 2022-11-05 VITALS — BP 124/77 | HR 82 | Ht 63.0 in | Wt 177.0 lb

## 2022-11-05 DIAGNOSIS — E559 Vitamin D deficiency, unspecified: Secondary | ICD-10-CM

## 2022-11-05 DIAGNOSIS — E7849 Other hyperlipidemia: Secondary | ICD-10-CM | POA: Diagnosis not present

## 2022-11-05 DIAGNOSIS — R110 Nausea: Secondary | ICD-10-CM | POA: Diagnosis not present

## 2022-11-05 DIAGNOSIS — K589 Irritable bowel syndrome without diarrhea: Secondary | ICD-10-CM | POA: Insufficient documentation

## 2022-11-05 DIAGNOSIS — K580 Irritable bowel syndrome with diarrhea: Secondary | ICD-10-CM | POA: Diagnosis not present

## 2022-11-05 DIAGNOSIS — E0789 Other specified disorders of thyroid: Secondary | ICD-10-CM | POA: Diagnosis not present

## 2022-11-05 DIAGNOSIS — K219 Gastro-esophageal reflux disease without esophagitis: Secondary | ICD-10-CM | POA: Diagnosis not present

## 2022-11-05 DIAGNOSIS — R7301 Impaired fasting glucose: Secondary | ICD-10-CM

## 2022-11-05 MED ORDER — ONDANSETRON HCL 4 MG PO TABS: 4.0000 mg | ORAL_TABLET | Freq: Three times a day (TID) | ORAL | 1 refills | Status: DC | PRN

## 2022-11-05 MED ORDER — DICYCLOMINE HCL 20 MG PO TABS: 20.0000 mg | ORAL_TABLET | Freq: Four times a day (QID) | ORAL | 1 refills | Status: DC

## 2022-11-05 MED ORDER — LOPERAMIDE HCL 2 MG PO TABS: 2.0000 mg | ORAL_TABLET | Freq: Four times a day (QID) | ORAL | 0 refills | Status: DC | PRN

## 2022-11-05 MED ORDER — PANTOPRAZOLE SODIUM 40 MG PO TBEC: 40.0000 mg | DELAYED_RELEASE_TABLET | Freq: Every day | ORAL | 1 refills | Status: DC

## 2022-11-05 NOTE — Progress Notes (Signed)
Established Patient Office Visit  Subjective:  Patient ID: Karen Shields, female    DOB: 1991-07-09  Age: 32 y.o. MRN: [id number]  CC:  Chief Complaint  Patient presents with   Follow-up   Diarrhea    Patient complains of diarrhea, GERD, nausea starting 3 months.    Hyperlipidemia    HPI Karen Shields is a 32 y.o. female with past medical history of GERD, asthma, anxiety and tobacco use presents for f/u of  chronic medical conditions.  For the details of today's visit, please refer to the assessment and plan.     Past Medical History:  Diagnosis Date   Anxiety    Asthma    WELL CONTROLLED   Asthma    Depression    pt stated   Eczema 2008   Gallstones    GERD (gastroesophageal reflux disease)    Headache    Hx MRSA infection 09-2014   IBS (irritable bowel syndrome)    Ovarian cyst     Past Surgical History:  Procedure Laterality Date   CHOLECYSTECTOMY N/A 01/16/2016   Procedure: LAPAROSCOPIC CHOLECYSTECTOMY;  Surgeon: Florene Glen, MD;  Location: [facility] ORS;  Service: General;  Laterality: N/A;   ESOPHAGOGASTRODUODENOSCOPY N/A 11/13/2015   [id number] reflux    OVARIAN CYST REMOVAL     OVARIAN CYST REMOVAL  2011   TUBAL LIGATION     WISDOM TOOTH EXTRACTION      Family History  Problem Relation Age of Onset   Migraines Mother    Diabetes Mother    Anxiety disorder Mother    Depression Mother    Depression Maternal Grandmother    Stroke Maternal Grandfather    Dementia Paternal Grandfather    Colon cancer Neg Hx    Crohn's disease Neg Hx    Celiac disease Neg Hx     Social History   Socioeconomic History   Marital status: Legally Separated    Spouse name: Not on file   Number of children: 2   Years of education: Not on file   Highest education level: Not on file  Occupational History   Not on file  Tobacco Use   Smoking status: Every Day    Packs/day: 0.75    Years: 5.00    Total pack years: 3.75    Types: Cigarettes    Start date:  02/11/2006   Smokeless tobacco: Never  Vaping Use   Vaping Use: Former  Substance and Sexual Activity   Alcohol use: Yes    Alcohol/week: 0.0 standard drinks of alcohol    Comment: rarely, maybe once a year   Drug use: Not Currently    Types: Benzodiazepines, Opium   Sexual activity: Yes    Partners: Male    Birth control/protection: Surgical  Other Topics Concern   Not on file  Social History Narrative   ** Merged History Encounter **    Four daughters between herself and husband, two Oncologist.    Social Determinants of Health   Financial Resource Strain: Not on file  Food Insecurity: Not on file  Transportation Needs: Not on file  Physical Activity: Not on file  Stress: Not on file  Social Connections: Not on file  Intimate Partner Violence: Not on file    Outpatient Medications Prior to Visit  Medication Sig Dispense Refill   ALPRAZolam (XANAX) 1 MG tablet Take 1 tablet (1 mg total) by mouth daily as needed for anxiety. 30 tablet 2   budesonide-formoterol (SYMBICORT) 80-4.5 MCG/ACT  inhaler Inhale 2 puffs into the lungs 2 (two) times daily. 1 each 3   buprenorphine-naloxone (SUBOXONE) 8-2 mg SUBL SL tablet Place 1 tablet under the tongue 2 (two) times daily.     cetirizine (ZYRTEC) 10 MG tablet Take 10 mg by mouth daily as needed.     Cholecalciferol (VITAMIN D3) 5000 units CAPS Take by mouth.     montelukast (SINGULAIR) 10 MG tablet Take 1 tablet (10 mg total) by mouth daily. 30 tablet 2   nicotine (NICODERM CQ - DOSED IN MG/24 HOURS) 21 mg/24hr patch Place 1 patch (21 mg total) onto the skin daily. 28 patch 0   nystatin cream (MYCOSTATIN) Apply topically 2 (two) times daily.     nystatin-triamcinolone ointment (MYCOLOG) Apply 1 Application topically 2 (two) times daily. 30 g 0   phenazopyridine (PYRIDIUM) 100 MG tablet Take 100 mg by mouth 3 (three) times daily as needed.     predniSONE (DELTASONE) 20 MG tablet Take 3 tablets by mouth daily x 1 day; then 2 tablets by  mouth daily x 2 days; then 1 tablet by mouth daily x 3 days; then half tablet by mouth daily x 3 days and stop prednisone. 12 tablet 0   SUBOXONE 8-2 MG FILM Place under the tongue 2 (two) times daily.     VENTOLIN HFA 108 (90 Base) MCG/ACT inhaler INHALE TWO PUFFS INTO LUNGS FOUR TIMES DAILY (Patient taking differently: Inhale 2 puffs into the lungs every 6 (six) hours as needed for wheezing or shortness of breath.) 18 g 0   vortioxetine HBr (TRINTELLIX) 20 MG TABS tablet Take 1 tablet (20 mg total) by mouth daily. 30 tablet 2   pantoprazole (PROTONIX) 40 MG tablet Take 1 tablet (40 mg total) by mouth daily. 30 tablet 1   No facility-administered medications prior to visit.    Allergies  Allergen Reactions   Aloe Swelling   Latex Dermatitis    ROS Review of Systems  Constitutional:  Negative for chills and fever.  Eyes:  Negative for visual disturbance.  Respiratory:  Negative for chest tightness and shortness of breath.   Gastrointestinal:  Positive for abdominal pain, diarrhea and nausea.  Neurological:  Negative for dizziness and headaches.      Objective:    Physical Exam HENT:     Head: Normocephalic.     Mouth/Throat:     Mouth: Mucous membranes are moist.  Cardiovascular:     Rate and Rhythm: Normal rate.     Heart sounds: Normal heart sounds.  Pulmonary:     Effort: Pulmonary effort is normal.     Breath sounds: Normal breath sounds.  Abdominal:     Tenderness: There is no right CVA tenderness or left CVA tenderness.  Neurological:     Mental Status: She is alert.     BP 124/77   Pulse 82   Ht 5' 3"$  (1.6 m)   Wt 177 lb (80.3 kg)   SpO2 97%   BMI 31.35 kg/m  Wt Readings from Last 3 Encounters:  11/05/22 177 lb (80.3 kg)  08/28/22 177 lb 4 oz (80.4 kg)  08/05/22 180 lb (81.6 kg)    Lab Results  Component Value Date   TSH 2.020 08/09/2022   Lab Results  Component Value Date   WBC 21.2 (H) 08/29/2022   HGB 9.4 (L) 08/29/2022   HCT 29.3 (L)  08/29/2022   MCV 96.7 08/29/2022   PLT 482 (H) 08/29/2022   Lab Results  Component Value  Date   NA 139 08/29/2022   K 4.1 08/29/2022   CO2 26 08/29/2022   GLUCOSE 170 (H) 08/29/2022   BUN 10 08/29/2022   CREATININE 0.54 08/29/2022   BILITOT 0.4 08/09/2022   ALKPHOS 107 08/09/2022   AST 8 08/09/2022   ALT 9 08/09/2022   PROT 6.3 08/09/2022   ALBUMIN 4.1 08/09/2022   CALCIUM 8.6 (L) 08/29/2022   ANIONGAP 3 (L) 08/29/2022   EGFR 94 08/09/2022   Lab Results  Component Value Date   CHOL 170 08/09/2022   Lab Results  Component Value Date   HDL 45 08/09/2022   Lab Results  Component Value Date   LDLCALC 110 (H) 08/09/2022   Lab Results  Component Value Date   TRIG 77 08/09/2022   Lab Results  Component Value Date   CHOLHDL 3.8 08/09/2022   Lab Results  Component Value Date   HGBA1C 5.2 08/09/2022      Assessment & Plan:  Irritable bowel syndrome with diarrhea Assessment & Plan: Diagnosed with IBS in 2017 Hx of cholecystectomy in 2014 Complains of diarrhea, abdominal pain, and nausea Reports having diarrhea after eating Reports occasional mucus in her stools She reports having 4 loose stools when she has diarrhea No blood in her stools No fever  reported Reports being constipated on 11/04/2022 She reports that her symptoms has been off and on for the last 3 months Encouraged to take  loperamide 2 mg 45 minutes before a meal as needed for diarrhea  Encouraged to take Dicyclomine 20 mg orally four times daily as needed for abdominal pain Encouraged to take zofran 4 mg every 8 hours as needed for nausea Recommended a diet low in FODMAPs    Orders: -     Loperamide HCl; Take 1 tablet (2 mg total) by mouth 4 (four) times daily as needed for diarrhea or loose stools.  Dispense: 30 tablet; Refill: 0 -     Dicyclomine HCl; Take 1 tablet (20 mg total) by mouth in the morning, at noon, in the evening, and at bedtime.  Dispense: 60 tablet; Refill: 1  Nausea -      Ondansetron HCl; Take 1 tablet (4 mg total) by mouth every 8 (eight) hours as needed for nausea or vomiting.  Dispense: 30 tablet; Refill: 1  Gastroesophageal reflux disease without esophagitis Assessment & Plan: Refilled protonix 40 mg  Orders: -     Pantoprazole Sodium; Take 1 tablet (40 mg total) by mouth daily.  Dispense: 30 tablet; Refill: 1  Other hyperlipidemia -     Lipid panel -     CMP14+EGFR -     CBC with Differential/Platelet  Other specified disorders of thyroid -     TSH + free T4  Impaired fasting blood sugar -     Hemoglobin A1c  Vitamin D deficiency -     VITAMIN D 25 Hydroxy (Vit-D Deficiency, Fractures)    Follow-up: Return in about 3 months (around 02/03/2023).   Alvira Monday, FNP

## 2022-11-05 NOTE — Assessment & Plan Note (Signed)
Diagnosed with IBS in 2017 Hx of cholecystectomy in 2014 Complains of diarrhea, abdominal pain, and nausea Reports having diarrhea after eating Reports occasional mucus in her stools She reports having 4 loose stools when she has diarrhea No blood in her stools No fever  reported Reports being constipated on 11/04/2022 She reports that her symptoms has been off and on for the last 3 months Encouraged to take  loperamide 2 mg 45 minutes before a meal as needed for diarrhea  Encouraged to take Dicyclomine 20 mg orally four times daily as needed for abdominal pain Encouraged to take zofran 4 mg every 8 hours as needed for nausea Recommended a diet low in FODMAPs

## 2022-11-05 NOTE — Patient Instructions (Addendum)
I appreciate the opportunity to provide care to you today!    Follow up:  3 months  Labs: please stop by the lab today/ during the week to get your blood drawn (CBC, CMP, TSH, Lipid profile, HgA1c, Vit D)  IBS Please avoid  gas-producing foods I recommend  following  a diet low in FODMAPs Fermentable Oligosaccharides (Wheat, barley, rye, onion, leek, white part of spring onion, garlic, shallots, artichokes, beetroot, fennel, peas, chicory, pistachio, cashews, legumes, lentils, and chickpeas) Disaccharides (Milk, custard, ice cream, and yogurt) Monosaccharides (Apples, pears, mangoes, cherries, watermelon, asparagus, sugar snap peas, honey, high-fructose corn syrup) Polys (Apples, pears, apricots, cherries, nectarines, peaches, plums, watermelon, mushrooms, cauliflower, artificially sweetened chewing gum and confectionery)  Diarrhea: I recommend taking loperamide 2 mg 45 minutes before a meal as needed for diarrhea   Abdominal Cramps Please take Dicyclomine 20 mg orally four times daily as needed   Nausea:  Please take zofran 4 mg every 8 hours as needed  GERD Refilled Protonix 40 mg   Please continue to a heart-healthy diet and increase your physical activities. Try to exercise for 46mns at least five times a week.      It was a pleasure to see you and I look forward to continuing to work together on your health and well-being. Please do not hesitate to call the office if you need care or have questions about your care.   Have a wonderful day and week. With Gratitude, GAlvira MondayMSN, FNP-BC

## 2022-11-05 NOTE — Assessment & Plan Note (Signed)
Refilled protonix 40 mg

## 2022-11-09 ENCOUNTER — Telehealth (HOSPITAL_COMMUNITY): Payer: Self-pay | Admitting: Psychiatry

## 2022-11-09 ENCOUNTER — Ambulatory Visit (HOSPITAL_COMMUNITY): Payer: Medicaid Other | Admitting: Psychiatry

## 2022-11-09 DIAGNOSIS — F411 Generalized anxiety disorder: Secondary | ICD-10-CM | POA: Diagnosis not present

## 2022-11-09 DIAGNOSIS — Z79891 Long term (current) use of opiate analgesic: Secondary | ICD-10-CM | POA: Diagnosis not present

## 2022-11-09 DIAGNOSIS — F4312 Post-traumatic stress disorder, chronic: Secondary | ICD-10-CM | POA: Diagnosis not present

## 2022-11-09 NOTE — Telephone Encounter (Signed)
Therapist attempted to contact patient via text through caregility platform for scheduled appointment, no response.  Therapist called patient and received voicemail recording.  Therapist left message indicating attempt and requesting patient call office.

## 2022-11-10 ENCOUNTER — Ambulatory Visit (INDEPENDENT_AMBULATORY_CARE_PROVIDER_SITE_OTHER): Payer: Medicaid Other | Admitting: Psychiatry

## 2022-11-10 DIAGNOSIS — F4310 Post-traumatic stress disorder, unspecified: Secondary | ICD-10-CM

## 2022-11-10 DIAGNOSIS — F321 Major depressive disorder, single episode, moderate: Secondary | ICD-10-CM | POA: Diagnosis not present
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
## 2022-11-12 DIAGNOSIS — E7849 Other hyperlipidemia: Secondary | ICD-10-CM | POA: Diagnosis not present

## 2022-11-12 DIAGNOSIS — E0789 Other specified disorders of thyroid: Secondary | ICD-10-CM | POA: Diagnosis not present

## 2022-11-12 DIAGNOSIS — E559 Vitamin D deficiency, unspecified: Secondary | ICD-10-CM | POA: Diagnosis not present

## 2022-11-12 DIAGNOSIS — R7301 Impaired fasting glucose: Secondary | ICD-10-CM | POA: Diagnosis not present

## 2022-11-13 LAB — CBC WITH DIFFERENTIAL/PLATELET
Basophils Absolute: 0 10*3/uL (ref 0.0–0.2)
Basos: 0 %
EOS (ABSOLUTE): 0.3 10*3/uL (ref 0.0–0.4)
Eos: 3 %
Hematocrit: 44 % (ref 34.0–46.6)
Hemoglobin: 15 g/dL (ref 11.1–15.9)
Immature Grans (Abs): 0 10*3/uL (ref 0.0–0.1)
Immature Granulocytes: 0 %
Lymphocytes Absolute: 3.5 10*3/uL — ABNORMAL HIGH (ref 0.7–3.1)
Lymphs: 39 %
MCH: 30.5 pg (ref 26.6–33.0)
MCHC: 34.1 g/dL (ref 31.5–35.7)
MCV: 90 fL (ref 79–97)
Monocytes Absolute: 0.6 10*3/uL (ref 0.1–0.9)
Monocytes: 7 %
Neutrophils Absolute: 4.6 10*3/uL (ref 1.4–7.0)
Neutrophils: 51 %
Platelets: 367 10*3/uL (ref 150–450)
RBC: 4.91 x10E6/uL (ref 3.77–5.28)
RDW: 11.7 % (ref 11.7–15.4)
WBC: 9.2 10*3/uL (ref 3.4–10.8)

## 2022-11-13 LAB — CMP14+EGFR
ALT: 14 IU/L (ref 0–32)
AST: 17 IU/L (ref 0–40)
Albumin/Globulin Ratio: 1.8 (ref 1.2–2.2)
Albumin: 4.2 g/dL (ref 3.9–4.9)
Alkaline Phosphatase: 100 IU/L (ref 44–121)
BUN/Creatinine Ratio: 11 (ref 9–23)
BUN: 9 mg/dL (ref 6–20)
Bilirubin Total: 0.3 mg/dL (ref 0.0–1.2)
CO2: 20 mmol/L (ref 20–29)
Calcium: 9.4 mg/dL (ref 8.7–10.2)
Chloride: 105 mmol/L (ref 96–106)
Creatinine, Ser: 0.79 mg/dL (ref 0.57–1.00)
Globulin, Total: 2.3 g/dL (ref 1.5–4.5)
Glucose: 92 mg/dL (ref 70–99)
Potassium: 4.2 mmol/L (ref 3.5–5.2)
Sodium: 138 mmol/L (ref 134–144)
Total Protein: 6.5 g/dL (ref 6.0–8.5)
eGFR: 102 mL/min/{1.73_m2} (ref >59)

## 2022-11-13 LAB — TSH+FREE T4
Free T4: 1.52 ng/dL (ref 0.82–1.77)
TSH: 0.995 u[IU]/mL (ref 0.450–4.500)

## 2022-11-13 LAB — LIPID PANEL
Chol/HDL Ratio: 3.7 ratio (ref 0.0–4.4)
Cholesterol, Total: 157 mg/dL (ref 100–199)
HDL: 43 mg/dL (ref >39)
LDL Chol Calc (NIH): 100 mg/dL — ABNORMAL HIGH (ref 0–99)
Triglycerides: 73 mg/dL (ref 0–149)
VLDL Cholesterol Cal: 14 mg/dL (ref 5–40)

## 2022-11-13 LAB — HEMOGLOBIN A1C
Est. average glucose Bld gHb Est-mCnc: 114 mg/dL
Hgb A1c MFr Bld: 5.6 % (ref 4.8–5.6)

## 2022-11-13 LAB — VITAMIN D 25 HYDROXY (VIT D DEFICIENCY, FRACTURES): Vit D, 25-Hydroxy: 36.4 ng/mL (ref 30.0–100.0)

## 2022-11-13 NOTE — Progress Notes (Signed)
Please inform the patient that her LDL has decreased from 110 to 100.  I recommend avoiding simple carbohydrates, including cakes, sweet desserts, ice cream, soda (diet or regular), sweet tea, candies, chips, cookies, store-bought juices, alcohol in excess of 1-2 drinks a day, lemonade, artificial sweeteners, donuts, coffee creamers, and sugar-free products.  I recommend avoiding greasy, fatty foods with increased physical activity.  All other labs are stable

## 2022-11-15 ENCOUNTER — Telehealth: Payer: Self-pay | Admitting: Family Medicine

## 2022-11-15 NOTE — Telephone Encounter (Signed)
Patient returning lab results call 

## 2022-11-16 NOTE — Telephone Encounter (Signed)
Lab results given

## 2022-12-16 ENCOUNTER — Telehealth (INDEPENDENT_AMBULATORY_CARE_PROVIDER_SITE_OTHER): Payer: Medicaid Other | Admitting: Family Medicine

## 2022-12-16 ENCOUNTER — Encounter: Payer: Self-pay | Admitting: Family Medicine

## 2022-12-16 VITALS — Ht 64.0 in | Wt 170.0 lb

## 2022-12-16 DIAGNOSIS — M62838 Other muscle spasm: Secondary | ICD-10-CM | POA: Diagnosis not present

## 2022-12-16 MED ORDER — CYCLOBENZAPRINE HCL 5 MG PO TABS: 5.0000 mg | ORAL_TABLET | Freq: Three times a day (TID) | ORAL | 0 refills | Status: DC | PRN

## 2022-12-16 MED ORDER — CYCLOBENZAPRINE HCL 10 MG PO TABS: 10.0000 mg | ORAL_TABLET | Freq: Three times a day (TID) | ORAL | 0 refills | Status: DC | PRN

## 2022-12-16 NOTE — Progress Notes (Signed)
Virtual Visit via Video Note  I connected with Karen Shields on 12/16/22 at 11:40 AM EDT by a video enabled telemedicine application and verified that I am speaking with the correct person using two identifiers.  Patient Location: Home Provider Location: Office/Clinic  I discussed the limitations, risks, security, and privacy concerns of performing an evaluation and management service by video and the availability of in person appointments. I also discussed with the patient that there may be a patient responsible charge related to this service. The patient expressed understanding and agreed to proceed.  Subjective: PCP: Karen Shields, [name]  Chief Complaint  Patient presents with   Leg Pain    Patient complains of leg and foot pain, and cramping starting last week.   Leg Pain  The incident occurred more than 1 week ago. There was no injury mechanism. The pain is present in the right thigh and left thigh and radiates to the foot. The quality of the pain is described as aching and cramping. The pain is at a severity of 8/10. Patient reports bilateral leg pain pain has been luctuating since onset. Associated symptoms include a loss of sensation, muscle weakness, numbness and tingling. She reports no foreign bodies present. The symptoms are aggravated by movement. She has tried acetaminophen and NSAIDs for the symptoms. The treatment provided no relief.     ROS: Per HPI  Current Outpatient Medications:    ALPRAZolam (XANAX) 1 MG tablet, Take 1 tablet (1 mg total) by mouth daily as needed for anxiety., Disp: 30 tablet, Rfl: 2   budesonide-formoterol (SYMBICORT) 80-4.5 MCG/ACT inhaler, Inhale 2 puffs into the lungs 2 (two) times daily., Disp: 1 each, Rfl: 3   buprenorphine-naloxone (SUBOXONE) 8-2 mg SUBL SL tablet, Place 1 tablet under the tongue 2 (two) times daily., Disp: , Rfl:    cetirizine (ZYRTEC) 10 MG tablet, Take 10 mg by mouth daily as needed., Disp: , Rfl:    Cholecalciferol  (VITAMIN D3) 5000 units CAPS, Take by mouth., Disp: , Rfl:    cyclobenzaprine (FLEXERIL) 10 MG tablet, Take 1 tablet (10 mg total) by mouth 3 (three) times daily as needed for muscle spasms., Disp: 30 tablet, Rfl: 0   dicyclomine (BENTYL) 20 MG tablet, Take 1 tablet (20 mg total) by mouth in the morning, at noon, in the evening, and at bedtime., Disp: 60 tablet, Rfl: 1   loperamide (IMODIUM A-D) 2 MG tablet, Take 1 tablet (2 mg total) by mouth 4 (four) times daily as needed for diarrhea or loose stools., Disp: 30 tablet, Rfl: 0   montelukast (SINGULAIR) 10 MG tablet, Take 1 tablet (10 mg total) by mouth daily., Disp: 30 tablet, Rfl: 2   nicotine (NICODERM CQ - DOSED IN MG/24 HOURS) 21 mg/24hr patch, Place 1 patch (21 mg total) onto the skin daily., Disp: 28 patch, Rfl: 0   nystatin cream (MYCOSTATIN), Apply topically 2 (two) times daily., Disp: , Rfl:    nystatin-triamcinolone ointment (MYCOLOG), Apply 1 Application topically 2 (two) times daily., Disp: 30 g, Rfl: 0   ondansetron (ZOFRAN) 4 MG tablet, Take 1 tablet (4 mg total) by mouth every 8 (eight) hours as needed for nausea or vomiting., Disp: 30 tablet, Rfl: 1   pantoprazole (PROTONIX) 40 MG tablet, Take 1 tablet (40 mg total) by mouth daily., Disp: 30 tablet, Rfl: 1   phenazopyridine (PYRIDIUM) 100 MG tablet, Take 100 mg by mouth 3 (three) times daily as needed., Disp: , Rfl:    predniSONE (DELTASONE) 20  MG tablet, Take 3 tablets by mouth daily x 1 day; then 2 tablets by mouth daily x 2 days; then 1 tablet by mouth daily x 3 days; then half tablet by mouth daily x 3 days and stop prednisone., Disp: 12 tablet, Rfl: 0   SUBOXONE 8-2 MG FILM, Place under the tongue 2 (two) times daily., Disp: , Rfl:    VENTOLIN HFA 108 (90 Base) MCG/ACT inhaler, INHALE TWO PUFFS INTO LUNGS FOUR TIMES DAILY (Patient taking differently: Inhale 2 puffs into the lungs every 6 (six) hours as needed for wheezing or shortness of breath.), Disp: 18 g, Rfl: 0   vortioxetine  HBr (TRINTELLIX) 20 MG TABS tablet, Take 1 tablet (20 mg total) by mouth daily., Disp: 30 tablet, Rfl: 2  Observations/Objective: Today's Vitals   12/16/22 1123  Weight: 170 lb (77.1 kg)  Height: 5\' 4"  (1.626 m)   Physical Exam Telehealth visit  Assessment and Plan: Muscle spasm Assessment & Plan: Bilateral leg spasms I encouraged to eat rich potassium foods such as, apricots, avocados, apples, bananas, spinach, kale, tomatoes, carrots ans potatoes. In our next visit we'll re-check potassium levels.  Flexeril 10 mg PRN  Orders: -     Cyclobenzaprine HCl; Take 1 tablet (10 mg total) by mouth 3 (three) times daily as needed for muscle spasms.  Dispense: 30 tablet; Refill: 0    Follow Up Instructions: No follow-ups on file.   I discussed the assessment and treatment plan with the patient. The patient was provided an opportunity to ask questions, and all were answered. The patient agreed with the plan and demonstrated an understanding of the instructions.   The patient was advised to call back or seek an in-person evaluation if the symptoms worsen or if the condition fails to improve as anticipated.  The above assessment and management plan was discussed with the patient. The patient verbalized understanding of and has agreed to the management plan.   Renard Hamper Ria Comment, FNP

## 2022-12-16 NOTE — Assessment & Plan Note (Signed)
Bilateral leg spasms I encouraged to eat rich potassium foods such as, apricots, avocados, apples, bananas, spinach, kale, tomatoes, carrots ans potatoes. In our next visit we'll re-check potassium levels.  Flexeril 10 mg PRN

## 2022-12-23 NOTE — Progress Notes (Signed)
NEUROLOGY FOLLOW UP OFFICE NOTE  Karen Shields [id number]  Assessment/Plan:   Ocular migraines Numbness in calves   1  Check NCV-EMG of lower extremities. 2  Check B12 level 3  Further recommendations pending results.  Follow up 6 months.     Subjective:  Karen Shields is a 32 year old right-female with asthma, depression, anxiety and IBS who follows up for cerebral aneurysm.    UPDATE: CTA of head on 01/14/2022 personally reviewed was normal, indicating that the previously seen outpouching arising from the left cavernous ICA was artifactual.  No headaches.  She started having ocular migraines once a week for the past month, occurs in either eye lasting 10 minutes.  Other than that, no episodes before then.  She has muscle spasm in the calves for several months.  It occurs at night in bed or it may occur bearing weight while walking down stairs.  Potassium level is fine.  Takes Flexeril with minimal benefit.  No back pain and no radicular pain down the legs.  No weakness but feels like her legs will give out.  Also notes feeling of numbness in the calves when it cramps.     HISTORY: On 12/04/2021, she was at work when she developed sudden onset blurred and foggy vision in her left eye that quickly progressed to being unable to see at all out of her left eye.  Symptoms lasted 25-30 minutes.  No associated eye pain, headache, facial droop, or unilateral numbness or weakness.  She had a similar event about 3 months prior.  She went to the Urgent Care and was advised to go to the ED.  She didn't go right away but then had another episode but this time occurring in the right eye and only lasted 5-10 minutes.  On 12/17/2021 to go to the ED.  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  She saw ophthalmology the next day with normal exam. She reports a remote history of migraine headaches.    PAST MEDICAL HISTORY: Past Medical History:  Diagnosis Date   Anxiety    Asthma    WELL CONTROLLED   Asthma    Depression    pt stated   Eczema 2008   Gallstones    GERD (gastroesophageal reflux disease)    Headache    Hx MRSA infection 09-2014   IBS (irritable bowel syndrome)    Ovarian cyst     MEDICATIONS: Current Outpatient Medications on File Prior to Visit  Medication Sig Dispense Refill   ALPRAZolam (XANAX) 1 MG tablet Take 1 tablet (1 mg total) by mouth daily as needed for anxiety. 30 tablet 2   budesonide-formoterol (SYMBICORT) 80-4.5 MCG/ACT inhaler Inhale 2 puffs into the lungs 2 (two) times daily. 1 each 3   buprenorphine-naloxone (SUBOXONE) 8-2 mg SUBL SL tablet Place 1 tablet under the tongue 2 (two) times daily.     cetirizine (ZYRTEC) 10 MG tablet Take 10 mg by mouth daily as needed.     Cholecalciferol (VITAMIN D3) 5000 units CAPS Take by mouth.     cyclobenzaprine (FLEXERIL) 10 MG tablet Take 1 tablet (10 mg total) by mouth 3 (three) times daily as needed for muscle spasms. 30 tablet 0   dicyclomine (BENTYL) 20 MG tablet Take 1 tablet (20 mg total) by mouth  in the morning, at noon, in the evening, and at bedtime. 60 tablet 1   loperamide (IMODIUM A-D) 2 MG tablet Take 1 tablet (2 mg total) by mouth 4 (four) times daily as needed for diarrhea or loose stools. 30 tablet 0   montelukast (SINGULAIR) 10 MG tablet Take 1 tablet (10 mg total) by mouth daily. 30 tablet 2   nicotine (NICODERM CQ - DOSED IN MG/24 HOURS) 21 mg/24hr patch Place 1 patch (21 mg total) onto the skin daily. 28 patch 0   nystatin cream (MYCOSTATIN) Apply topically 2 (two) times daily.     nystatin-triamcinolone ointment (MYCOLOG) Apply 1 Application topically 2 (two) times daily. 30 g 0   ondansetron (ZOFRAN) 4 MG tablet Take 1 tablet (4 mg total) by mouth every 8  (eight) hours as needed for nausea or vomiting. 30 tablet 1   pantoprazole (PROTONIX) 40 MG tablet Take 1 tablet (40 mg total) by mouth daily. 30 tablet 1   phenazopyridine (PYRIDIUM) 100 MG tablet Take 100 mg by mouth 3 (three) times daily as needed.     predniSONE (DELTASONE) 20 MG tablet Take 3 tablets by mouth daily x 1 day; then 2 tablets by mouth daily x 2 days; then 1 tablet by mouth daily x 3 days; then half tablet by mouth daily x 3 days and stop prednisone. 12 tablet 0   SUBOXONE 8-2 MG FILM Place under the tongue 2 (two) times daily.     VENTOLIN HFA 108 (90 Base) MCG/ACT inhaler INHALE TWO PUFFS INTO LUNGS FOUR TIMES DAILY (Patient taking differently: Inhale 2 puffs into the lungs every 6 (six) hours as needed for wheezing or shortness of breath.) 18 g 0   vortioxetine HBr (TRINTELLIX) 20 MG TABS tablet Take 1 tablet (20 mg total) by mouth daily. 30 tablet 2   No current facility-administered medications on file prior to visit.    ALLERGIES: Allergies  Allergen Reactions   Aloe Swelling   Latex Dermatitis    FAMILY HISTORY: Family History  Problem Relation Age of Onset   Migraines Mother    Diabetes Mother    Anxiety disorder Mother    Depression Mother    Depression Maternal Grandmother    Stroke Maternal Grandfather    Dementia Paternal Grandfather    Colon cancer Neg Hx    Crohn's disease Neg Hx    Celiac disease Neg Hx       Objective:  Blood pressure 125/77, pulse 99, height 5\' 3"  (1.6 m), weight 180 lb 9.6 oz (81.9 kg), SpO2 95 %. General: No acute distress.  Patient appears well-groomed.   Head:  Normocephalic/atraumatic Eyes:  Fundi examined but not visualized Neck: supple, no paraspinal tenderness, full range of motion Heart:  Regular rate and rhythm Back: No paraspinal tenderness Neurological Exam: alert and oriented to person, place, and time.  Speech fluent and not dysarthric, language intact.  CN II-XII intact. Bulk and tone normal, muscle strength  5/5 throughout.  Sensation to light touch intact.  Deep tendon reflexes 2+ throughout, toes downgoing.  Finger to nose testing intact.  Gait normal, Romberg negative.   Shon Millet, DO  CC: Gilmore Laroche, FNP

## 2022-12-29 DIAGNOSIS — F4312 Post-traumatic stress disorder, chronic: Secondary | ICD-10-CM | POA: Diagnosis not present

## 2022-12-29 DIAGNOSIS — F411 Generalized anxiety disorder: Secondary | ICD-10-CM | POA: Diagnosis not present

## 2023-01-04 ENCOUNTER — Other Ambulatory Visit (INDEPENDENT_AMBULATORY_CARE_PROVIDER_SITE_OTHER): Payer: Medicaid Other

## 2023-01-04 ENCOUNTER — Encounter: Payer: Self-pay | Admitting: Neurology

## 2023-01-04 ENCOUNTER — Ambulatory Visit (INDEPENDENT_AMBULATORY_CARE_PROVIDER_SITE_OTHER): Payer: Medicaid Other | Admitting: Neurology

## 2023-01-04 VITALS — BP 125/77 | HR 99 | Ht 63.0 in | Wt 180.6 lb

## 2023-01-04 DIAGNOSIS — R200 Anesthesia of skin: Secondary | ICD-10-CM | POA: Diagnosis not present

## 2023-01-04 DIAGNOSIS — G43109 Migraine with aura, not intractable, without status migrainosus: Secondary | ICD-10-CM

## 2023-01-04 LAB — VITAMIN B12: Vitamin B-12: 829 pg/mL (ref 211–911)

## 2023-01-04 NOTE — Patient Instructions (Signed)
Check B12 Check nerve conduction study of legs. Follow up 6 months.  Further recommendations pending results.

## 2023-01-11 ENCOUNTER — Other Ambulatory Visit (HOSPITAL_COMMUNITY): Payer: Self-pay | Admitting: Psychiatry

## 2023-01-11 DIAGNOSIS — F4310 Post-traumatic stress disorder, unspecified: Secondary | ICD-10-CM

## 2023-01-12 NOTE — Telephone Encounter (Signed)
Call for appt

## 2023-01-13 ENCOUNTER — Telehealth (INDEPENDENT_AMBULATORY_CARE_PROVIDER_SITE_OTHER): Payer: Medicaid Other | Admitting: Psychiatry

## 2023-01-13 ENCOUNTER — Encounter (HOSPITAL_COMMUNITY): Payer: Self-pay | Admitting: Psychiatry

## 2023-01-13 DIAGNOSIS — F4310 Post-traumatic stress disorder, unspecified: Secondary | ICD-10-CM

## 2023-01-13 DIAGNOSIS — F321 Major depressive disorder, single episode, moderate: Secondary | ICD-10-CM | POA: Diagnosis not present

## 2023-01-13 MED ORDER — VORTIOXETINE HBR 20 MG PO TABS: 20.0000 mg | ORAL_TABLET | Freq: Every day | ORAL | 2 refills | Status: DC

## 2023-01-13 MED ORDER — ALPRAZOLAM 1 MG PO TABS
1.0000 mg | ORAL_TABLET | Freq: Every day | ORAL | 0 refills | Status: DC | PRN
Start: 2023-01-13 — End: 2023-03-09

## 2023-01-13 NOTE — Progress Notes (Signed)
Virtual Visit via Video Note  I connected with Karen Shields on 01/13/23 at 11:20 AM EDT by a video enabled telemedicine application and verified that I am speaking with the correct person using two identifiers.  Location: Patient: home Provider: office   I discussed the limitations of evaluation and management by telemedicine and the availability of in person appointments. The patient expressed understanding and agreed to proceed.     I discussed the assessment and treatment plan with the patient. The patient was provided an opportunity to ask questions and all were answered. The patient agreed with the plan and demonstrated an understanding of the instructions.   The patient was advised to call back or seek an in-person evaluation if the symptoms worsen or if the condition fails to improve as anticipated.  I provided 15 minutes of non-face-to-face time during this encounter.   [name], MD  [facility] MD/PA/NP OP Progress Note  01/13/2023 11:32 AM Karen Shields  MRN:  [id number]  Chief Complaint:  Chief Complaint  Patient presents with   Depression   Anxiety   Follow-up   HPI: This patient is a 32 year old separated white female who lives with her mother and 2 daughters in [place].  She is a Press photographer.  The patient returns for follow-up after about 3 months regarding her depression and anxiety.  For the most part she has been doing fairly well.  She has had some problems with spasms in her legs and is supposed to have a nerve conduction study.  She was given Flexeril but she thinks maybe it caused her to feel choked and have an allergic reaction so I suggested maybe not taking it anymore.  She denies significant depression or anxiety and feels like she is doing well on her current regimen.  She is sleeping well and her energy is good.  She denies any thoughts of self-harm or suicide.  She states that she is only taking the Suboxone for pain sporadically Visit  Diagnosis:    ICD-10-CM   1. Current moderate episode of major depressive disorder without prior episode  F32.1     2. PTSD (post-traumatic stress disorder)  F43.10 vortioxetine HBr (TRINTELLIX) 20 MG TABS tablet    ALPRAZolam (XANAX) 1 MG tablet      Past Psychiatric History: Past treatment at [facility] She had 1 hospitalization 6 years ago for depression with suicidal ideation   Past Medical History:  Past Medical History:  Diagnosis Date   Anxiety    Asthma    WELL CONTROLLED   Asthma    Depression    pt stated   Eczema 2008   Gallstones    GERD (gastroesophageal reflux disease)    Headache    Hx MRSA infection 09-2014   IBS (irritable bowel syndrome)    Ovarian cyst     Past Surgical History:  Procedure Laterality Date   CHOLECYSTECTOMY N/A 01/16/2016   Procedure: LAPAROSCOPIC CHOLECYSTECTOMY;  Surgeon: Lattie Haw, MD;  Location: [facility] ORS;  Service: General;  Laterality: N/A;   ESOPHAGOGASTRODUODENOSCOPY N/A 11/13/2015   XLK:GMWNUUV reflux    OVARIAN CYST REMOVAL     OVARIAN CYST REMOVAL  2011   TUBAL LIGATION     WISDOM TOOTH EXTRACTION      Family Psychiatric History: See below  Family History:  Family History  Problem Relation Age of Onset   Migraines Mother    Diabetes Mother    Anxiety disorder Mother    Depression Mother  Depression Maternal Grandmother    Stroke Maternal Grandfather    Dementia Paternal Grandfather    Colon cancer Neg Hx    Crohn's disease Neg Hx    Celiac disease Neg Hx     Social History:  Social History   Socioeconomic History   Marital status: Legally Separated    Spouse name: Not on file   Number of children: 2   Years of education: Not on file   Highest education level: Not on file  Occupational History   Not on file  Tobacco Use   Smoking status: Every Day    Packs/day: 0.75    Years: 5.00    Additional pack years: 0.00    Total pack years: 3.75    Types: Cigarettes    Start date: 02/11/2006   Smokeless  tobacco: Never  Vaping Use   Vaping Use: Former  Substance and Sexual Activity   Alcohol use: Yes    Alcohol/week: 0.0 standard drinks of alcohol    Comment: rarely, maybe once a year   Drug use: Not Currently    Types: Benzodiazepines, Opium   Sexual activity: Yes    Partners: Male    Birth control/protection: Surgical  Other Topics Concern   Not on file  Social History Narrative   ** Merged History Encounter **    Four daughters between herself and husband, two Human resources officer.    Social Determinants of Health   Financial Resource Strain: Not on file  Food Insecurity: Not on file  Transportation Needs: Not on file  Physical Activity: Not on file  Stress: Not on file  Social Connections: Not on file    Allergies:  Allergies  Allergen Reactions   Aloe Swelling   Latex Dermatitis    Metabolic Disorder Labs: Lab Results  Component Value Date   HGBA1C 5.6 11/12/2022   No results found for: "PROLACTIN" Lab Results  Component Value Date   CHOL 157 11/12/2022   TRIG 73 11/12/2022   HDL 43 11/12/2022   CHOLHDL 3.7 11/12/2022   LDLCALC 100 (H) 11/12/2022   LDLCALC 110 (H) 08/09/2022   Lab Results  Component Value Date   TSH 0.995 11/12/2022   TSH 2.020 08/09/2022    Therapeutic Level Labs: No results found for: "LITHIUM" No results found for: "VALPROATE" No results found for: "CBMZ"  Current Medications: Current Outpatient Medications  Medication Sig Dispense Refill   ALPRAZolam (XANAX) 1 MG tablet Take 1 tablet (1 mg total) by mouth daily as needed for anxiety. 30 tablet 0   budesonide-formoterol (SYMBICORT) 80-4.5 MCG/ACT inhaler Inhale 2 puffs into the lungs 2 (two) times daily. 1 each 3   buprenorphine-naloxone (SUBOXONE) 8-2 mg SUBL SL tablet Place 1 tablet under the tongue 2 (two) times daily.     cetirizine (ZYRTEC) 10 MG tablet Take 10 mg by mouth daily as needed.     Cholecalciferol (VITAMIN D3) 5000 units CAPS Take by mouth.     cyclobenzaprine  (FLEXERIL) 10 MG tablet Take 1 tablet (10 mg total) by mouth 3 (three) times daily as needed for muscle spasms. 30 tablet 0   dicyclomine (BENTYL) 20 MG tablet Take 1 tablet (20 mg total) by mouth in the morning, at noon, in the evening, and at bedtime. 60 tablet 1   loperamide (IMODIUM A-D) 2 MG tablet Take 1 tablet (2 mg total) by mouth 4 (four) times daily as needed for diarrhea or loose stools. 30 tablet 0   montelukast (SINGULAIR) 10 MG tablet Take  1 tablet (10 mg total) by mouth daily. 30 tablet 2   nystatin cream (MYCOSTATIN) Apply topically 2 (two) times daily.     nystatin-triamcinolone ointment (MYCOLOG) Apply 1 Application topically 2 (two) times daily. 30 g 0   ondansetron (ZOFRAN) 4 MG tablet Take 1 tablet (4 mg total) by mouth every 8 (eight) hours as needed for nausea or vomiting. 30 tablet 1   pantoprazole (PROTONIX) 40 MG tablet Take 1 tablet (40 mg total) by mouth daily. 30 tablet 1   phenazopyridine (PYRIDIUM) 100 MG tablet Take 100 mg by mouth 3 (three) times daily as needed.     SUBOXONE 8-2 MG FILM Place under the tongue 2 (two) times daily.     vortioxetine HBr (TRINTELLIX) 20 MG TABS tablet Take 1 tablet (20 mg total) by mouth daily. 30 tablet 2   No current facility-administered medications for this visit.     Musculoskeletal: Strength & Muscle Tone: within normal limits Gait & Station: normal Patient leans: N/A  Psychiatric Specialty Exam: Review of Systems  Musculoskeletal:  Positive for myalgias.  All other systems reviewed and are negative.   There were no vitals taken for this visit.There is no height or weight on file to calculate BMI.  General Appearance: Casual and Fairly Groomed  Eye Contact:  Good  Speech:  Clear and Coherent  Volume:  Normal  Mood:  Euthymic  Affect:  Congruent  Thought Process:  Goal Directed  Orientation:  Full (Time, Place, and Person)  Thought Content: WDL   Suicidal Thoughts:  No  Homicidal Thoughts:  No  Memory:   Immediate;   Good Recent;   Good Remote;   NA  Judgement:  Good  Insight:  Fair  Psychomotor Activity:  Normal  Concentration:  Concentration: Good and Attention Span: Good  Recall:  Good  Fund of Knowledge: Good  Language: Good  Akathisia:  No  Handed:  Right  AIMS (if indicated): not done  Assets:  Communication Skills Desire for Improvement Physical Health Resilience Social Support Talents/Skills  ADL's:  Intact  Cognition: WNL  Sleep:  Good   Screenings: AIMS    Flowsheet Row [facility]  AIMS Total Score 0      AUDIT    Flowsheet Row [facility]  Alcohol Use Disorder Identification Test Final Score (AUDIT) 2      GAD-7    Flowsheet Row Office Visit from 11/05/2022 in [facility] Counselor from 04/21/2022 in [facility] at [place] Counselor from 01/05/2022 in [facility] at [place] Counselor from 05/11/2021 in [facility] Outpatient [facility]al [facility] at [place] Counselor from 04/27/2021 in [facility] at [place]  Total GAD-7 Score PHQ2-9    Flowsheet Row Office Visit from 11/05/2022 in [facility] Office Visit from 08/05/2022 in [facility] Video Visit from 06/21/2022 in [facility] Outpatient [facility] at [place] Office Visit from 05/05/2022 in [facility] Primary Care Counselor from 04/21/2022 in [facility]al Health at [facility] Total Score 3 2 0 0 1  PHQ-9 Total Score 15 7 -- -- --      Flowsheet Row ED to Hosp-Admission (Discharged) from 08/27/2022 in [place] [facility] from 07/20/2022 in [facility]  Care at [facility] Video Visit from 06/21/2022 in [facility]  Outpatient Behavioral Health at [place]  C-SSRS RISK CATEGORY No Risk No Risk No Risk        Assessment and Plan: This patient is a 32 year old female with a history of depression and anxiety.  She is doing well on her current regimen.  She will continue Trintellix 20 mg daily for depression and Xanax 1 mg daily as needed for anxiety.  She will return to see me in 3 months  Collaboration of Care: Collaboration of Care: Referral or follow-up with counselor/therapist AEB patient will continue therapy with [name] in our office  Patient/Guardian was advised Release of Information must be obtained prior to any record release in order to collaborate their care with an outside provider. Patient/Guardian was advised if they have not already done so to contact the registration department to sign all necessary forms in order for [place] to release information regarding their care.   Consent: Patient/Guardian gives verbal consent for treatment and assignment of benefits for services provided during this visit. Patient/Guardian expressed understanding and agreed to proceed.    [name], MD 01/13/2023, 11:32 AM

## 2023-01-19 ENCOUNTER — Encounter: Payer: Self-pay | Admitting: Emergency Medicine

## 2023-01-19 ENCOUNTER — Ambulatory Visit
Admission: EM | Admit: 2023-01-19 | Discharge: 2023-01-19 | Disposition: A | Discharge status: Home / Self Care | Payer: Medicaid Other | Attending: Nurse Practitioner | Admitting: Nurse Practitioner

## 2023-01-19 ENCOUNTER — Other Ambulatory Visit: Payer: Self-pay

## 2023-01-19 DIAGNOSIS — N3000 Acute cystitis without hematuria: Secondary | ICD-10-CM | POA: Diagnosis not present

## 2023-01-19 LAB — POCT URINALYSIS DIP (MANUAL ENTRY)
Bilirubin, UA: NEGATIVE
Glucose, UA: NEGATIVE mg/dL
Nitrite, UA: POSITIVE — AB
Spec Grav, UA: >=1.03 — AB (ref 1.010–1.025)
Urobilinogen, UA: 0.2 E.U./dL
pH, UA: 5.5 (ref 5.0–8.0)

## 2023-01-19 MED ORDER — NITROFURANTOIN MONOHYD MACRO 100 MG PO CAPS: 100.0000 mg | ORAL_CAPSULE | Freq: Two times a day (BID) | ORAL | 0 refills | Status: DC

## 2023-01-19 MED ORDER — PHENAZOPYRIDINE HCL 100 MG PO TABS: 100.0000 mg | ORAL_TABLET | Freq: Three times a day (TID) | ORAL | 0 refills | Status: DC | PRN

## 2023-01-19 NOTE — ED Triage Notes (Signed)
Pt reports urinary frequency, chills, abd pain for last several weeks. Pt denies fever. Pt reports had leftover abx that started last Friday, took two doses a day x5 days on 01/07/23.

## 2023-01-19 NOTE — ED Provider Notes (Signed)
[facility]    CSN: [id number] Arrival date & time: 01/19/23  1638      History   Chief Complaint Chief Complaint  Patient presents with   Urinary Frequency    HPI LEONI GOODNESS is a 32 y.o. female.   The history is provided by the patient.   Patient presents for urinary symptoms that been present for the past 1 to 2 weeks.  Patient complains of pain with urination, urinary frequency, urgency, foul-smelling urine, and low back pain.  Patient denies fever, chills, abdominal pain, nausea, vomiting, hematuria, decreased urine stream, and vaginal symptoms.  Patient states her last UTI was in November of last year.  She reports that she did have an old prescription of amoxicillin that contained 9 pills.  She states that she took the amoxicillin the first week her symptoms started.  She states she thought she was getting better amoxicillin.  Patient denies history of kidney stones or kidney infection.  Last menstrual cycle 01/18/2023.  Past Medical History:  Diagnosis Date   Anxiety    Asthma    WELL CONTROLLED   Asthma    Depression    pt stated   Eczema 2008   Gallstones    GERD (gastroesophageal reflux disease)    Headache    Hx MRSA infection 09-2014   IBS (irritable bowel syndrome)    Ovarian cyst     Patient Active Problem List   Diagnosis Date Noted   Muscle spasm 12/16/2022   IBS (irritable bowel syndrome) 11/05/2022   Cough 08/29/2022   Acute respiratory failure with hypoxia (HCC) 08/29/2022   Asthma exacerbation 08/27/2022   Asthma 08/05/2022   Nipple discharge in female 08/05/2022   Cervical cancer screening 05/05/2022   Cystitis 03/17/2022   Tobacco use 03/09/2022   MDD (major depressive disorder), recurrent episode, severe (HCC) 12/22/2017   Gallstone    RUQ pain 12/25/2015   Gallstones    Reflux esophagitis    Nausea without vomiting 11/04/2015   Cholelithiases 11/04/2015   GERD (gastroesophageal reflux disease) 11/04/2015   Early  satiety 11/04/2015   MRSA (methicillin resistant staph aureus) culture positive 12/11/2014   Menstrual migraine without status migrainosus, not intractable 07/10/2014   Anxiety 06/14/2013   Maternal substance abuse (HCC) 04/14/2013   H/O cold sores 04/12/2013   Ovarian cyst, left 02/09/2011    Past Surgical History:  Procedure Laterality Date   CHOLECYSTECTOMY N/A 01/16/2016   Procedure: LAPAROSCOPIC CHOLECYSTECTOMY;  Surgeon: Lattie Haw, MD;  Location: [facility] ORS;  Service: General;  Laterality: N/A;   ESOPHAGOGASTRODUODENOSCOPY N/A 11/13/2015   NWG:NFAOZHY reflux    OVARIAN CYST REMOVAL     OVARIAN CYST REMOVAL  2011   TUBAL LIGATION     WISDOM TOOTH EXTRACTION      OB History     Gravida  2   Para  2   Term  1   Preterm  0   AB  0   Living  2      SAB  0   IAB  0   Ectopic  0   Multiple  0   Live Births  1            Home Medications    Prior to Admission medications   Medication Sig Start Date End Date Taking? Authorizing Provider  nitrofurantoin, macrocrystal-monohydrate, (MACROBID) 100 MG capsule Take 1 capsule (100 mg total) by mouth 2 (two) times daily. 01/19/23  Yes Adeline Petitfrere-Warren, Sadie Haber, NP  phenazopyridine (PYRIDIUM) 100 MG tablet Take 1 tablet (100 mg total) by mouth 3 (three) times daily as needed for pain. 01/19/23  Yes Peyton Spengler-Warren, Sadie Haber, NP  ALPRAZolam Prudy Feeler) 1 MG tablet Take 1 tablet (1 mg total) by mouth daily as needed for anxiety. 01/13/23   Myrlene Broker, MD  budesonide-formoterol [facility]) 80-4.5 MCG/ACT inhaler Inhale 2 puffs into the lungs 2 (two) times daily. 08/09/22   Gilmore Laroche, FNP  buprenorphine-naloxone (SUBOXONE) 8-2 mg SUBL SL tablet Place 1 tablet under the tongue 2 (two) times daily. 01/19/22   [provider]  cetirizine (ZYRTEC) 10 MG tablet Take 10 mg by mouth daily as needed.    [provider]  Cholecalciferol (VITAMIN D3) 5000 units CAPS Take by mouth.    [provider]   cyclobenzaprine (FLEXERIL) 10 MG tablet Take 1 tablet (10 mg total) by mouth 3 (three) times daily as needed for muscle spasms. 12/16/22   Del Nigel Berthold, FNP  dicyclomine (BENTYL) 20 MG tablet Take 1 tablet (20 mg total) by mouth in the morning, at noon, in the evening, and at bedtime. 11/05/22   Gilmore Laroche, FNP  loperamide (IMODIUM A-D) 2 MG tablet Take 1 tablet (2 mg total) by mouth 4 (four) times daily as needed for diarrhea or loose stools. 11/05/22   Gilmore Laroche, FNP  montelukast (SINGULAIR) 10 MG tablet Take 1 tablet (10 mg total) by mouth daily. 08/29/22 08/29/23  Vassie Loll, MD  nystatin cream (MYCOSTATIN) Apply topically 2 (two) times daily. 08/09/22   [provider]  nystatin-triamcinolone ointment (MYCOLOG) Apply 1 Application topically 2 (two) times daily. 08/09/22   Gilmore Laroche, FNP  ondansetron (ZOFRAN) 4 MG tablet Take 1 tablet (4 mg total) by mouth every 8 (eight) hours as needed for nausea or vomiting. 11/05/22   Gilmore Laroche, FNP  pantoprazole (PROTONIX) 40 MG tablet Take 1 tablet (40 mg total) by mouth daily. 11/05/22 01/04/23  Gilmore Laroche, FNP  SUBOXONE 8-2 MG FILM Place under the tongue 2 (two) times daily. 10/12/22   [provider]  vortioxetine HBr (TRINTELLIX) 20 MG TABS tablet Take 1 tablet (20 mg total) by mouth daily. 01/13/23   Myrlene Broker, MD    Family History Family History  Problem Relation Age of Onset   Migraines Mother    Diabetes Mother    Anxiety disorder Mother    Depression Mother    Depression Maternal Grandmother    Stroke Maternal Grandfather    Dementia Paternal Grandfather    Colon cancer Neg Hx    Crohn's disease Neg Hx    Celiac disease Neg Hx     Social History Social History   Tobacco Use   Smoking status: Every Day    Packs/day: 0.75    Years: 5.00    Additional pack years: 0.00    Total pack years: 3.75    Types: Cigarettes    Start date: 02/11/2006   Smokeless tobacco: Never   Vaping Use   Vaping Use: Former  Substance Use Topics   Alcohol use: Yes    Alcohol/week: 0.0 standard drinks of alcohol    Comment: rarely, maybe once a year   Drug use: Not Currently    Types: Benzodiazepines, Opium     Allergies   Aloe and Latex   Review of Systems Review of Systems Per HPI  Physical Exam Triage Vital Signs ED Triage Vitals  Enc Vitals Group     BP 01/19/23 1746 (!) 135/95  Pulse Rate 01/19/23 1746 (!) 103     Resp 01/19/23 1746 20     Temp 01/19/23 1746 98.9 F (37.2 C)     Temp Source 01/19/23 1746 Oral     SpO2 01/19/23 1746 96 %     Weight --      Height --      Head Circumference --      Peak Flow --      Pain Score 01/19/23 1749 0     Pain Loc --      Pain Edu? --      Excl. in GC? --    No data found.  Updated Vital Signs BP (!) 135/95 (BP Location: Right Arm)   Pulse (!) 103   Temp 98.9 F (37.2 C) (Oral)   Resp 20   LMP 01/18/2023 (Approximate)   SpO2 96%   Visual Acuity Right Eye Distance:   Left Eye Distance:   Bilateral Distance:    Right Eye Near:   Left Eye Near:    Bilateral Near:     Physical Exam Vitals and nursing note reviewed.  Constitutional:      General: She is not in acute distress.    Appearance: Normal appearance.  HENT:     Head: Normocephalic.  Eyes:     Extraocular Movements: Extraocular movements intact.     Pupils: Pupils are equal, round, and reactive to light.  Cardiovascular:     Rate and Rhythm: Regular rhythm.     Pulses: Normal pulses.     Heart sounds: Normal heart sounds.  Pulmonary:     Effort: Pulmonary effort is normal.     Breath sounds: Normal breath sounds.  Abdominal:     General: Bowel sounds are normal.     Palpations: Abdomen is soft.     Tenderness: There is abdominal tenderness in the suprapubic area. There is right CVA tenderness.  Musculoskeletal:     Cervical back: Normal range of motion.  Lymphadenopathy:     Cervical: No cervical adenopathy.  Skin:     General: Skin is warm and dry.  Neurological:     General: No focal deficit present.     Mental Status: She is alert and oriented to person, place, and time.  Psychiatric:        Mood and Affect: Mood normal.        Behavior: Behavior normal.      UC Treatments / Results  Labs (all labs ordered are listed, but only abnormal results are displayed) Labs Reviewed  POCT URINALYSIS DIP (MANUAL ENTRY) - Abnormal; Notable for the following components:      Result Value   Clarity, UA cloudy (*)    Ketones, POC UA trace (5) (*)    Spec Grav, UA >=1.030 (*)    Blood, UA large (*)    Protein Ur, POC trace (*)    Nitrite, UA Positive (*)    Leukocytes, UA Moderate (2+) (*)    All other components within normal limits  URINE CULTURE    EKG   Radiology No results found.  Procedures Procedures (including critical care time)  Medications Ordered in UC Medications - No data to display  Initial Impression / Assessment and Plan / UC Course  I have reviewed the triage vital signs and the nursing notes.  Pertinent labs & imaging results that were available during my care of the patient were reviewed by me and considered in my medical decision making (see chart  for details).  The patient is well-appearing, she is in no acute distress, vital signs are stable.  Urinalysis is positive for urinary tract infection.  Will treat with Macrobid 100 mg.  Patient was prescribed Pyridium 100 mg to help with dysuria.  Supportive care recommendations were provided and discussed with the patient to include over-the-counter analgesics for pain or discomfort, increasing her water intake, developing a toileting schedule, and avoiding caffeine while symptoms persist.  Urine culture is pending.  Patient advised she will be contacted if the urine culture results indicates that the antibiotic prescribed today needs to be changed.  Patient is in agreement with this plan of care and verbalizes understanding.  All  questions were answered.  Patient stable for discharge.   Final Clinical Impressions(s) / UC Diagnoses   Final diagnoses:  Acute cystitis without hematuria     Discharge Instructions      The urinalysis shows you have a urinary tract infection.  A urine culture has been ordered to ensure you are being treated with the correct antibiotic.  If the medication needs to be changed, you will be contacted. May take over-the-counter Tylenol or ibuprofen as needed for pain, fever, or general discomfort. Increase fluids and allow for plenty of rest.  Try to drink at least 10-12 8 ounce glasses of water while symptoms persist. Avoid caffeine while symptoms persist such as tea, soda, and coffee. Develop a toileting schedule that allows you to urinate every 2 hours. If you are sexually active, make sure you are voiding at least 15 to 20 minutes after sexual intercourse. If you develop worsening symptoms while being on the medication to include fever, chills, worsening urinary symptoms, please go to the emergency department for further evaluation. Follow-up as needed.      ED Prescriptions     Medication Sig Dispense Auth. Provider   nitrofurantoin, macrocrystal-monohydrate, (MACROBID) 100 MG capsule Take 1 capsule (100 mg total) by mouth 2 (two) times daily. 10 capsule Minda Faas-Warren, Sadie Haber, NP   phenazopyridine (PYRIDIUM) 100 MG tablet Take 1 tablet (100 mg total) by mouth 3 (three) times daily as needed for pain. 10 tablet Zadia Uhde-Warren, Sadie Haber, NP      PDMP not reviewed this encounter.   Abran Cantor, NP 01/19/23 1818

## 2023-01-19 NOTE — Discharge Instructions (Signed)
The urinalysis shows you have a urinary tract infection.  A urine culture has been ordered to ensure you are being treated with the correct antibiotic.  If the medication needs to be changed, you will be contacted. May take over-the-counter Tylenol or ibuprofen as needed for pain, fever, or general discomfort. Increase fluids and allow for plenty of rest.  Try to drink at least 10-12 8 ounce glasses of water while symptoms persist. Avoid caffeine while symptoms persist such as tea, soda, and coffee. Develop a toileting schedule that allows you to urinate every 2 hours. If you are sexually active, make sure you are voiding at least 15 to 20 minutes after sexual intercourse. If you develop worsening symptoms while being on the medication to include fever, chills, worsening urinary symptoms, please go to the emergency department for further evaluation. Follow-up as needed.

## 2023-01-20 LAB — URINE CULTURE: Culture: 80000 — AB

## 2023-01-21 LAB — URINE CULTURE

## 2023-01-31 ENCOUNTER — Encounter: Payer: Self-pay | Admitting: Neurology

## 2023-01-31 DIAGNOSIS — R030 Elevated blood-pressure reading, without diagnosis of hypertension: Secondary | ICD-10-CM | POA: Diagnosis not present

## 2023-01-31 DIAGNOSIS — E669 Obesity, unspecified: Secondary | ICD-10-CM | POA: Diagnosis not present

## 2023-01-31 DIAGNOSIS — Z6831 Body mass index (BMI) 31.0-31.9, adult: Secondary | ICD-10-CM | POA: Diagnosis not present

## 2023-01-31 DIAGNOSIS — N390 Urinary tract infection, site not specified: Secondary | ICD-10-CM | POA: Diagnosis not present

## 2023-02-04 ENCOUNTER — Ambulatory Visit: Payer: Medicaid Other | Admitting: Family Medicine

## 2023-02-08 ENCOUNTER — Ambulatory Visit: Payer: Medicaid Other | Admitting: Neurology

## 2023-02-08 ENCOUNTER — Ambulatory Visit (HOSPITAL_COMMUNITY): Payer: Medicaid Other | Admitting: Psychiatry

## 2023-02-08 DIAGNOSIS — R200 Anesthesia of skin: Secondary | ICD-10-CM | POA: Diagnosis not present

## 2023-02-08 NOTE — Procedures (Signed)
[facility]  [address] [place], [address]  [place], [place] [address] Tel: [phone] Fax: [phone] Test Date:  02/08/2023  Patient: Karen Shields DOB: 01/09/1991 Physician: Jacquelyne Balint, MD  Sex: Female Height: 5\' 3"  Ref Phys: Shon Millet, DO  ID#: [id number]   Technician:    History: This is a 32 year old female with numbness and cramps in lower limbs.  NCV & EMG Findings: Extensive electrodiagnostic evaluation of the right and left lower limbs shows: Bilateral sural and superficial peroneal/fibular sensory responses are within normal limits. Bilateral peroneal/fibular (EDB) and tibial (AH) motor responses are within normal limits. Bilateral H reflex latency is within normal limits. There is no evidence of active or chronic motor axon loss changes affecting any of the tested muscles. Motor unit configuration and recruitment pattern is within normal limits.  Impression: This is a normal study of the right and left lower limbs. In particular, there is no electrodiagnostic evidence of a right or left lumbosacral (L3-S1) radiculopathy, large fiber sensorimotor neuropathy, or myopathy.    ___________________________ Jacquelyne Balint, MD    Nerve Conduction Studies Motor Nerve Results    Latency Amplitude F-Lat Segment Distance CV Comment  Site (ms) Norm (mV) Norm (ms)  (cm) (m/s) Norm   Left Fibular (EDB) Motor  Ankle 2.9  < 5.5 4.4  > 3.0        Bel fib head 8.6 - 4.1 -  Bel fib head-Ankle 29 51  > 40   Pop fossa 10.2 - 4.1 -  Pop fossa-Bel fib head 8 50 -   Right Fibular (EDB) Motor  Ankle 2.7  < 5.5 5.3  > 3.0        Bel fib head 8.6 - 4.9 -  Bel fib head-Ankle 29 49  > 40   Pop fossa 10.2 - 4.8 -  Pop fossa-Bel fib head 7.5 47 -   Left Tibial (AH) Motor  Ankle 2.5  < 6.0 18.1  > 8.0        Knee 10.2 - 14.1 -  Knee-Ankle 39 51  > 40   Right Tibial (AH) Motor  Ankle 2.7  < 6.0 17.7  > 8.0        Knee 10.0 - 12.3 -  Knee-Ankle 38 52  > 40    Sensory  Sites    Neg Peak Lat Amplitude (O-P) Segment Distance Velocity Comment  Site (ms) Norm (V) Norm  (cm) (ms)   Left Superficial Fibular Sensory  14 cm-Ankle 2.4  < 4.5 28  > 5 14 cm-Ankle 14    Right Superficial Fibular Sensory  14 cm-Ankle 2.3  < 4.5 30  > 5 14 cm-Ankle 14    Left Sural Sensory  Calf-Lat mall 3.3  < 4.5 16  > 5 Calf-Lat mall 14    Right Sural Sensory  Calf-Lat mall 3.1  < 4.5 16  > 5 Calf-Lat mall 14     H-Reflex Results    M-Lat H Lat H Neg Amp H-M Lat  Site (ms) (ms) Norm (mV) (ms)  Left Tibial H-Reflex  Pop fossa 4.1 29.1  < 35.0 1.58 25.0  Right Tibial H-Reflex  Pop fossa 5.0 30.1  < 35.0 1.21 25.1   Electromyography   Side Muscle Ins.Act Fibs Fasc Recrt Amp Dur Poly Activation Comment  Left Tib ant Nml Nml Nml Nml Nml Nml Nml Nml N/A  Left Gastroc MH Nml Nml Nml Nml Nml Nml Nml Nml N/A  Left Rectus  fem Nml Nml Nml Nml Nml Nml Nml Nml N/A  Left Biceps fem SH Nml Nml Nml Nml Nml Nml Nml Nml N/A  Left Gluteus med Nml Nml Nml Nml Nml Nml Nml Nml N/A  Right Tib ant Nml Nml Nml Nml Nml Nml Nml Nml N/A  Right Gastroc MH Nml Nml Nml Nml Nml Nml Nml Nml N/A  Right Rectus fem Nml Nml Nml Nml Nml Nml Nml Nml N/A  Right Biceps fem SH Nml Nml Nml Nml Nml Nml Nml Nml N/A  Right Gluteus med Nml Nml Nml Nml Nml Nml Nml Nml N/A      Waveforms:  Motor           Sensory           H-Reflex

## 2023-02-10 ENCOUNTER — Other Ambulatory Visit: Payer: Self-pay

## 2023-02-10 DIAGNOSIS — R200 Anesthesia of skin: Secondary | ICD-10-CM

## 2023-02-10 DIAGNOSIS — G43109 Migraine with aura, not intractable, without status migrainosus: Secondary | ICD-10-CM

## 2023-02-22 ENCOUNTER — Ambulatory Visit (INDEPENDENT_AMBULATORY_CARE_PROVIDER_SITE_OTHER): Payer: Medicaid Other | Admitting: Internal Medicine

## 2023-02-22 ENCOUNTER — Ambulatory Visit: Payer: Medicaid Other | Admitting: Family Medicine

## 2023-02-22 ENCOUNTER — Ambulatory Visit: Payer: Medicaid Other | Admitting: Internal Medicine

## 2023-02-22 ENCOUNTER — Encounter: Payer: Self-pay | Admitting: Internal Medicine

## 2023-02-22 VITALS — BP 111/78 | HR 105 | Ht 64.0 in | Wt 181.0 lb

## 2023-02-22 DIAGNOSIS — N3001 Acute cystitis with hematuria: Secondary | ICD-10-CM | POA: Diagnosis not present

## 2023-02-22 DIAGNOSIS — R300 Dysuria: Secondary | ICD-10-CM

## 2023-02-22 LAB — POCT URINALYSIS DIP (CLINITEK)
Bilirubin, UA: NEGATIVE
Glucose, UA: NEGATIVE mg/dL
Nitrite, UA: POSITIVE — AB
POC PROTEIN,UA: 100 — AB
Spec Grav, UA: >=1.03 — AB (ref 1.010–1.025)
Urobilinogen, UA: 0.2 E.U./dL
pH, UA: 5.5 (ref 5.0–8.0)

## 2023-02-22 MED ORDER — SULFAMETHOXAZOLE-TRIMETHOPRIM 800-160 MG PO TABS
1.0000 | ORAL_TABLET | Freq: Two times a day (BID) | ORAL | 0 refills | Status: AC
Start: 2023-02-22 — End: 2023-02-27

## 2023-02-22 NOTE — Assessment & Plan Note (Signed)
Presenting today for an acute visit endorsing symptoms concerning for UTI as noted above.  Symptoms began Saturday (6/1).  Most recently treated for UTI in early May.  Treated with Macrobid.  Grew E. coli at that time resistant to ampicillin and ampicillin sulbactam.  UA today shows moderate blood, nitrite positive, large leukocyte esterase. -Given current symptoms, UA results, and recent history, Bactrim DS x 5 days has been prescribed for treatment of acute cystitis with hematuria. -Follow-up urine culture -She was instructed to return to care if her symptoms worsen or fail to improve

## 2023-02-22 NOTE — Addendum Note (Signed)
Addended by: Telford Nab on: 02/22/2023 11:48 AM   Modules accepted: Orders

## 2023-02-22 NOTE — Patient Instructions (Addendum)
It was a pleasure to see you today.  Thank you for giving [place] the opportunity to be involved in your care.  Below is a brief recap of your visit and next steps.  We will plan to see you again as needed.  Summary Bactrim prescribed for UTI Follow up if symptoms worsen or fail to improve

## 2023-02-22 NOTE — Progress Notes (Signed)
Acute Office Visit  Subjective:     Patient ID: Karen Shields, female    DOB: [birth year], 32 y.o.   MRN: [id number]  Chief Complaint  Patient presents with   Urinary Tract Infection    Symptoms: cramping, frequency, nauseous, and not able to eat much. started 02/19/2023. Been taking azo    Karen Shields presents for an acute visit today endorsing symptoms concerning for UTI.  She endorses a 3-day history of lower abdominal cramping, intermittent low back pain, and loss of appetite.  These are symptoms she has previously experienced when she has had a urinary tract infection.  Most recently treated for UTI in early May.  Treated with Macrobid at that time.  She denies dysuria, increased urinary frequency, and fever/chills.  Review of Systems  Constitutional:  Negative for chills and fever.  Gastrointestinal:  Positive for abdominal pain.  Genitourinary:  Positive for flank pain. Negative for dysuria, frequency, hematuria and urgency.      Objective:    BP 111/78   Pulse (!) 105   Ht 5\' 4"  (1.626 m)   Wt 181 lb (82.1 kg)   SpO2 95%   BMI 31.07 kg/m   Physical Exam Vitals reviewed.  Constitutional:      General: She is not in acute distress.    Appearance: Normal appearance. She is not toxic-appearing.  HENT:     Head: Normocephalic and atraumatic.     Right Ear: External ear normal.     Left Ear: External ear normal.     Nose: Nose normal. No congestion or rhinorrhea.     Mouth/Throat:     Mouth: Mucous membranes are moist.     Pharynx: Oropharynx is clear. No oropharyngeal exudate or posterior oropharyngeal erythema.  Eyes:     General: No scleral icterus.    Extraocular Movements: Extraocular movements intact.     Conjunctiva/sclera: Conjunctivae normal.     Pupils: Pupils are equal, round, and reactive to light.  Cardiovascular:     Rate and Rhythm: Normal rate and regular rhythm.     Pulses: Normal pulses.     Heart sounds: Normal heart sounds. No murmur heard.     No friction rub. No gallop.  Pulmonary:     Effort: Pulmonary effort is normal.     Breath sounds: Normal breath sounds. No wheezing, rhonchi or rales.  Abdominal:     General: Abdomen is flat. Bowel sounds are normal. There is no distension.     Palpations: Abdomen is soft.     Tenderness: There is no abdominal tenderness.  Musculoskeletal:        General: No swelling. Normal range of motion.     Cervical back: Normal range of motion.     Right lower leg: No edema.     Left lower leg: No edema.  Lymphadenopathy:     Cervical: No cervical adenopathy.  Skin:    General: Skin is warm and dry.     Capillary Refill: Capillary refill takes less than 2 seconds.     Coloration: Skin is not jaundiced.  Neurological:     General: No focal deficit present.     Mental Status: She is alert and oriented to person, place, and time.  Psychiatric:        Mood and Affect: Mood normal.        Behavior: Behavior normal.    Results for orders placed or performed in visit on 02/22/23  POCT URINALYSIS DIP (CLINITEK)  Result Value Ref Range   Color, UA yellow yellow   Clarity, UA cloudy (A) clear   Glucose, UA negative negative mg/dL   Bilirubin, UA negative negative   Ketones, POC UA trace (5) (A) negative mg/dL   Spec Grav, UA >=4.098 (A) 1.010 - 1.025   Blood, UA moderate (A) negative   pH, UA 5.5 5.0 - 8.0   POC PROTEIN,UA =100 (A) negative, trace   Urobilinogen, UA 0.2 0.2 or 1.0 E.U./dL   Nitrite, UA Positive (A) Negative   Leukocytes, UA Large (3+) (A) Negative      Assessment & Plan:   Problem List Items Addressed This Visit       Acute cystitis with hematuria    Presenting today for an acute visit endorsing symptoms concerning for UTI as noted above.  Symptoms began Saturday (6/1).  Most recently treated for UTI in early May.  Treated with Macrobid.  Grew E. coli at that time resistant to ampicillin and ampicillin sulbactam.  UA today shows moderate blood, nitrite positive, large  leukocyte esterase. -Given current symptoms, UA results, and recent history, Bactrim DS x 5 days has been prescribed for treatment of acute cystitis with hematuria. -Follow-up urine culture -She was instructed to return to care if her symptoms worsen or fail to improve      Meds ordered this encounter  Medications   sulfamethoxazole-trimethoprim (BACTRIM DS) 800-160 MG tablet    Sig: Take 1 tablet by mouth 2 (two) times daily for 5 days.    Dispense:  10 tablet    Refill:  0    Return if symptoms worsen or fail to improve.  Billie Lade, MD

## 2023-02-23 LAB — MICROSCOPIC EXAMINATION

## 2023-02-23 LAB — UA/M W/RFLX CULTURE, ROUTINE

## 2023-02-26 LAB — UA/M W/RFLX CULTURE, ROUTINE: Urobilinogen, Ur: 1 mg/dL (ref 0.2–1.0)

## 2023-02-26 LAB — URINE CULTURE, REFLEX

## 2023-02-26 LAB — MICROSCOPIC EXAMINATION: WBC, UA: >30 /hpf — AB (ref 0–5)

## 2023-02-27 LAB — UA/M W/RFLX CULTURE, ROUTINE
Glucose, UA: NEGATIVE
Nitrite, UA: POSITIVE — AB
Specific Gravity, UA: 1.024 (ref 1.005–1.030)
pH, UA: 5.5 (ref 5.0–7.5)

## 2023-02-27 LAB — MICROSCOPIC EXAMINATION: Casts: NONE SEEN /LPF

## 2023-02-27 LAB — URINE CULTURE, REFLEX

## 2023-03-02 ENCOUNTER — Ambulatory Visit (INDEPENDENT_AMBULATORY_CARE_PROVIDER_SITE_OTHER): Payer: Medicaid Other | Admitting: Psychiatry

## 2023-03-02 DIAGNOSIS — F321 Major depressive disorder, single episode, moderate: Secondary | ICD-10-CM | POA: Diagnosis not present

## 2023-03-02 DIAGNOSIS — F4310 Post-traumatic stress disorder, unspecified: Secondary | ICD-10-CM | POA: Diagnosis not present
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
## 2023-03-08 ENCOUNTER — Telehealth (HOSPITAL_COMMUNITY): Payer: Self-pay | Admitting: *Deleted

## 2023-03-08 NOTE — Telephone Encounter (Signed)
Patient called stating she would like to get refills for her Xanax and her Trintellix. Per pt she would like for it to be sent to [facility].

## 2023-03-09 ENCOUNTER — Other Ambulatory Visit (HOSPITAL_COMMUNITY): Payer: Self-pay | Admitting: Psychiatry

## 2023-03-09 DIAGNOSIS — F4310 Post-traumatic stress disorder, unspecified: Secondary | ICD-10-CM

## 2023-03-09 MED ORDER — VORTIOXETINE HBR 20 MG PO TABS: 20.0000 mg | ORAL_TABLET | Freq: Every day | ORAL | 2 refills | Status: DC

## 2023-03-09 MED ORDER — ALPRAZOLAM 1 MG PO TABS
1.0000 mg | ORAL_TABLET | Freq: Every day | ORAL | 0 refills | Status: DC | PRN
Start: 2023-03-09 — End: 2023-03-17

## 2023-03-09 NOTE — Telephone Encounter (Signed)
sent 

## 2023-03-09 NOTE — Telephone Encounter (Signed)
Pt called in to check on refills. Pt scheduled 03/17/23. Please advise.

## 2023-03-09 NOTE — Telephone Encounter (Signed)
Spoke with pt advised medication has been sent to pharmacy pt verbalized understanding 

## 2023-03-16 ENCOUNTER — Ambulatory Visit (INDEPENDENT_AMBULATORY_CARE_PROVIDER_SITE_OTHER): Payer: Medicaid Other | Admitting: Psychiatry

## 2023-03-16 DIAGNOSIS — F321 Major depressive disorder, single episode, moderate: Secondary | ICD-10-CM | POA: Diagnosis not present
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
## 2023-03-17 ENCOUNTER — Other Ambulatory Visit: Payer: Self-pay | Admitting: Family Medicine

## 2023-03-17 ENCOUNTER — Telehealth (INDEPENDENT_AMBULATORY_CARE_PROVIDER_SITE_OTHER): Payer: Medicaid Other | Admitting: Psychiatry

## 2023-03-17 ENCOUNTER — Encounter (HOSPITAL_COMMUNITY): Payer: Self-pay | Admitting: Psychiatry

## 2023-03-17 DIAGNOSIS — F4310 Post-traumatic stress disorder, unspecified: Secondary | ICD-10-CM | POA: Diagnosis not present

## 2023-03-17 DIAGNOSIS — F321 Major depressive disorder, single episode, moderate: Secondary | ICD-10-CM

## 2023-03-17 MED ORDER — ALPRAZOLAM 1 MG PO TABS
1.0000 mg | ORAL_TABLET | Freq: Every day | ORAL | 0 refills | Status: DC | PRN
Start: 2023-03-17 — End: 2023-05-05

## 2023-03-17 MED ORDER — VORTIOXETINE HBR 10 MG PO TABS: 10.0000 mg | ORAL_TABLET | Freq: Every day | ORAL | 2 refills | Status: DC

## 2023-03-17 NOTE — Progress Notes (Signed)
Virtual Visit via Video Note  I connected with Karen Shields on 03/17/23 at  1:20 PM EDT by a video enabled telemedicine application and verified that I am speaking with the correct person using two identifiers.  Location: Patient: home Provider: office   I discussed the limitations of evaluation and management by telemedicine and the availability of in person appointments. The patient expressed understanding and agreed to proceed.     I discussed the assessment and treatment plan with the patient. The patient was provided an opportunity to ask questions and all were answered. The patient agreed with the plan and demonstrated an understanding of the instructions.   The patient was advised to call back or seek an in-person evaluation if the symptoms worsen or if the condition fails to improve as anticipated.  I provided 15 minutes of non-face-to-face time during this encounter.   [name], MD  [facility] MD/PA/NP OP Progress Note  03/17/2023 1:47 PM Karen Shields  MRN:  [id number]  Chief Complaint:  Chief Complaint  Patient presents with   Depression   Anxiety   Follow-up   HPI: This patient is a 32 year old separated white female who lives with her mother and 2 daughters in [place].  She is a Press photographer.  The patient returns for follow-up after 2 months regarding her depression and anxiety.  For the most part she has been doing fairly well.  She tried on her own to come off Suboxone.  She is now cutting the filamentous tiny strips and just using a little bit at a time.  When she tried to come off entirely she started having withdrawal symptoms such as nausea and bodyaches.  I explained to her that the Suboxone clinic should be helping her by prescribing medicines to help her get through withdrawal.  Overall however she denies significant depression or anxiety.  She denies any thoughts of self-harm or suicide.  She is sleeping well.  Her appetite is good.  She also  states that she has been only taking 10 mg of the Trintellix and it seems to be working just fine. Visit Diagnosis:    ICD-10-CM   1. Current moderate episode of major depressive disorder without prior episode (HCC)  F32.1     2. PTSD (post-traumatic stress disorder)  F43.10 ALPRAZolam (XANAX) 1 MG tablet      Past Psychiatric History: Treatment at [facility] she had 1 hospitalization about 7 years ago for depression with suicidal ideation  Past Medical History:  Past Medical History:  Diagnosis Date   Anxiety    Asthma    WELL CONTROLLED   Asthma    Depression    pt stated   Eczema 2008   Gallstones    GERD (gastroesophageal reflux disease)    Headache    Hx MRSA infection 09-2014   IBS (irritable bowel syndrome)    Ovarian cyst     Past Surgical History:  Procedure Laterality Date   CHOLECYSTECTOMY N/A 01/16/2016   Procedure: LAPAROSCOPIC CHOLECYSTECTOMY;  Surgeon: Lattie Haw, MD;  Location: [facility] ORS;  Service: General;  Laterality: N/A;   ESOPHAGOGASTRODUODENOSCOPY N/A 11/13/2015   WUJ:[id number] reflux    OVARIAN CYST REMOVAL     OVARIAN CYST REMOVAL  2011   TUBAL LIGATION     WISDOM TOOTH EXTRACTION      Family Psychiatric History: See below  Family History:  Family History  Problem Relation Age of Onset   Migraines Mother    Diabetes Mother  Anxiety disorder Mother    Depression Mother    Depression Maternal Grandmother    Stroke Maternal Grandfather    Dementia Paternal Grandfather    Colon cancer Neg Hx    Crohn's disease Neg Hx    Celiac disease Neg Hx     Social History:  Social History   Socioeconomic History   Marital status: Legally Separated    Spouse name: Not on file   Number of children: 2   Years of education: Not on file   Highest education level: Not on file  Occupational History   Not on file  Tobacco Use   Smoking status: Every Day    Packs/day: 0.75    Years: 5.00    Additional pack years: 0.00    Total pack years: 3.75     Types: Cigarettes    Start date: 02/11/2006   Smokeless tobacco: Never  Vaping Use   Vaping Use: Former  Substance and Sexual Activity   Alcohol use: Yes    Alcohol/week: 0.0 standard drinks of alcohol    Comment: rarely, maybe once a year   Drug use: Not Currently    Types: Benzodiazepines, Opium   Sexual activity: Yes    Partners: Male    Birth control/protection: Surgical  Other Topics Concern   Not on file  Social History Narrative   ** Merged History Encounter **    Four daughters between herself and husband, two Human resources officer.    Social Determinants of Health   Financial Resource Strain: Not on file  Food Insecurity: Not on file  Transportation Needs: Not on file  Physical Activity: Not on file  Stress: Not on file  Social Connections: Not on file    Allergies:  Allergies  Allergen Reactions   Aloe Swelling   Latex Dermatitis    Metabolic Disorder Labs: Lab Results  Component Value Date   HGBA1C 5.6 11/12/2022   No results found for: "PROLACTIN" Lab Results  Component Value Date   CHOL 157 11/12/2022   TRIG 73 11/12/2022   HDL 43 11/12/2022   CHOLHDL 3.7 11/12/2022   LDLCALC 100 (H) 11/12/2022   LDLCALC 110 (H) 08/09/2022   Lab Results  Component Value Date   TSH 0.995 11/12/2022   TSH 2.020 08/09/2022    Therapeutic Level Labs: No results found for: "LITHIUM" No results found for: "VALPROATE" No results found for: "CBMZ"  Current Medications: Current Outpatient Medications  Medication Sig Dispense Refill   vortioxetine HBr (TRINTELLIX) 10 MG TABS tablet Take 1 tablet (10 mg total) by mouth daily. 30 tablet 2   ALPRAZolam (XANAX) 1 MG tablet Take 1 tablet (1 mg total) by mouth daily as needed for anxiety. 30 tablet 0   budesonide-formoterol (SYMBICORT) 80-4.5 MCG/ACT inhaler Inhale 2 puffs into the lungs 2 (two) times daily. 1 each 3   buprenorphine-naloxone (SUBOXONE) 8-2 mg SUBL SL tablet Place 1 tablet under the tongue 2 (two) times daily.      cetirizine (ZYRTEC) 10 MG tablet Take 10 mg by mouth daily as needed.     Cholecalciferol (VITAMIN D3) 5000 units CAPS Take by mouth.     cyclobenzaprine (FLEXERIL) 10 MG tablet Take 1 tablet (10 mg total) by mouth 3 (three) times daily as needed for muscle spasms. 30 tablet 0   dicyclomine (BENTYL) 20 MG tablet Take 1 tablet (20 mg total) by mouth in the morning, at noon, in the evening, and at bedtime. 60 tablet 1   loperamide (IMODIUM A-D) 2  MG tablet Take 1 tablet (2 mg total) by mouth 4 (four) times daily as needed for diarrhea or loose stools. 30 tablet 0   montelukast (SINGULAIR) 10 MG tablet Take 1 tablet (10 mg total) by mouth daily. 30 tablet 2   nitrofurantoin, macrocrystal-monohydrate, (MACROBID) 100 MG capsule Take 1 capsule (100 mg total) by mouth 2 (two) times daily. 10 capsule 0   nystatin cream (MYCOSTATIN) Apply topically 2 (two) times daily.     nystatin-triamcinolone ointment (MYCOLOG) Apply 1 Application topically 2 (two) times daily. 30 g 0   ondansetron (ZOFRAN) 4 MG tablet Take 1 tablet (4 mg total) by mouth every 8 (eight) hours as needed for nausea or vomiting. 30 tablet 1   pantoprazole (PROTONIX) 40 MG tablet Take 1 tablet (40 mg total) by mouth daily. 30 tablet 1   phenazopyridine (PYRIDIUM) 100 MG tablet Take 1 tablet (100 mg total) by mouth 3 (three) times daily as needed for pain. 10 tablet 0   SUBOXONE 8-2 MG FILM Place under the tongue 2 (two) times daily.     No current facility-administered medications for this visit.     Musculoskeletal: Strength & Muscle Tone: within normal limits Gait & Station: normal Patient leans: N/A  Psychiatric Specialty Exam: Review of Systems  All other systems reviewed and are negative.   There were no vitals taken for this visit.There is no height or weight on file to calculate BMI.  General Appearance: Casual and Fairly Groomed  Eye Contact:  Good  Speech:  Clear and Coherent  Volume:  Normal  Mood:  Euthymic   Affect:  Congruent  Thought Process:  Goal Directed  Orientation:  Full (Time, Place, and Person)  Thought Content: WDL   Suicidal Thoughts:  No  Homicidal Thoughts:  No  Memory:  Immediate;   Good Recent;   Good Remote;   Fair  Judgement:  Good  Insight:  Fair  Psychomotor Activity:  Normal  Concentration:  Concentration: Good and Attention Span: Good  Recall:  Good  Fund of Knowledge: Good  Language: Good  Akathisia:  No  Handed:  Right  AIMS (if indicated): not done  Assets:  Communication Skills Desire for Improvement Physical Health Resilience Social Support Talents/Skills  ADL's:  Intact  Cognition: WNL  Sleep:  Good   Screenings: AIMS    Flowsheet Row [facility]  AIMS Total Score 0      AUDIT    Flowsheet Row [facility]  Alcohol Use Disorder Identification Test Final Score (AUDIT) 2      GAD-7    Flowsheet Row Office Visit from 02/22/2023 in [facility] Office Visit from 11/05/2022 in [facility] Counselor from 04/21/2022 in [facility] at [place] Counselor from 01/05/2022 in [facility] at [place] Counselor from 05/11/2021 in [facility] at [place]  Total GAD-7 Score 15 11 3 9 10       PHQ2-9    Flowsheet Row Office Visit from 02/22/2023 in [facility] Office Visit from 11/05/2022 in [facility] Primary Care Office Visit from 08/05/2022 in [facility] Primary Care Video Visit from 06/21/2022 in [facility] at [place] Office Visit from 05/05/2022 in [facility] Primary Care  PHQ-2 Total Score 3 3 2  0 0  PHQ-9 Total  Score 12 15 7  -- --      Flowsheet Row ED from 01/19/2023 in [facility] at [facility] ED to Hosp-Admission (Discharged) from 08/27/2022 in [facility] SURGICAL UNIT ED from 07/20/2022 in [facility] at [facility] RISK CATEGORY No Risk No Risk No Risk        Assessment and Plan: This patient is a 32 year old female with a history of depression and anxiety.  She continues to do well on her current regimen.  She will continue Trintellix 10 mg daily for depression and Xanax 1 mg daily as needed for anxiety.  She will continue to work to get off Suboxone.  She will return to see me in 3 months.  Collaboration of Care: Collaboration of Care: Referral or follow-up with counselor/therapist AEB patient will continue therapy with [name] in our office  Patient/Guardian was advised Release of Information must be obtained prior to any record release in order to collaborate their care with an outside provider. Patient/Guardian was advised if they have not already done so to contact the registration department to sign all necessary forms in order for [place] to release information regarding their care.   Consent: Patient/Guardian gives verbal consent for treatment and assignment of benefits for services provided during this visit. Patient/Guardian expressed understanding and agreed to proceed.    [name], MD 03/17/2023, 1:47 PM

## 2023-04-12 DIAGNOSIS — F4312 Post-traumatic stress disorder, chronic: Secondary | ICD-10-CM | POA: Diagnosis not present

## 2023-04-12 DIAGNOSIS — F411 Generalized anxiety disorder: Secondary | ICD-10-CM | POA: Diagnosis not present

## 2023-04-21 ENCOUNTER — Other Ambulatory Visit: Payer: Self-pay

## 2023-04-21 ENCOUNTER — Emergency Department (HOSPITAL_COMMUNITY)
Admission: EM | Admit: 2023-04-21 | Discharge: 2023-04-21 | Disposition: A | Discharge status: Home / Self Care | Payer: Medicaid Other | Attending: Emergency Medicine | Admitting: Emergency Medicine

## 2023-04-21 ENCOUNTER — Encounter (HOSPITAL_COMMUNITY): Payer: Self-pay | Admitting: Emergency Medicine

## 2023-04-21 ENCOUNTER — Emergency Department (HOSPITAL_COMMUNITY): Payer: Medicaid Other

## 2023-04-21 DIAGNOSIS — Z91040 Latex allergy status: Secondary | ICD-10-CM | POA: Insufficient documentation

## 2023-04-21 DIAGNOSIS — R0602 Shortness of breath: Secondary | ICD-10-CM | POA: Diagnosis not present

## 2023-04-21 DIAGNOSIS — Z1152 Encounter for screening for COVID-19: Secondary | ICD-10-CM | POA: Insufficient documentation

## 2023-04-21 DIAGNOSIS — E876 Hypokalemia: Secondary | ICD-10-CM | POA: Insufficient documentation

## 2023-04-21 DIAGNOSIS — F1120 Opioid dependence, uncomplicated: Secondary | ICD-10-CM | POA: Insufficient documentation

## 2023-04-21 DIAGNOSIS — I10 Essential (primary) hypertension: Secondary | ICD-10-CM | POA: Diagnosis not present

## 2023-04-21 DIAGNOSIS — R5383 Other fatigue: Secondary | ICD-10-CM | POA: Diagnosis not present

## 2023-04-21 LAB — CBC WITH DIFFERENTIAL/PLATELET
Abs Immature Granulocytes: 0.02 10*3/uL (ref 0.00–0.07)
Basophils Absolute: 0.1 10*3/uL (ref 0.0–0.1)
Basophils Relative: 1 %
Eosinophils Absolute: 0.2 10*3/uL (ref 0.0–0.5)
Eosinophils Relative: 2 %
HCT: 42.4 % (ref 36.0–46.0)
Hemoglobin: 14.4 g/dL (ref 12.0–15.0)
Immature Granulocytes: 0 %
Lymphocytes Relative: 34 %
Lymphs Abs: 3 10*3/uL (ref 0.7–4.0)
MCH: 29.8 pg (ref 26.0–34.0)
MCHC: 34 g/dL (ref 30.0–36.0)
MCV: 87.6 fL (ref 80.0–100.0)
Monocytes Absolute: 0.7 10*3/uL (ref 0.1–1.0)
Monocytes Relative: 8 %
Neutro Abs: 4.7 10*3/uL (ref 1.7–7.7)
Neutrophils Relative %: 55 %
Platelets: 380 10*3/uL (ref 150–400)
RBC: 4.84 MIL/uL (ref 3.87–5.11)
RDW: 12.6 % (ref 11.5–15.5)
WBC: 8.7 10*3/uL (ref 4.0–10.5)
nRBC: 0 % (ref 0.0–0.2)

## 2023-04-21 LAB — COMPREHENSIVE METABOLIC PANEL
ALT: 25 U/L (ref 0–44)
AST: 25 U/L (ref 15–41)
Albumin: 4.2 g/dL (ref 3.5–5.0)
Alkaline Phosphatase: 77 U/L (ref 38–126)
Anion gap: 8 (ref 5–15)
BUN: 9 mg/dL (ref 6–20)
CO2: 24 mmol/L (ref 22–32)
Calcium: 8.7 mg/dL — ABNORMAL LOW (ref 8.9–10.3)
Chloride: 101 mmol/L (ref 98–111)
Creatinine, Ser: 0.67 mg/dL (ref 0.44–1.00)
GFR, Estimated: >60 mL/min (ref >60)
Glucose, Bld: 98 mg/dL (ref 70–99)
Potassium: 3.3 mmol/L — ABNORMAL LOW (ref 3.5–5.1)
Sodium: 133 mmol/L — ABNORMAL LOW (ref 135–145)
Total Bilirubin: 0.5 mg/dL (ref 0.3–1.2)
Total Protein: 7.6 g/dL (ref 6.5–8.1)

## 2023-04-21 LAB — URINALYSIS, ROUTINE W REFLEX MICROSCOPIC
Bilirubin Urine: NEGATIVE
Glucose, UA: NEGATIVE mg/dL
Ketones, ur: NEGATIVE mg/dL
Leukocytes,Ua: NEGATIVE
Nitrite: NEGATIVE
Protein, ur: NEGATIVE mg/dL
Specific Gravity, Urine: 1.024 (ref 1.005–1.030)
pH: 5 (ref 5.0–8.0)

## 2023-04-21 LAB — SARS CORONAVIRUS 2 BY RT PCR: SARS Coronavirus 2 by RT PCR: NEGATIVE

## 2023-04-21 LAB — LIPASE, BLOOD: Lipase: 22 U/L (ref 11–51)

## 2023-04-21 LAB — PREGNANCY, URINE: Preg Test, Ur: NEGATIVE

## 2023-04-21 MED ORDER — SODIUM CHLORIDE 0.9 % IV BOLUS
1000.0000 mL | Freq: Once | INTRAVENOUS | Status: AC
  Administered 2023-04-21: 1000 mL via INTRAVENOUS

## 2023-04-21 MED ORDER — POTASSIUM CHLORIDE CRYS ER 20 MEQ PO TBCR
20.0000 meq | EXTENDED_RELEASE_TABLET | Freq: Once | ORAL | Status: AC
  Administered 2023-04-21: 20 meq via ORAL
  Filled 2023-04-21: qty 1

## 2023-04-21 MED ORDER — ONDANSETRON HCL 4 MG/2ML IJ SOLN
4.0000 mg | Freq: Once | INTRAMUSCULAR | Status: AC
  Administered 2023-04-21: 4 mg via INTRAVENOUS
  Filled 2023-04-21: qty 2

## 2023-04-21 NOTE — Discharge Instructions (Addendum)
Please follow-up with your primary care provider and psychiatrist regarding recent symptoms and ER visit.  Today your labs are all reassuring however your potassium is low which was replenished.  Is very important they monitor your symptoms and your blood pressure at home while you wait to see your primary care provider and psychiatrist.  If symptoms begin to change or worsen please return to ER.

## 2023-04-21 NOTE — ED Notes (Signed)
Pt attempted to swallow potassium pill, was unable complete medication administration due to pt becoming frustrated and throwing pill across the room. PA made aware.   Pt tearful, but in NAD at this time. Discharge instructions and follow up care reviewed.

## 2023-04-21 NOTE — ED Provider Notes (Signed)
[place] EMERGENCY DEPARTMENT AT [facility] Provider Note   CSN: [id number] Arrival date & time: 04/21/23  1741     History  Chief Complaint  Patient presents with   Fatigue   Hypertension    Karen Shields is a 32 y.o. female history of opioid use disorder on Suboxone, GERD, DDD presented with fatigue, diaphoresis, hypertension over the past few days.  Patient states that she normally uses her Suboxone to 3 times a week and is currently trying to wean herself off of it.  Patient states she goes to the methadone clinic but that she is doing this weaning process on her own.  Patient states that last night was the last time she used the Suboxone.  Patient notes at home that she has had higher blood pressures than normal but is on sure of the top number but states that the bottom number has been 90.  Patient denies that she has been sweating more than normal as well.    Home Medications Prior to Admission medications   Medication Sig Start Date End Date Taking? Authorizing Provider  ALPRAZolam Prudy Feeler) 1 MG tablet Take 1 tablet (1 mg total) by mouth daily as needed for anxiety. 03/17/23   Myrlene Broker, MD  budesonide-formoterol [facility]) 80-4.5 MCG/ACT inhaler Inhale 2 puffs into the lungs 2 (two) times daily. 08/09/22   Gilmore Laroche, FNP  buprenorphine-naloxone (SUBOXONE) 8-2 mg SUBL SL tablet Place 1 tablet under the tongue 2 (two) times daily. 01/19/22   [provider]  cetirizine (ZYRTEC) 10 MG tablet Take 10 mg by mouth daily as needed.    [provider]  Cholecalciferol (VITAMIN D3) 5000 units CAPS Take by mouth.    [provider]  cyclobenzaprine (FLEXERIL) 10 MG tablet Take 1 tablet (10 mg total) by mouth 3 (three) times daily as needed for muscle spasms. 12/16/22   Del Nigel Berthold, FNP  dicyclomine (BENTYL) 20 MG tablet Take 1 tablet (20 mg total) by mouth in the morning, at noon, in the evening, and at bedtime. 11/05/22    Gilmore Laroche, FNP  loperamide (IMODIUM A-D) 2 MG tablet Take 1 tablet (2 mg total) by mouth 4 (four) times daily as needed for diarrhea or loose stools. 11/05/22   Gilmore Laroche, FNP  montelukast (SINGULAIR) 10 MG tablet Take 1 tablet (10 mg total) by mouth daily. 08/29/22 08/29/23  Vassie Loll, MD  nitrofurantoin, macrocrystal-monohydrate, (MACROBID) 100 MG capsule Take 1 capsule (100 mg total) by mouth 2 (two) times daily. 01/19/23   Leath-Warren, Sadie Haber, NP  nystatin cream (MYCOSTATIN) Apply topically 2 (two) times daily. 08/09/22   [provider]  nystatin-triamcinolone ointment (MYCOLOG) Apply 1 Application topically 2 (two) times daily. 08/09/22   Gilmore Laroche, FNP  ondansetron (ZOFRAN) 4 MG tablet Take 1 tablet (4 mg total) by mouth every 8 (eight) hours as needed for nausea or vomiting. 11/05/22   Gilmore Laroche, FNP  pantoprazole (PROTONIX) 40 MG tablet Take 1 tablet (40 mg total) by mouth daily. 11/05/22 01/04/23  Gilmore Laroche, FNP  phenazopyridine (PYRIDIUM) 100 MG tablet Take 1 tablet (100 mg total) by mouth 3 (three) times daily as needed for pain. 01/19/23   Leath-Warren, Sadie Haber, NP  SUBOXONE 8-2 MG FILM Place under the tongue 2 (two) times daily. 10/12/22   [provider]  VENTOLIN HFA 108 (90 Base) MCG/ACT inhaler INHALE TWO PUFFS INTO LUNGS FOUR TIMES DAILY 03/17/23   Gilmore Laroche, FNP  vortioxetine HBr (TRINTELLIX)  10 MG TABS tablet Take 1 tablet (10 mg total) by mouth daily. 03/17/23   Myrlene Broker, MD      Allergies    Aloe and Latex    Review of Systems   Review of Systems See HPI Physical Exam Updated Vital Signs BP 113/68   Pulse 63   Temp 98.5 F (36.9 C) (Oral)   Resp (!) 4   Ht 5\' 4"  (1.626 m)   Wt 82.1 kg   SpO2 96%   BMI 31.07 kg/m  Physical Exam Vitals reviewed.  Constitutional:      General: She is not in acute distress. HENT:     Head: Normocephalic and atraumatic.  Eyes:     Extraocular Movements: Extraocular  movements intact.     Conjunctiva/sclera: Conjunctivae normal.     Pupils: Pupils are equal, round, and reactive to light.  Cardiovascular:     Rate and Rhythm: Normal rate and regular rhythm.     Pulses: Normal pulses.     Heart sounds: Normal heart sounds.     Comments: 2+ bilateral radial/dorsalis pedis pulses with regular rate Pulmonary:     Effort: Pulmonary effort is normal. No respiratory distress.     Breath sounds: Normal breath sounds.  Abdominal:     Palpations: Abdomen is soft.     Tenderness: There is no abdominal tenderness. There is no guarding or rebound.  Musculoskeletal:        General: Normal range of motion.     Cervical back: Normal range of motion and neck supple.     Comments: 5 out of 5 bilateral grip/leg extension strength  Skin:    General: Skin is warm and dry.     Capillary Refill: Capillary refill takes less than 2 seconds.  Neurological:     General: No focal deficit present.     Mental Status: She is alert and oriented to person, place, and time.     Comments: Sensation intact in all 4 limbs  Psychiatric:        Mood and Affect: Mood normal.     ED Results / Procedures / Treatments   Labs (all labs ordered are listed, but only abnormal results are displayed) Labs Reviewed  COMPREHENSIVE METABOLIC PANEL - Abnormal; Notable for the following components:      Result Value   Sodium 133 (*)    Potassium 3.3 (*)    Calcium 8.7 (*)    All other components within normal limits  URINALYSIS, ROUTINE W REFLEX MICROSCOPIC - Abnormal; Notable for the following components:   APPearance CLOUDY (*)    Hgb urine dipstick LARGE (*)    Bacteria, UA RARE (*)    All other components within normal limits  SARS CORONAVIRUS 2 BY RT PCR  CBC WITH DIFFERENTIAL/PLATELET  LIPASE, BLOOD  PREGNANCY, URINE    EKG None  Radiology DG Chest Port 1 View  Result Date: 04/21/2023 CLINICAL DATA:  Shortness of breath EXAM: PORTABLE CHEST 1 VIEW COMPARISON:  Chest x-ray  08/27/2022 FINDINGS: The heart size and mediastinal contours are within normal limits. Both lungs are clear. The visualized skeletal structures are unremarkable. IMPRESSION: No active disease. Electronically Signed   By: Darliss Cheney M.D.   On: 04/21/2023 19:55    Procedures Procedures    Medications Ordered in ED Medications  potassium chloride SA (KLOR-CON M) CR tablet 20 mEq (has no administration in time range)  ondansetron (ZOFRAN) injection 4 mg (4 mg Intravenous Given 04/21/23 2016)  sodium chloride 0.9 % bolus 1,000 mL (0 mLs Intravenous Stopped 04/21/23 2143)    ED Course/ Medical Decision Making/ A&P                                 Medical Decision Making Amount and/or Complexity of Data Reviewed Labs: ordered. Radiology: ordered.  Risk Prescription drug management.   [name] 32 y.o. presented today for fatigue, hypertension. Working DDx that I considered at this time includes, but not limited to, viral illness, medication withdrawal, sympathomimetic use, AKI, electrolyte abnormalities, dehydration.  R/o DDx: viral illness, medication withdrawal, sympathomimetic use, AKI, electrolyte abnormalities, dehydration: These are considered less likely due to history of present illness and physical exam findings  Review of prior external notes: 01/19/2023 ED  Unique Tests and My Interpretation:  COVID: Negative Lipase: Negative Urine pregnancy: Negative UA: Unremarkable CMP: Hypokalemia 3.3 CBC: Unremarkable Chest x-ray: Unremarkable EKG: Sinus 79 bpm, no ST elevations or depressions noted, no blocks noted  Discussion with Independent Historian: None  Discussion of Management of Tests:  Significant other  Risk: Medium: prescription drug management  Risk Stratification Score: None  Plan: On exam patient was in no acute distress stable vitals.  Patient unremarkable physical exam and so labs will be ordered.  At this time it is hard to determine the cause of  patient's fatigue and hypertension.  Labs came back reassuring however patient potassium was slightly low at 3.3 and will be replenished.  The rest patient's labs were ultimately reassuring and her vitals here today have been reassuring as well and nonhypertensive.  Patient has not been diaphoretic at any point during the ED stay and has not endorsed any symptoms over the past 4 and half hours.  I spoke to the patient we had shared decision making in which patient can be discharged to follow-up with her psychiatrist and primary care provider.  At this time patient stated that she has had the symptoms for the past 5 years and has history of anxiety/panic attacks which could be contributing to her symptoms.  I encouraged patient to monitor symptoms and to return to ER if symptoms are change or worsen.  Patient was given return precautions. Patient stable for discharge at this time.  Patient verbalized understanding of plan.         Final Clinical Impression(s) / ED Diagnoses Final diagnoses:  Other fatigue  Hypokalemia    Rx / DC Orders ED Discharge Orders     None         Remi Deter 04/21/23 2215    Rondel Baton, MD 04/23/23 [id number]

## 2023-04-21 NOTE — ED Triage Notes (Addendum)
Pt reports fatigue, excessive sweating even in air conditioning since yesterday, pt reports hypertension but has been unable to get an appt with PCP

## 2023-04-22 ENCOUNTER — Emergency Department (HOSPITAL_COMMUNITY)
Admission: EM | Admit: 2023-04-22 | Discharge: 2023-04-22 | Disposition: A | Discharge status: Home / Self Care | Payer: Medicaid Other | Attending: Emergency Medicine | Admitting: Emergency Medicine

## 2023-04-22 ENCOUNTER — Other Ambulatory Visit: Payer: Self-pay

## 2023-04-22 ENCOUNTER — Encounter (HOSPITAL_COMMUNITY): Payer: Self-pay

## 2023-04-22 ENCOUNTER — Emergency Department (HOSPITAL_COMMUNITY): Payer: Medicaid Other

## 2023-04-22 DIAGNOSIS — R5383 Other fatigue: Secondary | ICD-10-CM | POA: Diagnosis present

## 2023-04-22 DIAGNOSIS — Z1152 Encounter for screening for COVID-19: Secondary | ICD-10-CM | POA: Insufficient documentation

## 2023-04-22 DIAGNOSIS — Z7951 Long term (current) use of inhaled steroids: Secondary | ICD-10-CM | POA: Insufficient documentation

## 2023-04-22 DIAGNOSIS — R519 Headache, unspecified: Secondary | ICD-10-CM | POA: Insufficient documentation

## 2023-04-22 DIAGNOSIS — Z91040 Latex allergy status: Secondary | ICD-10-CM | POA: Insufficient documentation

## 2023-04-22 DIAGNOSIS — J45909 Unspecified asthma, uncomplicated: Secondary | ICD-10-CM | POA: Insufficient documentation

## 2023-04-22 DIAGNOSIS — R5381 Other malaise: Secondary | ICD-10-CM | POA: Insufficient documentation

## 2023-04-22 DIAGNOSIS — F17210 Nicotine dependence, cigarettes, uncomplicated: Secondary | ICD-10-CM | POA: Insufficient documentation

## 2023-04-22 DIAGNOSIS — R079 Chest pain, unspecified: Secondary | ICD-10-CM | POA: Insufficient documentation

## 2023-04-22 DIAGNOSIS — R0789 Other chest pain: Secondary | ICD-10-CM | POA: Diagnosis not present

## 2023-04-22 LAB — CBC WITH DIFFERENTIAL/PLATELET
Abs Immature Granulocytes: 0.04 10*3/uL (ref 0.00–0.07)
Basophils Absolute: 0.1 10*3/uL (ref 0.0–0.1)
Basophils Relative: 1 %
Eosinophils Absolute: 0.1 10*3/uL (ref 0.0–0.5)
Eosinophils Relative: 1 %
HCT: 43.1 % (ref 36.0–46.0)
Hemoglobin: 14.8 g/dL (ref 12.0–15.0)
Immature Granulocytes: 0 %
Lymphocytes Relative: 20 %
Lymphs Abs: 2.2 10*3/uL (ref 0.7–4.0)
MCH: 30 pg (ref 26.0–34.0)
MCHC: 34.3 g/dL (ref 30.0–36.0)
MCV: 87.4 fL (ref 80.0–100.0)
Monocytes Absolute: 0.5 10*3/uL (ref 0.1–1.0)
Monocytes Relative: 5 %
Neutro Abs: 8.1 10*3/uL — ABNORMAL HIGH (ref 1.7–7.7)
Neutrophils Relative %: 73 %
Platelets: 405 10*3/uL — ABNORMAL HIGH (ref 150–400)
RBC: 4.93 MIL/uL (ref 3.87–5.11)
RDW: 12.6 % (ref 11.5–15.5)
WBC: 11 10*3/uL — ABNORMAL HIGH (ref 4.0–10.5)
nRBC: 0 % (ref 0.0–0.2)

## 2023-04-22 LAB — URINALYSIS, ROUTINE W REFLEX MICROSCOPIC
Bacteria, UA: NONE SEEN
Bilirubin Urine: NEGATIVE
Glucose, UA: NEGATIVE mg/dL
Ketones, ur: 5 mg/dL — AB
Leukocytes,Ua: NEGATIVE
Nitrite: NEGATIVE
Protein, ur: NEGATIVE mg/dL
Specific Gravity, Urine: 1.021 (ref 1.005–1.030)
pH: 5 (ref 5.0–8.0)

## 2023-04-22 LAB — COMPREHENSIVE METABOLIC PANEL
ALT: 24 U/L (ref 0–44)
AST: 26 U/L (ref 15–41)
Albumin: 4.3 g/dL (ref 3.5–5.0)
Alkaline Phosphatase: 79 U/L (ref 38–126)
Anion gap: 7 (ref 5–15)
BUN: 9 mg/dL (ref 6–20)
CO2: 22 mmol/L (ref 22–32)
Calcium: 9 mg/dL (ref 8.9–10.3)
Chloride: 106 mmol/L (ref 98–111)
Creatinine, Ser: 0.67 mg/dL (ref 0.44–1.00)
GFR, Estimated: >60 mL/min (ref >60)
Glucose, Bld: 119 mg/dL — ABNORMAL HIGH (ref 70–99)
Potassium: 3.6 mmol/L (ref 3.5–5.1)
Sodium: 135 mmol/L (ref 135–145)
Total Bilirubin: 0.9 mg/dL (ref 0.3–1.2)
Total Protein: 7.5 g/dL (ref 6.5–8.1)

## 2023-04-22 LAB — RAPID URINE DRUG SCREEN, HOSP PERFORMED
Amphetamines: NOT DETECTED
Barbiturates: NOT DETECTED
Benzodiazepines: POSITIVE — AB
Cocaine: NOT DETECTED
Opiates: NOT DETECTED
Tetrahydrocannabinol: POSITIVE — AB

## 2023-04-22 LAB — TROPONIN I (HIGH SENSITIVITY)
Troponin I (High Sensitivity): 2 ng/L (ref <18)
Troponin I (High Sensitivity): <2 ng/L (ref <18)

## 2023-04-22 LAB — RESP PANEL BY RT-PCR (RSV, FLU A&B, COVID)  RVPGX2
Influenza A by PCR: NEGATIVE
Influenza B by PCR: NEGATIVE
Resp Syncytial Virus by PCR: NEGATIVE
SARS Coronavirus 2 by RT PCR: NEGATIVE

## 2023-04-22 LAB — LIPASE, BLOOD: Lipase: 25 U/L (ref 11–51)

## 2023-04-22 LAB — CBG MONITORING, ED: Glucose-Capillary: 132 mg/dL — ABNORMAL HIGH (ref 70–99)

## 2023-04-22 LAB — TSH: TSH: 0.658 u[IU]/mL (ref 0.350–4.500)

## 2023-04-22 LAB — MAGNESIUM: Magnesium: 1.9 mg/dL (ref 1.7–2.4)

## 2023-04-22 LAB — PREGNANCY, URINE: Preg Test, Ur: NEGATIVE

## 2023-04-22 LAB — T4, FREE: Free T4: 1.03 ng/dL (ref 0.61–1.12)

## 2023-04-22 MED ORDER — DIAZEPAM 5 MG PO TABS: 5.0000 mg | ORAL_TABLET | Freq: Two times a day (BID) | ORAL | 0 refills | Status: DC | PRN

## 2023-04-22 MED ORDER — DIAZEPAM 5 MG PO TABS
5.0000 mg | ORAL_TABLET | Freq: Once | ORAL | Status: AC
  Administered 2023-04-22: 5 mg via ORAL
  Filled 2023-04-22: qty 1

## 2023-04-22 MED ORDER — KETOROLAC TROMETHAMINE 15 MG/ML IJ SOLN
15.0000 mg | Freq: Once | INTRAMUSCULAR | Status: AC
  Administered 2023-04-22: 15 mg via INTRAVENOUS
  Filled 2023-04-22: qty 1

## 2023-04-22 MED ORDER — SODIUM CHLORIDE 0.9 % IV BOLUS
1000.0000 mL | Freq: Once | INTRAVENOUS | Status: AC
  Administered 2023-04-22: 1000 mL via INTRAVENOUS

## 2023-04-22 NOTE — ED Provider Notes (Signed)
[place] EMERGENCY DEPARTMENT AT [facility] Provider Note  CSN: [id number] Arrival date & time: 04/22/23 1156  Chief Complaint(s) Headache  HPI Karen Shields is a 32 y.o. female with past medical history as below, significant for anxiety, depression, GERD, IBS, asthma who presents to the ED with complaint of fatigue, ha, body aches, nausea, constipation, poor appetite.   She was seen yesterday with complaint of fatigue, diaphoresis intermittently, elevated blood pressure. Workup at that time was re-assuring and she was discharged in stable condition. Pt here today with concerns over her blood sugar, headache, arm cramping.  She reports no sig improvement after her discharge last night. Her symptoms have been ongoing around 1-2 months but seem to be somewhat worsening over last few weeks. She is compliant with her home medications. Has not seen PCP for this issue. No sig abd pain but has had intermittent headaches resolved with excedrin migraine. No fevers or chills but does report intermittent sweating. Elevated blood pressure at home. Intermittent chest pain, onset >24 hours ago. Increased anxiety. Stiffness to her left arm with occ paresthesias, onset this morning but have since resolved. Some discomfort noted to her right arm as well but primarily on her left.    Past Medical History Past Medical History:  Diagnosis Date   Anxiety    Asthma    WELL CONTROLLED   Asthma    Depression    pt stated   Eczema 2008   Gallstones    GERD (gastroesophageal reflux disease)    Headache    Hx MRSA infection 09-2014   IBS (irritable bowel syndrome)    Ovarian cyst    Patient Active Problem List   Diagnosis Date Noted   Acute cystitis with hematuria 02/22/2023   Muscle spasm 12/16/2022   IBS (irritable bowel syndrome) 11/05/2022   Cough 08/29/2022   Acute respiratory failure with hypoxia (HCC) 08/29/2022   Asthma exacerbation 08/27/2022   Asthma 08/05/2022   Nipple discharge  in female 08/05/2022   Cervical cancer screening 05/05/2022   Cystitis 03/17/2022   Tobacco use 03/09/2022   MDD (major depressive disorder), recurrent episode, severe (HCC) 12/22/2017   Gallstone    RUQ pain 12/25/2015   Gallstones    Reflux esophagitis    Nausea without vomiting 11/04/2015   Cholelithiases 11/04/2015   GERD (gastroesophageal reflux disease) 11/04/2015   Early satiety 11/04/2015   MRSA (methicillin resistant staph aureus) culture positive 12/11/2014   Menstrual migraine without status migrainosus, not intractable 07/10/2014   Anxiety 06/14/2013   Maternal substance abuse (HCC) 04/14/2013   H/O cold sores 04/12/2013   Ovarian cyst, left 02/09/2011   Home Medication(s) Prior to Admission medications   Medication Sig Start Date End Date Taking? Authorizing Provider  diazepam (VALIUM) 5 MG tablet Take 1 tablet (5 mg total) by mouth every 12 (twelve) hours as needed for anxiety. 04/22/23  Yes Sloan Leiter, DO  ALPRAZolam Prudy Feeler) 1 MG tablet Take 1 tablet (1 mg total) by mouth daily as needed for anxiety. 03/17/23   Myrlene Broker, MD  budesonide-formoterol [facility]) 80-4.5 MCG/ACT inhaler Inhale 2 puffs into the lungs 2 (two) times daily. 08/09/22   Gilmore Laroche, FNP  buprenorphine-naloxone (SUBOXONE) 8-2 mg SUBL SL tablet Place 1 tablet under the tongue 2 (two) times daily. 01/19/22   [provider]  cetirizine (ZYRTEC) 10 MG tablet Take 10 mg by mouth daily as needed.    [provider]  Cholecalciferol (VITAMIN D3) 5000 units CAPS Take  by mouth.    [provider]  cyclobenzaprine (FLEXERIL) 10 MG tablet Take 1 tablet (10 mg total) by mouth 3 (three) times daily as needed for muscle spasms. 12/16/22   Del Nigel Berthold, FNP  dicyclomine (BENTYL) 20 MG tablet Take 1 tablet (20 mg total) by mouth in the morning, at noon, in the evening, and at bedtime. 11/05/22   Gilmore Laroche, FNP  loperamide (IMODIUM A-D) 2 MG tablet Take 1 tablet  (2 mg total) by mouth 4 (four) times daily as needed for diarrhea or loose stools. 11/05/22   Gilmore Laroche, FNP  montelukast (SINGULAIR) 10 MG tablet Take 1 tablet (10 mg total) by mouth daily. 08/29/22 08/29/23  Vassie Loll, MD  nitrofurantoin, macrocrystal-monohydrate, (MACROBID) 100 MG capsule Take 1 capsule (100 mg total) by mouth 2 (two) times daily. 01/19/23   Leath-Warren, Sadie Haber, NP  nystatin cream (MYCOSTATIN) Apply topically 2 (two) times daily. 08/09/22   [provider]  nystatin-triamcinolone ointment (MYCOLOG) Apply 1 Application topically 2 (two) times daily. 08/09/22   Gilmore Laroche, FNP  ondansetron (ZOFRAN) 4 MG tablet Take 1 tablet (4 mg total) by mouth every 8 (eight) hours as needed for nausea or vomiting. 11/05/22   Gilmore Laroche, FNP  pantoprazole (PROTONIX) 40 MG tablet Take 1 tablet (40 mg total) by mouth daily. 11/05/22 01/04/23  Gilmore Laroche, FNP  phenazopyridine (PYRIDIUM) 100 MG tablet Take 1 tablet (100 mg total) by mouth 3 (three) times daily as needed for pain. 01/19/23   Leath-Warren, Sadie Haber, NP  SUBOXONE 8-2 MG FILM Place under the tongue 2 (two) times daily. 10/12/22   [provider]  VENTOLIN HFA 108 (90 Base) MCG/ACT inhaler INHALE TWO PUFFS INTO LUNGS FOUR TIMES DAILY 03/17/23   Gilmore Laroche, FNP  vortioxetine HBr (TRINTELLIX) 10 MG TABS tablet Take 1 tablet (10 mg total) by mouth daily. 03/17/23   Myrlene Broker, MD                                                                                                                                    Past Surgical History Past Surgical History:  Procedure Laterality Date   CHOLECYSTECTOMY N/A 01/16/2016   Procedure: LAPAROSCOPIC CHOLECYSTECTOMY;  Surgeon: Lattie Haw, MD;  Location: [facility] ORS;  Service: General;  Laterality: N/A;   ESOPHAGOGASTRODUODENOSCOPY N/A 11/13/2015   UYQ:IHKVQQV reflux    OVARIAN CYST REMOVAL     OVARIAN CYST REMOVAL  2011   TUBAL LIGATION     WISDOM  TOOTH EXTRACTION     Family History Family History  Problem Relation Age of Onset   Migraines Mother    Diabetes Mother    Anxiety disorder Mother    Depression Mother    Depression Maternal Grandmother    Stroke Maternal Grandfather    Dementia Paternal Grandfather    Colon cancer Neg Hx    Crohn's disease Neg Hx  Celiac disease Neg Hx     Social History Social History   Tobacco Use   Smoking status: Every Day    Current packs/day: 0.75    Average packs/day: 0.8 packs/day for 17.2 years (12.9 ttl pk-yrs)    Types: Cigarettes    Start date: 02/11/2006   Smokeless tobacco: Never  Vaping Use   Vaping status: Former  Substance Use Topics   Alcohol use: Yes    Alcohol/week: 0.0 standard drinks of alcohol    Comment: rarely, maybe once a year   Drug use: Not Currently    Types: Benzodiazepines, Opium   Allergies Aloe and Latex  Review of Systems Review of Systems  Constitutional:  Positive for diaphoresis. Negative for chills and fever.  HENT:  Negative for congestion and trouble swallowing.   Respiratory:  Positive for chest tightness. Negative for cough.   Cardiovascular:  Positive for chest pain. Negative for palpitations and leg swelling.  Gastrointestinal:  Positive for constipation and nausea. Negative for abdominal pain and vomiting.  Endocrine: Negative for polyuria.  Genitourinary:  Negative for dysuria and urgency.  Musculoskeletal:  Positive for arthralgias. Negative for joint swelling.  Skin:  Negative for rash and wound.  Neurological:  Positive for headaches.  All other systems reviewed and are negative.   Physical Exam Vital Signs  I have reviewed the triage vital signs BP 106/69   Pulse 81   Temp 98.3 F (36.8 C)   Resp 19   Ht 5\' 3"  (1.6 m)   Wt 83.9 kg   SpO2 99%   BMI 32.77 kg/m  Physical Exam Vitals and nursing note reviewed.  Constitutional:      General: She is not in acute distress.    Appearance: Normal appearance.  HENT:      Head: Normocephalic and atraumatic.     Right Ear: External ear normal.     Left Ear: External ear normal.     Nose: Nose normal.     Mouth/Throat:     Mouth: Mucous membranes are moist.  Eyes:     General: No scleral icterus.       Right eye: No discharge.        Left eye: No discharge.     Extraocular Movements: Extraocular movements intact.     Pupils: Pupils are equal, round, and reactive to light.  Cardiovascular:     Rate and Rhythm: Normal rate and regular rhythm.     Pulses: Normal pulses.     Heart sounds: Normal heart sounds.  Pulmonary:     Effort: Pulmonary effort is normal. No respiratory distress.     Breath sounds: Normal breath sounds. No stridor.  Abdominal:     General: Abdomen is flat. There is no distension.     Palpations: Abdomen is soft.     Tenderness: There is no abdominal tenderness.  Musculoskeletal:     Cervical back: No rigidity.     Right lower leg: No edema.     Left lower leg: No edema.  Skin:    General: Skin is warm and dry.     Capillary Refill: Capillary refill takes less than 2 seconds.  Neurological:     Mental Status: She is alert and oriented to person, place, and time. Mental status is at baseline.     GCS: GCS eye subscore is 4. GCS verbal subscore is 5. GCS motor subscore is 6.     Cranial Nerves: Cranial nerves 2-12 are intact. No dysarthria or  facial asymmetry.     Motor: No weakness or tremor.     Coordination: Coordination is intact.     Gait: Gait is intact.  Psychiatric:        Mood and Affect: Mood is anxious.        Behavior: Behavior normal. Behavior is cooperative.     ED Results and Treatments Labs (all labs ordered are listed, but only abnormal results are displayed) Labs Reviewed  CBC WITH DIFFERENTIAL/PLATELET - Abnormal; Notable for the following components:      Result Value   WBC 11.0 (*)    Platelets 405 (*)    Neutro Abs 8.1 (*)    All other components within normal limits  COMPREHENSIVE METABOLIC  PANEL - Abnormal; Notable for the following components:   Glucose, Bld 119 (*)    All other components within normal limits  URINALYSIS, ROUTINE W REFLEX MICROSCOPIC - Abnormal; Notable for the following components:   Hgb urine dipstick MODERATE (*)    Ketones, ur 5 (*)    All other components within normal limits  RAPID URINE DRUG SCREEN, HOSP PERFORMED - Abnormal; Notable for the following components:   Benzodiazepines POSITIVE (*)    Tetrahydrocannabinol POSITIVE (*)    All other components within normal limits  CBG MONITORING, ED - Abnormal; Notable for the following components:   Glucose-Capillary 132 (*)    All other components within normal limits  RESP PANEL BY RT-PCR (RSV, FLU A&B, COVID)  RVPGX2  MAGNESIUM  PREGNANCY, URINE  TSH  LIPASE, BLOOD  T4, FREE  CBG MONITORING, ED  TROPONIN I (HIGH SENSITIVITY)  TROPONIN I (HIGH SENSITIVITY)                                                                                                                          Radiology DG Chest Portable 1 View  Result Date: 04/22/2023 CLINICAL DATA:  Chest pain EXAM: PORTABLE CHEST 1 VIEW COMPARISON:  04/21/2023 FINDINGS: Transverse diameter of heart is slightly increased. There are no signs of pulmonary edema or focal pulmonary consolidation. There is poorer inspiration. There is no pleural effusion or pneumothorax. IMPRESSION: There are no signs of pulmonary edema or focal pulmonary consolidation. Electronically Signed   By: Ernie Avena M.D.   On: 04/22/2023 16:47    Pertinent labs & imaging results that were available during my care of the patient were reviewed by me and considered in my medical decision making (see MDM for details).  Medications Ordered in ED Medications  diazepam (VALIUM) tablet 5 mg (5 mg Oral Given 04/22/23 1643)  sodium chloride 0.9 % bolus 1,000 mL (0 mLs Intravenous Stopped 04/22/23 1801)  Procedures Procedures  (including critical care time)  Medical Decision Making / ED Course    Medical Decision Making:    MAKYLAH BOSSARD is a 32 y.o. female with past medical history as below, significant for anxiety, depression, GERD, IBS, asthma who presents to the ED with complaint of fatigue, ha, body aches, nausea, constipation, poor appetite. . The complaint involves an extensive differential diagnosis and also carries with it a high risk of complications and morbidity.  Serious etiology was considered. Ddx includes but is not limited to: Differential includes all life-threatening causes for chest pain. This includes but is not exclusive to acute coronary syndrome, aortic dissection, pulmonary embolism, cardiac tamponade, community-acquired pneumonia, pericarditis, musculoskeletal chest wall pain, etc. Differential diagnosis includes but is not exclusive to subarachnoid hemorrhage, meningitis, encephalitis, previous head trauma, cavernous venous thrombosis, muscle tension headache, glaucoma, temporal arteritis, migraine or migraine equivalent, etc.   Complete initial physical exam performed, notably the patient  was NAD, neuro non-focal, HDS.    Reviewed and confirmed nursing documentation for past medical history, family history, social history.  Vital signs reviewed.    Clinical Course as of 04/22/23 2039  Fri Apr 22, 2023  1728 Symptoms greatly improved  [SG]  2036 Tetrahydrocannabinol(!): POSITIVE [SG]    Clinical Course User Index [SG] Sloan Leiter, DO   Labs reviewed, these are stable UDS with thc > recommend cessation Labs o/w stable She is feeling better Her exam is stable Last headache was yestd, no headache today Neuro exam is non focal  The patient's chest pain is not suggestive of pulmonary embolus, cardiac ischemia, aortic dissection, pericarditis, myocarditis, pulmonary embolism, pneumothorax,  pneumonia, Zoster, or esophageal perforation, or other serious etiology.  Historically not abrupt in onset, tearing or ripping, pulses symmetric. EKG nonspecific for ischemia/infarction. No dysrhythmias, brugada, WPW, prolonged QT noted.   Troponin negative (<2 high sensitivity, pain onset >24 hours ago). CXR reviewed. Labs without demonstration of acute pathology unless otherwise noted above. Low HEART Score: 0-3 points (0.9-1.7% risk of MACE).  Symptoms improved w/ valium, possible psychiatric component considered in her presentation  Does not appear to be in acute opiate withdrawal  Tolerating po  Recommend f/u with pcp on Monday for recheck  Given the extremely low risk of these diagnoses further testing and evaluation for these possibilities does not appear to be indicated at this time. Patient in no distress and overall condition improved here in the ED. Detailed discussions were had with the patient regarding current findings, and need for close f/u with PCP or on call doctor. The patient has been instructed to return immediately if the symptoms worsen in any way for re-evaluation. Patient verbalized understanding and is in agreement with current care plan. All questions answered prior to discharge.           Additional history obtained: -Additional history obtained from mother -External records from outside source obtained and reviewed including: Chart review including previous notes, labs, imaging, consultation notes including  Recent ED visit Prior labs PDMP Recent PCP note 6/27; she is on suboxone, was trying to come off of this herself but had withdrawal symptoms.    Lab Tests: -I ordered, reviewed, and interpreted labs.   The pertinent results include:   Labs Reviewed  CBC WITH DIFFERENTIAL/PLATELET - Abnormal; Notable for the following components:      Result Value   WBC 11.0 (*)    Platelets 405 (*)    Neutro Abs 8.1 (*)    All other  components within normal  limits  COMPREHENSIVE METABOLIC PANEL - Abnormal; Notable for the following components:   Glucose, Bld 119 (*)    All other components within normal limits  URINALYSIS, ROUTINE W REFLEX MICROSCOPIC - Abnormal; Notable for the following components:   Hgb urine dipstick MODERATE (*)    Ketones, ur 5 (*)    All other components within normal limits  RAPID URINE DRUG SCREEN, HOSP PERFORMED - Abnormal; Notable for the following components:   Benzodiazepines POSITIVE (*)    Tetrahydrocannabinol POSITIVE (*)    All other components within normal limits  CBG MONITORING, ED - Abnormal; Notable for the following components:   Glucose-Capillary 132 (*)    All other components within normal limits  RESP PANEL BY RT-PCR (RSV, FLU A&B, COVID)  RVPGX2  MAGNESIUM  PREGNANCY, URINE  TSH  LIPASE, BLOOD  T4, FREE  CBG MONITORING, ED  TROPONIN I (HIGH SENSITIVITY)  TROPONIN I (HIGH SENSITIVITY)    Notable for stable   EKG   EKG Interpretation Date/Time:  Friday April 22 2023 17:26:50 EDT Ventricular Rate:  72 PR Interval:  164 QRS Duration:  86 QT Interval:  367 QTC Calculation: 402 R Axis:   52  Text Interpretation: Sinus rhythm RSR' in V1 or V2, right VCD or RVH similar to prior no stemi Confirmed by Tanda Rockers ([id number]) on 04/22/2023 8:38:54 PM         Imaging Studies ordered: I ordered imaging studies including CXR I independently visualized the following imaging with scope of interpretation limited to determining acute life threatening conditions related to emergency care; findings noted above, significant for stable xr I independently visualized and interpreted imaging. I agree with the radiologist interpretation   Medicines ordered and prescription drug management: Meds ordered this encounter  Medications   diazepam (VALIUM) tablet 5 mg   sodium chloride 0.9 % bolus 1,000 mL   diazepam (VALIUM) 5 MG tablet    Sig: Take 1 tablet (5 mg total) by mouth every 12 (twelve) hours as  needed for anxiety.    Dispense:  6 tablet    Refill:  0    -I have reviewed the patients home medicines and have made adjustments as needed   Consultations Obtained: na   Cardiac Monitoring: The patient was maintained on a cardiac monitor.  I personally viewed and interpreted the cardiac monitored which showed an underlying rhythm of: NSR  Social Determinants of Health:  Diagnosis or treatment significantly limited by social determinants of health: current smoker Counseled patient for approximately 3 minutes regarding smoking cessation. Discussed risks of smoking and how they applied and affected their visit here today.   CPT code: 09811: intermediate counseling for smoking cessation     Reevaluation: After the interventions noted above, I reevaluated the patient and found that they have improved  Co morbidities that complicate the patient evaluation  Past Medical History:  Diagnosis Date   Anxiety    Asthma    WELL CONTROLLED   Asthma    Depression    pt stated   Eczema 2008   Gallstones    GERD (gastroesophageal reflux disease)    Headache    Hx MRSA infection 09-2014   IBS (irritable bowel syndrome)    Ovarian cyst       Dispostion: Disposition decision including need for hospitalization was considered, and patient discharged from emergency department.    Final Clinical Impression(s) / ED Diagnoses Final diagnoses:  Malaise  Nonintractable headache, unspecified chronicity pattern,  unspecified headache type  Chest pain with low risk of acute coronary syndrome        Sloan Leiter, DO 04/22/23 2039

## 2023-04-22 NOTE — ED Triage Notes (Signed)
Headache, HTN and increased sweating since Wednesday. Pt stated that she is pre-diabetic and feels like her sugar is off.   Pt seen in ED yesterday and told her K+ was low. Pt now complains of cramping and stiffness in LEFT arm. Holding arm arm straight. Full ROM when asked to move arm, denies numbness

## 2023-04-22 NOTE — Discharge Instructions (Addendum)
Please call PCP on Monday for follow up  It was a pleasure caring for you today in the emergency department.  Please return to the emergency department for any worsening or worrisome symptoms.

## 2023-05-03 ENCOUNTER — Ambulatory Visit (INDEPENDENT_AMBULATORY_CARE_PROVIDER_SITE_OTHER): Payer: Medicaid Other | Admitting: Family Medicine

## 2023-05-03 ENCOUNTER — Encounter: Payer: Self-pay | Admitting: Family Medicine

## 2023-05-03 VITALS — BP 126/79 | HR 67 | Ht 64.0 in | Wt 184.0 lb

## 2023-05-03 DIAGNOSIS — E038 Other specified hypothyroidism: Secondary | ICD-10-CM

## 2023-05-03 DIAGNOSIS — E7849 Other hyperlipidemia: Secondary | ICD-10-CM

## 2023-05-03 DIAGNOSIS — E559 Vitamin D deficiency, unspecified: Secondary | ICD-10-CM | POA: Diagnosis not present

## 2023-05-03 DIAGNOSIS — R61 Generalized hyperhidrosis: Secondary | ICD-10-CM | POA: Diagnosis not present

## 2023-05-03 DIAGNOSIS — R7301 Impaired fasting glucose: Secondary | ICD-10-CM | POA: Diagnosis not present

## 2023-05-03 NOTE — Progress Notes (Signed)
Established Patient Office Visit  Subjective:  Patient ID: Karen Shields, female    DOB: April 18, 1991  Age: 32 y.o. MRN: [id number]  CC:  Chief Complaint  Patient presents with   Care Management    Check up, has bp concerns, has had high readings at home.     HPI Karen Shields is a 32 y.o. female with past medical history of IBS, GERD, presents for f/u of  chronic medical conditions. For the details of today's visit, please refer to the assessment and plan.     BP Readings from Last 3 Encounters:  05/03/23 126/79  04/22/23 120/74  04/21/23 (!) 123/54   Autonomic Dysfunction refers to a condition where the autonomic nervous system (ANS) does not function properly. The ANS is responsible for regulating involuntary physiological functions that include heart rate, blood pressure, digestion, respiratory rate, and sweating. When the ANS is impaired, it can lead to a range of symptoms and health issues  Past Medical History:  Diagnosis Date   Anxiety    Asthma    WELL CONTROLLED   Asthma    Depression    pt stated   Eczema 2008   Gallstones    GERD (gastroesophageal reflux disease)    Headache    Hx MRSA infection 09-2014   IBS (irritable bowel syndrome)    Ovarian cyst     Past Surgical History:  Procedure Laterality Date   CHOLECYSTECTOMY N/A 01/16/2016   Procedure: LAPAROSCOPIC CHOLECYSTECTOMY;  Surgeon: Lattie Haw, MD;  Location: [facility] ORS;  Service: General;  Laterality: N/A;   ESOPHAGOGASTRODUODENOSCOPY N/A 11/13/2015   LKG:[id number] reflux    OVARIAN CYST REMOVAL     OVARIAN CYST REMOVAL  2011   TUBAL LIGATION     WISDOM TOOTH EXTRACTION      Family History  Problem Relation Age of Onset   Migraines Mother    Diabetes Mother    Anxiety disorder Mother    Depression Mother    Depression Maternal Grandmother    Stroke Maternal Grandfather    Dementia Paternal Grandfather    Colon cancer Neg Hx    Crohn's disease Neg Hx    Celiac disease Neg Hx      Social History   Socioeconomic History   Marital status: Legally Separated    Spouse name: Not on file   Number of children: 2   Years of education: Not on file   Highest education level: Not on file  Occupational History   Not on file  Tobacco Use   Smoking status: Every Day    Current packs/day: 0.75    Average packs/day: 0.8 packs/day for 17.2 years (12.9 ttl pk-yrs)    Types: Cigarettes    Start date: 02/11/2006   Smokeless tobacco: Never  Vaping Use   Vaping status: Former  Substance and Sexual Activity   Alcohol use: Yes    Alcohol/week: 0.0 standard drinks of alcohol    Comment: rarely, maybe once a year   Drug use: Not Currently    Types: Benzodiazepines, Opium   Sexual activity: Yes    Partners: Male    Birth control/protection: Surgical  Other Topics Concern   Not on file  Social History Narrative   ** Merged History Encounter **    Four daughters between herself and husband, two Human resources officer.    Social Determinants of Health   Financial Resource Strain: Not on file  Food Insecurity: Not on file  Transportation Needs: Not on file  Physical Activity: Not on file  Stress: Not on file  Social Connections: Not on file  Intimate Partner Violence: Not on file    Outpatient Medications Prior to Visit  Medication Sig Dispense Refill   budesonide-formoterol (SYMBICORT) 80-4.5 MCG/ACT inhaler Inhale 2 puffs into the lungs 2 (two) times daily. 1 each 3   buprenorphine-naloxone (SUBOXONE) 8-2 mg SUBL SL tablet Place 1 tablet under the tongue 2 (two) times daily.     cetirizine (ZYRTEC) 10 MG tablet Take 10 mg by mouth daily as needed.     Cholecalciferol (VITAMIN D3) 5000 units CAPS Take by mouth.     cyclobenzaprine (FLEXERIL) 10 MG tablet Take 1 tablet (10 mg total) by mouth 3 (three) times daily as needed for muscle spasms. 30 tablet 0   diazepam (VALIUM) 5 MG tablet Take 1 tablet (5 mg total) by mouth every 12 (twelve) hours as needed for anxiety. 6 tablet 0    dicyclomine (BENTYL) 20 MG tablet Take 1 tablet (20 mg total) by mouth in the morning, at noon, in the evening, and at bedtime. 60 tablet 1   loperamide (IMODIUM A-D) 2 MG tablet Take 1 tablet (2 mg total) by mouth 4 (four) times daily as needed for diarrhea or loose stools. 30 tablet 0   montelukast (SINGULAIR) 10 MG tablet Take 1 tablet (10 mg total) by mouth daily. 30 tablet 2   nitrofurantoin, macrocrystal-monohydrate, (MACROBID) 100 MG capsule Take 1 capsule (100 mg total) by mouth 2 (two) times daily. 10 capsule 0   nystatin cream (MYCOSTATIN) Apply topically 2 (two) times daily.     nystatin-triamcinolone ointment (MYCOLOG) Apply 1 Application topically 2 (two) times daily. 30 g 0   ondansetron (ZOFRAN) 4 MG tablet Take 1 tablet (4 mg total) by mouth every 8 (eight) hours as needed for nausea or vomiting. 30 tablet 1   phenazopyridine (PYRIDIUM) 100 MG tablet Take 1 tablet (100 mg total) by mouth 3 (three) times daily as needed for pain. 10 tablet 0   SUBOXONE 8-2 MG FILM Place under the tongue 2 (two) times daily.     VENTOLIN HFA 108 (90 Base) MCG/ACT inhaler INHALE TWO PUFFS INTO LUNGS FOUR TIMES DAILY 18 g 0   vortioxetine HBr (TRINTELLIX) 10 MG TABS tablet Take 1 tablet (10 mg total) by mouth daily. 30 tablet 2   ALPRAZolam (XANAX) 1 MG tablet Take 1 tablet (1 mg total) by mouth daily as needed for anxiety. 30 tablet 0   pantoprazole (PROTONIX) 40 MG tablet Take 1 tablet (40 mg total) by mouth daily. 30 tablet 1   No facility-administered medications prior to visit.    Allergies  Allergen Reactions   Aloe Swelling   Latex Dermatitis    ROS Review of Systems  Constitutional:  Negative for chills and fever.  Eyes:  Negative for visual disturbance.  Respiratory:  Negative for chest tightness and shortness of breath.   Neurological:  Negative for dizziness and headaches.      Objective:    Physical Exam HENT:     Head: Normocephalic.     Mouth/Throat:     Mouth:  Mucous membranes are moist.  Cardiovascular:     Rate and Rhythm: Normal rate.     Heart sounds: Normal heart sounds.  Pulmonary:     Effort: Pulmonary effort is normal.     Breath sounds: Normal breath sounds.  Neurological:     Mental Status: She is alert.     BP 126/79  Pulse 67   Ht 5\' 4"  (1.626 m)   Wt 184 lb (83.5 kg)   SpO2 96%   BMI 31.58 kg/m  Wt Readings from Last 3 Encounters:  05/03/23 184 lb (83.5 kg)  04/22/23 185 lb (83.9 kg)  04/21/23 181 lb (82.1 kg)    Lab Results  Component Value Date   TSH 0.658 04/22/2023   Lab Results  Component Value Date   WBC 11.0 (H) 04/22/2023   HGB 14.8 04/22/2023   HCT 43.1 04/22/2023   MCV 87.4 04/22/2023   PLT 405 (H) 04/22/2023   Lab Results  Component Value Date   NA 135 04/22/2023   K 3.6 04/22/2023   CO2 22 04/22/2023   GLUCOSE 119 (H) 04/22/2023   BUN 9 04/22/2023   CREATININE 0.67 04/22/2023   BILITOT 0.9 04/22/2023   ALKPHOS 79 04/22/2023   AST 26 04/22/2023   ALT 24 04/22/2023   PROT 7.5 04/22/2023   ALBUMIN 4.3 04/22/2023   CALCIUM 9.0 04/22/2023   ANIONGAP 7 04/22/2023   EGFR 102 11/12/2022   Lab Results  Component Value Date   CHOL 157 11/12/2022   Lab Results  Component Value Date   HDL 43 11/12/2022   Lab Results  Component Value Date   LDLCALC 100 (H) 11/12/2022   Lab Results  Component Value Date   TRIG 73 11/12/2022   Lab Results  Component Value Date   CHOLHDL 3.7 11/12/2022   Lab Results  Component Value Date   HGBA1C 5.6 11/12/2022      Assessment & Plan:  Generalized hyperhidrosis Assessment & Plan: Onset since age 61, the patient reports excessive generalized drenching sweating, which has become more frequent since May 2024. The sweating does not occur during sleep, happens randomly, and is not associated with increased activity. She has also noticed elevated ambulatory blood pressure readings. Her blood pressure is controlled today, and she is asymptomatic.  I  encouraged her to monitor her blood pressure readings at home and bring a log to her next appointment. Additionally, I advised reducing her intake of high-sodium foods and increasing physical activity. A referral to neurology has been placed for evaluation of potential autonomic dysfunction.   Orders: -     Ambulatory referral to Neurology  IFG (impaired fasting glucose) -     Hemoglobin A1c  Vitamin D deficiency -     VITAMIN D 25 Hydroxy (Vit-D Deficiency, Fractures)  Other specified hypothyroidism -     TSH + free T4  Other hyperlipidemia -     Lipid panel -     CMP14+EGFR -     CBC with Differential/Platelet  Note: This chart has been completed using Engineer, civil (consulting) software, and while attempts have been made to ensure accuracy, certain words and phrases may not be transcribed as intended.    Follow-up: Return in about 1 month (around 06/03/2023).   Gilmore Laroche, FNP

## 2023-05-03 NOTE — Patient Instructions (Addendum)
I appreciate the opportunity to provide care to you today!    Follow up:  1 months for BP  Labs: please stop by the lab today to get your blood drawn (CBC, CMP, TSH, Lipid profile, HgA1c, Vit D)  Nonpharmacological interventions for managing hyperhidrosis (excessive sweating) can be effective in reducing symptoms and improving quality of life. Here are several strategies to consider: Lifestyle Modifications: -Wear Breathable Fabrics: Choose clothing made from moisture-wicking and breathable materials, such as cotton or specialized synthetic fabrics designed to wick sweat away from the skin. -Avoid Triggers: Identify and avoid foods and beverages that may trigger excessive sweating, such as spicy foods, caffeine, and alcohol. Hygiene Practices: -Frequent Showering: Take regular showers with antibacterial soap to help reduce bacterial growth on the skin, which can contribute to odor and discomfort. -Antiperspirants: Use over-the-counter or prescription-strength antiperspirants containing aluminum chloride to help reduce sweat production. Apply them to dry skin before bedtime for best results. Body Cooling: -Stay Cool: Use fans, air conditioning, or cooling towels to keep your body temperature down and reduce sweating. Hydration: Drink plenty of water to stay hydrated and help regulate body temperature. Sweat-Resistant Products: -Anti-Sweat Pads: Use absorbent pads or liners designed to be worn inside clothing to manage and absorb sweat. Specialized Clothing: Wear moisture-wicking undershirts or sweat-resistant clothing to manage and conceal sweat. Dietary Changes: -Balanced Diet: Maintain a balanced diet to avoid foods that can exacerbate sweating. A diet rich in fruits, vegetables, and lean proteins may help manage overall body function. Regular Exercise: -Exercise: Engage in regular physical activity to improve overall health and potentially reduce stress-related sweating. However, adapt your  exercise routine to include cooling techniques to manage sweat during workouts.  Referrals today-  Neurology   Attached with your AVS, you will find valuable resources for self-education. I highly recommend dedicating some time to thoroughly examine them.   Please continue to a heart-healthy diet and increase your physical activities. Try to exercise for at least five days a week.    It was a pleasure to see you and I look forward to continuing to work together on your health and well-being. Please do not hesitate to call the office if you need care or have questions about your care.  In case of emergency, please visit the Emergency Department for urgent care, or contact our clinic at [phone] to schedule an appointment. We're here to help you!   Have a wonderful day and week. With Gratitude, Gilmore Laroche MSN, FNP-BC

## 2023-05-05 ENCOUNTER — Telehealth (HOSPITAL_COMMUNITY): Payer: Self-pay

## 2023-05-05 DIAGNOSIS — F4310 Post-traumatic stress disorder, unspecified: Secondary | ICD-10-CM

## 2023-05-05 MED ORDER — ALPRAZOLAM 1 MG PO TABS
1.0000 mg | ORAL_TABLET | Freq: Every day | ORAL | 0 refills | Status: DC | PRN
Start: 2023-05-05 — End: 2023-06-06

## 2023-05-05 NOTE — Telephone Encounter (Signed)
Done

## 2023-05-05 NOTE — Telephone Encounter (Signed)
Pt called in requesting refill on her ALPRAZolam Karen Shields) 1 MG tablet sent in to [facility]. Pt scheduled with Dr Tenny Craw 06/20/23. Please advise.

## 2023-05-05 NOTE — Telephone Encounter (Signed)
Spoke with pt advised rx has been sent to pharmacy she verbalized understanding

## 2023-05-06 DIAGNOSIS — R61 Generalized hyperhidrosis: Secondary | ICD-10-CM | POA: Insufficient documentation

## 2023-05-06 NOTE — Assessment & Plan Note (Signed)
Onset since age 32, the patient reports excessive generalized drenching sweating, which has become more frequent since May 2024. The sweating does not occur during sleep, happens randomly, and is not associated with increased activity. She has also noticed elevated ambulatory blood pressure readings. Her blood pressure is controlled today, and she is asymptomatic.  I encouraged her to monitor her blood pressure readings at home and bring a log to her next appointment. Additionally, I advised reducing her intake of high-sodium foods and increasing physical activity. A referral to neurology has been placed for evaluation of potential autonomic dysfunction.

## 2023-05-10 DIAGNOSIS — F4312 Post-traumatic stress disorder, chronic: Secondary | ICD-10-CM | POA: Diagnosis not present

## 2023-05-10 DIAGNOSIS — F411 Generalized anxiety disorder: Secondary | ICD-10-CM | POA: Diagnosis not present

## 2023-05-13 DIAGNOSIS — E559 Vitamin D deficiency, unspecified: Secondary | ICD-10-CM | POA: Diagnosis not present

## 2023-05-13 DIAGNOSIS — R7301 Impaired fasting glucose: Secondary | ICD-10-CM | POA: Diagnosis not present

## 2023-05-13 DIAGNOSIS — E038 Other specified hypothyroidism: Secondary | ICD-10-CM | POA: Diagnosis not present

## 2023-05-13 DIAGNOSIS — E7849 Other hyperlipidemia: Secondary | ICD-10-CM | POA: Diagnosis not present

## 2023-05-15 ENCOUNTER — Other Ambulatory Visit: Payer: Self-pay | Admitting: Family Medicine

## 2023-05-15 DIAGNOSIS — E559 Vitamin D deficiency, unspecified: Secondary | ICD-10-CM

## 2023-05-15 DIAGNOSIS — R61 Generalized hyperhidrosis: Secondary | ICD-10-CM

## 2023-05-15 MED ORDER — VITAMIN D (ERGOCALCIFEROL) 1.25 MG (50000 UNIT) PO CAPS
50000.0000 [IU] | ORAL_CAPSULE | ORAL | 1 refills | Status: AC
Start: 2023-05-15 — End: ?

## 2023-05-15 NOTE — Progress Notes (Signed)
Please inform the patient that her labs indicate she is prediabetic. I recommend decreasing her intake of high-sugar foods and beverages, along with increasing physical activity. Her vitamin D levels are low, and a prescription for a weekly vitamin D supplement has been sent to her pharmacy.  Her cholesterol levels are elevated, and I recommend lifestyle modifications, including reducing her intake of saturated fats, trans fats, and cholesterol, while increasing her intake of fruits, vegetables, whole grains, and omega-3 fatty acids.  Additionally, please inform the patient that the referral to neurology was denied, and a new referral has been placed to dermatology for generalized hyperhidrosis.

## 2023-05-15 NOTE — Progress Notes (Signed)
The ASCVD Risk score (Arnett DK, et al., 2019) failed to calculate for the following reasons:    The 2019 ASCVD risk score is only valid for ages 40 to 79

## 2023-05-19 ENCOUNTER — Ambulatory Visit (HOSPITAL_COMMUNITY): Payer: Medicaid Other | Admitting: Psychiatry

## 2023-05-31 ENCOUNTER — Other Ambulatory Visit (HOSPITAL_COMMUNITY): Payer: Self-pay | Admitting: Psychiatry

## 2023-05-31 DIAGNOSIS — F4310 Post-traumatic stress disorder, unspecified: Secondary | ICD-10-CM

## 2023-06-01 DIAGNOSIS — H5213 Myopia, bilateral: Secondary | ICD-10-CM | POA: Diagnosis not present

## 2023-06-03 ENCOUNTER — Encounter: Payer: Self-pay | Admitting: Family Medicine

## 2023-06-03 ENCOUNTER — Ambulatory Visit (INDEPENDENT_AMBULATORY_CARE_PROVIDER_SITE_OTHER): Payer: Medicaid Other | Admitting: Family Medicine

## 2023-06-03 VITALS — BP 122/83 | HR 86 | Ht 64.0 in | Wt 180.0 lb

## 2023-06-03 DIAGNOSIS — R61 Generalized hyperhidrosis: Secondary | ICD-10-CM | POA: Diagnosis not present

## 2023-06-03 NOTE — Assessment & Plan Note (Signed)
We will initiate a trial of over-the-counter Estroven Complete Multi-Symptom supplement to help manage your symptoms. The patient is encouraged to start taking the supplement as directed.

## 2023-06-03 NOTE — Progress Notes (Signed)
Established Patient Office Visit  Subjective:  Patient ID: Karen Shields, female    DOB: 09-26-1990  Age: 32 y.o. MRN: [id number]  CC:  Chief Complaint  Patient presents with   Care Management    1 month f/u, hasn't heard from derm referral    HPI Karen Shields is a 32 y.o. female presents for generalized hyperhidrosis follow up.  Generalized Hyperhidrosis: The patient reports experiencing six episodes of excessive sweating since her last visit. Her blood pressure has remained well-controlled. She also reports that she has not heard back from the dermatology referral. However, she acknowledges difficulty receiving calls, as noted by friends and family who have been unable to reach her.  Past Medical History:  Diagnosis Date   Anxiety    Asthma    WELL CONTROLLED   Asthma    Depression    pt stated   Eczema 2008   Gallstones    GERD (gastroesophageal reflux disease)    Headache    Hx MRSA infection 09-2014   IBS (irritable bowel syndrome)    Ovarian cyst     Past Surgical History:  Procedure Laterality Date   CHOLECYSTECTOMY N/A 01/16/2016   Procedure: LAPAROSCOPIC CHOLECYSTECTOMY;  Surgeon: Lattie Haw, MD;  Location: [facility] ORS;  Service: General;  Laterality: N/A;   ESOPHAGOGASTRODUODENOSCOPY N/A 11/13/2015   [id number] reflux    OVARIAN CYST REMOVAL     OVARIAN CYST REMOVAL  2011   TUBAL LIGATION     WISDOM TOOTH EXTRACTION      Family History  Problem Relation Age of Onset   Migraines Mother    Diabetes Mother    Anxiety disorder Mother    Depression Mother    Depression Maternal Grandmother    Stroke Maternal Grandfather    Dementia Paternal Grandfather    Colon cancer Neg Hx    Crohn's disease Neg Hx    Celiac disease Neg Hx     Social History   Socioeconomic History   Marital status: Legally Separated    Spouse name: Not on file   Number of children: 2   Years of education: Not on file   Highest education level: Not on file   Occupational History   Not on file  Tobacco Use   Smoking status: Every Day    Current packs/day: 0.75    Average packs/day: 0.8 packs/day for 17.3 years (13.0 ttl pk-yrs)    Types: Cigarettes    Start date: 02/11/2006   Smokeless tobacco: Never  Vaping Use   Vaping status: Former  Substance and Sexual Activity   Alcohol use: Yes    Alcohol/week: 0.0 standard drinks of alcohol    Comment: rarely, maybe once a year   Drug use: Not Currently    Types: Benzodiazepines, Opium   Sexual activity: Yes    Partners: Male    Birth control/protection: Surgical  Other Topics Concern   Not on file  Social History Narrative   ** Merged History Encounter **    Four daughters between herself and husband, two Human resources officer.    Social Determinants of Health   Financial Resource Strain: Not on file  Food Insecurity: Not on file  Transportation Needs: Not on file  Physical Activity: Not on file  Stress: Not on file  Social Connections: Not on file  Intimate Partner Violence: Not on file    Outpatient Medications Prior to Visit  Medication Sig Dispense Refill   ALPRAZolam (XANAX) 1 MG tablet Take 1 tablet (  1 mg total) by mouth daily as needed for anxiety. 30 tablet 0   budesonide-formoterol (SYMBICORT) 80-4.5 MCG/ACT inhaler Inhale 2 puffs into the lungs 2 (two) times daily. 1 each 3   buprenorphine-naloxone (SUBOXONE) 8-2 mg SUBL SL tablet Place 1 tablet under the tongue 2 (two) times daily.     cetirizine (ZYRTEC) 10 MG tablet Take 10 mg by mouth daily as needed.     cyclobenzaprine (FLEXERIL) 10 MG tablet Take 1 tablet (10 mg total) by mouth 3 (three) times daily as needed for muscle spasms. 30 tablet 0   diazepam (VALIUM) 5 MG tablet Take 1 tablet (5 mg total) by mouth every 12 (twelve) hours as needed for anxiety. 6 tablet 0   dicyclomine (BENTYL) 20 MG tablet Take 1 tablet (20 mg total) by mouth in the morning, at noon, in the evening, and at bedtime. 60 tablet 1   loperamide (IMODIUM  A-D) 2 MG tablet Take 1 tablet (2 mg total) by mouth 4 (four) times daily as needed for diarrhea or loose stools. 30 tablet 0   montelukast (SINGULAIR) 10 MG tablet Take 1 tablet (10 mg total) by mouth daily. 30 tablet 2   nitrofurantoin, macrocrystal-monohydrate, (MACROBID) 100 MG capsule Take 1 capsule (100 mg total) by mouth 2 (two) times daily. 10 capsule 0   nystatin cream (MYCOSTATIN) Apply topically 2 (two) times daily.     nystatin-triamcinolone ointment (MYCOLOG) Apply 1 Application topically 2 (two) times daily. 30 g 0   ondansetron (ZOFRAN) 4 MG tablet Take 1 tablet (4 mg total) by mouth every 8 (eight) hours as needed for nausea or vomiting. 30 tablet 1   phenazopyridine (PYRIDIUM) 100 MG tablet Take 1 tablet (100 mg total) by mouth 3 (three) times daily as needed for pain. 10 tablet 0   SUBOXONE 8-2 MG FILM Place under the tongue 2 (two) times daily.     VENTOLIN HFA 108 (90 Base) MCG/ACT inhaler INHALE TWO PUFFS INTO LUNGS FOUR TIMES DAILY 18 g 0   Vitamin D, Ergocalciferol, (DRISDOL) 1.25 MG (50000 UNIT) CAPS capsule Take 1 capsule (50,000 Units total) by mouth every 7 (seven) days. 20 capsule 1   vortioxetine HBr (TRINTELLIX) 10 MG TABS tablet Take 1 tablet (10 mg total) by mouth daily. 30 tablet 2   pantoprazole (PROTONIX) 40 MG tablet Take 1 tablet (40 mg total) by mouth daily. 30 tablet 1   No facility-administered medications prior to visit.    Allergies  Allergen Reactions   Aloe Swelling   Latex Dermatitis    ROS Review of Systems  Constitutional:  Negative for chills and fever.  Eyes:  Negative for visual disturbance.  Respiratory:  Negative for chest tightness and shortness of breath.   Neurological:  Negative for dizziness and headaches.      Objective:    Physical Exam HENT:     Head: Normocephalic.     Mouth/Throat:     Mouth: Mucous membranes are moist.  Cardiovascular:     Rate and Rhythm: Normal rate.     Heart sounds: Normal heart sounds.   Pulmonary:     Effort: Pulmonary effort is normal.     Breath sounds: Normal breath sounds.  Neurological:     Mental Status: She is alert.     BP 122/83   Pulse 86   Ht 5\' 4"  (1.626 m)   Wt 180 lb 0.6 oz (81.7 kg)   SpO2 97%   BMI 30.90 kg/m  Wt Readings from  Last 3 Encounters:  06/03/23 180 lb 0.6 oz (81.7 kg)  05/03/23 184 lb (83.5 kg)  04/22/23 185 lb (83.9 kg)    Lab Results  Component Value Date   TSH 1.450 05/13/2023   Lab Results  Component Value Date   WBC 10.6 05/13/2023   HGB 13.7 05/13/2023   HCT 41.4 05/13/2023   MCV 87 05/13/2023   PLT 355 05/13/2023   Lab Results  Component Value Date   NA 137 05/13/2023   K 4.2 05/13/2023   CO2 18 (L) 05/13/2023   GLUCOSE 91 05/13/2023   BUN 9 05/13/2023   CREATININE 0.74 05/13/2023   BILITOT 0.4 05/13/2023   ALKPHOS 91 05/13/2023   AST 22 05/13/2023   ALT 17 05/13/2023   PROT 6.6 05/13/2023   ALBUMIN 4.3 05/13/2023   CALCIUM 9.5 05/13/2023   ANIONGAP 7 04/22/2023   EGFR 110 05/13/2023   Lab Results  Component Value Date   CHOL 169 05/13/2023   Lab Results  Component Value Date   HDL 33 (L) 05/13/2023   Lab Results  Component Value Date   LDLCALC 112 (H) 05/13/2023   Lab Results  Component Value Date   TRIG 130 05/13/2023   Lab Results  Component Value Date   CHOLHDL 5.1 (H) 05/13/2023   Lab Results  Component Value Date   HGBA1C 5.7 (H) 05/13/2023      Assessment & Plan:  Generalized hyperhidrosis Assessment & Plan: We will initiate a trial of over-the-counter Estroven Complete Multi-Symptom supplement to help manage your symptoms. The patient is encouraged to start taking the supplement as directed.   Note: This chart has been completed using Engineer, civil (consulting) software, and while attempts have been made to ensure accuracy, certain words and phrases may not be transcribed as intended.    Follow-up: Return in about 1 month (around 07/03/2023).   Gilmore Laroche, FNP

## 2023-06-03 NOTE — Patient Instructions (Addendum)
I appreciate the opportunity to provide care to you today!    Follow up:  1 month  I recommend taking otc Estroven complete multi-symptom supplement to help your symptoms   Please continue to a heart-healthy diet and increase your physical activities. Try to exercise for at least five days a week.    It was a pleasure to see you and I look forward to continuing to work together on your health and well-being. Please do not hesitate to call the office if you need care or have questions about your care.  In case of emergency, please visit the Emergency Department for urgent care, or contact our clinic at [phone] to schedule an appointment. We're here to help you!   Have a wonderful day and week. With Gratitude, Gilmore Laroche MSN, FNP-BC

## 2023-06-06 ENCOUNTER — Emergency Department (HOSPITAL_COMMUNITY)
Admission: EM | Admit: 2023-06-06 | Discharge: 2023-06-07 | Disposition: A | Discharge status: Home / Self Care | Payer: Medicaid Other | Attending: Emergency Medicine | Admitting: Emergency Medicine

## 2023-06-06 ENCOUNTER — Telehealth (HOSPITAL_COMMUNITY): Payer: Self-pay | Admitting: *Deleted

## 2023-06-06 ENCOUNTER — Other Ambulatory Visit: Payer: Self-pay

## 2023-06-06 ENCOUNTER — Other Ambulatory Visit (HOSPITAL_COMMUNITY): Payer: Self-pay | Admitting: Psychiatry

## 2023-06-06 ENCOUNTER — Encounter (HOSPITAL_COMMUNITY): Payer: Self-pay | Admitting: Emergency Medicine

## 2023-06-06 DIAGNOSIS — N3001 Acute cystitis with hematuria: Secondary | ICD-10-CM | POA: Diagnosis not present

## 2023-06-06 DIAGNOSIS — F4310 Post-traumatic stress disorder, unspecified: Secondary | ICD-10-CM

## 2023-06-06 DIAGNOSIS — Z91040 Latex allergy status: Secondary | ICD-10-CM | POA: Diagnosis not present

## 2023-06-06 DIAGNOSIS — R300 Dysuria: Secondary | ICD-10-CM | POA: Diagnosis present

## 2023-06-06 DIAGNOSIS — J45909 Unspecified asthma, uncomplicated: Secondary | ICD-10-CM | POA: Diagnosis not present

## 2023-06-06 LAB — URINALYSIS, ROUTINE W REFLEX MICROSCOPIC
Bilirubin Urine: NEGATIVE
Glucose, UA: NEGATIVE mg/dL
Ketones, ur: NEGATIVE mg/dL
Nitrite: NEGATIVE
Protein, ur: 100 mg/dL — AB
RBC / HPF: >50 RBC/hpf (ref 0–5)
Specific Gravity, Urine: 1.021 (ref 1.005–1.030)
WBC, UA: >50 WBC/hpf (ref 0–5)
pH: 6 (ref 5.0–8.0)

## 2023-06-06 MED ORDER — ALPRAZOLAM 1 MG PO TABS
1.0000 mg | ORAL_TABLET | Freq: Every day | ORAL | 0 refills | Status: DC | PRN
Start: 2023-06-06 — End: 2023-07-04

## 2023-06-06 NOTE — Telephone Encounter (Signed)
Patient called stating she is needing refills for her Xanax to be sent to [facility]. Per pt chart her next appt is on the 30th of this month.

## 2023-06-06 NOTE — ED Triage Notes (Signed)
Pt presents with hematuria and dysuria since Saturday.

## 2023-06-07 LAB — PREGNANCY, URINE: Preg Test, Ur: NEGATIVE

## 2023-06-07 MED ORDER — POLYETHYLENE GLYCOL 3350 17 G PO PACK: 17.0000 g | PACK | Freq: Every day | ORAL | 0 refills | Status: AC

## 2023-06-07 MED ORDER — CEPHALEXIN 500 MG PO CAPS: 500.0000 mg | ORAL_CAPSULE | Freq: Three times a day (TID) | ORAL | 0 refills | Status: DC

## 2023-06-07 MED ORDER — PHENAZOPYRIDINE HCL 200 MG PO TABS: 200.0000 mg | ORAL_TABLET | Freq: Three times a day (TID) | ORAL | 0 refills | Status: AC

## 2023-06-07 MED ORDER — CEPHALEXIN 500 MG PO CAPS
500.0000 mg | ORAL_CAPSULE | Freq: Once | ORAL | Status: AC
  Administered 2023-06-07: 500 mg via ORAL
  Filled 2023-06-07: qty 1

## 2023-06-07 MED ORDER — PHENAZOPYRIDINE HCL 100 MG PO TABS
100.0000 mg | ORAL_TABLET | Freq: Once | ORAL | Status: AC
  Administered 2023-06-07: 100 mg via ORAL
  Filled 2023-06-07: qty 1

## 2023-06-07 MED ORDER — ONDANSETRON 4 MG PO TBDP: 4.0000 mg | ORAL_TABLET | Freq: Three times a day (TID) | ORAL | 0 refills | Status: DC | PRN

## 2023-06-07 NOTE — ED Provider Notes (Signed)
[place] EMERGENCY DEPARTMENT AT [facility] Provider Note   CSN: [id number] Arrival date & time: 06/06/23  2018     History  Chief Complaint  Patient presents with   Dysuria    Karen Shields is a 32 y.o. female.  The history is provided by the patient.  Patient with history of anxiety and asthma presents for concerns for UTI.  For over 24 hours she has had urinary frequency and dysuria.  She also reports hematuria.  No fevers or vomiting but she does report nausea.  No vaginal bleeding or discharge.  No symptoms of a yeast infection.  No severe back pain.  No previous abdominal or urologic surgery.  She reports previous UTIs but this feels more severe she also reports constipation that is more chronic in nature    Past Medical History:  Diagnosis Date   Anxiety    Asthma    WELL CONTROLLED   Asthma    Depression    pt stated   Eczema 2008   Gallstones    GERD (gastroesophageal reflux disease)    Headache    Hx MRSA infection 09-2014   IBS (irritable bowel syndrome)    Ovarian cyst     Home Medications Prior to Admission medications   Medication Sig Start Date End Date Taking? Authorizing Provider  cephALEXin (KEFLEX) 500 MG capsule Take 1 capsule (500 mg total) by mouth 3 (three) times daily. 06/07/23  Yes Zadie Rhine, MD  ondansetron (ZOFRAN-ODT) 4 MG disintegrating tablet Take 1 tablet (4 mg total) by mouth every 8 (eight) hours as needed. 06/07/23  Yes Zadie Rhine, MD  phenazopyridine (PYRIDIUM) 200 MG tablet Take 1 tablet (200 mg total) by mouth 3 (three) times daily. 06/07/23  Yes Zadie Rhine, MD  polyethylene glycol (MIRALAX / GLYCOLAX) 17 g packet Take 17 g by mouth daily. 06/07/23  Yes Zadie Rhine, MD  ALPRAZolam Prudy Feeler) 1 MG tablet Take 1 tablet (1 mg total) by mouth daily as needed for anxiety. 06/06/23   Myrlene Broker, MD  budesonide-formoterol [facility]) 80-4.5 MCG/ACT inhaler Inhale 2 puffs into the lungs 2 (two) times daily.  08/09/22   Gilmore Laroche, FNP  buprenorphine-naloxone (SUBOXONE) 8-2 mg SUBL SL tablet Place 1 tablet under the tongue 2 (two) times daily. 01/19/22   [provider]  cetirizine (ZYRTEC) 10 MG tablet Take 10 mg by mouth daily as needed.    [provider]  montelukast (SINGULAIR) 10 MG tablet Take 1 tablet (10 mg total) by mouth daily. 08/29/22 08/29/23  Vassie Loll, MD  nystatin cream (MYCOSTATIN) Apply topically 2 (two) times daily. 08/09/22   [provider]  nystatin-triamcinolone ointment (MYCOLOG) Apply 1 Application topically 2 (two) times daily. 08/09/22   Gilmore Laroche, FNP  pantoprazole (PROTONIX) 40 MG tablet Take 1 tablet (40 mg total) by mouth daily. 11/05/22 01/04/23  Gilmore Laroche, FNP  SUBOXONE 8-2 MG FILM Place under the tongue 2 (two) times daily. 10/12/22   [provider]  VENTOLIN HFA 108 (90 Base) MCG/ACT inhaler INHALE TWO PUFFS INTO LUNGS FOUR TIMES DAILY 03/17/23   Gilmore Laroche, FNP  Vitamin D, Ergocalciferol, (DRISDOL) 1.25 MG (50000 UNIT) CAPS capsule Take 1 capsule (50,000 Units total) by mouth every 7 (seven) days. 05/15/23   Gilmore Laroche, FNP  vortioxetine HBr (TRINTELLIX) 10 MG TABS tablet Take 1 tablet (10 mg total) by mouth daily. 03/17/23   Myrlene Broker, MD      Allergies    Aloe and  Latex    Review of Systems   Review of Systems  Constitutional:  Negative for fever.  Gastrointestinal:  Positive for nausea. Negative for vomiting.  Genitourinary:  Positive for decreased urine volume, dysuria, frequency, hematuria and urgency. Negative for vaginal bleeding and vaginal discharge.    Physical Exam Updated Vital Signs BP (!) 143/96 (BP Location: Left Arm)   Pulse 88   Temp 98.1 F (36.7 C) (Oral)   Resp 20   Ht 1.6 m (5\' 3" )   Wt 81.6 kg   LMP 05/20/2023   SpO2 99%   BMI 31.89 kg/m  Physical Exam CONSTITUTIONAL: Well developed/well nourished, anxious HEAD: Normocephalic/atraumatic ENMT: Mucous membranes  moist NECK: supple no meningeal signs CV: S1/S2 noted, no murmurs/rubs/gallops noted LUNGS: Lungs are clear to auscultation bilaterally, no apparent distress ABDOMEN: soft, nontender, no rebound or guarding, bowel sounds noted throughout abdomen GU:no cva tenderness NEURO: Pt is awake/alert/appropriate, moves all extremitiesx4.  No facial droop.   SKIN: warm, color normal PSYCH: Anxious  ED Results / Procedures / Treatments   Labs (all labs ordered are listed, but only abnormal results are displayed) Labs Reviewed  URINALYSIS, ROUTINE W REFLEX MICROSCOPIC - Abnormal; Notable for the following components:      Result Value   APPearance HAZY (*)    Hgb urine dipstick MODERATE (*)    Protein, ur 100 (*)    Leukocytes,Ua MODERATE (*)    Bacteria, UA RARE (*)    All other components within normal limits  PREGNANCY, URINE    EKG None  Radiology No results found.  Procedures Procedures    Medications Ordered in ED Medications  cephALEXin (KEFLEX) capsule 500 mg (500 mg Oral Given 06/07/23 0134)  phenazopyridine (PYRIDIUM) tablet 100 mg (100 mg Oral Given by Other 06/07/23 0134)    ED Course/ Medical Decision Making/ A&P Clinical Course as of 06/07/23 0145  Tue Jun 07, 2023  0110 Glori LuisMarland Kitchen): MODERATE UTI noted [DW]    Clinical Course User Index [DW] Zadie Rhine, MD                                 Medical Decision Making Amount and/or Complexity of Data Reviewed Labs: ordered. Decision-making details documented in ED Course.  Risk OTC drugs. Prescription drug management.   Patient presents with signs and symptoms of UTI.  Differential includes cystitis, pyelonephritis, ureteral stone, PID, pregnancy, ectopic pregnancy  Labs consistent with hemorrhagic cystitis. Overall patient is very well-appearing.  She is not septic appearing.  Suspect hypertension is due to pain and anxiety No indication for further workup, no negation for imaging She is safe for  outpatient management.  We discussed tricked return precautions       Final Clinical Impression(s) / ED Diagnoses Final diagnoses:  Acute cystitis with hematuria    Rx / DC Orders ED Discharge Orders          Ordered    cephALEXin (KEFLEX) 500 MG capsule  3 times daily        06/07/23 0127    phenazopyridine (PYRIDIUM) 200 MG tablet  3 times daily        06/07/23 0127    polyethylene glycol (MIRALAX / GLYCOLAX) 17 g packet  Daily        06/07/23 0129    ondansetron (ZOFRAN-ODT) 4 MG disintegrating tablet  Every 8 hours PRN        06/07/23 0131  Zadie Rhine, MD 06/07/23 [phone]

## 2023-06-20 ENCOUNTER — Telehealth (HOSPITAL_COMMUNITY): Payer: Medicaid Other | Admitting: Psychiatry

## 2023-06-20 ENCOUNTER — Encounter (HOSPITAL_COMMUNITY): Payer: Self-pay

## 2023-06-20 NOTE — Progress Notes (Signed)
No show

## 2023-06-24 ENCOUNTER — Ambulatory Visit (INDEPENDENT_AMBULATORY_CARE_PROVIDER_SITE_OTHER): Payer: Medicaid Other | Admitting: Psychiatry

## 2023-06-24 DIAGNOSIS — F321 Major depressive disorder, single episode, moderate: Secondary | ICD-10-CM

## 2023-06-24 DIAGNOSIS — F4310 Post-traumatic stress disorder, unspecified: Secondary | ICD-10-CM | POA: Diagnosis not present
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
## 2023-07-04 ENCOUNTER — Other Ambulatory Visit (HOSPITAL_COMMUNITY): Payer: Self-pay | Admitting: Psychiatry

## 2023-07-04 ENCOUNTER — Telehealth (HOSPITAL_COMMUNITY): Payer: Self-pay | Admitting: *Deleted

## 2023-07-04 DIAGNOSIS — F4310 Post-traumatic stress disorder, unspecified: Secondary | ICD-10-CM

## 2023-07-04 MED ORDER — ALPRAZOLAM 1 MG PO TABS
1.0000 mg | ORAL_TABLET | Freq: Every day | ORAL | 0 refills | Status: DC | PRN
Start: 2023-07-04 — End: 2023-07-07

## 2023-07-04 MED ORDER — VORTIOXETINE HBR 10 MG PO TABS: 10.0000 mg | ORAL_TABLET | Freq: Every day | ORAL | 2 refills | Status: DC

## 2023-07-04 NOTE — Telephone Encounter (Signed)
LMOM

## 2023-07-04 NOTE — Telephone Encounter (Signed)
Patient called for refills of her Trintellix and Xanax. Per pt chart she have an appt for 07-07-23. Medication to be sent to [facility].

## 2023-07-05 DIAGNOSIS — F4312 Post-traumatic stress disorder, chronic: Secondary | ICD-10-CM | POA: Diagnosis not present

## 2023-07-05 DIAGNOSIS — F411 Generalized anxiety disorder: Secondary | ICD-10-CM | POA: Diagnosis not present

## 2023-07-07 ENCOUNTER — Telehealth (HOSPITAL_COMMUNITY): Payer: Medicaid Other | Admitting: Psychiatry

## 2023-07-07 ENCOUNTER — Encounter (HOSPITAL_COMMUNITY): Payer: Self-pay | Admitting: Psychiatry

## 2023-07-07 DIAGNOSIS — F4310 Post-traumatic stress disorder, unspecified: Secondary | ICD-10-CM

## 2023-07-07 MED ORDER — ALPRAZOLAM 1 MG PO TABS
1.0000 mg | ORAL_TABLET | Freq: Every day | ORAL | 2 refills | Status: DC | PRN
Start: 2023-07-07 — End: 2023-10-17

## 2023-07-07 MED ORDER — VORTIOXETINE HBR 10 MG PO TABS: 10.0000 mg | ORAL_TABLET | Freq: Every day | ORAL | 2 refills | Status: DC

## 2023-07-07 MED ORDER — CLONIDINE HCL 0.1 MG PO TABS: 0.1000 mg | ORAL_TABLET | Freq: Two times a day (BID) | ORAL | 2 refills | Status: DC | PRN

## 2023-07-07 MED ORDER — ONDANSETRON HCL 4 MG PO TABS: 4.0000 mg | ORAL_TABLET | Freq: Every day | ORAL | 1 refills | Status: DC | PRN

## 2023-07-07 NOTE — Progress Notes (Signed)
Virtual Visit via Video Note  I connected with Karen Shields on 07/07/23 at  9:00 AM EDT by a video enabled telemedicine application and verified that I am speaking with the correct person using two identifiers.  Location: Patient: home Provider: office   I discussed the limitations of evaluation and management by telemedicine and the availability of in person appointments. The patient expressed understanding and agreed to proceed.      I discussed the assessment and treatment plan with the patient. The patient was provided an opportunity to ask questions and all were answered. The patient agreed with the plan and demonstrated an understanding of the instructions.   The patient was advised to call back or seek an in-person evaluation if the symptoms worsen or if the condition fails to improve as anticipated.  I provided 15 minutes of non-face-to-face time during this encounter.   [name], MD  [facility] MD/PA/NP OP Progress Note  07/07/2023 9:25 AM Karen Shields  MRN:  [id number]  Chief Complaint:  Chief Complaint  Patient presents with   Depression   Anxiety   Follow-up   HPI: This patient is a 32 year old separated white female who lives with her mother and 2 daughters in [place].  She is a Press photographer.  The patient returns for follow-up after 4 months regarding her depression and anxiety.  Again she is trying to get off the Suboxone.  Last time she was seen in the emergency room a couple of times for hypertension and hyperhidrosis.  Fortunately her EKG was normal.  She states that she only had a "little piece" of Suboxone 2 days ago and she is planning to come off of it.  She states that she was off it for several weeks but the Suboxone clinic urged her to go back to it.  She really wants to come off of it so I will prescribe clonidine and Zofran in case she has withdrawal symptoms.  In terms of mood she states she has been doing fairly well.  She denies  significant depression anxiety or thoughts of self-harm.  However she is anxious about coming off the Suboxone.  She does think her antidepressant is working well for her. Visit Diagnosis:    ICD-10-CM   1. PTSD (post-traumatic stress disorder)  F43.10 ALPRAZolam (XANAX) 1 MG tablet      Past Psychiatric History: Treatment at [facility] in the past.  She had 1 hospitalization about 7 years ago for depression with suicidal ideation  Past Medical History:  Past Medical History:  Diagnosis Date   Anxiety    Asthma    WELL CONTROLLED   Asthma    Depression    pt stated   Eczema 2008   Gallstones    GERD (gastroesophageal reflux disease)    Headache    Hx MRSA infection 09-2014   IBS (irritable bowel syndrome)    Ovarian cyst     Past Surgical History:  Procedure Laterality Date   CHOLECYSTECTOMY N/A 01/16/2016   Procedure: LAPAROSCOPIC CHOLECYSTECTOMY;  Surgeon: Lattie Haw, MD;  Location: [facility] ORS;  Service: General;  Laterality: N/A;   ESOPHAGOGASTRODUODENOSCOPY N/A 11/13/2015   NFA:OZHYQMV reflux    OVARIAN CYST REMOVAL     OVARIAN CYST REMOVAL  2011   TUBAL LIGATION     WISDOM TOOTH EXTRACTION      Family Psychiatric History: See below  Family History:  Family History  Problem Relation Age of Onset   Migraines Mother    Diabetes  Mother    Anxiety disorder Mother    Depression Mother    Depression Maternal Grandmother    Stroke Maternal Grandfather    Dementia Paternal Grandfather    Colon cancer Neg Hx    Crohn's disease Neg Hx    Celiac disease Neg Hx     Social History:  Social History   Socioeconomic History   Marital status: Legally Separated    Spouse name: Not on file   Number of children: 2   Years of education: Not on file   Highest education level: Not on file  Occupational History   Not on file  Tobacco Use   Smoking status: Every Day    Current packs/day: 0.75    Average packs/day: 0.8 packs/day for 17.4 years (13.0 ttl pk-yrs)    Types:  Cigarettes    Start date: 02/11/2006   Smokeless tobacco: Never  Vaping Use   Vaping status: Former  Substance and Sexual Activity   Alcohol use: Yes    Alcohol/week: 0.0 standard drinks of alcohol    Comment: rarely, maybe once a year   Drug use: Not Currently    Types: Benzodiazepines, Opium   Sexual activity: Yes    Partners: Male    Birth control/protection: Surgical  Other Topics Concern   Not on file  Social History Narrative   ** Merged History Encounter **    Four daughters between herself and husband, two Human resources officer.    Social Determinants of Health   Financial Resource Strain: Not on file  Food Insecurity: Not on file  Transportation Needs: Not on file  Physical Activity: Not on file  Stress: Not on file  Social Connections: Not on file    Allergies:  Allergies  Allergen Reactions   Aloe Swelling   Latex Dermatitis    Metabolic Disorder Labs: Lab Results  Component Value Date   HGBA1C 5.7 (H) 05/13/2023   No results found for: "PROLACTIN" Lab Results  Component Value Date   CHOL 169 05/13/2023   TRIG 130 05/13/2023   HDL 33 (L) 05/13/2023   CHOLHDL 5.1 (H) 05/13/2023   LDLCALC 112 (H) 05/13/2023   LDLCALC 100 (H) 11/12/2022   Lab Results  Component Value Date   TSH 1.450 05/13/2023   TSH 0.658 04/22/2023    Therapeutic Level Labs: No results found for: "LITHIUM" No results found for: "VALPROATE" No results found for: "CBMZ"  Current Medications: Current Outpatient Medications  Medication Sig Dispense Refill   cloNIDine (CATAPRES) 0.1 MG tablet Take 1 tablet (0.1 mg total) by mouth 2 (two) times daily as needed. 60 tablet 2   ondansetron (ZOFRAN) 4 MG tablet Take 1 tablet (4 mg total) by mouth daily as needed for nausea or vomiting. 30 tablet 1   ALPRAZolam (XANAX) 1 MG tablet Take 1 tablet (1 mg total) by mouth daily as needed for anxiety. 30 tablet 2   budesonide-formoterol (SYMBICORT) 80-4.5 MCG/ACT inhaler Inhale 2 puffs into the lungs  2 (two) times daily. 1 each 3   cephALEXin (KEFLEX) 500 MG capsule Take 1 capsule (500 mg total) by mouth 3 (three) times daily. 21 capsule 0   cetirizine (ZYRTEC) 10 MG tablet Take 10 mg by mouth daily as needed.     montelukast (SINGULAIR) 10 MG tablet Take 1 tablet (10 mg total) by mouth daily. 30 tablet 2   nystatin cream (MYCOSTATIN) Apply topically 2 (two) times daily.     nystatin-triamcinolone ointment (MYCOLOG) Apply 1 Application topically 2 (two) times  daily. 30 g 0   ondansetron (ZOFRAN-ODT) 4 MG disintegrating tablet Take 1 tablet (4 mg total) by mouth every 8 (eight) hours as needed. 10 tablet 0   pantoprazole (PROTONIX) 40 MG tablet Take 1 tablet (40 mg total) by mouth daily. 30 tablet 1   phenazopyridine (PYRIDIUM) 200 MG tablet Take 1 tablet (200 mg total) by mouth 3 (three) times daily. 6 tablet 0   polyethylene glycol (MIRALAX / GLYCOLAX) 17 g packet Take 17 g by mouth daily. 14 each 0   VENTOLIN HFA 108 (90 Base) MCG/ACT inhaler INHALE TWO PUFFS INTO LUNGS FOUR TIMES DAILY 18 g 0   Vitamin D, Ergocalciferol, (DRISDOL) 1.25 MG (50000 UNIT) CAPS capsule Take 1 capsule (50,000 Units total) by mouth every 7 (seven) days. 20 capsule 1   vortioxetine HBr (TRINTELLIX) 10 MG TABS tablet Take 1 tablet (10 mg total) by mouth daily. 30 tablet 2   No current facility-administered medications for this visit.     Musculoskeletal: Strength & Muscle Tone: within normal limits Gait & Station: normal Patient leans: N/A  Psychiatric Specialty Exam: Review of Systems  Psychiatric/Behavioral:  The patient is nervous/anxious.   All other systems reviewed and are negative.   Last menstrual period 05/20/2023.There is no height or weight on file to calculate BMI.  General Appearance: Casual and Fairly Groomed  Eye Contact:  Good  Speech:  Clear and Coherent  Volume:  Normal  Mood:  Anxious and Euthymic  Affect:  Congruent  Thought Process:  Goal Directed  Orientation:  Full (Time,  Place, and Person)  Thought Content: Rumination   Suicidal Thoughts:  No  Homicidal Thoughts:  No  Memory:  Immediate;   Good Recent;   Good Remote;   NA  Judgement:  Fair  Insight:  Fair  Psychomotor Activity:  Normal  Concentration:  Concentration: Good and Attention Span: Good  Recall:  Good  Fund of Knowledge: Good  Language: Good  Akathisia:  No  Handed:  Right  AIMS (if indicated): not done  Assets:  Communication Skills Desire for Improvement Physical Health Resilience Social Support Talents/Skills  ADL's:  Intact  Cognition: WNL  Sleep:  Good   Screenings: AIMS    Flowsheet Row [facility]  AIMS Total Score 0      AUDIT    Flowsheet Row [facility]  Alcohol Use Disorder Identification Test Final Score (AUDIT) 2      GAD-7    Flowsheet Row Office Visit from 06/03/2023 in [facility] Office Visit from 05/03/2023 in [facility] Office Visit from 02/22/2023 in [facility] Care Office Visit from 11/05/2022 in [facility] Primary Care Counselor from 04/21/2022 in [facility] [place]  Total GAD-7 Score 0 0 15 11 3       PHQ2-9    Flowsheet Row Office Visit from 06/03/2023 in [facility] Primary Care Office Visit from 05/03/2023 in [facility] Primary Care Office Visit from 02/22/2023 in [facility] Primary Care Office Visit from 11/05/2022 in [facility] Primary Care Office Visit from 08/05/2022 in [facility] Primary Care  PHQ-2 Total Score 0 0 3 3 2   PHQ-9 Total Score 0 0 12 15 7       Flowsheet Row ED from 06/06/2023 in [facility] at [facility]  ED from 04/22/2023 in [facility] Emergency Department at [facility] ED from  04/21/2023 in [facility] Emergency Department at [facility]  C-SSRS RISK CATEGORY No Risk No Risk No Risk        Assessment and Plan: This patient is a 32 year old female with a history of depression and anxiety.  She claims that years back she was taking too many narcotic pills from her brother and she got on Suboxone.  She really wants to get off it and is barely using it now.  Therefore we will prescribe clonidine 0.1 mg twice daily for withdrawal symptoms as well as Zofran 4 mg daily as needed for nausea.  She can continue the Trintellix 10 mg daily for depression and Xanax 1 mg daily as needed for anxiety.  She will return to see me in 4 weeks  Collaboration of Care: Collaboration of Care: Referral or follow-up with counselor/therapist AEB patient will continue therapy with [name] in our office  Patient/Guardian was advised Release of Information must be obtained prior to any record release in order to collaborate their care with an outside provider. Patient/Guardian was advised if they have not already done so to contact the registration department to sign all necessary forms in order for [place] to release information regarding their care.   Consent: Patient/Guardian gives verbal consent for treatment and assignment of benefits for services provided during this visit. Patient/Guardian expressed understanding and agreed to proceed.    [name], MD 07/07/2023, 9:25 AM

## 2023-07-11 NOTE — Progress Notes (Unsigned)
NEUROLOGY FOLLOW UP OFFICE NOTE  Karen Shields [id number]  Assessment/Plan:   Ocular migraines Numbness/tingling in calves Hyperhidrosis    1  Given paresthesias and hyperhidrosis, will order punch skin biopsy to evaluate for small fiber neuropathy.  Further recommendations pending results. 2  Follow up 6 months.     Subjective:  Karen Shields is a 32 year old right-female with asthma, depression, anxiety and IBS who follows up for cerebral aneurysm.    UPDATE: Ocular migraines stable.  She has muscle spasm in the calves for several months.  It occurs at night in bed or it may occur bearing weight while walking down stairs.  Potassium level is fine.  Takes Flexeril with minimal benefit.  No back pain and no radicular pain down the legs.  No weakness but feels like her legs will give out.  Also notes feeling of numbness in the calves when it cramps.  NCV-EMG of bilateral legs, personally reviewed, was normal.  Labs revealed B12 829, TSH 1.450, free T4 1.11, and vit D 29.3.  Started on weekly supplement by her PCP.  Symptoms have improved but not full resolve.  May feel the the numbness every now and then but definitely occurs when she is squatting.  Losing balance walking down the stairs is only occasional now as well.  In Augst she woke up in bed with numbness in the left arm. Symptoms lasted about 3 months.  She also felt anxious.  She went to the ED and was given Valium which helped.  She also reports excess sweating in the palms of her hands and her feet.  If she is in the shower and washes her head, she feels lightheaded.  She has hot flashes.   HISTORY: On 12/04/2021, she was at work when she developed sudden onset blurred and foggy vision in her left eye that quickly progressed to being unable to see at all out of her left eye.  Symptoms lasted 25-30 minutes.  No associated eye pain, headache, facial droop, or unilateral numbness or weakness.  She had a similar event about 3  months prior.  She went to the Urgent Care and was advised to go to the ED.  She didn't go right away but then had another episode but this time occurring in the right eye and only lasted 5-10 minutes.  On 12/17/2021 to go to the ED.  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  She saw ophthalmology the next day with normal exam. She reports a remote history of migraine headaches.  CTA of head on 01/14/2022 was normal, indicating that the previously seen outpouching arising from the left cavernous ICA was artifactual.    PAST MEDICAL HISTORY: Past Medical History:  Diagnosis Date   Anxiety    Asthma    WELL CONTROLLED   Asthma    Depression    pt stated   Eczema 2008   Gallstones    GERD (gastroesophageal reflux disease)    Headache    Hx MRSA infection 09-2014   IBS (irritable bowel syndrome)    Ovarian cyst     MEDICATIONS: Current Outpatient Medications on File Prior to Visit  Medication Sig Dispense Refill   ALPRAZolam (XANAX) 1 MG tablet Take 1 tablet (1 mg total) by mouth daily as needed for anxiety. 30  tablet 2   budesonide-formoterol (SYMBICORT) 80-4.5 MCG/ACT inhaler Inhale 2 puffs into the lungs 2 (two) times daily. 1 each 3   cephALEXin (KEFLEX) 500 MG capsule Take 1 capsule (500 mg total) by mouth 3 (three) times daily. 21 capsule 0   cetirizine (ZYRTEC) 10 MG tablet Take 10 mg by mouth daily as needed.     cloNIDine (CATAPRES) 0.1 MG tablet Take 1 tablet (0.1 mg total) by mouth 2 (two) times daily as needed. 60 tablet 2   montelukast (SINGULAIR) 10 MG tablet Take 1 tablet (10 mg total) by mouth daily. 30 tablet 2   nystatin cream (MYCOSTATIN) Apply topically 2 (two) times daily.     nystatin-triamcinolone ointment (MYCOLOG) Apply 1 Application  topically 2 (two) times daily. 30 g 0   ondansetron (ZOFRAN) 4 MG tablet Take 1 tablet (4 mg total) by mouth daily as needed for nausea or vomiting. 30 tablet 1   ondansetron (ZOFRAN-ODT) 4 MG disintegrating tablet Take 1 tablet (4 mg total) by mouth every 8 (eight) hours as needed. 10 tablet 0   pantoprazole (PROTONIX) 40 MG tablet Take 1 tablet (40 mg total) by mouth daily. 30 tablet 1   phenazopyridine (PYRIDIUM) 200 MG tablet Take 1 tablet (200 mg total) by mouth 3 (three) times daily. 6 tablet 0   polyethylene glycol (MIRALAX / GLYCOLAX) 17 g packet Take 17 g by mouth daily. 14 each 0   VENTOLIN HFA 108 (90 Base) MCG/ACT inhaler INHALE TWO PUFFS INTO LUNGS FOUR TIMES DAILY 18 g 0   Vitamin D, Ergocalciferol, (DRISDOL) 1.25 MG (50000 UNIT) CAPS capsule Take 1 capsule (50,000 Units total) by mouth every 7 (seven) days. 20 capsule 1   vortioxetine HBr (TRINTELLIX) 10 MG TABS tablet Take 1 tablet (10 mg total) by mouth daily. 30 tablet 2   No current facility-administered medications on file prior to visit.    ALLERGIES: Allergies  Allergen Reactions   Aloe Swelling   Latex Dermatitis    FAMILY HISTORY: Family History  Problem Relation Age of Onset   Migraines Mother    Diabetes Mother    Anxiety disorder Mother    Depression Mother    Depression Maternal Grandmother    Stroke Maternal Grandfather    Dementia Paternal Grandfather    Colon cancer Neg Hx    Crohn's disease Neg Hx    Celiac disease Neg Hx       Objective:  Blood pressure 133/73, pulse 76, height 5\' 4"  (1.626 m), weight 180 lb (81.6 kg), SpO2 98%. General: No acute distress.  Patient appears well-groomed.   Head:  Normocephalic/atraumatic Eyes:  Fundi examined but not visualized Neck: supple, no paraspinal tenderness, full range of motion Heart:  Regular rate and rhythm Neurological Exam: alert and oriented.  Speech fluent and not dysarthric, language intact.  CN II-XII intact. Bulk and tone normal, muscle  strength 5/5 throughout.  Sensation to pinprick and vibration intact.  Deep tendon reflexes 2+ throughout.  Finger to nose testing intact.  Gait normal, Romberg negative.   Shon Millet, DO  CC: Gilmore Laroche, FNP

## 2023-07-12 ENCOUNTER — Encounter: Payer: Self-pay | Admitting: Neurology

## 2023-07-12 ENCOUNTER — Ambulatory Visit (INDEPENDENT_AMBULATORY_CARE_PROVIDER_SITE_OTHER): Payer: Medicaid Other | Admitting: Neurology

## 2023-07-12 VITALS — BP 133/73 | HR 76 | Ht 64.0 in | Wt 180.0 lb

## 2023-07-12 DIAGNOSIS — G43109 Migraine with aura, not intractable, without status migrainosus: Secondary | ICD-10-CM

## 2023-07-12 DIAGNOSIS — R61 Generalized hyperhidrosis: Secondary | ICD-10-CM | POA: Diagnosis not present

## 2023-07-12 DIAGNOSIS — R202 Paresthesia of skin: Secondary | ICD-10-CM

## 2023-07-12 DIAGNOSIS — F320 Major depressive disorder, single episode, mild: Secondary | ICD-10-CM | POA: Diagnosis not present

## 2023-07-12 NOTE — Patient Instructions (Signed)
Punch skin biopsy with Dr. Loleta Chance

## 2023-07-26 DIAGNOSIS — J22 Unspecified acute lower respiratory infection: Secondary | ICD-10-CM | POA: Diagnosis not present

## 2023-07-26 DIAGNOSIS — R051 Acute cough: Secondary | ICD-10-CM | POA: Diagnosis not present

## 2023-08-02 ENCOUNTER — Ambulatory Visit (INDEPENDENT_AMBULATORY_CARE_PROVIDER_SITE_OTHER): Payer: Medicaid Other | Admitting: Neurology

## 2023-08-02 DIAGNOSIS — F320 Major depressive disorder, single episode, mild: Secondary | ICD-10-CM | POA: Diagnosis not present

## 2023-08-02 DIAGNOSIS — F411 Generalized anxiety disorder: Secondary | ICD-10-CM | POA: Diagnosis not present

## 2023-08-02 DIAGNOSIS — R209 Unspecified disturbances of skin sensation: Secondary | ICD-10-CM | POA: Diagnosis not present

## 2023-08-02 DIAGNOSIS — R202 Paresthesia of skin: Secondary | ICD-10-CM | POA: Diagnosis not present

## 2023-08-02 DIAGNOSIS — F4312 Post-traumatic stress disorder, chronic: Secondary | ICD-10-CM | POA: Diagnosis not present

## 2023-08-02 NOTE — Progress Notes (Signed)
Punch Biopsy Procedure Note  Preprocedure Diagnosis: disturbance of skin sensation   Postprocedure Diagnosis: same  Locations: Site 1: right lateral distal leg;  Site 2: right lateral thigh;   Indications: r/o small fiber neuropathy  Anesthesia: 5 mL Lidocaine 1% with epinephrine  Procedure Details Patient informed of the risks (including but not limited to bleeding, pain, infection, scar and infection) and benefits of the procedure.  Informed consent obtained.  The areas which were chosen for biopsy, as above, and surrounding areas were given a sterile prep using alcohol and iodine. The skin was then stretched perpendicular to the skin tension lines and sample removed using the 3 mm punch. Pressure applied, hemostasis achieved.   Dressing applied. The specimen(s) was sent for pathologic examination. The patient tolerated the procedure well.  Estimated Blood Loss: 1 ml  Condition: Stable  Complications: none.  Plan: 1. Instructed to keep the wound dry and covered for 24h and clean thereafter. 2. Warning signs of infection were reviewed.    Kai Levins, MD [facility]

## 2023-08-12 ENCOUNTER — Encounter: Payer: Self-pay | Admitting: Family Medicine

## 2023-08-12 ENCOUNTER — Ambulatory Visit (INDEPENDENT_AMBULATORY_CARE_PROVIDER_SITE_OTHER): Payer: Medicaid Other | Admitting: Family Medicine

## 2023-08-12 VITALS — BP 125/86 | HR 93 | Ht 64.0 in | Wt 174.1 lb

## 2023-08-12 DIAGNOSIS — F5102 Adjustment insomnia: Secondary | ICD-10-CM | POA: Diagnosis not present

## 2023-08-12 DIAGNOSIS — N390 Urinary tract infection, site not specified: Secondary | ICD-10-CM | POA: Diagnosis not present

## 2023-08-12 DIAGNOSIS — G4700 Insomnia, unspecified: Secondary | ICD-10-CM | POA: Insufficient documentation

## 2023-08-12 MED ORDER — GABAPENTIN 300 MG PO CAPS: 300.0000 mg | ORAL_CAPSULE | Freq: Every evening | ORAL | 3 refills | Status: AC | PRN

## 2023-08-12 MED ORDER — ONDANSETRON 4 MG PO TBDP: 4.0000 mg | ORAL_TABLET | Freq: Three times a day (TID) | ORAL | 0 refills | Status: DC | PRN

## 2023-08-12 MED ORDER — SULFAMETHOXAZOLE-TRIMETHOPRIM 800-160 MG PO TABS
1.0000 | ORAL_TABLET | Freq: Two times a day (BID) | ORAL | 0 refills | Status: AC
Start: 2023-08-12 — End: 2023-08-17

## 2023-08-12 NOTE — Assessment & Plan Note (Signed)
Bactrim 800-160 mg twice daily x 5 days Urinalysis, NuSwab and urine culture ordered- Awaiting results will follow up. May take OTC AZO for urinary pain relief. Discussed managing a UTI at home, it's important to drink plenty of water to help flush out bacteria from your urinary tract. Make sure to urinate frequently and avoid holding your urine. After using the bathroom, always wipe from front to back to prevent bacteria from spreading. Avoid irritants like caffeine, alcohol, spicy foods, and artificial sweeteners, as they can aggravate your bladder. Wearing loose, breathable clothing, especially cotton underwear, can help keep the area dry and reduce bacterial growth. If symptoms persist or worsen follow up.

## 2023-08-12 NOTE — Patient Instructions (Signed)

## 2023-08-12 NOTE — Progress Notes (Signed)
Established Patient Office Visit   Subjective  Patient ID: Karen Shields, female    DOB: [birth year]  Age: 32 y.o. MRN: [id number]  Chief Complaint  Patient presents with   Acute Visit    UTI pain w/ urination since Wednesday Not sleeping.     She  has a past medical history of Anxiety, Asthma, Asthma, Depression, Eczema (2008), Gallstones, GERD (gastroesophageal reflux disease), Headache, MRSA infection (09-2014), IBS (irritable bowel syndrome), and Ovarian cyst.   The patient reports burning pain during urination, rated as 9/10 in severity, with symptoms gradually worsening over time started on Wednesday. She denies fever but notes associated symptoms of chills, pelvic pain, and white discharge. Pertinent negatives include no flank pain or hematuria. The patient has a history of pyelonephritis and recurrent UTIs but no history of kidney stones. She has attempted to increase fluid intake without symptom relief.  Insomnia: Patient reports fragmented and disturbed sleep occurring nightly, with symptoms progressively worsening. Aggravating factors include anxiety and family stress. She consumes 2-3 caffeinated sodas daily. Symptoms are partially relieved by a darkened room or keeping the TV on. Her typical bedtime is between 10 and 11 PM, and it takes her 15-30 minutes to fall asleep. There is no prior diagnostic workup for her sleep issues, but her medical history includes anxiety and family-related stress.     Review of Systems  Constitutional:  Negative for chills and fever.  Eyes:  Negative for blurred vision.  Respiratory:  Negative for shortness of breath.   Cardiovascular:  Negative for chest pain.  Genitourinary:  Positive for dysuria. Negative for flank pain and hematuria.       Pelvic pain      Objective:     BP 125/86   Pulse 93   Ht 5\' 4"  (1.626 m)   Wt 174 lb 1.9 oz (79 kg)   LMP 07/24/2023   SpO2 95%   BMI 29.89 kg/m  BP Readings from Last 3 Encounters:   08/12/23 125/86  07/12/23 133/73  06/07/23 (!) 143/96      Physical Exam Vitals reviewed.  Constitutional:      General: She is not in acute distress.    Appearance: Normal appearance. She is not ill-appearing, toxic-appearing or diaphoretic.  HENT:     Head: Normocephalic.  Eyes:     General:        Right eye: No discharge.        Left eye: No discharge.     Conjunctiva/sclera: Conjunctivae normal.  Cardiovascular:     Rate and Rhythm: Normal rate.     Pulses: Normal pulses.     Heart sounds: Normal heart sounds.  Pulmonary:     Effort: Pulmonary effort is normal. No respiratory distress.     Breath sounds: Normal breath sounds.  Abdominal:     General: Bowel sounds are normal.     Palpations: Abdomen is soft.     Tenderness: There is abdominal tenderness.  Skin:    General: Skin is warm and dry.     Capillary Refill: Capillary refill takes less than 2 seconds.  Neurological:     Mental Status: She is alert.  Psychiatric:        Mood and Affect: Mood normal.        Behavior: Behavior normal.      No results found for any visits on 08/12/23.  The ASCVD Risk score (Arnett DK, et al., 2019) failed to calculate for the following reasons:  The 2019 ASCVD risk score is only valid for ages 79 to 72    Assessment & Plan:  Urinary tract infection without hematuria, site unspecified Assessment & Plan: Bactrim 800-160 mg twice daily x 5 days Urinalysis, NuSwab and urine culture ordered- Awaiting results will follow up. May take OTC AZO for urinary pain relief. Discussed managing a UTI at home, it's important to drink plenty of water to help flush out bacteria from your urinary tract. Make sure to urinate frequently and avoid holding your urine. After using the bathroom, always wipe from front to back to prevent bacteria from spreading. Avoid irritants like caffeine, alcohol, spicy foods, and artificial sweeteners, as they can aggravate your bladder. Wearing loose,  breathable clothing, especially cotton underwear, can help keep the area dry and reduce bacterial growth. If symptoms persist or worsen follow up.   Orders: -     Urinalysis -     Urine Culture -     NuSwab Vaginitis Plus (VG+) -     Sulfamethoxazole-Trimethoprim; Take 1 tablet by mouth 2 (two) times daily for 5 days.  Dispense: 10 tablet; Refill: 0 -     Pregnancy, urine  Adjustment insomnia Assessment & Plan: Trial on Gabapentin 300 mg bedtime PRN Explained to go to bed at the same time each night and get up at the same time each morning, including on the weekends. Make sure your bedroom is quiet, dark, relaxing, and at a comfortable temperature. Remove electronic devices, such as TVs, computers, and smart phones, from the bedroom.    Other orders -     Ondansetron; Take 1 tablet (4 mg total) by mouth every 8 (eight) hours as needed.  Dispense: 10 tablet; Refill: 0 -     Gabapentin; Take 1 capsule (300 mg total) by mouth at bedtime as needed.  Dispense: 90 capsule; Refill: 3    Return if symptoms worsen or fail to improve.   Cruzita Lederer Newman Nip, FNP

## 2023-08-12 NOTE — Assessment & Plan Note (Signed)
Trial on Gabapentin 300 mg bedtime PRN Explained to go to bed at the same time each night and get up at the same time each morning, including on the weekends. Make sure your bedroom is quiet, dark, relaxing, and at a comfortable temperature. Remove electronic devices, such as TVs, computers, and smart phones, from the bedroom.

## 2023-08-15 ENCOUNTER — Other Ambulatory Visit: Payer: Self-pay | Admitting: Family Medicine

## 2023-08-15 LAB — NUSWAB VAGINITIS PLUS (VG+)
Atopobium vaginae: HIGH {score} — AB
Candida albicans, NAA: NEGATIVE
Candida glabrata, NAA: NEGATIVE
Chlamydia trachomatis, NAA: NEGATIVE
Megasphaera 1: HIGH {score} — AB
Neisseria gonorrhoeae, NAA: NEGATIVE
Trich vag by NAA: NEGATIVE

## 2023-08-15 LAB — URINALYSIS
Bilirubin, UA: NEGATIVE
Glucose, UA: NEGATIVE
Nitrite, UA: POSITIVE — AB
Specific Gravity, UA: 1.02 (ref 1.005–1.030)
Urobilinogen, Ur: 0.2 mg/dL (ref 0.2–1.0)
pH, UA: 6 (ref 5.0–7.5)

## 2023-08-15 LAB — PREGNANCY, URINE: Preg Test, Ur: NEGATIVE

## 2023-08-15 MED ORDER — FLUCONAZOLE 150 MG PO TABS: 150.0000 mg | ORAL_TABLET | Freq: Once | ORAL | 0 refills | Status: AC

## 2023-08-15 MED ORDER — METRONIDAZOLE 0.75 % VA GEL: 1.0000 | Freq: Every day | VAGINAL | 0 refills | Status: AC

## 2023-08-16 LAB — URINE CULTURE

## 2023-08-17 ENCOUNTER — Telehealth: Payer: Self-pay | Admitting: Neurology

## 2023-08-17 ENCOUNTER — Telehealth: Payer: Self-pay

## 2023-08-17 NOTE — Telephone Encounter (Signed)
Received results of the punch skin biopsy.  It is normal.  No evidence of neuropathy.  I don't have an explanation for the numbness/tingling and excessive sweating.

## 2023-08-17 NOTE — Telephone Encounter (Signed)
Copied from CRM [id number]. Topic: Clinical - Medication Question >> Aug 16, 2023  3:57 PM Fuller Mandril wrote: Reason for CRM: Pt called stated she received call from pharmacy that two new Rx had been ordered but she did not receive a call from clinic about order. She wanted to know why the new  Rxs were ordered and if she needs to fill those and continue current Rx or discontinue current Rx. Call back [phone] (M)

## 2023-08-17 NOTE — Telephone Encounter (Signed)
I explained results on my chart, please call patient with results information on my chart,

## 2023-08-17 NOTE — Telephone Encounter (Signed)
Patient advised of her Results.

## 2023-09-16 ENCOUNTER — Ambulatory Visit (INDEPENDENT_AMBULATORY_CARE_PROVIDER_SITE_OTHER): Payer: Medicaid Other | Admitting: Psychiatry

## 2023-09-16 DIAGNOSIS — F4310 Post-traumatic stress disorder, unspecified: Secondary | ICD-10-CM

## 2023-09-16 DIAGNOSIS — F321 Major depressive disorder, single episode, moderate: Secondary | ICD-10-CM
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
## 2023-09-20 ENCOUNTER — Other Ambulatory Visit: Payer: Self-pay | Admitting: Family Medicine

## 2023-09-20 ENCOUNTER — Other Ambulatory Visit: Payer: Self-pay

## 2023-09-20 MED ORDER — ONDANSETRON 4 MG PO TBDP: 4.0000 mg | ORAL_TABLET | Freq: Three times a day (TID) | ORAL | 0 refills | Status: DC | PRN

## 2023-09-20 NOTE — Telephone Encounter (Signed)
 Copied from CRM [id number]. Topic: Clinical - Medication Refill >> Sep 20, 2023  3:38 PM Elle L wrote: Most Recent Primary Care Visit:  Provider: TERRY WILHELMENA LLOYD HILARIO  Department: [facility]  Visit Type: OFFICE VISIT  Date: 08/12/2023  Medication: ondansetron  (ZOFRAN -ODT) 4 MG disintegrating tablet   Has the patient contacted their pharmacy? Yes, they s  Is this the correct pharmacy for this prescription? Yes If no, delete pharmacy and type the correct one.  This is the patient's preferred pharmacy:  [facility] - [place], [place] - [address] [place] [address] Phone: [phone] Fax: [phone]   Has the prescription been filled recently? Yes  Is the patient out of the medication? Yes  Has the patient been seen for an appointment in the last year OR does the patient have an upcoming appointment? Yes  Can we respond through MyChart? Yes  Agent: Please be advised that Rx refills may take up to 3 business days. We ask that you follow-up with your pharmacy.

## 2023-09-29 DIAGNOSIS — R319 Hematuria, unspecified: Secondary | ICD-10-CM | POA: Diagnosis not present

## 2023-09-29 DIAGNOSIS — N898 Other specified noninflammatory disorders of vagina: Secondary | ICD-10-CM | POA: Diagnosis not present

## 2023-10-05 DIAGNOSIS — F411 Generalized anxiety disorder: Secondary | ICD-10-CM | POA: Diagnosis not present

## 2023-10-05 DIAGNOSIS — F4312 Post-traumatic stress disorder, chronic: Secondary | ICD-10-CM | POA: Diagnosis not present

## 2023-10-05 DIAGNOSIS — F320 Major depressive disorder, single episode, mild: Secondary | ICD-10-CM | POA: Diagnosis not present

## 2023-10-17 ENCOUNTER — Other Ambulatory Visit (HOSPITAL_COMMUNITY): Payer: Self-pay | Admitting: Psychiatry

## 2023-10-17 DIAGNOSIS — F4310 Post-traumatic stress disorder, unspecified: Secondary | ICD-10-CM

## 2023-10-17 MED ORDER — ALPRAZOLAM 1 MG PO TABS: 1.0000 mg | ORAL_TABLET | Freq: Every day | ORAL | 0 refills | Status: DC | PRN

## 2023-10-17 NOTE — Telephone Encounter (Signed)
Spoke with pt advised rx has been sent she verbalized understanding

## 2023-10-17 NOTE — Telephone Encounter (Signed)
sent

## 2023-10-17 NOTE — Telephone Encounter (Signed)
Pt called in scheduled appt for 10/24/23. Needs refill on this medication. Please advise.

## 2023-10-18 ENCOUNTER — Ambulatory Visit (INDEPENDENT_AMBULATORY_CARE_PROVIDER_SITE_OTHER): Payer: Medicaid Other | Admitting: Psychiatry

## 2023-10-18 DIAGNOSIS — F321 Major depressive disorder, single episode, moderate: Secondary | ICD-10-CM | POA: Diagnosis not present

## 2023-10-18 DIAGNOSIS — F4310 Post-traumatic stress disorder, unspecified: Secondary | ICD-10-CM | POA: Diagnosis not present
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
## 2023-10-24 ENCOUNTER — Telehealth (INDEPENDENT_AMBULATORY_CARE_PROVIDER_SITE_OTHER): Payer: Medicaid Other | Admitting: Psychiatry

## 2023-10-24 ENCOUNTER — Encounter (HOSPITAL_COMMUNITY): Payer: Self-pay | Admitting: Psychiatry

## 2023-10-24 DIAGNOSIS — F4310 Post-traumatic stress disorder, unspecified: Secondary | ICD-10-CM | POA: Diagnosis not present

## 2023-10-24 MED ORDER — VORTIOXETINE HBR 20 MG PO TABS: 20.0000 mg | ORAL_TABLET | Freq: Every day | ORAL | 2 refills | Status: DC

## 2023-10-24 MED ORDER — ALPRAZOLAM 1 MG PO TABS: 1.0000 mg | ORAL_TABLET | Freq: Every day | ORAL | 0 refills | Status: DC | PRN

## 2023-10-24 NOTE — Progress Notes (Signed)
Virtual Visit via Video Note  I connected with Karen Shields on 10/24/23 at  1:20 PM EST by a video enabled telemedicine application and verified that I am speaking with the correct person using two identifiers.  Location: Patient: home Provider: office   I discussed the limitations of evaluation and management by telemedicine and the availability of in person appointments. The patient expressed understanding and agreed to proceed.     I discussed the assessment and treatment plan with the patient. The patient was provided an opportunity to ask questions and all were answered. The patient agreed with the plan and demonstrated an understanding of the instructions.   The patient was advised to call back or seek an in-person evaluation if the symptoms worsen or if the condition fails to improve as anticipated.  I provided 20 minutes of non-face-to-face time during this encounter.   [name], MD  [facility] MD/PA/NP OP Progress Note  10/24/2023 1:42 PM Karen Shields  MRN:  [id number]  Chief Complaint:  Chief Complaint  Patient presents with   Depression   Anxiety   Follow-up   HPI: This patient is a 33 year old separated white female who lives with her 2 daughters in [place].  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  The patient returns for follow-up after 4 months regarding her depression and anxiety.  She states that she has been through a lot with her ex-husband.  She states that he has a new girlfriend and they filed a restraining order against her for "no good reason."  She then got picked up by the police for violating a restraining order even though she had not done anything.  She spent 1 day in jail.  When she got to court regarding this her ex-husband dropped the charges.  He also is not helping her with child support.  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  She is trying to find something else.  She  has been very stressed.  The patient states that she rarely uses a Suboxone that she has been prescribed.  She is picked it up twice in the last 3 months.  She does think the higher dose of Trintellix-20 mg worked better for her particularly because she is under so much stress.  She is generally sleeping okay and the Xanax helps with this player. Visit Diagnosis:    ICD-10-CM   1. PTSD (post-traumatic stress disorder)  F43.10 vortioxetine HBr (TRINTELLIX) 20 MG TABS tablet    ALPRAZolam (XANAX) 1 MG tablet      Past Psychiatric History: Treatment at [facility] in the past.  She had 1 hospitalization about 8 years ago for depression with suicidal ideation  Past Medical History:  Past Medical History:  Diagnosis Date   Anxiety    Asthma    WELL CONTROLLED   Asthma    Depression    pt stated   Eczema 2008   Gallstones    GERD (gastroesophageal reflux disease)    Headache    Hx MRSA infection 09-2014   IBS (irritable bowel syndrome)    Ovarian cyst     Past Surgical History:  Procedure Laterality Date   CHOLECYSTECTOMY N/A 01/16/2016   Procedure: LAPAROSCOPIC CHOLECYSTECTOMY;  Surgeon: Lattie Haw, MD;  Location: [facility] ORS;  Service: General;  Laterality: N/A;   ESOPHAGOGASTRODUODENOSCOPY N/A 11/13/2015   WUJ:[id number] reflux    OVARIAN CYST REMOVAL     OVARIAN CYST REMOVAL  2011  TUBAL LIGATION     WISDOM TOOTH EXTRACTION      Family Psychiatric History: See below  Family History:  Family History  Problem Relation Age of Onset   Migraines Mother    Diabetes Mother    Anxiety disorder Mother    Depression Mother    Depression Maternal Grandmother    Stroke Maternal Grandfather    Dementia Paternal Grandfather    Colon cancer Neg Hx    Crohn's disease Neg Hx    Celiac disease Neg Hx     Social History:  Social History   Socioeconomic History   Marital status: Legally Separated    Spouse name: Not on file   Number of children: 2   Years of education: Not on  file   Highest education level: Not on file  Occupational History   Not on file  Tobacco Use   Smoking status: Every Day    Current packs/day: 0.75    Average packs/day: 0.7 packs/day for 17.7 years (13.3 ttl pk-yrs)    Types: Cigarettes    Start date: 02/11/2006   Smokeless tobacco: Never  Vaping Use   Vaping status: Former  Substance and Sexual Activity   Alcohol use: Yes    Alcohol/week: 0.0 standard drinks of alcohol    Comment: rarely, maybe once a year   Drug use: Not Currently    Types: Benzodiazepines, Opium   Sexual activity: Yes    Partners: Male    Birth control/protection: Surgical  Other Topics Concern   Not on file  Social History Narrative   ** Merged History Encounter **    Four daughters between herself and husband, two Human resources officer.    Social Drivers of Corporate investment banker Strain: Not on file  Food Insecurity: Not on file  Transportation Needs: Not on file  Physical Activity: Not on file  Stress: Not on file  Social Connections: Not on file    Allergies:  Allergies  Allergen Reactions   Aloe Swelling   Latex Dermatitis    Metabolic Disorder Labs: Lab Results  Component Value Date   HGBA1C 5.7 (H) 05/13/2023   No results found for: "PROLACTIN" Lab Results  Component Value Date   CHOL 169 05/13/2023   TRIG 130 05/13/2023   HDL 33 (L) 05/13/2023   CHOLHDL 5.1 (H) 05/13/2023   LDLCALC 112 (H) 05/13/2023   LDLCALC 100 (H) 11/12/2022   Lab Results  Component Value Date   TSH 1.450 05/13/2023   TSH 0.658 04/22/2023    Therapeutic Level Labs: No results found for: "LITHIUM" No results found for: "VALPROATE" No results found for: "CBMZ"  Current Medications: Current Outpatient Medications  Medication Sig Dispense Refill   ALPRAZolam (XANAX) 1 MG tablet Take 1 tablet (1 mg total) by mouth daily as needed for anxiety. 30 tablet 0   budesonide-formoterol (SYMBICORT) 80-4.5 MCG/ACT inhaler Inhale 2 puffs into the lungs 2 (two) times  daily. 1 each 3   cetirizine (ZYRTEC) 10 MG tablet Take 10 mg by mouth daily as needed.     gabapentin (NEURONTIN) 300 MG capsule Take 1 capsule (300 mg total) by mouth at bedtime as needed. 90 capsule 3   montelukast (SINGULAIR) 10 MG tablet Take 1 tablet (10 mg total) by mouth daily. (Patient not taking: Reported on 08/12/2023) 30 tablet 2   nystatin cream (MYCOSTATIN) Apply topically 2 (two) times daily.     nystatin-triamcinolone ointment (MYCOLOG) Apply 1 Application topically 2 (two) times daily. 30 g 0  ondansetron (ZOFRAN-ODT) 4 MG disintegrating tablet Take 1 tablet (4 mg total) by mouth every 8 (eight) hours as needed. 10 tablet 0   phenazopyridine (PYRIDIUM) 200 MG tablet Take 1 tablet (200 mg total) by mouth 3 (three) times daily. (Patient not taking: Reported on 08/12/2023) 6 tablet 0   polyethylene glycol (MIRALAX / GLYCOLAX) 17 g packet Take 17 g by mouth daily. 14 each 0   SUBOXONE 8-2 MG FILM Place 8 mg of opioid under the tongue 2 (two) times daily.     VENTOLIN HFA 108 (90 Base) MCG/ACT inhaler INHALE TWO PUFFS INTO LUNGS FOUR TIMES DAILY 18 g 0   Vitamin D, Ergocalciferol, (DRISDOL) 1.25 MG (50000 UNIT) CAPS capsule Take 1 capsule (50,000 Units total) by mouth every 7 (seven) days. 20 capsule 1   vortioxetine HBr (TRINTELLIX) 10 MG TABS tablet Take 1 tablet (10 mg total) by mouth daily. (Patient taking differently: Take 20 mg by mouth daily.) 30 tablet 2   vortioxetine HBr (TRINTELLIX) 20 MG TABS tablet Take 1 tablet (20 mg total) by mouth daily. 30 tablet 2   No current facility-administered medications for this visit.     Musculoskeletal: Strength & Muscle Tone: within normal limits Gait & Station: normal Patient leans: N/A  Psychiatric Specialty Exam: Review of Systems  Psychiatric/Behavioral:  The patient is nervous/anxious.   All other systems reviewed and are negative.   There were no vitals taken for this visit.There is no height or weight on file to calculate  BMI.  General Appearance: Casual and Fairly Groomed  Eye Contact:  Good  Speech:  Clear and Coherent  Volume:  Normal  Mood:  Anxious and Euthymic  Affect:  Congruent  Thought Process:  Goal Directed  Orientation:  Full (Time, Place, and Person)  Thought Content: Rumination   Suicidal Thoughts:  No  Homicidal Thoughts:  No  Memory:  Immediate;   Good Recent;   Good Remote;   NA  Judgement:  Good  Insight:  Fair  Psychomotor Activity:  Normal  Concentration:  Concentration: Good and Attention Span: Good  Recall:  Good  Fund of Knowledge: Good  Language: Good  Akathisia:  No  Handed:  Right  AIMS (if indicated): not done  Assets:  Communication Skills Desire for Improvement Physical Health Resilience Social Support Talents/Skills  ADL's:  Intact  Cognition: WNL  Sleep:  Good   Screenings: AIMS    Flowsheet Row [facility]  AIMS Total Score 0      AUDIT    Flowsheet Row [facility]  Alcohol Use Disorder Identification Test Final Score (AUDIT) 2      GAD-7    Flowsheet Row Office Visit from 06/03/2023 in [facility] Office Visit from 05/03/2023 in [facility] Office Visit from 02/22/2023 in [facility] Office Visit from 11/05/2022 in [facility] Primary [facility] Counselor from 04/21/2022 in [facility] [place]  Total GAD-7 Score 0 0 15 11 3       PHQ2-9    Flowsheet Row Office Visit from 08/12/2023 in [facility] Primary Care Office Visit from 06/03/2023 in [facility] Primary Care Office Visit from 05/03/2023 in [facility] Care Office Visit from 02/22/2023 in [facility] Primary Care Office Visit from 11/05/2022 in [facility] Primary Care  PHQ-2  Total  Score 0 0 0 3 3  PHQ-9 Total Score -- 0 0 12 15      Flowsheet Row ED from 06/06/2023 in [facility] Emergency Department at [facility] ED from 04/22/2023 in [facility] Department at [facility] ED from 04/21/2023 in [facility] Emergency Department at [facility]  C-SSRS RISK CATEGORY No Risk No Risk No Risk        Assessment and Plan: This patient is a 33 year old female with a history of depression and anxiety.  Since she is more anxious we will increase Trintellix to 20 mg daily for depression and anxiety and continue Xanax 1 mg daily only as needed for anxiety.  She will return to see me in 3 months  Collaboration of Care: Collaboration of Care: Referral or follow-up with counselor/therapist AEB patient will continue therapy with [name] in our office  Patient/Guardian was advised Release of Information must be obtained prior to any record release in order to collaborate their care with an outside provider. Patient/Guardian was advised if they have not already done so to contact the registration department to sign all necessary forms in order for [place] to release information regarding their care.   Consent: Patient/Guardian gives verbal consent for treatment and assignment of benefits for services provided during this visit. Patient/Guardian expressed understanding and agreed to proceed.    [name], MD 10/24/2023, 1:42 PM

## 2023-11-21 ENCOUNTER — Other Ambulatory Visit: Payer: Self-pay | Admitting: Family Medicine

## 2023-11-22 DIAGNOSIS — Z1151 Encounter for screening for human papillomavirus (HPV): Secondary | ICD-10-CM | POA: Diagnosis not present

## 2023-11-22 DIAGNOSIS — Z124 Encounter for screening for malignant neoplasm of cervix: Secondary | ICD-10-CM | POA: Diagnosis not present

## 2023-11-22 DIAGNOSIS — N926 Irregular menstruation, unspecified: Secondary | ICD-10-CM | POA: Diagnosis not present

## 2023-11-29 DIAGNOSIS — N926 Irregular menstruation, unspecified: Secondary | ICD-10-CM | POA: Diagnosis not present

## 2023-12-19 ENCOUNTER — Other Ambulatory Visit (HOSPITAL_COMMUNITY): Payer: Self-pay | Admitting: Psychiatry

## 2023-12-19 DIAGNOSIS — F4310 Post-traumatic stress disorder, unspecified: Secondary | ICD-10-CM

## 2023-12-20 ENCOUNTER — Telehealth (HOSPITAL_COMMUNITY): Payer: Self-pay | Admitting: *Deleted

## 2023-12-20 NOTE — Telephone Encounter (Signed)
She needs to make an appt to discuss 

## 2023-12-20 NOTE — Telephone Encounter (Signed)
 Per pt she thinks the Trintellix is what causing her stomach to be upset  in the morning when she takes it. Per pt she tried eating with the tablet and still causing nauseous feeling. Patient would like to know what to do.   [phone]

## 2023-12-21 NOTE — Telephone Encounter (Signed)
 LMOM

## 2024-01-09 NOTE — Progress Notes (Deleted)
 NEUROLOGY FOLLOW UP OFFICE NOTE  Karen Shields [id number]  Assessment/Plan:   Ocular migraines Paresthesias/Hyperhidrosis negative neurologic workup   1  ***     Subjective:  Karen Shields is a 33 year old right-female with asthma, depression, anxiety and IBS who follows up for paresthesias and ocular migraines.  UPDATE: Ocular migraines: Stable  Muscle spasms/paresthesias and hyperhidrosis: Given paresthesias and hyperhidrosis, she had a punch skin biopsy on 08/02/2023 which was negative for small fiber neuropathy.       HISTORY: Ocular migraines: On 12/04/2021, she was at work when she developed sudden onset blurred and foggy vision in her left eye that quickly progressed to being unable to see at all out of her left eye.  Symptoms lasted 25-30 minutes.  No associated eye pain, headache, facial droop, or unilateral numbness or weakness.  She had a similar event about 3 months prior.  She went to the Urgent Care and was advised to go to the ED.  She didn't go right away but then had another episode but this time occurring in the right eye and only lasted 5-10 minutes.  On 12/17/2021 to go to the ED.  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  She saw ophthalmology the next day with normal exam. She reports a remote history of migraine headaches.  CTA of head on 01/14/2022 was normal, indicating that the previously seen outpouching arising from the left cavernous ICA was artifactual.   Paresthesias/hyperhidrosis: Since 2024, she has muscle spasm in the calves for several months.  It occurs at night in bed or it may occur bearing weight while walking down stairs.  Potassium level is fine.  Takes Flexeril  with minimal benefit.  No back pain and no radicular  pain down the legs.  No weakness but feels like her legs will give out.  Also notes feeling of numbness in the calves when it cramps.  NCV-EMG of bilateral legs, personally reviewed, was normal.  Labs revealed B12 829, TSH 1.450, free T4 1.11, and vit D 29.3.  Started on weekly supplement by her PCP.  Symptoms have improved but not full resolve.  May feel the the numbness every now and then but definitely occurs when she is squatting.  Losing balance walking down the stairs is only occasional now as well.  In August 2024 she woke up in bed with numbness in the left arm. Symptoms lasted about 3 months.  She also felt anxious.  She went to the ED and was given Valium  which helped.  She also reports excess sweating in the palms of her hands and her feet.  If she is in the shower and washes her head, she feels lightheaded.  She has hot flashes.  PAST MEDICAL HISTORY: Past Medical History:  Diagnosis Date   Anxiety    Asthma    WELL CONTROLLED   Asthma    Depression    pt stated   Eczema 2008   Gallstones    GERD (gastroesophageal reflux disease)    Headache    Hx MRSA infection 09-2014   IBS (irritable bowel syndrome)    Ovarian cyst     MEDICATIONS: Current Outpatient Medications on File Prior to Visit  Medication Sig Dispense Refill   ALPRAZolam  (XANAX ) 1 MG tablet Take 1 tablet (1 mg total) by  mouth daily as needed for anxiety. 30 tablet 1   budesonide -formoterol  (SYMBICORT ) 80-4.5 MCG/ACT inhaler Inhale 2 puffs into the lungs 2 (two) times daily. 1 each 3   cetirizine (ZYRTEC) 10 MG tablet Take 10 mg by mouth daily as needed.     gabapentin  (NEURONTIN ) 300 MG capsule Take 1 capsule (300 mg total) by mouth at bedtime as needed. 90 capsule 3   montelukast  (SINGULAIR ) 10 MG tablet Take 1 tablet (10 mg total) by mouth daily. (Patient not taking: Reported on 08/12/2023) 30 tablet 2   nystatin  cream (MYCOSTATIN ) Apply topically 2 (two) times daily.     nystatin -triamcinolone  ointment (MYCOLOG)  Apply 1 Application topically 2 (two) times daily. 30 g 0   ondansetron  (ZOFRAN -ODT) 4 MG disintegrating tablet TAKE ONE TABLET BY MOUTH EVERY 8 HOURS AS NEEDED 10 tablet 0   phenazopyridine  (PYRIDIUM ) 200 MG tablet Take 1 tablet (200 mg total) by mouth 3 (three) times daily. (Patient not taking: Reported on 08/12/2023) 6 tablet 0   polyethylene glycol (MIRALAX  / GLYCOLAX ) 17 g packet Take 17 g by mouth daily. 14 each 0   SUBOXONE 8-2 MG FILM Place 8 mg of opioid under the tongue 2 (two) times daily.     VENTOLIN  HFA 108 (90 Base) MCG/ACT inhaler INHALE TWO PUFFS INTO LUNGS FOUR TIMES DAILY 18 g 0   Vitamin D , Ergocalciferol , (DRISDOL ) 1.25 MG (50000 UNIT) CAPS capsule Take 1 capsule (50,000 Units total) by mouth every 7 (seven) days. 20 capsule 1   vortioxetine  HBr (TRINTELLIX ) 10 MG TABS tablet Take 1 tablet (10 mg total) by mouth daily. (Patient taking differently: Take 20 mg by mouth daily.) 30 tablet 2   vortioxetine  HBr (TRINTELLIX ) 20 MG TABS tablet Take 1 tablet (20 mg total) by mouth daily. 30 tablet 2   No current facility-administered medications on file prior to visit.    ALLERGIES: Allergies  Allergen Reactions   Aloe Swelling   Latex Dermatitis    FAMILY HISTORY: Family History  Problem Relation Age of Onset   Migraines Mother    Diabetes Mother    Anxiety disorder Mother    Depression Mother    Depression Maternal Grandmother    Stroke Maternal Grandfather    Dementia Paternal Grandfather    Colon cancer Neg Hx    Crohn's disease Neg Hx    Celiac disease Neg Hx       Objective:  *** General: No acute distress.  Patient appears well-groomed.   ***   [name], DO  CC: Karen Zarwolo, FNP

## 2024-01-10 ENCOUNTER — Ambulatory Visit: Payer: Medicaid Other | Admitting: Neurology

## 2024-01-17 ENCOUNTER — Telehealth (HOSPITAL_COMMUNITY): Admitting: Psychiatry

## 2024-01-17 ENCOUNTER — Encounter (HOSPITAL_COMMUNITY): Payer: Self-pay | Admitting: Psychiatry

## 2024-01-17 DIAGNOSIS — F4310 Post-traumatic stress disorder, unspecified: Secondary | ICD-10-CM | POA: Diagnosis not present

## 2024-01-17 DIAGNOSIS — F321 Major depressive disorder, single episode, moderate: Secondary | ICD-10-CM | POA: Diagnosis not present

## 2024-01-17 MED ORDER — ALPRAZOLAM 1 MG PO TABS
1.0000 mg | ORAL_TABLET | Freq: Every day | ORAL | 1 refills | Status: DC | PRN
Start: 2024-01-17 — End: 2024-02-15

## 2024-01-17 MED ORDER — FLUOXETINE HCL 10 MG PO CAPS: 10.0000 mg | ORAL_CAPSULE | Freq: Every day | ORAL | 2 refills | Status: DC
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
## 2024-01-31 DIAGNOSIS — R197 Diarrhea, unspecified: Secondary | ICD-10-CM | POA: Diagnosis not present

## 2024-01-31 DIAGNOSIS — N3000 Acute cystitis without hematuria: Secondary | ICD-10-CM | POA: Diagnosis not present

## 2024-02-15 ENCOUNTER — Telehealth (INDEPENDENT_AMBULATORY_CARE_PROVIDER_SITE_OTHER): Admitting: Psychiatry

## 2024-02-15 ENCOUNTER — Encounter (HOSPITAL_COMMUNITY): Payer: Self-pay | Admitting: Psychiatry

## 2024-02-15 DIAGNOSIS — F321 Major depressive disorder, single episode, moderate: Secondary | ICD-10-CM

## 2024-02-15 DIAGNOSIS — F4310 Post-traumatic stress disorder, unspecified: Secondary | ICD-10-CM | POA: Diagnosis not present

## 2024-02-15 MED ORDER — ALPRAZOLAM 1 MG PO TABS: 1.0000 mg | ORAL_TABLET | Freq: Every day | ORAL | 1 refills | Status: DC | PRN

## 2024-02-15 MED ORDER — FLUOXETINE HCL 10 MG PO CAPS: 10.0000 mg | ORAL_CAPSULE | Freq: Every day | ORAL | 2 refills | Status: DC

## 2024-02-15 NOTE — Progress Notes (Signed)
 Virtual Visit via Video Note  I connected with Karen Shields on 02/15/24 at  9:20 AM EDT by a video enabled telemedicine application and verified that I am speaking with the correct person using two identifiers.  Location: Patient: home Provider: office   I discussed the limitations of evaluation and management by telemedicine and the availability of in person appointments. The patient expressed understanding and agreed to proceed.     I discussed the assessment and treatment plan with the patient. The patient was provided an opportunity to ask questions and all were answered. The patient agreed with the plan and demonstrated an understanding of the instructions.   The patient was advised to call back or seek an in-person evaluation if the symptoms worsen or if the condition fails to improve as anticipated.  I provided 15 minutes of non-face-to-face time during this encounter.   Alfredia Annas, MD  [facility] MD/PA/NP OP Progress Note  02/15/2024 9:25 AM Karen Shields  MRN:  [id number]  Chief Complaint:  Chief Complaint  Patient presents with   Depression   Anxiety   Follow-up   HPI: This patient is a 33 year old separated white female who lives with her 2 daughters in [place]. 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  The patient returns for follow-up after 4 weeks regarding her depression and anxiety.  Last time she did not like being on the Trintellix  because it was causing weight gain.  She is now on Prozac  10 mg.  She states that she is generally feeling better.  Her mood has improved and she has a bit more energy.  She continues to sleep well.  The patient is still on Suboxone even though she claims she wants to get off of it she states she was originally put on it for back pain but her back does not hurt anymore.  I again referred her to a local drug treatment program and she claims that she will look into this.  She is still taking Xanax  1  mg daily and I have explained that she cannot combine the 2 drugs at the same time.  Visit Diagnosis:    ICD-10-CM   1. Current moderate episode of major depressive disorder without prior episode (HCC)  F32.1     2. PTSD (post-traumatic stress disorder)  F43.10 ALPRAZolam  (XANAX ) 1 MG tablet      Past Psychiatric History: Treatment at [facility] in the past.  She had 1 hospitalization about 10 years ago for depression with suicidal ideation.  Past Medical History:  Past Medical History:  Diagnosis Date   Anxiety    Asthma    WELL CONTROLLED   Asthma    Depression    pt stated   Eczema 2008   Gallstones    GERD (gastroesophageal reflux disease)    Headache    Hx MRSA infection 09-2014   IBS (irritable bowel syndrome)    Ovarian cyst     Past Surgical History:  Procedure Laterality Date   CHOLECYSTECTOMY N/A 01/16/2016   Procedure: LAPAROSCOPIC CHOLECYSTECTOMY;  Surgeon: Claudia Cuff, MD;  Location: [facility] ORS;  Service: General;  Laterality: N/A;   ESOPHAGOGASTRODUODENOSCOPY N/A 11/13/2015   NWG:NFAOZHY reflux    OVARIAN CYST REMOVAL     OVARIAN CYST REMOVAL  2011   TUBAL LIGATION     WISDOM TOOTH EXTRACTION      Family Psychiatric History: See below  Family History:  Family History  Problem Relation Age of  Onset   Migraines Mother    Diabetes Mother    Anxiety disorder Mother    Depression Mother    Depression Maternal Grandmother    Stroke Maternal Grandfather    Dementia Paternal Grandfather    Colon cancer Neg Hx    Crohn's disease Neg Hx    Celiac disease Neg Hx     Social History:  Social History   Socioeconomic History   Marital status: Legally Separated    Spouse name: Not on file   Number of children: 2   Years of education: Not on file   Highest education level: Not on file  Occupational History   Not on file  Tobacco Use   Smoking status: Every Day    Current packs/day: 0.75    Average packs/day: 0.8 packs/day for 18.0 years (13.5 ttl  pk-yrs)    Types: Cigarettes    Start date: 02/11/2006   Smokeless tobacco: Never  Vaping Use   Vaping status: Former  Substance and Sexual Activity   Alcohol use: Yes    Alcohol/week: 0.0 standard drinks of alcohol    Comment: rarely, maybe once a year   Drug use: Not Currently    Types: Benzodiazepines, Opium   Sexual activity: Yes    Partners: Male    Birth control/protection: Surgical  Other Topics Concern   Not on file  Social History Narrative   ** Merged History Encounter **    Four daughters between herself and husband, two Human resources officer.    Social Drivers of Corporate investment banker Strain: Not on file  Food Insecurity: Not on file  Transportation Needs: No Transportation Needs (11/22/2023)   [facility]   PRAPARE - Transportation    Lack of Transportation (Medical): No    Lack of Transportation (Non-Medical): No  Physical Activity: Not on file  Stress: Not on file  Social Connections: Not on file    Allergies:  Allergies  Allergen Reactions   Aloe Swelling   Latex Dermatitis    Metabolic Disorder Labs: Lab Results  Component Value Date   HGBA1C 5.7 (H) 05/13/2023   No results found for: "PROLACTIN" Lab Results  Component Value Date   CHOL 169 05/13/2023   TRIG 130 05/13/2023   HDL 33 (L) 05/13/2023   CHOLHDL 5.1 (H) 05/13/2023   LDLCALC 112 (H) 05/13/2023   LDLCALC 100 (H) 11/12/2022   Lab Results  Component Value Date   TSH 1.450 05/13/2023   TSH 0.658 04/22/2023    Therapeutic Level Labs: No results found for: "LITHIUM" No results found for: "VALPROATE" No results found for: "CBMZ"  Current Medications: Current Outpatient Medications  Medication Sig Dispense Refill   ALPRAZolam  (XANAX ) 1 MG tablet Take 1 tablet (1 mg total) by mouth daily as needed for anxiety. 30 tablet 1   budesonide -formoterol  (SYMBICORT ) 80-4.5 MCG/ACT inhaler Inhale 2 puffs into the lungs 2 (two) times daily. 1 each 3   cetirizine (ZYRTEC) 10 MG  tablet Take 10 mg by mouth daily as needed.     FLUoxetine  (PROZAC ) 10 MG capsule Take 1 capsule (10 mg total) by mouth daily. 30 capsule 2   gabapentin  (NEURONTIN ) 300 MG capsule Take 1 capsule (300 mg total) by mouth at bedtime as needed. 90 capsule 3   montelukast  (SINGULAIR ) 10 MG tablet Take 1 tablet (10 mg total) by mouth daily. (Patient not taking: Reported on 08/12/2023) 30 tablet 2   nystatin  cream (MYCOSTATIN ) Apply topically 2 (two) times daily.  nystatin -triamcinolone  ointment (MYCOLOG) Apply 1 Application topically 2 (two) times daily. 30 g 0   ondansetron  (ZOFRAN -ODT) 4 MG disintegrating tablet TAKE ONE TABLET BY MOUTH EVERY 8 HOURS AS NEEDED 10 tablet 0   phenazopyridine  (PYRIDIUM ) 200 MG tablet Take 1 tablet (200 mg total) by mouth 3 (three) times daily. (Patient not taking: Reported on 08/12/2023) 6 tablet 0   polyethylene glycol (MIRALAX  / GLYCOLAX ) 17 g packet Take 17 g by mouth daily. 14 each 0   SUBOXONE 8-2 MG FILM Place 8 mg of opioid under the tongue 2 (two) times daily.     VENTOLIN  HFA 108 (90 Base) MCG/ACT inhaler INHALE TWO PUFFS INTO LUNGS FOUR TIMES DAILY 18 g 0   Vitamin D , Ergocalciferol , (DRISDOL ) 1.25 MG (50000 UNIT) CAPS capsule Take 1 capsule (50,000 Units total) by mouth every 7 (seven) days. 20 capsule 1   No current facility-administered medications for this visit.     Musculoskeletal: Strength & Muscle Tone: within normal limits Gait & Station: normal Patient leans: N/A  Psychiatric Specialty Exam: Review of Systems  All other systems reviewed and are negative.   There were no vitals taken for this visit.There is no height or weight on file to calculate BMI.  General Appearance: Casual and Fairly Groomed  Eye Contact:  Good  Speech:  Clear and Coherent  Volume:  Normal  Mood:  Euthymic  Affect:  Congruent  Thought Process:  Goal Directed  Orientation:  Full (Time, Place, and Person)  Thought Content: WDL   Suicidal Thoughts:  No   Homicidal Thoughts:  No  Memory:  Immediate;   Good Recent;   Good Remote;   Fair  Judgement:  Good  Insight:  Fair  Psychomotor Activity:  Normal  Concentration:  Concentration: Good and Attention Span: Good  Recall:  Good  Fund of Knowledge: Good  Language: Good  Akathisia:  No  Handed:  Right  AIMS (if indicated): not done  Assets:  Communication Skills Desire for Improvement Physical Health Resilience Social Support  ADL's:  Intact  Cognition: WNL  Sleep:  Good   Screenings: AIMS    Flowsheet Row [facility]  AIMS Total Score 0      AUDIT    Flowsheet Row Ad[facility] ([facility]  Alcohol Use Disorder Identification Test Final Score (AUDIT) 2      GAD-7    Flowsheet Row Office Visit from 06/03/2023 in [facility] Primary Care Office Visit from 05/03/2023 in [facility] Primary Care Office Visit from 02/22/2023 in [facility] Primary Care Office Visit from 11/05/2022 in [facility] Primary Care Counselor from 04/21/2022 in [facility] at [place]  Total GAD-7 Score 0 0 15 11 3       PHQ2-9    Flowsheet Row Office Visit from 08/12/2023 in [facility] Primary Care Office Visit from 06/03/2023 in [facility] Primary Care Office Visit from 05/03/2023 in [facility] Primary Care Office Visit from 02/22/2023 in [facility] Primary Care Office Visit from 11/05/2022 in [facility] Primary Care  PHQ-2 Total Score 0 0 0 3 3  PHQ-9 Total Score -- 0 0 12 15      Flowsheet Row ED from 06/06/2023 in [facility] Emergency Department at [facility] ED from 04/22/2023 in [facility] Emergency Department at [facility] ED from 04/21/2023  in [facility] Emergency Department at [facility]  C-SSRS RISK CATEGORY  No Risk No Risk No Risk        Assessment and Plan: This patient is a 34 year old female with a history of depression and anxiety.  She seems to be doing better with Prozac  10 mg daily and this will be continued for depression and anxiety.  She will also continue Xanax  1 mg daily as needed for anxiety.  She will return to see me in 3 months.  She is strongly encouraged to get off the Suboxone through a substance abuse treatment program.  Collaboration of Care: Collaboration of Care: Referral or follow-up with counselor/therapist AEB patient will continue therapy with Fayne Hoover in our office  Patient/Guardian was advised Release of Information must be obtained prior to any record release in order to collaborate their care with an outside provider. Patient/Guardian was advised if they have not already done so to contact the registration department to sign all necessary forms in order for us  to release information regarding their care.   Consent: Patient/Guardian gives verbal consent for treatment and assignment of benefits for services provided during this visit. Patient/Guardian expressed understanding and agreed to proceed.    Alfredia Annas, MD 02/15/2024, 9:25 AM

## 2024-02-17 DIAGNOSIS — Z6831 Body mass index (BMI) 31.0-31.9, adult: Secondary | ICD-10-CM | POA: Diagnosis not present

## 2024-02-17 DIAGNOSIS — E669 Obesity, unspecified: Secondary | ICD-10-CM | POA: Diagnosis not present

## 2024-02-17 DIAGNOSIS — M545 Low back pain, unspecified: Secondary | ICD-10-CM | POA: Diagnosis not present

## 2024-02-17 DIAGNOSIS — N390 Urinary tract infection, site not specified: Secondary | ICD-10-CM | POA: Diagnosis not present

## 2024-03-25 ENCOUNTER — Encounter (HOSPITAL_COMMUNITY): Payer: Self-pay | Admitting: *Deleted

## 2024-03-25 ENCOUNTER — Other Ambulatory Visit: Payer: Self-pay

## 2024-03-25 ENCOUNTER — Emergency Department (HOSPITAL_COMMUNITY)
Admission: EM | Admit: 2024-03-25 | Discharge: 2024-03-25 | Disposition: A | Discharge status: Home / Self Care | Attending: Emergency Medicine | Admitting: Emergency Medicine

## 2024-03-25 DIAGNOSIS — W44G9XA Other non-organic objects entering into or through a natural orifice, initial encounter: Secondary | ICD-10-CM | POA: Diagnosis not present

## 2024-03-25 DIAGNOSIS — T192XXA Foreign body in vulva and vagina, initial encounter: Secondary | ICD-10-CM | POA: Diagnosis not present

## 2024-03-25 MED ORDER — CLINDAMYCIN HCL 300 MG PO CAPS
300.0000 mg | ORAL_CAPSULE | Freq: Four times a day (QID) | ORAL | 0 refills | Status: AC
Start: 2024-03-25 — End: ?

## 2024-03-25 NOTE — ED Provider Notes (Signed)
 [place] EMERGENCY DEPARTMENT AT [facility] Provider Note   CSN: [id number] Arrival date & time: 03/25/24  1139     Patient presents with: Foreign Body in Vagina   Karen Shields is a 33 y.o. female.   Patient thinks she has a tampon in her vagina.  She is not sure how long it has been there  The history is provided by the patient and medical records. No language interpreter was used.  Foreign Body in Vagina This is a new problem. Episode onset: Unknown. The problem occurs constantly. The problem has not changed since onset.Pertinent negatives include no chest pain, no abdominal pain and no headaches. Nothing aggravates the symptoms. Nothing relieves the symptoms.       Prior to Admission medications   Medication Sig Start Date End Date Taking? Authorizing Provider  clindamycin  (CLEOCIN ) 300 MG capsule Take 1 capsule (300 mg total) by mouth 4 (four) times daily. X 7 days 03/25/24  Yes Rona Tomson, MD  ALPRAZolam  (XANAX ) 1 MG tablet Take 1 tablet (1 mg total) by mouth daily as needed for anxiety. 02/15/24   Okey Barnie SAUNDERS, MD  budesonide -formoterol  (SYMBICORT ) 80-4.5 MCG/ACT inhaler Inhale 2 puffs into the lungs 2 (two) times daily. 08/09/22   Zarwolo, Gloria, FNP  cetirizine (ZYRTEC) 10 MG tablet Take 10 mg by mouth daily as needed.    [provider]  FLUoxetine  (PROZAC ) 10 MG capsule Take 1 capsule (10 mg total) by mouth daily. 02/15/24   Okey Barnie SAUNDERS, MD  gabapentin  (NEURONTIN ) 300 MG capsule Take 1 capsule (300 mg total) by mouth at bedtime as needed. 08/12/23   Del Orbe Polanco, Iliana, FNP  montelukast  (SINGULAIR ) 10 MG tablet Take 1 tablet (10 mg total) by mouth daily. Patient not taking: Reported on 08/12/2023 08/29/22 08/29/23  Ricky Fines, MD  nystatin  cream (MYCOSTATIN ) Apply topically 2 (two) times daily. 08/09/22   [provider]  nystatin -triamcinolone  ointment (MYCOLOG) Apply 1 Application topically 2 (two) times daily. 08/09/22    Zarwolo, Gloria, FNP  ondansetron  (ZOFRAN -ODT) 4 MG disintegrating tablet TAKE ONE TABLET BY MOUTH EVERY 8 HOURS AS NEEDED 11/23/23   Zarwolo, Gloria, FNP  phenazopyridine  (PYRIDIUM ) 200 MG tablet Take 1 tablet (200 mg total) by mouth 3 (three) times daily. Patient not taking: Reported on 08/12/2023 06/07/23   Midge Golas, MD  polyethylene glycol (MIRALAX  / GLYCOLAX ) 17 g packet Take 17 g by mouth daily. 06/07/23   Midge Golas, MD  SUBOXONE 8-2 MG FILM Place 8 mg of opioid under the tongue 2 (two) times daily. 08/09/23   [provider]  VENTOLIN  HFA 108 (90 Base) MCG/ACT inhaler INHALE TWO PUFFS INTO LUNGS FOUR TIMES DAILY 03/17/23   Zarwolo, Gloria, FNP  Vitamin D , Ergocalciferol , (DRISDOL ) 1.25 MG (50000 UNIT) CAPS capsule Take 1 capsule (50,000 Units total) by mouth every 7 (seven) days. 05/15/23   Zarwolo, Gloria, FNP    Allergies: Aloe and Latex    Review of Systems  Constitutional:  Negative for appetite change and fatigue.  HENT:  Negative for congestion, ear discharge and sinus pressure.   Eyes:  Negative for discharge.  Respiratory:  Negative for cough.   Cardiovascular:  Negative for chest pain.  Gastrointestinal:  Negative for abdominal pain and diarrhea.  Genitourinary:  Negative for frequency and hematuria.       Vaginal discharge  Musculoskeletal:  Negative for back pain.  Skin:  Negative for rash.  Neurological:  Negative for seizures and headaches.  Psychiatric/Behavioral:  Negative for hallucinations.     Updated Vital Signs BP (!) 150/96 (BP Location: Right Arm)   Pulse 90   Temp 97.9 F (36.6 C) (Oral)   Resp 20   Ht 5' 4 (1.626 m)   Wt 81.6 kg   LMP  (LMP Unknown)   SpO2 99%   BMI 30.90 kg/m   Physical Exam Vitals and nursing note reviewed.  Constitutional:      Appearance: She is well-developed.  HENT:     Head: Normocephalic.     Nose: Nose normal.  Eyes:     General: No scleral icterus.    Conjunctiva/sclera: Conjunctivae normal.   Neck:     Thyroid : No thyromegaly.  Cardiovascular:     Rate and Rhythm: Normal rate and regular rhythm.     Heart sounds: No murmur heard.    No friction rub. No gallop.  Pulmonary:     Breath sounds: No stridor. No wheezing or rales.  Chest:     Chest wall: No tenderness.  Abdominal:     General: There is no distension.     Tenderness: There is no abdominal tenderness. There is no rebound.  Genitourinary:    Comments: Pelvic exam shows retained tampon in vault.  It was removed without problems Musculoskeletal:        General: Normal range of motion.     Cervical back: Neck supple.  Lymphadenopathy:     Cervical: No cervical adenopathy.  Skin:    Findings: No erythema or rash.  Neurological:     Mental Status: She is alert and oriented to person, place, and time.     Motor: No abnormal muscle tone.     Coordination: Coordination normal.  Psychiatric:        Behavior: Behavior normal.     (all labs ordered are listed, but only abnormal results are displayed) Labs Reviewed  GC/CHLAMYDIA PROBE AMP ([place]) NOT AT [facility]    EKG: None  Radiology: No results found.   Procedures   Medications Ordered in the ED - No data to display                                  Medical Decision Making Risk Prescription drug management.   Foreign body in vagina that has been removed.  Patient is placed on clindamycin      Final diagnoses:  Foreign body in vagina, initial encounter    ED Discharge Orders          Ordered    clindamycin  (CLEOCIN ) 300 MG capsule  4 times daily        03/25/24 1221               Suzette Pac, MD 03/25/24 1700

## 2024-03-25 NOTE — ED Triage Notes (Signed)
 Pt believes a tampon may be stuck in vagina and unsure for how long. Pt states she tried to feel for anything.  Pt states she has not felt well. + chills, denies fevers. + N/V + vaginal discharge + vaginal odor + vaginal spotting + LLQ pain

## 2024-03-25 NOTE — Discharge Instructions (Signed)
 Follow up with your md if any problems like fever chills abdominal pain

## 2024-03-26 LAB — GC/CHLAMYDIA PROBE AMP (~~LOC~~) NOT AT ARMC
Chlamydia: NEGATIVE
Comment: NEGATIVE
Comment: NORMAL
Neisseria Gonorrhea: NEGATIVE

## 2024-04-04 ENCOUNTER — Other Ambulatory Visit (HOSPITAL_COMMUNITY): Payer: Self-pay | Admitting: Psychiatry

## 2024-04-04 DIAGNOSIS — F4310 Post-traumatic stress disorder, unspecified: Secondary | ICD-10-CM

## 2024-04-04 NOTE — Telephone Encounter (Signed)
 Call for appt

## 2024-04-11 ENCOUNTER — Ambulatory Visit (INDEPENDENT_AMBULATORY_CARE_PROVIDER_SITE_OTHER): Admitting: Psychiatry

## 2024-04-11 DIAGNOSIS — F321 Major depressive disorder, single episode, moderate: Secondary | ICD-10-CM

## 2024-04-11 DIAGNOSIS — F4310 Post-traumatic stress disorder, unspecified: Secondary | ICD-10-CM
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
## 2024-04-13 ENCOUNTER — Telehealth (INDEPENDENT_AMBULATORY_CARE_PROVIDER_SITE_OTHER): Admitting: Psychiatry

## 2024-04-13 ENCOUNTER — Encounter (HOSPITAL_COMMUNITY): Payer: Self-pay | Admitting: Psychiatry

## 2024-04-13 DIAGNOSIS — F321 Major depressive disorder, single episode, moderate: Secondary | ICD-10-CM | POA: Diagnosis not present

## 2024-04-13 DIAGNOSIS — F4310 Post-traumatic stress disorder, unspecified: Secondary | ICD-10-CM | POA: Diagnosis not present

## 2024-04-13 MED ORDER — ALPRAZOLAM 1 MG PO TABS
1.0000 mg | ORAL_TABLET | Freq: Every day | ORAL | 2 refills | Status: DC | PRN
Start: 2024-04-13 — End: 2024-07-13

## 2024-04-13 MED ORDER — FLUOXETINE HCL 10 MG PO CAPS: 10.0000 mg | ORAL_CAPSULE | Freq: Every day | ORAL | 2 refills | Status: DC
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
## 2024-04-19 DIAGNOSIS — F411 Generalized anxiety disorder: Secondary | ICD-10-CM | POA: Diagnosis not present

## 2024-04-19 DIAGNOSIS — F4312 Post-traumatic stress disorder, chronic: Secondary | ICD-10-CM | POA: Diagnosis not present

## 2024-04-19 DIAGNOSIS — F320 Major depressive disorder, single episode, mild: Secondary | ICD-10-CM | POA: Diagnosis not present

## 2024-05-03 ENCOUNTER — Ambulatory Visit (HOSPITAL_COMMUNITY): Admitting: Psychiatry

## 2024-05-03 ENCOUNTER — Telehealth (HOSPITAL_COMMUNITY): Payer: Self-pay | Admitting: Psychiatry

## 2024-05-03 NOTE — Telephone Encounter (Signed)
 Therapist attempted to contact patient via text through caregility platform for scheduled appointment, no response.  Therapist called patient and did not receive an answer or the opportunity to leave a voice message.

## 2024-05-18 ENCOUNTER — Ambulatory Visit (HOSPITAL_COMMUNITY): Admitting: Psychiatry

## 2024-05-18 DIAGNOSIS — F329 Major depressive disorder, single episode, unspecified: Secondary | ICD-10-CM | POA: Diagnosis not present

## 2024-05-18 DIAGNOSIS — F4310 Post-traumatic stress disorder, unspecified: Secondary | ICD-10-CM | POA: Diagnosis not present

## 2024-05-18 DIAGNOSIS — F321 Major depressive disorder, single episode, moderate: Secondary | ICD-10-CM
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
## 2024-05-30 ENCOUNTER — Other Ambulatory Visit (HOSPITAL_COMMUNITY): Payer: Self-pay | Admitting: Psychiatry

## 2024-05-30 DIAGNOSIS — F4310 Post-traumatic stress disorder, unspecified: Secondary | ICD-10-CM

## 2024-06-04 ENCOUNTER — Telehealth (HOSPITAL_COMMUNITY): Payer: Self-pay | Admitting: Psychiatry

## 2024-06-04 ENCOUNTER — Ambulatory Visit (HOSPITAL_COMMUNITY): Admitting: Psychiatry

## 2024-06-04 DIAGNOSIS — N760 Acute vaginitis: Secondary | ICD-10-CM | POA: Diagnosis not present

## 2024-06-04 NOTE — Telephone Encounter (Signed)
 Therapist called patient regarding scheduled appointment.  Patient reports family emergency and unable to complete appointment today.  Therapist and patient agreed to cancel and reschedule.

## 2024-06-18 ENCOUNTER — Ambulatory Visit: Admitting: Physician Assistant

## 2024-07-06 ENCOUNTER — Ambulatory Visit (HOSPITAL_COMMUNITY): Admitting: Psychiatry

## 2024-07-06 DIAGNOSIS — F4310 Post-traumatic stress disorder, unspecified: Secondary | ICD-10-CM

## 2024-07-06 DIAGNOSIS — F329 Major depressive disorder, single episode, unspecified: Secondary | ICD-10-CM | POA: Diagnosis not present
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
## 2024-07-13 ENCOUNTER — Telehealth (INDEPENDENT_AMBULATORY_CARE_PROVIDER_SITE_OTHER): Admitting: Psychiatry

## 2024-07-13 ENCOUNTER — Encounter (HOSPITAL_COMMUNITY): Payer: Self-pay | Admitting: Psychiatry

## 2024-07-13 DIAGNOSIS — F4310 Post-traumatic stress disorder, unspecified: Secondary | ICD-10-CM

## 2024-07-13 MED ORDER — FLUOXETINE HCL 20 MG PO CAPS: 20.0000 mg | ORAL_CAPSULE | Freq: Every day | ORAL | 2 refills | Status: AC

## 2024-07-13 MED ORDER — ALPRAZOLAM 1 MG PO TABS: 1.0000 mg | ORAL_TABLET | Freq: Every day | ORAL | 2 refills | Status: DC | PRN
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
## 2024-07-28 DIAGNOSIS — R110 Nausea: Secondary | ICD-10-CM | POA: Diagnosis not present

## 2024-07-28 DIAGNOSIS — N390 Urinary tract infection, site not specified: Secondary | ICD-10-CM | POA: Diagnosis not present

## 2024-08-03 DIAGNOSIS — K5900 Constipation, unspecified: Secondary | ICD-10-CM | POA: Diagnosis not present

## 2024-08-03 DIAGNOSIS — R030 Elevated blood-pressure reading, without diagnosis of hypertension: Secondary | ICD-10-CM | POA: Diagnosis not present

## 2024-08-13 ENCOUNTER — Ambulatory Visit (INDEPENDENT_AMBULATORY_CARE_PROVIDER_SITE_OTHER): Admitting: Psychiatry

## 2024-08-13 DIAGNOSIS — F329 Major depressive disorder, single episode, unspecified: Secondary | ICD-10-CM

## 2024-08-13 DIAGNOSIS — F4310 Post-traumatic stress disorder, unspecified: Secondary | ICD-10-CM | POA: Diagnosis not present

## 2024-08-13 DIAGNOSIS — F321 Major depressive disorder, single episode, moderate: Secondary | ICD-10-CM
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
## 2024-08-27 ENCOUNTER — Ambulatory Visit (INDEPENDENT_AMBULATORY_CARE_PROVIDER_SITE_OTHER): Admitting: Psychiatry

## 2024-08-27 DIAGNOSIS — F4310 Post-traumatic stress disorder, unspecified: Secondary | ICD-10-CM

## 2024-08-27 DIAGNOSIS — F329 Major depressive disorder, single episode, unspecified: Secondary | ICD-10-CM | POA: Diagnosis not present

## 2024-08-27 DIAGNOSIS — F321 Major depressive disorder, single episode, moderate: Secondary | ICD-10-CM
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
## 2024-09-10 ENCOUNTER — Telehealth (HOSPITAL_COMMUNITY): Payer: Self-pay | Admitting: Psychiatry

## 2024-09-10 ENCOUNTER — Ambulatory Visit (HOSPITAL_COMMUNITY): Admitting: Psychiatry

## 2024-09-10 NOTE — Telephone Encounter (Signed)
 Therapist attempted to contact patient via text through caregility platform for scheduled appointment, no response.  Therapist called patient, left message indicating attempt and requesting patient call office.

## 2024-09-15 DIAGNOSIS — N390 Urinary tract infection, site not specified: Secondary | ICD-10-CM | POA: Diagnosis not present

## 2024-09-28 ENCOUNTER — Ambulatory Visit (HOSPITAL_COMMUNITY): Admitting: Psychiatry

## 2024-10-10 ENCOUNTER — Telehealth: Payer: Self-pay | Admitting: *Deleted

## 2024-10-10 DIAGNOSIS — J45909 Unspecified asthma, uncomplicated: Secondary | ICD-10-CM

## 2024-10-10 DIAGNOSIS — F419 Anxiety disorder, unspecified: Secondary | ICD-10-CM

## 2024-10-10 NOTE — Progress Notes (Unsigned)
 Complex Care Management Note Care Guide Note  10/10/2024 Name: QUANEISHA HANISCH MRN: [id number] DOB: 1991-03-31   Complex Care Management Outreach Attempts: An unsuccessful telephone outreach was attempted today to offer the patient information about available complex care management services.  Follow Up Plan:  Additional outreach attempts will be made to offer the patient complex care management information and services.   Encounter Outcome:  No Answer  Harlene Satterfield  [facility]  [facility], [facility] Guide  Direct Dial: [phone]  Fax [phone]

## 2024-10-11 ENCOUNTER — Encounter (HOSPITAL_COMMUNITY): Payer: Self-pay | Admitting: *Deleted

## 2024-10-11 ENCOUNTER — Other Ambulatory Visit: Payer: Self-pay

## 2024-10-11 ENCOUNTER — Emergency Department (HOSPITAL_COMMUNITY)

## 2024-10-11 ENCOUNTER — Emergency Department (HOSPITAL_COMMUNITY): Admission: EM | Admit: 2024-10-11 | Discharge: 2024-10-11 | Disposition: A | Discharge status: Home / Self Care

## 2024-10-11 DIAGNOSIS — R110 Nausea: Secondary | ICD-10-CM | POA: Insufficient documentation

## 2024-10-11 DIAGNOSIS — R197 Diarrhea, unspecified: Secondary | ICD-10-CM | POA: Diagnosis not present

## 2024-10-11 DIAGNOSIS — Z91040 Latex allergy status: Secondary | ICD-10-CM | POA: Insufficient documentation

## 2024-10-11 DIAGNOSIS — R1033 Periumbilical pain: Secondary | ICD-10-CM | POA: Diagnosis not present

## 2024-10-11 DIAGNOSIS — E876 Hypokalemia: Secondary | ICD-10-CM

## 2024-10-11 DIAGNOSIS — N83202 Unspecified ovarian cyst, left side: Secondary | ICD-10-CM

## 2024-10-11 DIAGNOSIS — R1030 Lower abdominal pain, unspecified: Secondary | ICD-10-CM

## 2024-10-11 DIAGNOSIS — R1084 Generalized abdominal pain: Secondary | ICD-10-CM | POA: Diagnosis present

## 2024-10-11 LAB — COMPREHENSIVE METABOLIC PANEL WITH GFR
ALT: 17 U/L (ref 0–44)
AST: 21 U/L (ref 15–41)
Albumin: 4.3 g/dL (ref 3.5–5.0)
Alkaline Phosphatase: 101 U/L (ref 38–126)
Anion gap: 14 (ref 5–15)
BUN: <5 mg/dL — ABNORMAL LOW (ref 6–20)
CO2: 23 mmol/L (ref 22–32)
Calcium: 9.4 mg/dL (ref 8.9–10.3)
Chloride: 102 mmol/L (ref 98–111)
Creatinine, Ser: 0.6 mg/dL (ref 0.44–1.00)
GFR, Estimated: >60 mL/min
Glucose, Bld: 100 mg/dL — ABNORMAL HIGH (ref 70–99)
Potassium: 3.2 mmol/L — ABNORMAL LOW (ref 3.5–5.1)
Sodium: 138 mmol/L (ref 135–145)
Total Bilirubin: 0.4 mg/dL (ref 0.0–1.2)
Total Protein: 7.2 g/dL (ref 6.5–8.1)

## 2024-10-11 LAB — URINALYSIS, ROUTINE W REFLEX MICROSCOPIC
Bilirubin Urine: NEGATIVE
Glucose, UA: NEGATIVE mg/dL
Hgb urine dipstick: NEGATIVE
Ketones, ur: NEGATIVE mg/dL
Leukocytes,Ua: NEGATIVE
Nitrite: NEGATIVE
Protein, ur: NEGATIVE mg/dL
Specific Gravity, Urine: 1.002 — ABNORMAL LOW (ref 1.005–1.030)
pH: 7 (ref 5.0–8.0)

## 2024-10-11 LAB — CBC
HCT: 40.6 % (ref 36.0–46.0)
Hemoglobin: 13.5 g/dL (ref 12.0–15.0)
MCH: 28.7 pg (ref 26.0–34.0)
MCHC: 33.3 g/dL (ref 30.0–36.0)
MCV: 86.4 fL (ref 80.0–100.0)
Platelets: 411 K/uL — ABNORMAL HIGH (ref 150–400)
RBC: 4.7 MIL/uL (ref 3.87–5.11)
RDW: 12.4 % (ref 11.5–15.5)
WBC: 9.9 K/uL (ref 4.0–10.5)
nRBC: 0 % (ref 0.0–0.2)

## 2024-10-11 LAB — PREGNANCY, URINE: Preg Test, Ur: NEGATIVE

## 2024-10-11 LAB — MAGNESIUM: Magnesium: 1.7 mg/dL (ref 1.7–2.4)

## 2024-10-11 LAB — LIPASE, BLOOD: Lipase: 12 U/L (ref 11–51)

## 2024-10-11 LAB — HCG, QUANTITATIVE, PREGNANCY: hCG, Beta Chain, Quant, S: <1 m[IU]/mL

## 2024-10-11 MED ORDER — POTASSIUM CHLORIDE CRYS ER 20 MEQ PO TBCR
40.0000 meq | EXTENDED_RELEASE_TABLET | Freq: Once | ORAL | Status: AC
  Administered 2024-10-11: 40 meq via ORAL
  Filled 2024-10-11: qty 2

## 2024-10-11 MED ORDER — DICYCLOMINE HCL 20 MG PO TABS: 20.0000 mg | ORAL_TABLET | Freq: Two times a day (BID) | ORAL | 0 refills | Status: AC

## 2024-10-11 MED ORDER — HYOSCYAMINE SULFATE 0.125 MG SL SUBL
0.1250 mg | SUBLINGUAL_TABLET | Freq: Once | SUBLINGUAL | Status: AC
  Administered 2024-10-11: 0.125 mg via ORAL
  Filled 2024-10-11: qty 1

## 2024-10-11 MED ORDER — IOHEXOL 300 MG/ML  SOLN
100.0000 mL | Freq: Once | INTRAMUSCULAR | Status: AC | PRN
  Administered 2024-10-11: 100 mL via INTRAVENOUS

## 2024-10-11 MED ORDER — ONDANSETRON 4 MG PO TBDP: 4.0000 mg | ORAL_TABLET | Freq: Three times a day (TID) | ORAL | 0 refills | Status: AC | PRN

## 2024-10-11 MED ORDER — PANTOPRAZOLE SODIUM 20 MG PO TBEC: 20.0000 mg | DELAYED_RELEASE_TABLET | Freq: Every day | ORAL | 0 refills | Status: AC

## 2024-10-11 MED ORDER — DIPHENHYDRAMINE HCL 50 MG/ML IJ SOLN
50.0000 mg | Freq: Once | INTRAMUSCULAR | Status: AC
  Administered 2024-10-11: 50 mg via INTRAVENOUS
  Filled 2024-10-11: qty 1

## 2024-10-11 MED ORDER — LORAZEPAM 1 MG PO TABS
1.0000 mg | ORAL_TABLET | Freq: Once | ORAL | Status: AC
  Administered 2024-10-11: 1 mg via ORAL
  Filled 2024-10-11: qty 1

## 2024-10-11 MED ORDER — SODIUM CHLORIDE 0.9 % IV BOLUS
1000.0000 mL | Freq: Once | INTRAVENOUS | Status: AC
  Administered 2024-10-11: 1000 mL via INTRAVENOUS

## 2024-10-11 MED ORDER — METHYLPREDNISOLONE SODIUM SUCC 125 MG IJ SOLR
125.0000 mg | Freq: Once | INTRAMUSCULAR | Status: AC
  Administered 2024-10-11: 125 mg via INTRAVENOUS
  Filled 2024-10-11: qty 2

## 2024-10-11 MED ORDER — HYOSCYAMINE SULFATE 0.125 MG PO TABS: 0.1250 mg | ORAL_TABLET | Freq: Once | ORAL | Status: DC

## 2024-10-11 NOTE — ED Notes (Signed)
 PA at bedside. Pt in nad

## 2024-10-11 NOTE — ED Notes (Signed)
 Patient transported to CT

## 2024-10-11 NOTE — ED Provider Notes (Signed)
" ° °  Patient signed out to me by Lonni Conger, PA-C pending reevaluation.  Patient seen here for abdominal cramping and nausea after eating and intermittent diarrhea.  No reported fever or chills.  No dysuria symptoms.  A CT scan of the abdomen and pelvis was ordered and after receiving IV contrast, patient had episode of feeling shortness of breath and difficulty swallowing.  There was no stridor or wheezing on her exam she was given Solu-Medrol  and Benadryl  and monitored here in the department.    She has been observed in the department for extended period of time, there is been no complications she does seem anxious on my exam she has tolerated oral fluids without difficulty her vital signs have been reassuring.  Her lungs are clear to auscultation bilaterally, no edema or rash.  She was given oral Ativan  and her symptoms have improved   I feel that she is appropriate for discharge home at this time.  She was prescribed PPI antiemetic and Bentyl .  Follow-up information given for GI.  She will also see OB/GYN for cyst on her left ovary.   Herlinda Milling, PA-C 10/11/24 [id number]    Simon Lavonia SAILOR, MD 10/11/24 [id number]  "

## 2024-10-11 NOTE — ED Notes (Signed)
 Pt returns from CT tearful stating that she feels like she can't swallow and the CT made her 'feel weird' ED PA made aware. Pt speaking in full sentences and maintaining secretions.

## 2024-10-11 NOTE — Progress Notes (Unsigned)
 Complex Care Management Note Care Guide Note  10/11/2024 Name: MARGUERETTE SHELLER MRN: [id number] DOB: 1990-11-26   Complex Care Management Outreach Attempts: A second unsuccessful outreach was attempted today to offer the patient with information about available complex care management services.  Follow Up Plan:  Additional outreach attempts will be made to offer the patient complex care management information and services.   Encounter Outcome:  No Answer  Harlene Satterfield  [facility]  [facility] Guide  Direct Dial: [phone]  Fax [phone]

## 2024-10-11 NOTE — ED Triage Notes (Signed)
 Pt with abd cramping with nausea after eating with flushing of the face then chills, ongoing x 2 weeks. Pt was on amoxicillin  for sorethroat last week, c/o HA x 1 week. Diarrhea at times, dark in color per pt.

## 2024-10-11 NOTE — ED Provider Notes (Signed)
 " [place] EMERGENCY DEPARTMENT AT [facility] Provider Note   CSN: [id number] Arrival date & time: 10/11/24  1506     Patient presents with: Abdominal Pain   Karen Shields is a 34 y.o. female.   Patient is a 34 year old female who presents to the emergency department with a chief complaint of generalized abdominal cramping, nausea, intermittent diarrhea which has been ongoing for quite some time.  She notes that she has had no associated vomiting.  There is been no associated fever or chills.  She notes that she feels as though her face has been flushing.  There is been no melena or hematochezia.  She notes that her stool 2 days ago did seem darker.  She denies any associated chest pain or shortness of breath.  She has already status post cholecystectomy.   Abdominal Pain      Prior to Admission medications  Medication Sig Start Date End Date Taking? Authorizing Provider  ALPRAZolam  (XANAX ) 1 MG tablet Take 1 tablet (1 mg total) by mouth daily as needed for anxiety. 07/13/24   Okey Barnie SAUNDERS, MD  budesonide -formoterol  (SYMBICORT ) 80-4.5 MCG/ACT inhaler Inhale 2 puffs into the lungs 2 (two) times daily. 08/09/22   Bacchus, Meade PEDLAR, FNP  cetirizine (ZYRTEC) 10 MG tablet Take 10 mg by mouth daily as needed.    [provider]  clindamycin  (CLEOCIN ) 300 MG capsule Take 1 capsule (300 mg total) by mouth 4 (four) times daily. X 7 days 03/25/24   Suzette Pac, MD  FLUoxetine  (PROZAC ) 20 MG capsule Take 1 capsule (20 mg total) by mouth daily. 07/13/24 07/13/25  Okey Barnie SAUNDERS, MD  gabapentin  (NEURONTIN ) 300 MG capsule Take 1 capsule (300 mg total) by mouth at bedtime as needed. 08/12/23   Del Wilhelmena Lloyd Sola, FNP  montelukast  (SINGULAIR ) 10 MG tablet Take 1 tablet (10 mg total) by mouth daily. Patient not taking: Reported on 08/12/2023 08/29/22 08/29/23  Ricky Fines, MD  nystatin  cream (MYCOSTATIN ) Apply topically 2 (two) times daily. 08/09/22   [provider]  nystatin -triamcinolone  ointment (MYCOLOG) Apply 1 Application topically 2 (two) times daily. 08/09/22   Bacchus, Meade PEDLAR, FNP  ondansetron  (ZOFRAN -ODT) 4 MG disintegrating tablet TAKE ONE TABLET BY MOUTH EVERY 8 HOURS AS NEEDED 11/23/23   Bacchus, Meade PEDLAR, FNP  phenazopyridine  (PYRIDIUM ) 200 MG tablet Take 1 tablet (200 mg total) by mouth 3 (three) times daily. Patient not taking: Reported on 08/12/2023 06/07/23   Midge Golas, MD  polyethylene glycol (MIRALAX  / GLYCOLAX ) 17 g packet Take 17 g by mouth daily. 06/07/23   Midge Golas, MD  SUBOXONE 8-2 MG FILM Place 8 mg of opioid under the tongue 2 (two) times daily. 08/09/23   [provider]  VENTOLIN  HFA 108 (90 Base) MCG/ACT inhaler INHALE TWO PUFFS INTO LUNGS FOUR TIMES DAILY 03/17/23   Bacchus, Meade PEDLAR, FNP  Vitamin D , Ergocalciferol , (DRISDOL ) 1.25 MG (50000 UNIT) CAPS capsule Take 1 capsule (50,000 Units total) by mouth every 7 (seven) days. 05/15/23   Bacchus, Meade PEDLAR, FNP    Allergies: Aloe and Latex    Review of Systems  Gastrointestinal:  Positive for abdominal pain.  All other systems reviewed and are negative.   Updated Vital Signs BP (!) 160/105 (BP Location: Right Arm)   Pulse 97   Temp 98.3 F (36.8 C)   Resp 18   Ht 5' 3 (1.6 m)   Wt 77.1 kg   LMP 09/17/2024 (Approximate)   SpO2 97%  BMI 30.11 kg/m   Physical Exam Vitals and nursing note reviewed.  Constitutional:      General: She is not in acute distress.    Appearance: Normal appearance. She is not ill-appearing.  HENT:     Head: Normocephalic and atraumatic.     Nose: Nose normal.     Mouth/Throat:     Mouth: Mucous membranes are moist.  Eyes:     Extraocular Movements: Extraocular movements intact.     Conjunctiva/sclera: Conjunctivae normal.     Pupils: Pupils are equal, round, and reactive to light.  Cardiovascular:     Rate and Rhythm: Normal rate and regular rhythm.     Pulses: Normal pulses.     Heart sounds:  Normal heart sounds. No murmur heard.    No gallop.  Pulmonary:     Effort: Pulmonary effort is normal. No respiratory distress.     Breath sounds: Normal breath sounds. No stridor. No wheezing, rhonchi or rales.  Abdominal:     General: Abdomen is flat. Bowel sounds are normal. There is no distension.     Palpations: Abdomen is soft.     Tenderness: There is abdominal tenderness in the periumbilical area.     Hernia: No hernia is present.  Musculoskeletal:        General: Normal range of motion.     Cervical back: Normal range of motion and neck supple.  Skin:    General: Skin is warm and dry.  Neurological:     General: No focal deficit present.     Mental Status: She is alert and oriented to person, place, and time. Mental status is at baseline.  Psychiatric:        Mood and Affect: Mood normal.        Behavior: Behavior normal.        Thought Content: Thought content normal.        Judgment: Judgment normal.     (all labs ordered are listed, but only abnormal results are displayed) Labs Reviewed  COMPREHENSIVE METABOLIC PANEL WITH GFR - Abnormal; Notable for the following components:      Result Value   Potassium 3.2 (*)    Glucose, Bld 100 (*)    BUN <5 (*)    All other components within normal limits  CBC - Abnormal; Notable for the following components:   Platelets 411 (*)    All other components within normal limits  LIPASE, BLOOD  URINALYSIS, ROUTINE W REFLEX MICROSCOPIC  PREGNANCY, URINE  HCG, QUANTITATIVE, PREGNANCY  MAGNESIUM     EKG: None  Radiology: No results found.   Procedures   Medications Ordered in the ED  sodium chloride  0.9 % bolus 1,000 mL (has no administration in time range)  potassium chloride  SA (KLOR-CON  M) CR tablet 40 mEq (has no administration in time range)  hyoscyamine  (LEVSIN  SL) SL tablet 0.125 mg (has no administration in time range)    Clinical Course as of 10/11/24 1902  Thu Oct 11, 2024  1840 Patient did return from CT  scan and notes that she began feeling short of breath and having sensation of difficulty swallowing following her CT scan with IV contrast.  She denies any history of this in the past with CT scans.  She has no stridor or wheezing at this point and is tolerating oral secretions.  She has no rash.  Will give Benadryl  and Solu-Medrol  and monitor. [CR]    Clinical Course User Index [CR] Daralene Lonni BIRCH, PA-C  Medical Decision Making Amount and/or Complexity of Data Reviewed Labs: ordered. Radiology: ordered.  Risk Prescription drug management.   This patient presents to the ED for concern of abdominal pain, nausea, changes in bowel habits differential diagnosis includes colitis, gastroenteritis, ulcerative colitis, Crohn's, IBS, acute appendicitis, cholecystitis, small bowel obstruction, diverticulitis, ovarian torsion or cyst, PID, tubo-ovarian abscess, pyelonephritis, kidney stone, pancreatitis, mesenteric ischemia    Additional history obtained:  Additional history obtained from medical records External records from outside source obtained and reviewed including medical records   Lab Tests:  I Ordered, and personally interpreted labs.  The pertinent results include: No leukocytosis, no anemia, mild hypokalemia, normal kidney function liver function, unremarkable urinalysis, normal magnesium , normal lipase   Imaging Studies ordered:  I ordered imaging studies including CT scan abdomen and pelvis I independently visualized and interpreted imaging which showed decompressed bowel versus minimal inflammatory changes within the rectosigmoid colon, left-sided ovarian cyst I agree with the radiologist interpretation   Medicines ordered and prescription drug management:  I ordered medication including Levsin , potassium, Solu-Medrol , Benadryl , IV fluids for abdominal pain Reevaluation of the patient after these medicines showed that the patient  improved I have reviewed the patients home medicines and have made adjustments as needed   Problem List / ED Course:  Patient is doing better at this point.  Following her CT scan she did have some shortness of breath as well as difficulty with swallowing.  She was given Benadryl  as well as Solu-Medrol  with improvement in her symptoms.  Discussed with patient that CT scan of her abdomen and pelvis demonstrates decompressed valve versus minimal inflammatory changes within the rectosigmoid colon.  Also discussed the ovarian cyst and the need for follow-up with gynecology versus her PCP who the patient states also does gynecology for continued monitoring and possible ultrasound of this.  I did discuss with patient about the need for close follow-up with gastroenterology on an outpatient basis as we cannot rule out that her symptoms may be secondary to IBS versus ulcerative colitis versus Crohn's.  She has had no melena or hematochezia.  Blood work is otherwise unremarkable at this point.  Vital signs have remained stable other than some mild hypertension.  Patient has no clinical indication for dehydration at this point.  Potassium was repleted in the emergency department.  No other acute surgical process was noted on CT scan of abdomen and pelvis.  Patient has denied any abnormal vaginal discharge or bleeding with low suspicion for underlying etiology such as PID, tubo-ovarian abscess.  Do not suspect emergent pelvic ultrasound is warranted at this time as symptoms do not appear to be secondary to ovarian torsion.  The importance of close follow-up with PCP, gynecology and gastroenterology were discussed.  Strict turn precautions were provided as well for any new or worsening symptoms.  Will sign patient out to Madelin Gentry, PA-C for reevaluation at approximately 8 PM given the possible allergic reaction.  She should be stable for discharge home at that time as long as symptoms have resolved.  Of note she did  not have any signs of anaphylaxis or angioedema.  There was no associated rash.   Social Determinants of Health:  None        Final diagnoses:  None    ED Discharge Orders     None          Daralene Lonni JONETTA DEVONNA 10/11/24 1905    Simon Lavonia SAILOR, MD 10/11/24 2001  "

## 2024-10-11 NOTE — Discharge Instructions (Addendum)
 Please follow-up with your primary care doctor or with gynecology on an outpatient basis for further evaluation of the ovarian cyst.  Also call to make an appointment with gastroenterology on an outpatient basis for further evaluation of your ongoing abdominal pain.  Return to emergency department immediately for any new or worsening symptoms.

## 2024-10-12 NOTE — Progress Notes (Signed)
 Complex Care Management Note  Care Guide Note 10/12/2024 Name: Karen Shields MRN: [id number] DOB: 1991-07-29  Karen Shields is a 34 y.o. year old female who sees Bacchus, Meade PEDLAR, FNP for primary care. I reached out to Karen Shields by phone today to offer complex care management services.  Ms. Siegmann was given information about Complex Care Management services today including:   The Complex Care Management services include support from the care team which includes your Nurse Care Manager, Clinical Social Worker, or Pharmacist.  The Complex Care Management team is here to help remove barriers to the health concerns and goals most important to you. Complex Care Management services are voluntary, and the patient may decline or stop services at any time by request to their care team member.   Complex Care Management Consent Status: Patient agreed to services and verbal consent obtained.   Follow up plan:  Telephone appointment with complex care management team member scheduled for:  10/19/24  Encounter Outcome:  Patient Scheduled  Harlene Satterfield  [facility]  [facility] Guide  Direct Dial: [phone]  Fax [phone]

## 2024-10-15 ENCOUNTER — Ambulatory Visit (HOSPITAL_COMMUNITY): Admitting: Psychiatry

## 2024-10-16 ENCOUNTER — Ambulatory Visit: Payer: Self-pay

## 2024-10-16 ENCOUNTER — Ambulatory Visit (HOSPITAL_COMMUNITY): Admitting: Psychiatry

## 2024-10-16 DIAGNOSIS — F329 Major depressive disorder, single episode, unspecified: Secondary | ICD-10-CM

## 2024-10-16 DIAGNOSIS — F4310 Post-traumatic stress disorder, unspecified: Secondary | ICD-10-CM

## 2024-10-16 NOTE — Telephone Encounter (Signed)
 FYI Only or Action Required?: FYI only for provider: ED advised. Also needs appt for recent ED f/u Patient was last seen in primary care on 08/12/2023 by Karen Wilhelmena Lloyd Hilario, Karen Shields.  Called Nurse Triage reporting Bloated.  Symptoms began several days ago.  Interventions attempted: Nothing.  Symptoms are: gradually worsening.  Triage Disposition: Go to ED Now (Notify PCP)  Patient/caregiver understands and will follow disposition?: Unsure  Reason for Triage: was in ER on the 22nd with high blood pressure, they advised of a colon blockage, is now more bloated and having frequent headaches, having numbness in right hand, also having some dizziness    Reason for Disposition  [1] MODERATE-SEVERE SWELLING of abdomen (e.g., looks very distended or swollen) AND [2] NEW-onset or much worse AND [3] vomiting  Answer Assessment - Initial Assessment Questions 1. SYMPTOM: What's the main symptom you're concerned about? (e.g., abdomen bloating, swelling)     bloating 2. ONSET: When did bloating  start?     2 days ago after taking metamucil 3. SEVERITY: How bad is the bloating or swelling?    Moderate-severe 6. GI HISTORY: Do you have any history of stomach or intestine problems? (e.g., bowel obstruction, cancer, irritable bowel)      Was told she has a blockage by the ED, 7. CAUSE: What do you think is causing the bloating?      blockage 8. OTHER SYMPTOMS: Do you have any other symptoms? (e.g., belching, blood in stool, breathing difficulty, constipation, diarrhea, fever, passing gas, vomiting, weight loss, white of eyes have turned yellow)     Pt denies passing gas, pt states that she has not had but a small BM this morning in over a week, BM today was hard and small.   Pt was seen 1/22 for dizziness, nausea, abd discomfort, and HA. Pt states that the HA have not changed. Pt initially states that the dizziness is the same, but near end of NT states that the dizziness is worse.  Pt states that she has been having numbness to her arm from the shoulder to the R hand, but states that she is not having this numbness today. Pt states that she noted the bloating 2 days ago after taking metamucil. Pt states that she is nauseous. Pt admits to drinking about 8 oz of water  per day, RN educated pt on proper hydration. Pt was advised to return to the ED. Pt would like to schedule with PCP for the OB/GYN portion of the ED f/u. Please call pt back to schedule with PCP.  Protocols used: Abdomen Bloating and Swelling-A-AH
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
## 2024-10-17 ENCOUNTER — Emergency Department (HOSPITAL_COMMUNITY)

## 2024-10-17 ENCOUNTER — Emergency Department (HOSPITAL_COMMUNITY)
Admission: EM | Admit: 2024-10-17 | Discharge: 2024-10-17 | Disposition: A | Discharge status: Home / Self Care | Attending: Emergency Medicine | Admitting: Emergency Medicine

## 2024-10-17 ENCOUNTER — Encounter (HOSPITAL_COMMUNITY): Payer: Self-pay

## 2024-10-17 ENCOUNTER — Other Ambulatory Visit: Payer: Self-pay

## 2024-10-17 DIAGNOSIS — K5900 Constipation, unspecified: Secondary | ICD-10-CM | POA: Diagnosis not present

## 2024-10-17 DIAGNOSIS — Z720 Tobacco use: Secondary | ICD-10-CM | POA: Insufficient documentation

## 2024-10-17 DIAGNOSIS — R42 Dizziness and giddiness: Secondary | ICD-10-CM | POA: Insufficient documentation

## 2024-10-17 DIAGNOSIS — E878 Other disorders of electrolyte and fluid balance, not elsewhere classified: Secondary | ICD-10-CM | POA: Diagnosis not present

## 2024-10-17 DIAGNOSIS — D72829 Elevated white blood cell count, unspecified: Secondary | ICD-10-CM | POA: Insufficient documentation

## 2024-10-17 DIAGNOSIS — Z91040 Latex allergy status: Secondary | ICD-10-CM | POA: Insufficient documentation

## 2024-10-17 LAB — CBC WITH DIFFERENTIAL/PLATELET
Abs Immature Granulocytes: 0.05 10*3/uL (ref 0.00–0.07)
Basophils Absolute: 0 10*3/uL (ref 0.0–0.1)
Basophils Relative: 0 %
Eosinophils Absolute: 0.3 10*3/uL (ref 0.0–0.5)
Eosinophils Relative: 2 %
HCT: 44.3 % (ref 36.0–46.0)
Hemoglobin: 15.2 g/dL — ABNORMAL HIGH (ref 12.0–15.0)
Immature Granulocytes: 0 %
Lymphocytes Relative: 22 %
Lymphs Abs: 2.8 10*3/uL (ref 0.7–4.0)
MCH: 29.4 pg (ref 26.0–34.0)
MCHC: 34.3 g/dL (ref 30.0–36.0)
MCV: 85.7 fL (ref 80.0–100.0)
Monocytes Absolute: 0.8 10*3/uL (ref 0.1–1.0)
Monocytes Relative: 6 %
Neutro Abs: 9.1 10*3/uL — ABNORMAL HIGH (ref 1.7–7.7)
Neutrophils Relative %: 70 %
Platelets: 480 10*3/uL — ABNORMAL HIGH (ref 150–400)
RBC: 5.17 MIL/uL — ABNORMAL HIGH (ref 3.87–5.11)
RDW: 12.2 % (ref 11.5–15.5)
WBC: 13.1 10*3/uL — ABNORMAL HIGH (ref 4.0–10.5)
nRBC: 0 % (ref 0.0–0.2)

## 2024-10-17 LAB — BASIC METABOLIC PANEL WITH GFR
Anion gap: 15 (ref 5–15)
BUN: 7 mg/dL (ref 6–20)
CO2: 21 mmol/L — ABNORMAL LOW (ref 22–32)
Calcium: 9.9 mg/dL (ref 8.9–10.3)
Chloride: 102 mmol/L (ref 98–111)
Creatinine, Ser: 0.78 mg/dL (ref 0.44–1.00)
GFR, Estimated: >60 mL/min
Glucose, Bld: 102 mg/dL — ABNORMAL HIGH (ref 70–99)
Potassium: 3.7 mmol/L (ref 3.5–5.1)
Sodium: 138 mmol/L (ref 135–145)

## 2024-10-17 NOTE — Telephone Encounter (Signed)
 Called patient left voicemail to call our office to schedule an appt

## 2024-10-17 NOTE — ED Notes (Signed)
 Patient transported to CT

## 2024-10-17 NOTE — ED Provider Notes (Signed)
 " [place] EMERGENCY DEPARTMENT AT [facility] Provider Note   CSN: [id number] Arrival date & time: 10/17/24  1433     Patient presents with: Weakness   Karen Shields is a 34 y.o. female.  He is here with a complaint of continued dizziness and constipation.  She was seen about a week ago.  Her potassium was mildly low.  She had a CAT scan of her abdomen and pelvis followed by some possible allergic reaction was given steroids and Benadryl .  That identified decompressed versus minimal colitis and an adnexal cyst.  She was discharged with plans to follow-up with GI and her PCP and GYN.  She has made appointments for these but her dizziness worsened.  Still constipated.  When she tries to move her bowels it causes her to be more dizzy and diaphoretic.  {Add pertinent medical, surgical, social history, OB history to HPI:32947} HPI     Prior to Admission medications  Medication Sig Start Date End Date Taking? Authorizing Provider  ALPRAZolam  (XANAX ) 1 MG tablet Take 1 tablet (1 mg total) by mouth daily as needed for anxiety. 07/13/24   Okey Barnie SAUNDERS, MD  budesonide -formoterol  (SYMBICORT ) 80-4.5 MCG/ACT inhaler Inhale 2 puffs into the lungs 2 (two) times daily. 08/09/22   Bacchus, Meade PEDLAR, FNP  cetirizine (ZYRTEC) 10 MG tablet Take 10 mg by mouth daily as needed.    [provider]  clindamycin  (CLEOCIN ) 300 MG capsule Take 1 capsule (300 mg total) by mouth 4 (four) times daily. X 7 days 03/25/24   Suzette Pac, MD  dicyclomine  (BENTYL ) 20 MG tablet Take 1 tablet (20 mg total) by mouth 2 (two) times daily. 10/11/24   Daralene Lonni BIRCH, PA-C  FLUoxetine  (PROZAC ) 20 MG capsule Take 1 capsule (20 mg total) by mouth daily. 07/13/24 07/13/25  Okey Barnie SAUNDERS, MD  gabapentin  (NEURONTIN ) 300 MG capsule Take 1 capsule (300 mg total) by mouth at bedtime as needed. 08/12/23   Del Orbe Polanco, Iliana, FNP  montelukast  (SINGULAIR ) 10 MG tablet Take 1 tablet (10 mg total) by  mouth daily. Patient not taking: Reported on 08/12/2023 08/29/22 08/29/23  Ricky Fines, MD  nystatin  cream (MYCOSTATIN ) Apply topically 2 (two) times daily. 08/09/22   [provider]  nystatin -triamcinolone  ointment (MYCOLOG) Apply 1 Application topically 2 (two) times daily. 08/09/22   Bacchus, Meade PEDLAR, FNP  ondansetron  (ZOFRAN -ODT) 4 MG disintegrating tablet Take 1 tablet (4 mg total) by mouth every 8 (eight) hours as needed for nausea or vomiting. 10/11/24   Daralene Lonni BIRCH, PA-C  pantoprazole  (PROTONIX ) 20 MG tablet Take 1 tablet (20 mg total) by mouth daily. 10/11/24   Daralene Lonni BIRCH, PA-C  phenazopyridine  (PYRIDIUM ) 200 MG tablet Take 1 tablet (200 mg total) by mouth 3 (three) times daily. Patient not taking: Reported on 08/12/2023 06/07/23   Midge Golas, MD  polyethylene glycol (MIRALAX  / GLYCOLAX ) 17 g packet Take 17 g by mouth daily. 06/07/23   Midge Golas, MD  SUBOXONE 8-2 MG FILM Place 8 mg of opioid under the tongue 2 (two) times daily. 08/09/23   [provider]  VENTOLIN  HFA 108 (90 Base) MCG/ACT inhaler INHALE TWO PUFFS INTO LUNGS FOUR TIMES DAILY 03/17/23   Bacchus, Meade PEDLAR, FNP  Vitamin D , Ergocalciferol , (DRISDOL ) 1.25 MG (50000 UNIT) CAPS capsule Take 1 capsule (50,000 Units total) by mouth every 7 (seven) days. 05/15/23   Bacchus, Meade PEDLAR, FNP    Allergies: Aloe and Latex    Review of  Systems  Updated Vital Signs BP 122/88 (BP Location: Right Arm)   Pulse (!) 113   Temp 97.7 F (36.5 C)   Resp 16   Wt 77.1 kg   LMP 09/17/2024 (Approximate)   SpO2 98%   BMI 30.11 kg/m   Physical Exam  (all labs ordered are listed, but only abnormal results are displayed) Labs Reviewed  CBC WITH DIFFERENTIAL/PLATELET - Abnormal; Notable for the following components:      Result Value   WBC 13.1 (*)    RBC 5.17 (*)    Hemoglobin 15.2 (*)    Platelets 480 (*)    Neutro Abs 9.1 (*)    All other components within normal limits  BASIC  METABOLIC PANEL WITH GFR - Abnormal; Notable for the following components:   CO2 21 (*)    Glucose, Bld 102 (*)    All other components within normal limits    EKG: None  Radiology: No results found.  {Document cardiac monitor, telemetry assessment procedure when appropriate:32947} Procedures   Medications Ordered in the ED - No data to display    {Click here for ABCD2, HEART and other calculators REFRESH Note before signing:1}                              Medical Decision Making Amount and/or Complexity of Data Reviewed Labs: ordered.   ***  {Document critical care time when appropriate  Document review of labs and clinical decision tools ie CHADS2VASC2, etc  Document your independent review of radiology images and any outside records  Document your discussion with family members, caretakers and with consultants  Document social determinants of health affecting pt's care  Document your decision making why or why not admission, treatments were needed:32947:::1}   Final diagnoses:  None    ED Discharge Orders     None        "

## 2024-10-17 NOTE — Discharge Instructions (Signed)
 Follow-up with your primary care doctor and GI as scheduled.  Return to the emergency department if any worsening or concerning symptoms

## 2024-10-17 NOTE — ED Triage Notes (Signed)
 Pt reports she was seen last week for an ovarian cyst and low potassium and is not feeling any better.

## 2024-10-19 ENCOUNTER — Telehealth: Admitting: *Deleted

## 2024-10-19 ENCOUNTER — Other Ambulatory Visit (HOSPITAL_COMMUNITY): Payer: Self-pay | Admitting: Psychiatry

## 2024-10-19 DIAGNOSIS — F4310 Post-traumatic stress disorder, unspecified: Secondary | ICD-10-CM

## 2024-10-22 ENCOUNTER — Telehealth: Payer: Self-pay

## 2024-10-22 NOTE — Telephone Encounter (Signed)
 Call for appt

## 2024-10-22 NOTE — Progress Notes (Signed)
 Complex Care Management Care Guide Note  10/22/2024 Name: [name] MRN: [id number] DOB: [birth year]  Karen Shields is a 34 y.o. year old female who is a primary care patient of Bacchus, Gloria Z, FNP and is actively engaged with the care management team. I reached out to Karen Shields by phone today to assist with re-scheduling  with the RN Case Manager.  Follow up plan: Telephone appointment with complex care management team member scheduled for:  10/30/24 @ 9 AM  Leotis Rase [facility] Guide  Direct Dial: [phone]  Fax [phone]

## 2024-10-26 ENCOUNTER — Telehealth: Admitting: *Deleted

## 2024-10-29 ENCOUNTER — Telehealth: Admitting: *Deleted

## 2024-10-30 ENCOUNTER — Telehealth (HOSPITAL_COMMUNITY): Admitting: Psychiatry

## 2024-10-30 ENCOUNTER — Telehealth

## 2024-11-08 ENCOUNTER — Ambulatory Visit: Admitting: Internal Medicine

## 2024-11-16 ENCOUNTER — Ambulatory Visit (HOSPITAL_COMMUNITY): Admitting: Psychiatry

## 2024-11-30 ENCOUNTER — Ambulatory Visit (HOSPITAL_COMMUNITY): Admitting: Psychiatry

## 2024-12-14 ENCOUNTER — Ambulatory Visit (HOSPITAL_COMMUNITY): Admitting: Psychiatry

## 2024-12-28 ENCOUNTER — Ambulatory Visit (HOSPITAL_COMMUNITY): Admitting: Psychiatry
